# Patient Record
Sex: Female | Born: 1945
Health system: Southern US, Community
[De-identification: ages and names within clinical notes are randomized; demographics above are authoritative.]

## PROBLEM LIST (undated history)

## (undated) DIAGNOSIS — F329 Major depressive disorder, single episode, unspecified: Secondary | ICD-10-CM

## (undated) DIAGNOSIS — T4145XA Adverse effect of unspecified anesthetic, initial encounter: Secondary | ICD-10-CM

## (undated) DIAGNOSIS — R51 Headache: Secondary | ICD-10-CM

## (undated) DIAGNOSIS — C50919 Malignant neoplasm of unspecified site of unspecified female breast: Secondary | ICD-10-CM

## (undated) DIAGNOSIS — S022XXA Fracture of nasal bones, initial encounter for closed fracture: Secondary | ICD-10-CM

## (undated) DIAGNOSIS — W19XXXA Unspecified fall, initial encounter: Secondary | ICD-10-CM

## (undated) DIAGNOSIS — F32A Depression, unspecified: Secondary | ICD-10-CM

## (undated) DIAGNOSIS — T7840XA Allergy, unspecified, initial encounter: Secondary | ICD-10-CM

## (undated) DIAGNOSIS — A90 Dengue fever [classical dengue]: Secondary | ICD-10-CM

## (undated) DIAGNOSIS — R112 Nausea with vomiting, unspecified: Secondary | ICD-10-CM

## (undated) DIAGNOSIS — I10 Essential (primary) hypertension: Secondary | ICD-10-CM

## (undated) DIAGNOSIS — B54 Unspecified malaria: Secondary | ICD-10-CM

## (undated) DIAGNOSIS — F419 Anxiety disorder, unspecified: Secondary | ICD-10-CM

## (undated) DIAGNOSIS — K219 Gastro-esophageal reflux disease without esophagitis: Secondary | ICD-10-CM

## (undated) DIAGNOSIS — A0100 Typhoid fever, unspecified: Secondary | ICD-10-CM

## (undated) DIAGNOSIS — S069X9A Unspecified intracranial injury with loss of consciousness of unspecified duration, initial encounter: Secondary | ICD-10-CM

## (undated) DIAGNOSIS — R519 Headache, unspecified: Secondary | ICD-10-CM

## (undated) DIAGNOSIS — C50411 Malignant neoplasm of upper-outer quadrant of right female breast: Secondary | ICD-10-CM

## (undated) DIAGNOSIS — S060X9A Concussion with loss of consciousness of unspecified duration, initial encounter: Secondary | ICD-10-CM

## (undated) DIAGNOSIS — Z9889 Other specified postprocedural states: Secondary | ICD-10-CM

## (undated) DIAGNOSIS — S0990XA Unspecified injury of head, initial encounter: Secondary | ICD-10-CM

## (undated) DIAGNOSIS — T8859XA Other complications of anesthesia, initial encounter: Secondary | ICD-10-CM

## (undated) HISTORY — PX: NERVE SURGERY: SHX1016

## (undated) HISTORY — PX: TONSILLECTOMY: SUR1361

## (undated) HISTORY — DX: Anxiety disorder, unspecified: F41.9

## (undated) HISTORY — DX: Typhoid fever, unspecified: A01.00

## (undated) HISTORY — DX: Unspecified malaria: B54

## (undated) HISTORY — PX: OTHER SURGICAL HISTORY: SHX169

## (undated) HISTORY — DX: Major depressive disorder, single episode, unspecified: F32.9

## (undated) HISTORY — DX: Dengue fever (classical dengue): A90

## (undated) HISTORY — DX: Malignant neoplasm of upper-outer quadrant of right female breast: C50.411

## (undated) HISTORY — DX: Essential (primary) hypertension: I10

## (undated) HISTORY — PX: COLONOSCOPY W/ BIOPSIES AND POLYPECTOMY: SHX1376

## (undated) HISTORY — DX: Depression, unspecified: F32.A

---

## 1984-08-13 DIAGNOSIS — S060XAA Concussion with loss of consciousness status unknown, initial encounter: Secondary | ICD-10-CM

## 1984-08-13 DIAGNOSIS — S060X9A Concussion with loss of consciousness of unspecified duration, initial encounter: Secondary | ICD-10-CM

## 1984-08-13 HISTORY — DX: Concussion with loss of consciousness of unspecified duration, initial encounter: S06.0X9A

## 1984-08-13 HISTORY — DX: Concussion with loss of consciousness status unknown, initial encounter: S06.0XAA

## 1993-08-13 DIAGNOSIS — S0990XA Unspecified injury of head, initial encounter: Secondary | ICD-10-CM

## 1993-08-13 HISTORY — DX: Unspecified injury of head, initial encounter: S09.90XA

## 1998-08-13 DIAGNOSIS — S069X9A Unspecified intracranial injury with loss of consciousness of unspecified duration, initial encounter: Secondary | ICD-10-CM

## 1998-08-13 HISTORY — DX: Unspecified intracranial injury with loss of consciousness of unspecified duration, initial encounter: S06.9X9A

## 2000-10-29 ENCOUNTER — Encounter: Admission: RE | Admit: 2000-10-29 | Discharge: 2000-10-29 | Payer: Self-pay | Admitting: Family Medicine

## 2000-10-29 ENCOUNTER — Encounter: Payer: Self-pay | Admitting: Family Medicine

## 2004-07-07 ENCOUNTER — Emergency Department (HOSPITAL_COMMUNITY): Admission: EM | Admit: 2004-07-07 | Discharge: 2004-07-07 | Payer: Self-pay | Admitting: Family Medicine

## 2004-11-22 ENCOUNTER — Encounter: Admission: RE | Admit: 2004-11-22 | Discharge: 2004-11-22 | Payer: Self-pay | Admitting: Sports Medicine

## 2004-11-23 ENCOUNTER — Encounter: Admission: RE | Admit: 2004-11-23 | Discharge: 2004-11-23 | Payer: Self-pay | Admitting: Sports Medicine

## 2004-11-24 ENCOUNTER — Encounter: Admission: RE | Admit: 2004-11-24 | Discharge: 2004-11-24 | Payer: Self-pay | Admitting: Sports Medicine

## 2005-12-25 ENCOUNTER — Encounter: Admission: RE | Admit: 2005-12-25 | Discharge: 2005-12-25 | Payer: Self-pay | Admitting: Family Medicine

## 2012-11-27 ENCOUNTER — Ambulatory Visit (INDEPENDENT_AMBULATORY_CARE_PROVIDER_SITE_OTHER): Payer: Medicare Other | Admitting: Emergency Medicine

## 2012-11-27 VITALS — BP 144/72 | HR 82 | Temp 99.0°F | Resp 16 | Ht 65.5 in | Wt 169.0 lb

## 2012-11-27 DIAGNOSIS — J018 Other acute sinusitis: Secondary | ICD-10-CM

## 2012-11-27 DIAGNOSIS — J209 Acute bronchitis, unspecified: Secondary | ICD-10-CM

## 2012-11-27 MED ORDER — AMOXICILLIN-POT CLAVULANATE 875-125 MG PO TABS: 1.0000 | ORAL_TABLET | Freq: Two times a day (BID) | ORAL | Status: DC

## 2012-11-27 MED ORDER — PSEUDOEPHEDRINE-GUAIFENESIN ER 60-600 MG PO TB12: 1.0000 | ORAL_TABLET | Freq: Two times a day (BID) | ORAL | Status: DC

## 2012-11-27 NOTE — Progress Notes (Signed)
[facility] [address] [place] [address] [phone]  Date:  11/27/2012   Name:  Jessica Roberts   DOB:  1945/09/02   MRN:  [id number]  PCP:  No PCP Per Patient    Chief Complaint: URI   History of Present Illness:  Jessica Roberts is a 67 y.o. very pleasant female patient who presents with the following:  Ill for a week with purulent nasal drainage and nasal congestion.  No fever or chills.  Malaise and myalgia worse over past several days.  No wheezing or shortness of breath.  Has a nonproductive cough.  No nausea or vomiting. No stool change.  No improvement with over the counter medications or other home remedies. Denies other complaint or health concern today.   There is no problem list on file for this patient.   Past Medical History  Diagnosis Date  . Anxiety   . Hypertension     No past surgical history on file.  History  Substance Use Topics  . Smoking status: Not on file  . Smokeless tobacco: Not on file  . Alcohol Use: Not on file    Family History  Problem Relation Age of Onset  . Hypertension Mother   . Thyroid disease Mother   . Emphysema Father   . Diabetes Maternal Grandmother   . Heart disease Paternal Grandmother   . Alcohol abuse Paternal Grandfather     Allergies  Allergen Reactions  . Codeine Other (See Comments)    Nervous system    Medication list has been reviewed and updated.  No current outpatient prescriptions on file prior to visit.   No current facility-administered medications on file prior to visit.    Review of Systems:  As per HPI, otherwise negative.    Physical Examination: There were no vitals filed for this visit. There were no vitals filed for this visit. There is no height or weight on file to calculate BMI. Ideal Body Weight:    GEN: WDWN, NAD, Non-toxic, A & O x 3 HEENT: Atraumatic, Normocephalic. Neck supple. No masses, No LAD. Ears and Nose: No external deformity. CV:  RRR, No M/G/R. No JVD. No thrill. No extra heart sounds. PULM: CTA B, no wheezes, crackles, rhonchi. No retractions. No resp. distress. No accessory muscle use. ABD: S, NT, ND, +BS. No rebound. No HSM. EXTR: No c/c/e NEURO Normal gait.  PSYCH: Normally interactive. Conversant. Not depressed or anxious appearing.  Calm demeanor.    Assessment and Plan: Sinusitis Bronchitis augmentin mucinex d tussionex   Signed,  Phillips Odor, MD

## 2012-11-27 NOTE — Patient Instructions (Addendum)
aBronchitis Bronchitis is the body's way of reacting to injury and/or infection (inflammation) of the bronchi. Bronchi are the air tubes that extend from the windpipe into the lungs. If the inflammation becomes severe, it may cause shortness of breath. CAUSES  Inflammation may be caused by:  A virus.  Germs (bacteria).  Dust.  Allergens.  Pollutants and many other irritants. The cells lining the bronchial tree are covered with tiny hairs (cilia). These constantly beat upward, away from the lungs, toward the mouth. This keeps the lungs free of pollutants. When these cells become too irritated and are unable to do their job, mucus begins to develop. This causes the characteristic cough of bronchitis. The cough clears the lungs when the cilia are unable to do their job. Without either of these protective mechanisms, the mucus would settle in the lungs. Then you would develop pneumonia. Smoking is a common cause of bronchitis and can contribute to pneumonia. Stopping this habit is the single most important thing you can do to help yourself. TREATMENT   Your caregiver may prescribe an antibiotic if the cough is caused by bacteria. Also, medicines that open up your airways make it easier to breathe. Your caregiver may also recommend or prescribe an expectorant. It will loosen the mucus to be coughed up. Only take over-the-counter or prescription medicines for pain, discomfort, or fever as directed by your caregiver.  Removing whatever causes the problem (smoking, for example) is critical to preventing the problem from getting worse.  Cough suppressants may be prescribed for relief of cough symptoms.  Inhaled medicines may be prescribed to help with symptoms now and to help prevent problems from returning.  For those with recurrent (chronic) bronchitis, there may be a need for steroid medicines. SEEK IMMEDIATE MEDICAL CARE IF:   During treatment, you develop more pus-like mucus (purulent  sputum).  You have a fever.  Your baby is older than 3 months with a rectal temperature of 102 F (38.9 C) or higher.  Your baby is 54 months old or younger with a rectal temperature of 100.4 F (38 C) or higher.  You become progressively more ill.  You have increased difficulty breathing, wheezing, or shortness of breath. It is necessary to seek immediate medical care if you are elderly or sick from any other disease. MAKE SURE YOU:   Understand these instructions.  Will watch your condition.  Will get help right away if you are not doing well or get worse. Document Released: 07/30/2005 Document Revised: 10/22/2011 Document Reviewed: 06/08/2008 [facility] Patient Information 2013 [place], [place].

## 2013-06-26 ENCOUNTER — Emergency Department (HOSPITAL_COMMUNITY): Payer: Medicare Other

## 2013-06-26 ENCOUNTER — Emergency Department (HOSPITAL_COMMUNITY)
Admission: EM | Admit: 2013-06-26 | Discharge: 2013-06-26 | Disposition: A | Discharge status: Home / Self Care | Payer: Medicare Other | Attending: Emergency Medicine | Admitting: Emergency Medicine

## 2013-06-26 ENCOUNTER — Encounter (HOSPITAL_COMMUNITY): Payer: Self-pay | Admitting: Emergency Medicine

## 2013-06-26 DIAGNOSIS — I10 Essential (primary) hypertension: Secondary | ICD-10-CM | POA: Insufficient documentation

## 2013-06-26 DIAGNOSIS — F411 Generalized anxiety disorder: Secondary | ICD-10-CM | POA: Insufficient documentation

## 2013-06-26 DIAGNOSIS — IMO0002 Reserved for concepts with insufficient information to code with codable children: Secondary | ICD-10-CM | POA: Insufficient documentation

## 2013-06-26 DIAGNOSIS — S022XXA Fracture of nasal bones, initial encounter for closed fracture: Secondary | ICD-10-CM

## 2013-06-26 DIAGNOSIS — Y9289 Other specified places as the place of occurrence of the external cause: Secondary | ICD-10-CM | POA: Insufficient documentation

## 2013-06-26 DIAGNOSIS — R296 Repeated falls: Secondary | ICD-10-CM | POA: Insufficient documentation

## 2013-06-26 DIAGNOSIS — W19XXXA Unspecified fall, initial encounter: Secondary | ICD-10-CM

## 2013-06-26 DIAGNOSIS — S0081XA Abrasion of other part of head, initial encounter: Secondary | ICD-10-CM

## 2013-06-26 DIAGNOSIS — Z79899 Other long term (current) drug therapy: Secondary | ICD-10-CM | POA: Insufficient documentation

## 2013-06-26 DIAGNOSIS — S0990XA Unspecified injury of head, initial encounter: Secondary | ICD-10-CM | POA: Insufficient documentation

## 2013-06-26 DIAGNOSIS — S8990XA Unspecified injury of unspecified lower leg, initial encounter: Secondary | ICD-10-CM | POA: Insufficient documentation

## 2013-06-26 DIAGNOSIS — S99919A Unspecified injury of unspecified ankle, initial encounter: Secondary | ICD-10-CM | POA: Insufficient documentation

## 2013-06-26 DIAGNOSIS — Z23 Encounter for immunization: Secondary | ICD-10-CM | POA: Insufficient documentation

## 2013-06-26 DIAGNOSIS — Y9302 Activity, running: Secondary | ICD-10-CM | POA: Insufficient documentation

## 2013-06-26 MED ORDER — TETANUS-DIPHTH-ACELL PERTUSSIS 5-2.5-18.5 LF-MCG/0.5 IM SUSP
0.5000 mL | Freq: Once | INTRAMUSCULAR | Status: AC
  Administered 2013-06-26: 0.5 mL via INTRAMUSCULAR
  Filled 2013-06-26: qty 0.5

## 2013-06-26 MED ORDER — HYDROCODONE-ACETAMINOPHEN 5-325 MG PO TABS: 1.0000 | ORAL_TABLET | Freq: Four times a day (QID) | ORAL | Status: DC | PRN

## 2013-06-26 MED ORDER — HYDROCODONE-ACETAMINOPHEN 5-325 MG PO TABS
2.0000 | ORAL_TABLET | Freq: Once | ORAL | Status: AC
  Administered 2013-06-26: 1 via ORAL
  Filled 2013-06-26: qty 2

## 2013-06-26 NOTE — Progress Notes (Signed)
[facility] provided pt with a list of primary care resources and a [facility] Orange Card application.  °

## 2013-06-26 NOTE — ED Notes (Signed)
Pt transported to Xray. 

## 2013-06-26 NOTE — ED Notes (Signed)
Pt returned from X-ray.  

## 2013-06-26 NOTE — ED Provider Notes (Signed)
CSN: [id number]     Arrival date & time 06/26/13  1412 History   First MD Initiated Contact with Patient 06/26/13 1504     Chief Complaint  Patient presents with  . Fall  . Facial Injury   (Consider location/radiation/quality/duration/timing/severity/associated sxs/prior Treatment) Patient is a 67 y.o. female presenting with fall and facial injury. The history is provided by the patient.  Fall This is a new problem. The current episode started 1 to 2 hours ago. Episode frequency: once. The problem has not changed since onset.Associated symptoms include headaches. Pertinent negatives include no chest pain, no abdominal pain and no shortness of breath. Nothing aggravates the symptoms. Nothing relieves the symptoms. She has tried nothing for the symptoms.  Facial Injury Mechanism of injury:  Fall Location:  Nose and forehead Time since incident:  1 hour Pain details:    Quality:  Aching   Severity:  Moderate   Duration:  1 hour   Timing:  Constant   Progression:  Unchanged Chronicity:  New Foreign body present:  No foreign bodies Relieved by:  Nothing Associated symptoms: headaches   Associated symptoms: no altered mental status, no ear pain, no epistaxis, no neck pain and no vomiting     Past Medical History  Diagnosis Date  . Anxiety   . Hypertension    Past Surgical History  Procedure Laterality Date  . Surgical procedure for endometriosis     Family History  Problem Relation Age of Onset  . Hypertension Mother   . Thyroid disease Mother   . Emphysema Father   . Diabetes Maternal Grandmother   . Heart disease Paternal Grandmother   . Alcohol abuse Paternal Grandfather    History  Substance Use Topics  . Smoking status: Never Smoker   . Smokeless tobacco: Not on file  . Alcohol Use: 2.4 oz/week    4 Glasses of wine per week   OB History   Grav Para Term Preterm Abortions TAB SAB Ect Mult Living                 Review of Systems  Constitutional: Negative for  fever.  HENT: Negative for ear pain and nosebleeds.   Respiratory: Negative for cough and shortness of breath.   Cardiovascular: Negative for chest pain.  Gastrointestinal: Negative for vomiting and abdominal pain.  Musculoskeletal: Negative for neck pain.  Neurological: Positive for headaches.  All other systems reviewed and are negative.    Allergies  Codeine  Home Medications   Current Outpatient Rx  Name  Route  Sig  Dispense  Refill  . acetaminophen (TYLENOL) 500 MG tablet   Oral   Take 500 mg by mouth every 6 (six) hours as needed for moderate pain.          Marland Kitchen amLODipine (NORVASC) 2.5 MG tablet   Oral   Take 2.5 mg by mouth daily.         . diazepam (VALIUM) 5 MG tablet   Oral   Take 2.5 mg by mouth as needed for anxiety.         Marland Kitchen estradiol (ESTRACE) 0.5 MG tablet   Oral   Take 0.5 mg by mouth daily.         Marland Kitchen ibuprofen (ADVIL,MOTRIN) 200 MG tablet   Oral   Take 200 mg by mouth every 6 (six) hours as needed for moderate pain.          . medroxyPROGESTERone (PROVERA) 2.5 MG tablet   Oral  Take 5 mg by mouth daily.         . Multiple Vitamins-Minerals (AIRBORNE) CHEW   Oral   Chew 1 tablet by mouth daily as needed (for immune support).         . Multiple Vitamins-Minerals (ZINC PO)   Oral   Take 1 tablet by mouth daily as needed (for relaxation).         . naproxen sodium (ANAPROX) 220 MG tablet   Oral   Take 220 mg by mouth daily as needed (for pain).          Marland Kitchen sertraline (ZOLOFT) 100 MG tablet   Oral   Take 150 mg by mouth daily.         Marland Kitchen HYDROcodone-acetaminophen (NORCO/VICODIN) 5-325 MG per tablet   Oral   Take 1 tablet by mouth every 6 (six) hours as needed for moderate pain.   30 tablet   0    BP 133/67  Pulse 78  Temp(Src) 97.9 F (36.6 C) (Oral)  Resp 18  Ht 5' 6.5" (1.689 m)  Wt 169 lb (76.658 kg)  BMI 26.87 kg/m2  SpO2 96% Physical Exam  Nursing note and vitals reviewed. Constitutional: She is oriented  to person, place, and time. She appears well-developed and well-nourished. No distress.  HENT:  Head: Normocephalic.    Nose: Sinus tenderness present. No mucosal edema, rhinorrhea, nasal deformity, septal deviation or nasal septal hematoma. Right sinus exhibits no maxillary sinus tenderness and no frontal sinus tenderness. Left sinus exhibits maxillary sinus tenderness and frontal sinus tenderness.    Forehead abrasion Large nose abrasion  Eyes: EOM are normal. Pupils are equal, round, and reactive to light.  Neck: Normal range of motion. Neck supple.  Cardiovascular: Normal rate and regular rhythm.  Exam reveals no friction rub.   No murmur heard. Pulmonary/Chest: Effort normal and breath sounds normal. No respiratory distress. She has no wheezes. She has no rales.  Abdominal: Soft. She exhibits no distension. There is no tenderness. There is no rebound.  Musculoskeletal: Normal range of motion. She exhibits no edema.       Right knee: Tenderness (patella with mild abrasion) found.       Left knee: Tenderness (with mild patellar abrasion) found.       Cervical back: She exhibits no bony tenderness.       Thoracic back: She exhibits no bony tenderness.  Neurological: She is alert and oriented to person, place, and time.  Skin: She is not diaphoretic.    ED Course  Procedures (including critical care time) Labs Review Labs Reviewed - No data to display Imaging Review Ct Head Wo Contrast  06/26/2013   CLINICAL DATA:  Facial trauma status post fall.  EXAM: CT HEAD WITHOUT CONTRAST  CT MAXILLOFACIAL WITHOUT CONTRAST  TECHNIQUE: Multidetector CT imaging of the head and maxillofacial structures were performed using the standard protocol without intravenous contrast. Multiplanar CT image reconstructions of the maxillofacial structures were also generated.  COMPARISON:  None.  FINDINGS: CT HEAD FINDINGS  There is no evidence of mass effect, midline shift, or extra-axial fluid collections.  There is no evidence of a space-occupying lesion or intracranial hemorrhage. There is no evidence of a cortical-based area of acute infarction. There is generalized cerebral atrophy. There is periventricular white matter low attenuation likely secondary to microangiopathy.  The ventricles and sulci are appropriate for the patient's age. The basal cisterns are patent.  Visualized portions of the orbits are unremarkable. There is a mucous  retention cyst in the right maxillary sinus. Cerebrovascular atherosclerotic calcifications are noted.  The osseous structures are unremarkable.  CT MAXILLOFACIAL FINDINGS  The globes are intact. The orbital walls are intact. The orbital floors are intact. The maxilla is intact. The mandible is intact. The zygomatic arches are intact. The nasal septum is deviated towards the left. There are bilateral comminuted nasal bone fractures. The temporomandibular joints are normal.  There is a right maxillary sinus mucous retention cyst. The visualized portions of the mastoid sinuses are well aerated.  IMPRESSION: 1.  No acute intracranial pathology.  2.  Bilateral mildly comminuted nasal bone fractures.   Electronically Signed   By: Elige Ko   On: 06/26/2013 16:34   Dg Knee Complete 4 Views Left  06/26/2013   CLINICAL DATA:  Bilateral knee pain and abrasions  EXAM: LEFT KNEE - COMPLETE 4+ VIEW  COMPARISON:  None.  FINDINGS: There is no evidence of fracture, dislocation, or joint effusion. There is no evidence of arthropathy or other focal bone abnormality. Soft tissues are unremarkable.  IMPRESSION: No acute osseous injury of the left knee.   Electronically Signed   By: Elige Ko   On: 06/26/2013 16:23   Dg Knee Complete 4 Views Right  06/26/2013   CLINICAL DATA:  Bilateral knee pain and abrasions status post fall  EXAM: RIGHT KNEE - COMPLETE 4+ VIEW  COMPARISON:  None.  FINDINGS: There is no evidence of fracture, dislocation, or joint effusion. There is no evidence of  arthropathy or other focal bone abnormality. Soft tissues are unremarkable.  IMPRESSION: No acute osseous injury of the right knee.   Electronically Signed   By: Elige Ko   On: 06/26/2013 16:24   Ct Maxillofacial Wo Cm  06/26/2013   CLINICAL DATA:  Facial trauma status post fall.  EXAM: CT HEAD WITHOUT CONTRAST  CT MAXILLOFACIAL WITHOUT CONTRAST  TECHNIQUE: Multidetector CT imaging of the head and maxillofacial structures were performed using the standard protocol without intravenous contrast. Multiplanar CT image reconstructions of the maxillofacial structures were also generated.  COMPARISON:  None.  FINDINGS: CT HEAD FINDINGS  There is no evidence of mass effect, midline shift, or extra-axial fluid collections. There is no evidence of a space-occupying lesion or intracranial hemorrhage. There is no evidence of a cortical-based area of acute infarction. There is generalized cerebral atrophy. There is periventricular white matter low attenuation likely secondary to microangiopathy.  The ventricles and sulci are appropriate for the patient's age. The basal cisterns are patent.  Visualized portions of the orbits are unremarkable. There is a mucous retention cyst in the right maxillary sinus. Cerebrovascular atherosclerotic calcifications are noted.  The osseous structures are unremarkable.  CT MAXILLOFACIAL FINDINGS  The globes are intact. The orbital walls are intact. The orbital floors are intact. The maxilla is intact. The mandible is intact. The zygomatic arches are intact. The nasal septum is deviated towards the left. There are bilateral comminuted nasal bone fractures. The temporomandibular joints are normal.  There is a right maxillary sinus mucous retention cyst. The visualized portions of the mastoid sinuses are well aerated.  IMPRESSION: 1.  No acute intracranial pathology.  2.  Bilateral mildly comminuted nasal bone fractures.   Electronically Signed   By: Elige Ko   On: 06/26/2013 16:34     EKG Interpretation   None       MDM   1. Nasal bone fracture, closed, initial encounter   2. Fall, initial encounter   3. Facial  abrasion, initial encounter    4F s/p fall. Mechanical fall, slipped while running downhill. Landed on her face. No LOC, no N/V. Not on anticoagulants. Patient has large abrasion over anterior and L side of nose. Small divot at bridge of nose, no laceration. 3 cm x 1.5 cm abrasion in L center forehead. AFVSS here. Mild abrasions on anterior knees, otherwise no injuries. Lungs clear, no chest, abdomen, pelvis tenderness. Tetanus updated, pain meds given. Will CT Head, Face and xray bilateral knees. Scans show bilateral comminuted nasal fractures, otherwise normal.    Dagmar Hait, MD 06/26/13 1806

## 2013-06-26 NOTE — ED Notes (Addendum)
Pt c/o facial injury d/t a fall from running down a hill and landing on face. She has lacerations to forehead and nose. No LOC. No recent use of ASA or blood thinners.

## 2013-08-31 ENCOUNTER — Other Ambulatory Visit: Payer: Self-pay | Admitting: Family Medicine

## 2013-08-31 DIAGNOSIS — R413 Other amnesia: Secondary | ICD-10-CM

## 2013-09-04 ENCOUNTER — Ambulatory Visit
Admission: RE | Admit: 2013-09-04 | Discharge: 2013-09-04 | Disposition: A | Discharge status: Home / Self Care | Payer: Medicare Other | Source: Ambulatory Visit | Attending: Family Medicine | Admitting: Family Medicine

## 2013-09-04 DIAGNOSIS — R413 Other amnesia: Secondary | ICD-10-CM

## 2013-09-04 MED ORDER — GADOBENATE DIMEGLUMINE 529 MG/ML IV SOLN
15.0000 mL | Freq: Once | INTRAVENOUS | Status: AC | PRN
  Administered 2013-09-04: 15 mL via INTRAVENOUS

## 2013-09-16 ENCOUNTER — Telehealth: Payer: Self-pay

## 2013-09-16 ENCOUNTER — Encounter: Payer: Self-pay | Admitting: Diagnostic Neuroimaging

## 2013-09-16 ENCOUNTER — Ambulatory Visit (INDEPENDENT_AMBULATORY_CARE_PROVIDER_SITE_OTHER): Payer: Medicare Other | Admitting: Diagnostic Neuroimaging

## 2013-09-16 ENCOUNTER — Encounter (INDEPENDENT_AMBULATORY_CARE_PROVIDER_SITE_OTHER): Payer: Self-pay

## 2013-09-16 VITALS — BP 130/80 | HR 74 | Temp 97.9°F | Ht 66.0 in | Wt 182.0 lb

## 2013-09-16 DIAGNOSIS — R413 Other amnesia: Secondary | ICD-10-CM

## 2013-09-16 DIAGNOSIS — R9089 Other abnormal findings on diagnostic imaging of central nervous system: Secondary | ICD-10-CM

## 2013-09-16 DIAGNOSIS — R930 Abnormal findings on diagnostic imaging of skull and head, not elsewhere classified: Secondary | ICD-10-CM

## 2013-09-16 DIAGNOSIS — F09 Unspecified mental disorder due to known physiological condition: Secondary | ICD-10-CM

## 2013-09-16 DIAGNOSIS — R42 Dizziness and giddiness: Secondary | ICD-10-CM

## 2013-09-16 NOTE — Telephone Encounter (Signed)
Called patient twice to schedule f/u in 4 weeks. Vmail states not to leave a vmail but call back. Will try later.
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
## 2013-09-16 NOTE — Patient Instructions (Signed)
I will check additional testing. 

## 2013-09-17 LAB — PAN-ANCA
C-ANCA: <1:20 {titer}
Myeloperoxidase Ab: <9 U/mL (ref 0.0–9.0)

## 2013-09-17 LAB — VITAMIN B12: Vitamin B-12: 299 pg/mL (ref 211–946)

## 2013-09-17 LAB — ANGIOTENSIN CONVERTING ENZYME: Angio Convert Enzyme: 40 U/L (ref 14–82)

## 2013-09-21 NOTE — Telephone Encounter (Signed)
Spoke to patient. Sched appt per work/school schedule.

## 2013-09-22 ENCOUNTER — Other Ambulatory Visit: Payer: Medicare Other

## 2013-09-22 ENCOUNTER — Inpatient Hospital Stay: Admission: RE | Admit: 2013-09-22 | Payer: Medicare Other | Source: Ambulatory Visit

## 2013-09-25 ENCOUNTER — Ambulatory Visit (INDEPENDENT_AMBULATORY_CARE_PROVIDER_SITE_OTHER): Payer: Medicare Other

## 2013-09-25 DIAGNOSIS — R413 Other amnesia: Secondary | ICD-10-CM

## 2013-09-25 DIAGNOSIS — H5310 Unspecified subjective visual disturbances: Secondary | ICD-10-CM

## 2013-09-25 DIAGNOSIS — R9089 Other abnormal findings on diagnostic imaging of central nervous system: Secondary | ICD-10-CM

## 2013-09-25 DIAGNOSIS — F09 Unspecified mental disorder due to known physiological condition: Secondary | ICD-10-CM

## 2013-09-25 DIAGNOSIS — R42 Dizziness and giddiness: Secondary | ICD-10-CM

## 2013-09-28 ENCOUNTER — Inpatient Hospital Stay: Admission: RE | Admit: 2013-09-28 | Payer: Medicare Other | Source: Ambulatory Visit

## 2013-09-28 ENCOUNTER — Other Ambulatory Visit: Payer: Medicare Other

## 2013-09-29 NOTE — Procedures (Signed)
   [facility]  VEP (VISUAL EVOKED POTENTIAL) REPORT   STUDY DATE: 09/25/13 PATIENT NAME: Jessica Roberts DOB: Dec 22, 1945 MRN: [id number]  ORDERING CLINICIAN: Andrey Spearman, MD   TECHNOLOGIST: Laretta Alstrom TECHNIQUE: The visual evoked potential test was performed using 32 x 32 check sizes with full pattern reversal. CLINICAL INFORMATION: 68 year old female with abnormal MRI.  FINDINGS: The visual acuity was 20/30 OD and 20/30 OS.  There are well formed evoked potential wave forms bilaterally.   P100 latency with right eye stimulation: 102 ms.   P100 latency with left eye stimulation: 103 ms.  The amplitudes for the P100 waveforms were also within normal limits bilaterally.   IMPRESSION: This visual evoked potential study is normal. No definite evidence of conduction problems of the visual pathways at this time.   INTERPRETING PHYSICIAN:  Penni Bombard, MD Certified in Neurology, Neurophysiology and Neuroimaging  [facility] [address] [place], [place] [address] [phone]

## 2013-10-08 ENCOUNTER — Ambulatory Visit
Admission: RE | Admit: 2013-10-08 | Discharge: 2013-10-08 | Disposition: A | Discharge status: Home / Self Care | Payer: Medicare Other | Source: Ambulatory Visit | Attending: Diagnostic Neuroimaging | Admitting: Diagnostic Neuroimaging

## 2013-10-08 DIAGNOSIS — R42 Dizziness and giddiness: Secondary | ICD-10-CM

## 2013-10-08 DIAGNOSIS — R413 Other amnesia: Secondary | ICD-10-CM

## 2013-10-08 DIAGNOSIS — F09 Unspecified mental disorder due to known physiological condition: Secondary | ICD-10-CM

## 2013-10-08 DIAGNOSIS — R930 Abnormal findings on diagnostic imaging of skull and head, not elsewhere classified: Secondary | ICD-10-CM

## 2013-10-08 DIAGNOSIS — R9089 Other abnormal findings on diagnostic imaging of central nervous system: Secondary | ICD-10-CM

## 2013-10-08 MED ORDER — GADOBENATE DIMEGLUMINE 529 MG/ML IV SOLN
15.0000 mL | Freq: Once | INTRAVENOUS | Status: AC | PRN
  Administered 2013-10-08: 15 mL via INTRAVENOUS

## 2013-10-14 ENCOUNTER — Encounter: Payer: Self-pay | Admitting: Diagnostic Neuroimaging

## 2013-10-14 ENCOUNTER — Ambulatory Visit (INDEPENDENT_AMBULATORY_CARE_PROVIDER_SITE_OTHER): Payer: Medicare Other | Admitting: Diagnostic Neuroimaging

## 2013-10-14 VITALS — BP 121/74 | HR 92 | Ht 66.0 in

## 2013-10-14 DIAGNOSIS — R42 Dizziness and giddiness: Secondary | ICD-10-CM

## 2013-10-14 DIAGNOSIS — R930 Abnormal findings on diagnostic imaging of skull and head, not elsewhere classified: Secondary | ICD-10-CM

## 2013-10-14 DIAGNOSIS — R413 Other amnesia: Secondary | ICD-10-CM

## 2013-10-14 DIAGNOSIS — R9089 Other abnormal findings on diagnostic imaging of central nervous system: Secondary | ICD-10-CM
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
## 2013-10-14 NOTE — Patient Instructions (Signed)
I will setup lumbar puncture.

## 2013-10-20 ENCOUNTER — Ambulatory Visit
Admission: RE | Admit: 2013-10-20 | Discharge: 2013-10-20 | Disposition: A | Discharge status: Home / Self Care | Payer: Medicare Other | Source: Ambulatory Visit | Attending: Diagnostic Neuroimaging | Admitting: Diagnostic Neuroimaging

## 2013-10-20 VITALS — BP 141/68 | HR 69

## 2013-10-20 DIAGNOSIS — R42 Dizziness and giddiness: Secondary | ICD-10-CM

## 2013-10-20 DIAGNOSIS — R9089 Other abnormal findings on diagnostic imaging of central nervous system: Secondary | ICD-10-CM

## 2013-10-20 DIAGNOSIS — R413 Other amnesia: Secondary | ICD-10-CM

## 2013-10-20 DIAGNOSIS — R839 Unspecified abnormal finding in cerebrospinal fluid: Secondary | ICD-10-CM

## 2013-10-20 LAB — CSF CELL COUNT WITH DIFFERENTIAL
RBC COUNT CSF: 0 uL
Tube #: 1
WBC, CSF: 0 cu mm (ref 0–5)

## 2013-10-20 LAB — PROTEIN, CSF: Total Protein, CSF: 40 mg/dL (ref 15–45)

## 2013-10-20 LAB — GLUCOSE, CSF: Glucose, CSF: 57 mg/dL (ref 43–76)

## 2013-10-20 MED ORDER — IOHEXOL 180 MG/ML  SOLN: 1.0000 mL | Freq: Once | INTRAMUSCULAR | Status: AC | PRN

## 2013-10-20 MED ORDER — METHYLPREDNISOLONE ACETATE 40 MG/ML INJ SUSP (RADIOLOG: 120.0000 mg | Freq: Once | INTRAMUSCULAR | Status: DC

## 2013-10-20 NOTE — Discharge Instructions (Signed)

## 2013-10-20 NOTE — Progress Notes (Signed)
Blood drawn to go with spinal fluid. 2 vials obtained from right AC. Site is unremarkable and discharge instructions explained.

## 2013-10-21 LAB — GRAM STAIN
GRAM STAIN: NONE SEEN
Gram Stain: NONE SEEN
Gram Stain: NONE SEEN

## 2013-10-22 LAB — ANGIOTENSIN CONVERTING ENZYME, CSF: ACE, CSF: 5 U/L (ref <=15)

## 2013-10-23 LAB — CSF CULTURE: ORGANISM ID, BACTERIA: NO GROWTH

## 2013-10-23 LAB — CSF CULTURE W GRAM STAIN
Gram Stain: NONE SEEN
Gram Stain: NONE SEEN

## 2013-10-27 ENCOUNTER — Telehealth: Payer: Self-pay | Admitting: Diagnostic Neuroimaging

## 2013-10-27 LAB — HSV(HERPES SMPLX VRS)ABS-I+II(IGG)-CSF

## 2013-10-27 NOTE — Telephone Encounter (Signed)
Called pt to advise of a cancellation, scheduled pt for 10/29/13.

## 2013-10-27 NOTE — Telephone Encounter (Signed)
Pt is calling needing to get an apt soon to discuss her test results and her diagnosis from Lumbar test and other test performed on pt.  Please call pt concerning this matter.

## 2013-10-28 LAB — OLIGOCLONAL BANDS, CSF + SERM

## 2013-10-29 ENCOUNTER — Encounter (INDEPENDENT_AMBULATORY_CARE_PROVIDER_SITE_OTHER): Payer: Self-pay

## 2013-10-29 ENCOUNTER — Encounter: Payer: Self-pay | Admitting: Diagnostic Neuroimaging

## 2013-10-29 ENCOUNTER — Ambulatory Visit (INDEPENDENT_AMBULATORY_CARE_PROVIDER_SITE_OTHER): Payer: Medicare Other | Admitting: Diagnostic Neuroimaging

## 2013-10-29 VITALS — BP 122/74 | HR 75 | Temp 97.6°F | Ht 66.0 in | Wt 181.5 lb

## 2013-10-29 DIAGNOSIS — R413 Other amnesia: Secondary | ICD-10-CM

## 2013-10-29 DIAGNOSIS — R42 Dizziness and giddiness: Secondary | ICD-10-CM
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
## 2013-10-29 NOTE — Patient Instructions (Signed)
Keep up the good work. I think you are on the right track.

## 2014-06-08 ENCOUNTER — Other Ambulatory Visit: Payer: Self-pay | Admitting: Family Medicine

## 2014-06-08 DIAGNOSIS — Z1239 Encounter for other screening for malignant neoplasm of breast: Secondary | ICD-10-CM

## 2014-11-01 ENCOUNTER — Encounter: Payer: Self-pay | Admitting: Diagnostic Neuroimaging

## 2014-11-01 ENCOUNTER — Ambulatory Visit (INDEPENDENT_AMBULATORY_CARE_PROVIDER_SITE_OTHER): Payer: Medicare Other | Admitting: Diagnostic Neuroimaging

## 2014-11-01 VITALS — BP 120/73 | HR 82 | Temp 97.1°F | Ht 66.0 in | Wt 178.6 lb

## 2014-11-01 DIAGNOSIS — R42 Dizziness and giddiness: Secondary | ICD-10-CM
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
## 2014-11-01 NOTE — Patient Instructions (Signed)
Follow up as needed

## 2015-02-09 ENCOUNTER — Other Ambulatory Visit: Payer: Self-pay | Admitting: Family Medicine

## 2015-02-09 DIAGNOSIS — Z803 Family history of malignant neoplasm of breast: Secondary | ICD-10-CM

## 2015-02-09 DIAGNOSIS — Z1239 Encounter for other screening for malignant neoplasm of breast: Secondary | ICD-10-CM

## 2015-03-14 DIAGNOSIS — Z853 Personal history of malignant neoplasm of breast: Secondary | ICD-10-CM | POA: Insufficient documentation

## 2015-03-21 ENCOUNTER — Ambulatory Visit
Admission: RE | Admit: 2015-03-21 | Discharge: 2015-03-21 | Disposition: A | Discharge status: Home / Self Care | Payer: Medicare Other | Source: Ambulatory Visit | Attending: Family Medicine | Admitting: Family Medicine

## 2015-03-21 DIAGNOSIS — Z1239 Encounter for other screening for malignant neoplasm of breast: Secondary | ICD-10-CM

## 2015-03-21 DIAGNOSIS — Z803 Family history of malignant neoplasm of breast: Secondary | ICD-10-CM

## 2015-03-24 ENCOUNTER — Other Ambulatory Visit: Payer: Self-pay | Admitting: Family Medicine

## 2015-03-24 DIAGNOSIS — R928 Other abnormal and inconclusive findings on diagnostic imaging of breast: Secondary | ICD-10-CM

## 2015-03-25 ENCOUNTER — Other Ambulatory Visit (HOSPITAL_COMMUNITY)
Admission: RE | Admit: 2015-03-25 | Discharge: 2015-03-25 | Disposition: A | Discharge status: Home / Self Care | Payer: Medicare Other | Source: Ambulatory Visit | Attending: Family Medicine | Admitting: Family Medicine

## 2015-03-25 ENCOUNTER — Other Ambulatory Visit: Payer: Self-pay | Admitting: Family Medicine

## 2015-03-25 DIAGNOSIS — Z124 Encounter for screening for malignant neoplasm of cervix: Secondary | ICD-10-CM | POA: Insufficient documentation

## 2015-03-29 LAB — CYTOLOGY - PAP

## 2015-04-01 ENCOUNTER — Other Ambulatory Visit: Payer: Self-pay | Admitting: Family Medicine

## 2015-04-01 ENCOUNTER — Ambulatory Visit
Admission: RE | Admit: 2015-04-01 | Discharge: 2015-04-01 | Disposition: A | Discharge status: Home / Self Care | Payer: Medicare Other | Source: Ambulatory Visit | Attending: Family Medicine | Admitting: Family Medicine

## 2015-04-01 DIAGNOSIS — R928 Other abnormal and inconclusive findings on diagnostic imaging of breast: Secondary | ICD-10-CM

## 2015-04-01 DIAGNOSIS — N6310 Unspecified lump in the right breast, unspecified quadrant: Secondary | ICD-10-CM

## 2015-04-05 ENCOUNTER — Other Ambulatory Visit: Payer: Medicare Other

## 2015-04-07 ENCOUNTER — Other Ambulatory Visit: Payer: Self-pay | Admitting: Family Medicine

## 2015-04-07 ENCOUNTER — Ambulatory Visit
Admission: RE | Admit: 2015-04-07 | Discharge: 2015-04-07 | Disposition: A | Discharge status: Home / Self Care | Payer: Medicare Other | Source: Ambulatory Visit | Attending: Family Medicine | Admitting: Family Medicine

## 2015-04-07 DIAGNOSIS — N6310 Unspecified lump in the right breast, unspecified quadrant: Secondary | ICD-10-CM

## 2015-04-07 DIAGNOSIS — C50919 Malignant neoplasm of unspecified site of unspecified female breast: Secondary | ICD-10-CM

## 2015-04-07 HISTORY — DX: Malignant neoplasm of unspecified site of unspecified female breast: C50.919

## 2015-04-11 ENCOUNTER — Encounter: Payer: Self-pay | Admitting: *Deleted

## 2015-04-11 ENCOUNTER — Telehealth: Payer: Self-pay | Admitting: *Deleted

## 2015-04-11 DIAGNOSIS — C50411 Malignant neoplasm of upper-outer quadrant of right female breast: Secondary | ICD-10-CM

## 2015-04-11 HISTORY — DX: Malignant neoplasm of upper-outer quadrant of right female breast: C50.411

## 2015-04-11 NOTE — Telephone Encounter (Signed)
Confirmed [facility] for 04/13/15 at 0800.  Instructions and contact information given.

## 2015-04-12 NOTE — Progress Notes (Signed)
[facility]  [phone] Fax:([phone]) [place] Note   Patient Care Team: Cari Caraway, MD as PCP - General (Family Medicine) Fanny Skates, MD as Consulting Physician (General Surgery) Truitt Merle, MD as Consulting Physician (Hematology) Thea Silversmith, MD as Consulting Physician (Radiation Oncology) Mauro Kaufmann, RN as Registered Nurse Rockwell Germany, RN as Registered Nurse Sylvan Cheese, NP as Nurse Practitioner (Nurse Practitioner) 04/13/2015  CHIEF COMPLAINTS/PURPOSE OF CONSULTATION:  Newly diagnosed right breast stage I ductal carcinoma  Oncology History   Breast cancer of upper-outer quadrant of right female breast   Staging form: Breast, AJCC 7th Edition     Clinical: Stage IA (T1a, N0, M0) - Unsigned       Breast cancer of upper-outer quadrant of right female breast   04/01/2015 Mammogram Diagnostic mammogram and ultrasound showed a 0.5 x 0.4 x 0.4 cm mass in the right breast 11:00 position, 5 cm from the nipple.   04/07/2015 Initial Diagnosis Breast cancer of upper-outer quadrant of right female breast   04/07/2015 Initial Biopsy Right breast upper outer quadrant mass core needle biopsy showed invasive ductal carcinoma and DCIS, grade 1.     HISTORY OF PRESENTING ILLNESS:  Jessica Roberts 69 y.o. female is here because of newly diagnosed right breast cancer. She is accompanied by her friend/roommate to our multidisciplinary clinic today.   This was discovered by screening mammogram, she did not have palpable mass, or any other new symptoms. She has been doing mammogram every year. The abnormal screening mammogram on 03/21/15 prompted a diagnostic mammogram and ultrasound on 04/01/2015, which showed 94mm mass in the right breast 10:00, biopsy of the mass showed grade 1 invasive ductal carcinoma and DCIS. She also had a cystic lesion 5 mm at 11:00, and a biopsy showed fibrocystic change with focal ADH.  She developed  pain and bruise after breast biopsy. But otherwise she feels well overall.  She has arthritis, mainly in fingers. She also has GERD, which is controlled by Nexium. 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 She is single, has no children. She has been taking estrogen and progesterone for the past 10 years, which she stopped a few days ago.  MEDICAL HISTORY:  Past Medical History  Diagnosis Date  . Anxiety   . Hypertension   . Anxiety and depression   . Breast cancer of upper-outer quadrant of right female breast 04/11/2015  . Dengue fever   . Malaria   . Typhoid   . Depression     SURGICAL HISTORY: Past Surgical History  Procedure Laterality Date  . Surgical procedure for endometriosis    . Tonsillectomy     GYN HISTORY  Menarchal: 50 LMP: 3 Contraceptive:no  HRT: yes, has been on for about 10 years  G0P0:   SOCIAL HISTORY: Social History   Social History  . Marital Status: Single    Spouse Name: N/A  . Number of Children: 0  . Years of Education: BA   Occupational History  .      [employer]   Social History Main Topics  . Smoking status: Former Smoker -- 30 years    Types: Cigarettes    Quit date: 08/13/1993  . Smokeless tobacco: Never Used  . Alcohol Use: 2.4 oz/week    4 Glasses of wine per week     Comment: moderate   . Drug Use: No  . Sexual Activity: No   Other Topics Concern  .  Not on file   Social History Narrative   Patient lives at home with her friend.   Caffeine Use: 1/2 cup daily    FAMILY HISTORY: Family History  Problem Relation Age of Onset  . Hypertension Mother   . Thyroid disease Mother   . Breast cancer Mother   . Emphysema Father   . Diabetes Maternal Grandmother   . Heart disease Paternal Grandmother   . Alcohol abuse Paternal Grandfather     ALLERGIES:  is allergic to codeine.  MEDICATIONS:  Current Outpatient Prescriptions  Medication Sig Dispense Refill  . acetaminophen (TYLENOL) 500 MG  tablet Take 500 mg by mouth every 6 (six) hours as needed for moderate pain.     Marland Kitchen aspirin-acetaminophen-caffeine (EXCEDRIN MIGRAINE) 250-250-65 MG per tablet Take 1 tablet by mouth daily.    Marland Kitchen buPROPion (WELLBUTRIN XL) 300 MG 24 hr tablet Take 300 mg by mouth every morning.  1  . diazepam (VALIUM) 5 MG tablet Take 2.5 mg by mouth as needed for anxiety.    . Melatonin 1 MG CAPS Take 1 mg by mouth at bedtime.    . Multiple Vitamins-Minerals (ZINC PO) Take 1 tablet by mouth daily as needed (for relaxation).    . sertraline (ZOLOFT) 100 MG tablet Take 100 mg by mouth daily.     . Multiple Vitamins-Minerals (AIRBORNE) CHEW Chew 1 tablet by mouth daily as needed (for immune support).     No current facility-administered medications for this visit.    REVIEW OF SYSTEMS:   Constitutional: Denies fevers, chills or abnormal night sweats Eyes: Denies blurriness of vision, double vision or watery eyes Ears, nose, mouth, throat, and face: Denies mucositis or sore throat Respiratory: Denies cough, dyspnea or wheezes Cardiovascular: Denies palpitation, chest discomfort or lower extremity swelling Gastrointestinal:  Denies nausea, heartburn or change in bowel habits Skin: Denies abnormal skin rashes Lymphatics: Denies new lymphadenopathy or easy bruising Neurological:Denies numbness, tingling or new weaknesses Behavioral/Psych: Mood is stable, no new changes  All other systems were reviewed with the patient and are negative.  PHYSICAL EXAMINATION: ECOG PERFORMANCE STATUS: 1 - Symptomatic but completely ambulatory  Filed Vitals:   04/13/15 0904  BP: 139/75  Pulse: 70  Temp: 98.4 F (36.9 C)  Resp: 18   Filed Weights   04/13/15 0904  Weight: 178 lb 11.2 oz (81.058 kg)    GENERAL:alert, no distress and comfortable SKIN: skin color, texture, turgor are normal, no rashes or significant lesions EYES: normal, conjunctiva are pink and non-injected, sclera clear OROPHARYNX:no exudate, no erythema  and lips, buccal mucosa, and tongue normal  NECK: supple, thyroid normal size, non-tender, without nodularity LYMPH:  no palpable lymphadenopathy in the cervical, axillary or inguinal LUNGS: clear to auscultation and percussion with normal breathing effort HEART: regular rate & rhythm and no murmurs and no lower extremity edema ABDOMEN:abdomen soft, non-tender and normal bowel sounds Musculoskeletal:no cyanosis of digits and no clubbing  PSYCH: alert & oriented x 3 with fluent speech NEURO: no focal motor/sensory deficits Breasts: Breast inspection showed them to be symmetrical with no nipple discharge. (+) Large ecchymosis at the biopsy site of the right breast, and the palpable small lump underneath the biopsy site. It's tender. Palpation of the left breasts and axilla revealed no obvious mass that I could appreciate.   LABORATORY DATA:  I have reviewed the data as listed Lab Results  Component Value Date   WBC 9.8 04/13/2015   HGB 13.0 04/13/2015   HCT 38.7 04/13/2015  MCV 90.6 04/13/2015   PLT 351 04/13/2015    Recent Labs  04/13/15 0831  NA 141  K 4.3  CO2 24  GLUCOSE 94  BUN 18.9  CREATININE 0.8  CALCIUM 9.8  PROT 7.2  ALBUMIN 3.7  AST 15  ALT 19  ALKPHOS 77  BILITOT 0.27    PATHOLOGY REPORT  Diagnosis 04/07/2015 1. Breast, right, needle core biopsy, upper midline - FIBROCYTIC CHANGES WITH FOCAL ATYPICAL DUCTAL HYPERPLASIA. - FIBROADENOMA. 2. Breast, right, needle core biopsy, upper outer - INVASIVE DUCTAL CARCINOMA. - DUCTAL CARCINOMA IN SITU. Microscopic Comment 1. There is focal atypical ductal hyperplasia associated with fibrocystic changes and a fibroadenoma. 2. The findings are consistent with grade 1 invasive ductal carcinoma. Breast prognostic profile will be performed.  2. PROGNOSTIC INDICATORS Results: IMMUNOHISTOCHEMICAL AND MORPHOMETRIC ANALYSIS PERFORMED MANUALLY Estrogen Receptor: 100%, POSITIVE, STRONG STAINING INTENSITY Progesterone  Receptor: 80%, POSITIVE, STONG STAINING INTENSITY Proliferation Marker Ki67: 5% Results: HER2 - NEGATIVE RATIO OF HER2/CEP17 SIGNALS 1.25 AVERAGE HER2 COPY NUMBER PER CELL 2.00  RADIOGRAPHIC STUDIES: I have personally reviewed the radiological images as listed and agreed with the findings in the report.  MM diag and US Breast Ltd Uni Left Inc Axilla 04/01/2015  IMPRESSION: Irregular hypoechoic mass within the right breast at the 11 o'clock axis, 5 cm from the nipple, measuring 0.5 x 0.4 x 0.4 cm, corresponding to the mammographic finding. This is a suspicious finding for which ultrasound-guided biopsy is recommended.  RECOMMENDATION: Ultrasound-guided biopsy of the right breast mass located at the 11 o'clock axis, 5 cm from the nipple, measuring 0.5 x 0.4 x 0.4 cm.  Ultrasound-guided biopsy is scheduled for August 23rd at 3 p.m.  I have discussed the findings and recommendations with the patient. Results were also provided in writing at the conclusion of the visit. If applicable, a reminder letter will be sent to the patient regarding the next appointment.  BI-RADS CATEGORY  4: Suspicious.   Electronically Signed   By: Franki Cabot M.D.   On: 04/01/2015 16:31   Mm Screening Breast Tomo Bilateral  03/23/2015   CLINICAL DATA:  Screening.  EXAM: DIGITAL SCREENING BILATERAL MAMMOGRAM WITH 3D TOMO WITH CAD  COMPARISON:  Previous exam(s).  ACR Breast Density Category c: The breast tissue is heterogeneously dense, which may obscure small masses.  FINDINGS: In the right breast possible mass requires further evaluation.  In the left breast possible mass requires further evaluation.  Images were processed with CAD.  IMPRESSION: Further evaluation is suggested for possible mass in the right breast.  Further evaluation is suggested for possible mass in the left breast.  RECOMMENDATION: Diagnostic mammogram and possibly ultrasound of both breasts. (Code:[id number])  The patient will be contacted regarding the  findings, and additional imaging will be scheduled.  BI-RADS CATEGORY  0: Incomplete. Need additional imaging evaluation and/or prior mammograms for comparison.   Electronically Signed   By: Lovey Newcomer M.D.   On: 03/23/2015 11:56    ASSESSMENT & PLAN:  69 year old Caucasian female, postmenopausal, was found to have a right breast cancer by screening mammogram.  1. Right breast invasive ductal carcinoma, [id number], stage IA, grade 1, ER positive, PR positive, HER-2 negative, (+) DCIS and ADH -I reviewed her imaging findings and the biopsy results in great detail. -She has stage I breast cancer, likely will be cured with computed surgical resection. She was seen by surgeon Dr. Dalbert Batman today, and will likely have lumpectomy soon.  -We reviewed the risk of cancer recurrence after  surgery. Given the ER/PR positivity, HER-2 negative, low Ki-67, she is likely going to do well, and the risk of recurrence probably will be low. -We discussed the role of Oncotype DX test, to predict the risk of cancer recurrence and benefit of adjuvant chemotherapy. If her surgical path shows tumor more than 5 mm, and no negative, I'll consider Oncotype DX test. She agrees to proceed. Written material of the test was given to her for review today.  -Giving the strong ER/PR positivity, I recommend adjuvant endocrine therapy with aromatase inhibitor, to decrease the risk of cancer recurrence. -She will probably also benefit from adjuvant breast radiation after lumpectomy. She was seen by Dr. Pablo Ledger today  Plan -She is likely going to have a right breast lumpectomy soon -If her surgical path shows tumor more than 5 mm, node-negative, I'll send out Oncotype DX -I'll see her back 3 weeks after her surgery to review the Oncotype DX test results, sooner if her surgical path reveals more advanced disease.   All questions were answered. The patient knows to call the clinic with any problems, questions or concerns. I spent 55  minutes counseling the patient face to face. The total time spent in the appointment was 60 minutes and more than 50% was on counseling.     Truitt Merle, MD 04/13/2015 8:36 PM

## 2015-04-13 ENCOUNTER — Encounter: Payer: Self-pay | Admitting: Nurse Practitioner

## 2015-04-13 ENCOUNTER — Encounter: Payer: Self-pay | Admitting: Hematology

## 2015-04-13 ENCOUNTER — Ambulatory Visit (HOSPITAL_BASED_OUTPATIENT_CLINIC_OR_DEPARTMENT_OTHER): Payer: Medicare Other | Admitting: Hematology

## 2015-04-13 ENCOUNTER — Ambulatory Visit
Admission: RE | Admit: 2015-04-13 | Discharge: 2015-04-13 | Disposition: A | Discharge status: Home / Self Care | Payer: Medicare Other | Source: Ambulatory Visit | Attending: Radiation Oncology | Admitting: Radiation Oncology

## 2015-04-13 ENCOUNTER — Other Ambulatory Visit (HOSPITAL_BASED_OUTPATIENT_CLINIC_OR_DEPARTMENT_OTHER): Payer: Medicare Other

## 2015-04-13 ENCOUNTER — Encounter: Payer: Self-pay | Admitting: *Deleted

## 2015-04-13 ENCOUNTER — Other Ambulatory Visit: Payer: Self-pay | Admitting: General Surgery

## 2015-04-13 ENCOUNTER — Ambulatory Visit: Payer: Medicare Other

## 2015-04-13 ENCOUNTER — Encounter: Payer: Self-pay | Admitting: Skilled Nursing Facility1

## 2015-04-13 ENCOUNTER — Ambulatory Visit: Payer: Medicare Other | Attending: General Surgery | Admitting: Physical Therapy

## 2015-04-13 ENCOUNTER — Encounter: Payer: Self-pay | Admitting: Physical Therapy

## 2015-04-13 VITALS — BP 139/75 | HR 70 | Temp 98.4°F | Resp 18 | Ht 66.0 in | Wt 178.7 lb

## 2015-04-13 DIAGNOSIS — M542 Cervicalgia: Secondary | ICD-10-CM | POA: Insufficient documentation

## 2015-04-13 DIAGNOSIS — Z170 Estrogen receptor positive status [ER+]: Secondary | ICD-10-CM | POA: Diagnosis not present

## 2015-04-13 DIAGNOSIS — C50411 Malignant neoplasm of upper-outer quadrant of right female breast: Secondary | ICD-10-CM

## 2015-04-13 DIAGNOSIS — C50911 Malignant neoplasm of unspecified site of right female breast: Secondary | ICD-10-CM

## 2015-04-13 DIAGNOSIS — R293 Abnormal posture: Secondary | ICD-10-CM | POA: Diagnosis present

## 2015-04-13 LAB — CBC WITH DIFFERENTIAL/PLATELET
BASO%: 0.3 % (ref 0.0–2.0)
BASOS ABS: 0 10*3/uL (ref 0.0–0.1)
EOS%: 2.9 % (ref 0.0–7.0)
Eosinophils Absolute: 0.3 10*3/uL (ref 0.0–0.5)
HEMATOCRIT: 38.7 % (ref 34.8–46.6)
HGB: 13 g/dL (ref 11.6–15.9)
LYMPH#: 2.8 10*3/uL (ref 0.9–3.3)
LYMPH%: 29 % (ref 14.0–49.7)
MCH: 30.4 pg (ref 25.1–34.0)
MCHC: 33.5 g/dL (ref 31.5–36.0)
MCV: 90.6 fL (ref 79.5–101.0)
MONO#: 0.8 10*3/uL (ref 0.1–0.9)
MONO%: 8.3 % (ref 0.0–14.0)
NEUT#: 5.8 10*3/uL (ref 1.5–6.5)
NEUT%: 59.5 % (ref 38.4–76.8)
Platelets: 351 10*3/uL (ref 145–400)
RBC: 4.27 10*6/uL (ref 3.70–5.45)
RDW: 14.1 % (ref 11.2–14.5)
WBC: 9.8 10*3/uL (ref 3.9–10.3)

## 2015-04-13 LAB — COMPREHENSIVE METABOLIC PANEL (CC13)
ALT: 19 U/L (ref 0–55)
ANION GAP: 6 meq/L (ref 3–11)
AST: 15 U/L (ref 5–34)
Albumin: 3.7 g/dL (ref 3.5–5.0)
Alkaline Phosphatase: 77 U/L (ref 40–150)
BUN: 18.9 mg/dL (ref 7.0–26.0)
CALCIUM: 9.8 mg/dL (ref 8.4–10.4)
CO2: 24 mEq/L (ref 22–29)
Chloride: 110 mEq/L — ABNORMAL HIGH (ref 98–109)
Creatinine: 0.8 mg/dL (ref 0.6–1.1)
EGFR: 77 mL/min/{1.73_m2} — ABNORMAL LOW (ref >90)
Glucose: 94 mg/dl (ref 70–140)
POTASSIUM: 4.3 meq/L (ref 3.5–5.1)
Sodium: 141 mEq/L (ref 136–145)
Total Bilirubin: 0.27 mg/dL (ref 0.20–1.20)
Total Protein: 7.2 g/dL (ref 6.4–8.3)

## 2015-04-13 NOTE — Therapy (Signed)
[place], [place], [address] Phone: [phone]   Fax:  [phone]  Physical Therapy Evaluation  Patient Details  Name: Jessica Roberts MRN: [id number] Date of Birth: 12-Jul-1946 Referring Provider:  Cari Caraway, MD  Encounter Date: 04/13/2015      PT End of Session - 04/13/15 1058    Visit Number 1   Number of Visits 1   PT Start Time 1030   PT Stop Time 2707  Also saw pt from 1110-1125   PT Time Calculation (min) 15 min   Activity Tolerance Patient tolerated treatment well   Behavior During Therapy [facility] for tasks assessed/performed      Past Medical History  Diagnosis Date  . Anxiety   . Hypertension   . Anxiety and depression   . Breast cancer of upper-outer quadrant of right female breast 04/11/2015  . Dengue fever   . Malaria   . Typhoid   . Depression     Past Surgical History  Procedure Laterality Date  . Surgical procedure for endometriosis    . Tonsillectomy      There were no vitals filed for this visit.  Visit Diagnosis:  Carcinoma of upper-outer quadrant of right female breast - Plan: PT plan of care cert/re-cert  Abnormal posture - Plan: PT plan of care cert/re-cert  Neck pain on right side - Plan: PT plan of care cert/re-cert      Subjective Assessment - 04/13/15 1049    Subjective Patient was seen today for a baseline assessment of her newly diagnosed breast cancer.   Pertinent History Patient was diagnosed with right upper outer grade I invasive ductal carcinoma breast cancer measuring 5 mm.  It is ER/PR positive and HER2 negative.   Patient Stated Goals reduce lymphedema risk and learn post op shoulder ROM HEP   Currently in Pain? Yes   Pain Score 3    Pain Location --  right upper trap   Pain Orientation Right   Pain Descriptors / Indicators Aching;Tightness   Pain Radiating Towards right arm   Pain Onset 1 to 4 weeks ago   Pain Frequency Intermittent    Aggravating Factors  falling alseep in a chair   Pain Relieving Factors heat   Multiple Pain Sites --  Also has intermittent left knee pain which is sharp with standing and is 5/10            [facility] PT Assessment - 04/13/15 0001    Assessment   Medical Diagnosis Right breast cancer   Onset Date/Surgical Date 04/08/15   Hand Dominance Right   Prior Therapy none   Precautions   Precautions Other (comment)  Active breast cancer   Restrictions   Weight Bearing Restrictions No   Balance Screen   Has the patient fallen in the past 6 months No   Has the patient had a decrease in activity level because of a fear of falling?  No   Is the patient reluctant to leave their home because of a fear of falling?  No   Home Environment   Living Environment Private residence   Living Arrangements Non-relatives/Friends   Available Help at Discharge Friend(s)   Prior Function   Level of Independence Independent   Vocation Full time employment   Museum/gallery curator at [employer] She does not exercise   Cognition   Overall Cognitive Status Within Functional Limits for tasks assessed   Posture/Postural Control   Posture/Postural Control  Postural limitations   Postural Limitations Rounded Shoulders;Forward head   ROM / Strength   AROM / PROM / Strength AROM;Strength   AROM   AROM Assessment Site Shoulder   Right/Left Shoulder Left;Right   Right Shoulder Extension 47 Degrees   Right Shoulder Flexion 142 Degrees   Right Shoulder ABduction 152 Degrees   Right Shoulder Internal Rotation 83 Degrees   Right Shoulder External Rotation 80 Degrees   Left Shoulder Extension 52 Degrees   Left Shoulder Flexion 144 Degrees   Left Shoulder ABduction 150 Degrees   Left Shoulder Internal Rotation 79 Degrees   Left Shoulder External Rotation 82 Degrees   Strength   Overall Strength Within functional limits for tasks performed           LYMPHEDEMA/ONCOLOGY QUESTIONNAIRE - 04/13/15  1056    Type   Cancer Type Right breast cancer   Lymphedema Assessments   Lymphedema Assessments Upper extremities   Right Upper Extremity Lymphedema   10 cm Proximal to Olecranon Process 25.8 cm   Olecranon Process 23.5 cm   10 cm Proximal to Ulnar Styloid Process 19.5 cm   Just Proximal to Ulnar Styloid Process 14.7 cm   Across Hand at [employer] 17.9 cm   At [place] of 2nd Digit 5.8 cm   Left Upper Extremity Lymphedema   10 cm Proximal to Olecranon Process 26 cm   Olecranon Process 24 cm   10 cm Proximal to Ulnar Styloid Process 19.1 cm   Just Proximal to Ulnar Styloid Process 14.9 cm   Across Hand at [employer] 18.2 cm   At [place] of 2nd Digit 5.7 cm       Patient was instructed today in a home exercise program today for post op shoulder range of motion. These included active assist shoulder flexion in sitting, scapular retraction, wall walking with shoulder abduction, and hands behind head external rotation.  She was encouraged to do these twice a day, holding 3 seconds and repeating 5 times when permitted by her physician.        PT Education - 04/13/15 1057    Education provided Yes   Education Details Post op shoulder ROM HEP and lymphedema risk reduction   Person(s) Educated Patient;Other (comment)  friend   Methods Explanation;Demonstration;Handout   Comprehension Verbalized understanding;Returned demonstration              Breast Clinic Goals - 04/13/15 1106    Patient will be able to verbalize understanding of pertinent lymphedema risk reduction practices relevant to her diagnosis specifically related to skin care.   Time 1   Period Days   Status Achieved   Patient will be able to return demonstrate and/or verbalize understanding of the post-op home exercise program related to regaining shoulder range of motion.   Time 1   Period Days   Status Achieved   Patient will be able to verbalize understanding of the importance of attending the  postoperative After Breast Cancer Class for further lymphedema risk reduction education and therapeutic exercise.   Time 1   Period Days   Status Achieved              Plan - 04/13/15 1058    Clinical Impression Statement Patient was diagnosed with right upper outer grade I invasive ductal carcinoma breast cancer measuring 5 mm.  It is ER/PR positive and HER2 negative.  She is planning to have a right lumpectomy with a sentinel node biopsy and Oncotype testing followed  by radiation and anti-estrogen therapy.  She may benefit from post op PT due to already having right upper trap/neck pain and concerns about lymphedema.   Pt will benefit from skilled therapeutic intervention in order to improve on the following deficits Decreased range of motion;Increased fascial restricitons;Impaired UE functional use;Pain;Decreased knowledge of precautions;Decreased strength   Rehab Potential Excellent   Clinical Impairments Affecting Rehab Potential none   PT Frequency One time visit   PT Treatment/Interventions Patient/family education;Therapeutic exercise   Consulted and Agree with Plan of Care Patient;Other (Comment)  friend     Patient will follow up at outpatient cancer rehab if needed following surgery.  If the patient requires physical therapy at that time, a specific plan will be dictated and sent to the referring physician for approval. The patient was educated today on appropriate basic range of motion exercises to begin post operatively and the importance of attending the After Breast Cancer class following surgery.  Patient was educated today on lymphedema risk reduction practices as it pertains to recommendations that will benefit the patient immediately following surgery.  She verbalized good understanding.  No additional physical therapy is indicated at this time.         G-Codes - 2015/04/23 1107    Functional Assessment Tool Used Clinical Judgement   Functional Limitation Self care    Self Care Current Status [phone]) At least 1 percent but less than 20 percent impaired, limited or restricted   Self Care Goal Status (K2446) At least 1 percent but less than 20 percent impaired, limited or restricted   Self Care Discharge Status [phone]) At least 1 percent but less than 20 percent impaired, limited or restricted       Problem List Patient Active Problem List   Diagnosis Date Noted  . Breast cancer of upper-outer quadrant of right female breast 04/11/2015    Annia Friendly, PT 04/23/2015 11:33 AM   [place], [place], [address] Phone: [phone]   Fax:  [phone]

## 2015-04-13 NOTE — Progress Notes (Signed)
Subjective:     Patient ID: Jessica Roberts, female   DOB: [birth year], 69 y.o.   MRN: [id number]  HPI   Review of Systems     Objective:   Physical Exam For the patient to understand and be given the tools to implement a healthy plant based diet during their cancer diagnosis.     Assessment:     Patient was seen today and found to be in good spirits and accompanied by her seemingly supportive friend. Pt is currently taking a multivitamin and multi mineral supplement. Pt states she is not currently physically active but will try to make it a regular part of her routine. Pt states she will look up some recipes for tophu and other soy products. Pt was excited to speak with the dietitian.       Plan:     Dietitian educated the patient on implementing a plant based diet by incorporating more plant proteins, fruits, and vegetables. As a part of a healthy routine physical activity was discussed. A folder of evidence based information with a focus on a plant based diet and general nutrition during cancer was given to the patient.  The importance of legitimate, evidence based information was discussed and examples were given. As a part of the continuum of care the cancer dietitian's contact information was given to the patient in the event they would like to have a follow up appointment.

## 2015-04-13 NOTE — Progress Notes (Signed)
Jessica Roberts is a very pleasant 69 y.o. female from [place], [place] with newly diagnosed grade 1 invasive ductal carcinoma of the right breast.  Biopsy results revealed the tumor's prognostic profile is ER positive, PR positive, and HER2/neu negative. Ki67 is 5%.  She presents today with her friend, Jessica Roberts, to the [facility]) for treatment consideration and recommendations from the breast surgeon, radiation oncologist, and medical oncologist.     I briefly met with Jessica Roberts and her friend during her [facility] visit today. We discussed the purpose of the Survivorship Clinic, which will include monitoring for recurrence, coordinating completion of age and gender-appropriate cancer screenings, promotion of overall wellness, as well as managing potential late/long-term side effects of anti-cancer treatments.    The treatment plan for Jessica Roberts will likely include surgery, radiation therapy, and anti-estrogen therapy.  As of today, the intent of treatment for Jessica Roberts is cure, therefore she will be eligible for the Survivorship Clinic upon her completion of treatment.  Her survivorship care plan (SCP) document will be drafted and updated throughout the course of her treatment trajectory. She will receive the SCP in an office visit with myself in the Survivorship Clinic once she has completed treatment.   Jessica Roberts was encouraged to ask questions and all questions were answered to her satisfaction.  She was given my business card and encouraged to contact me with any concerns regarding survivorship.  I look forward to participating in her care.   Kenn File, [place] [phone]

## 2015-04-13 NOTE — Patient Instructions (Signed)

## 2015-04-13 NOTE — Progress Notes (Signed)
Checked in new patient for br ca. Pt was given breast packet. Pt has my card for any financial questions or concerns.

## 2015-04-13 NOTE — Progress Notes (Signed)
Radiation Oncology         [phone]) [phone] ________________________________  Initial Outpatient Consultation - Date: 04/13/2015   Name: Jessica Roberts MRN: [id number]   DOB: [birth year]  REFERRING PHYSICIAN: Fanny Skates, MD  DIAGNOSIS AND STAGE: Breast cancer of upper-outer quadrant of right female breast   Staging form: Breast, AJCC 7th Edition     Clinical: Stage IA (T1a, N0, M0) - Unsigned   HISTORY OF PRESENT ILLNESS:Jessica Roberts is a 69 y.o. female presenting to clinic in regards to her T1bN0 Stage I cancer of the right female breast. She was found to have a mass on a screening mammogram. Ultrasound showed two areas 6cm apart. up was negative. One measuring 4mm, which was biopsy and found to be a Grade I invasive ductal carcinoma. ER and PR are positive. This is HER2 negative. The second area as at midline and was biopsy and was positive for ADH. The area of cancer is in the upper-outer quadrant. Per conference discussion, she would be a candidate for dual lumpectomies and sentinel lymph node biopsy. She was accompanied by a good friend. She has significant pain and bruising after her biopsy. She is interested in breast conservation. She is ready to stop her hormone replacement therapy. Her mother is a patient here who was diagnosed with breast cancer at 68 years of age.  PREVIOUS RADIATION THERAPY: No  Past medical, social and family history were reviewed in the electronic chart. Review of symptoms was reviewed in the electronic chart. Medications were reviewed in the electronic chart.   PHYSICAL EXAM: Pleasant female who appears her stated age. Breast tenderness on the right breast prevents palpation in examination. Significant bruise over the outer upper and lower quadrants of the right breast. No palpable abnormalities of the left breast and no palpable axillary supraclavicular or cervical adenopathy.  Vitals 04/13/15    BP 139/75 mmHg    Pulse Rate 70    Resp 18    Temp  98.4 F (36.9 C)    Temp Source Oral    SpO2 100 %    Weight 178 lb 11.2 oz (81.058 kg)    Height $Remov'5\' 6"'WgPOxM$  (1.676 m)          IMPRESSION: Jessica Roberts is a 69 year old female presenting to clinic in regards to her T1bN0 invasive ductal carcinoma of the right breast with a separate area of ADH.  PLAN: We spoke to Mrs. Eble today in regards to her desire for breast reservations. She is proceeding with two lumpectomies. She understands that if her tumor size is over 75mm, she will require oncotype testing to determine her need for chemotherapy. Also discussed the possibility of MRI given that her invasive cancer was not detected on her initial screening mammogram. She will discuss this with Dr. Dalbert Batman. I will leave it to his discression. I spoke to the patient today regarding her diagnosis and options for treatment. We discussed the equivalence in terms of survival and local failure between mastectomy and breast conservation. We discussed the role of radiation in decreasing local failures in patients who undergo lumpectomy. We discussed the process of simulation and the placement tattoos. We discussed 4-6 weeks of treatment as an outpatient. We discussed the possibility of asymptomatic lung damage. We discussed the low likelihood of secondary malignancies. We discussed the possible side effects including but not limited to skin redness, fatigue, permanent skin darkening, and breast swelling. We discussed the process of simulation and the placement of tattoos. I will see  her back after her Oncotype score. I did clarify with her that if she needed chemotherapy this would be performed prior to radiation. She met with medical oncology as well as a member of our patient family support team and our physical therapist. I will plan on seeing her back after her surgery.  She understands that she needs to stop her hormone replacement therapy.   I spent 40 minutes face to face with the patient and more than 50%  of that time was spent in counseling and/or coordination of care.  This document serves as a record of services personally performed by Thea Silversmith , MD. It was created on her behalf by Lenn Cal, a trained medical scribe. The creation of this record is based on the scribe's personal observations and the provider's statements to them. This document has been checked and approved by the attending provider.  _________________________________  Thea Silversmith, MD

## 2015-04-13 NOTE — Progress Notes (Signed)
Clinical Social Work [place] Psychosocial Distress Screening [place]  Patient completed distress screening protocol and scored a 2 on the Psychosocial Distress Thermometer which indicates mild distress. Clinical Social Worker met with patient and patients friend in [facility] to assess for distress and other psychosocial needs. Patient stated she was doing "ok" and felt comfortable with her treatment plan and treatment team. CSW offered support and validated patients feelings. CSW and patient discussed common feeling and emotions when being diagnosed with cancer, and the importance of support during treatment. CSW informed patient of the support team and support services at [facility]. CSW provided contact information and encouraged patient to call with any questions or concerns.  ONCBCN DISTRESS SCREENING 04/13/2015  Screening Type Initial Screening  Distress experienced in past week (1-10) 2  Practical problem type Work/school  Family Problem type Other (comment)  Emotional problem type Depression;Nervousness/Anxiety  Information Concerns Type Lack of info about maintaining fitness  Physical Problem type Pain;Sleep/insomnia  Physician notified of physical symptoms Yes  Referral to clinical psychology No  Referral to clinical social work No  Referral to dietition No  Referral to financial advocate No  Referral to support programs No  Referral to palliative care No   Johnnye Lana, MSW, LCSW, OSW-C Clinical Social Worker [place] [phone]

## 2015-04-14 ENCOUNTER — Other Ambulatory Visit: Payer: Self-pay | Admitting: General Surgery

## 2015-04-14 DIAGNOSIS — C50911 Malignant neoplasm of unspecified site of right female breast: Secondary | ICD-10-CM

## 2015-04-20 ENCOUNTER — Telehealth: Payer: Self-pay | Admitting: *Deleted

## 2015-04-20 ENCOUNTER — Telehealth: Payer: Self-pay | Admitting: Hematology

## 2015-04-20 NOTE — Telephone Encounter (Signed)
s.w.l pt and advised on OCT appt....pt ok and aware

## 2015-04-20 NOTE — Telephone Encounter (Signed)
Attempted to call patient to follow up from [facility] 04/13/15.  Unable to leave voicemail.  Will try again.

## 2015-04-21 ENCOUNTER — Ambulatory Visit
Admission: RE | Admit: 2015-04-21 | Discharge: 2015-04-21 | Disposition: A | Discharge status: Home / Self Care | Payer: Medicare Other | Source: Ambulatory Visit | Attending: General Surgery | Admitting: General Surgery

## 2015-04-21 DIAGNOSIS — C50911 Malignant neoplasm of unspecified site of right female breast: Secondary | ICD-10-CM

## 2015-04-22 ENCOUNTER — Ambulatory Visit (HOSPITAL_COMMUNITY)
Admission: RE | Admit: 2015-04-22 | Discharge: 2015-04-22 | Disposition: A | Discharge status: Home / Self Care | Payer: Medicare Other | Source: Ambulatory Visit | Attending: Anesthesiology | Admitting: Anesthesiology

## 2015-04-22 ENCOUNTER — Encounter (HOSPITAL_COMMUNITY): Payer: Self-pay

## 2015-04-22 ENCOUNTER — Encounter (HOSPITAL_COMMUNITY)
Admission: RE | Admit: 2015-04-22 | Discharge: 2015-04-22 | Disposition: A | Discharge status: Home / Self Care | Payer: Medicare Other | Source: Ambulatory Visit | Attending: General Surgery | Admitting: General Surgery

## 2015-04-22 DIAGNOSIS — Z87891 Personal history of nicotine dependence: Secondary | ICD-10-CM | POA: Diagnosis not present

## 2015-04-22 DIAGNOSIS — Z01818 Encounter for other preprocedural examination: Secondary | ICD-10-CM | POA: Insufficient documentation

## 2015-04-22 DIAGNOSIS — Z79899 Other long term (current) drug therapy: Secondary | ICD-10-CM | POA: Insufficient documentation

## 2015-04-22 DIAGNOSIS — C50911 Malignant neoplasm of unspecified site of right female breast: Secondary | ICD-10-CM | POA: Diagnosis not present

## 2015-04-22 DIAGNOSIS — I10 Essential (primary) hypertension: Secondary | ICD-10-CM | POA: Insufficient documentation

## 2015-04-22 DIAGNOSIS — F419 Anxiety disorder, unspecified: Secondary | ICD-10-CM | POA: Insufficient documentation

## 2015-04-22 DIAGNOSIS — K449 Diaphragmatic hernia without obstruction or gangrene: Secondary | ICD-10-CM | POA: Diagnosis not present

## 2015-04-22 DIAGNOSIS — Z01812 Encounter for preprocedural laboratory examination: Secondary | ICD-10-CM | POA: Insufficient documentation

## 2015-04-22 DIAGNOSIS — F329 Major depressive disorder, single episode, unspecified: Secondary | ICD-10-CM | POA: Insufficient documentation

## 2015-04-22 DIAGNOSIS — Z01811 Encounter for preprocedural respiratory examination: Secondary | ICD-10-CM

## 2015-04-22 DIAGNOSIS — K219 Gastro-esophageal reflux disease without esophagitis: Secondary | ICD-10-CM | POA: Insufficient documentation

## 2015-04-22 HISTORY — DX: Gastro-esophageal reflux disease without esophagitis: K21.9

## 2015-04-22 HISTORY — DX: Other specified postprocedural states: Z98.890

## 2015-04-22 HISTORY — DX: Other complications of anesthesia, initial encounter: T88.59XA

## 2015-04-22 HISTORY — DX: Unspecified fall, initial encounter: W19.XXXA

## 2015-04-22 HISTORY — DX: Fracture of nasal bones, initial encounter for closed fracture: S02.2XXA

## 2015-04-22 HISTORY — DX: Headache, unspecified: R51.9

## 2015-04-22 HISTORY — DX: Nausea with vomiting, unspecified: R11.2

## 2015-04-22 HISTORY — DX: Headache: R51

## 2015-04-22 HISTORY — DX: Unspecified injury of head, initial encounter: S09.90XA

## 2015-04-22 HISTORY — DX: Unspecified intracranial injury with loss of consciousness of unspecified duration, initial encounter: S06.9X9A

## 2015-04-22 HISTORY — DX: Adverse effect of unspecified anesthetic, initial encounter: T41.45XA

## 2015-04-22 HISTORY — DX: Concussion with loss of consciousness of unspecified duration, initial encounter: S06.0X9A

## 2015-04-22 LAB — COMPREHENSIVE METABOLIC PANEL
ALK PHOS: 78 U/L (ref 38–126)
ALT: 19 U/L (ref 14–54)
AST: 20 U/L (ref 15–41)
Albumin: 3.9 g/dL (ref 3.5–5.0)
Anion gap: 9 (ref 5–15)
BUN: 16 mg/dL (ref 6–20)
CALCIUM: 9.1 mg/dL (ref 8.9–10.3)
CHLORIDE: 108 mmol/L (ref 101–111)
CO2: 23 mmol/L (ref 22–32)
CREATININE: 0.87 mg/dL (ref 0.44–1.00)
GFR calc Af Amer: >60 mL/min (ref >60)
GFR calc non Af Amer: >60 mL/min (ref >60)
Glucose, Bld: 106 mg/dL — ABNORMAL HIGH (ref 65–99)
Potassium: 3.9 mmol/L (ref 3.5–5.1)
SODIUM: 140 mmol/L (ref 135–145)
Total Bilirubin: 0.3 mg/dL (ref 0.3–1.2)
Total Protein: 7 g/dL (ref 6.5–8.1)

## 2015-04-22 LAB — CBC WITH DIFFERENTIAL/PLATELET
BASOS ABS: 0.1 10*3/uL (ref 0.0–0.1)
Basophils Relative: 1 % (ref 0–1)
EOS ABS: 0.2 10*3/uL (ref 0.0–0.7)
EOS PCT: 2 % (ref 0–5)
HCT: 39.5 % (ref 36.0–46.0)
HEMOGLOBIN: 12.8 g/dL (ref 12.0–15.0)
LYMPHS ABS: 3.1 10*3/uL (ref 0.7–4.0)
LYMPHS PCT: 30 % (ref 12–46)
MCH: 30.2 pg (ref 26.0–34.0)
MCHC: 32.4 g/dL (ref 30.0–36.0)
MCV: 93.2 fL (ref 78.0–100.0)
Monocytes Absolute: 0.7 10*3/uL (ref 0.1–1.0)
Monocytes Relative: 7 % (ref 3–12)
NEUTROS PCT: 60 % (ref 43–77)
Neutro Abs: 6.1 10*3/uL (ref 1.7–7.7)
PLATELETS: 363 10*3/uL (ref 150–400)
RBC: 4.24 MIL/uL (ref 3.87–5.11)
RDW: 13.9 % (ref 11.5–15.5)
WBC: 10.2 10*3/uL (ref 4.0–10.5)

## 2015-04-22 NOTE — Progress Notes (Signed)
Patient has had a stress test done in [place] in 2008.  She states the test was fine and she did not need to follow-up with a cardiologist.  No other cardiac testing has been done; however patient states she has chest pain when 'really really stressed out' but hasn't had this in over a year.  She does complain of shortness of breath with activity.  Will request last office visit note from PCP (Dr. Cari Caraway) and obtain EKG (hx of htn) and CXR (d/t SOB) today.  Levada Dy NP aware.

## 2015-04-22 NOTE — Pre-Procedure Instructions (Addendum)
    Jessica Roberts  04/22/2015      [id number] - [place], [place] - [address] [place] [address] Phone: [phone] Fax: [phone]    Your procedure is scheduled on Wednesday September 14th 2016 at 1200 pm.  Report to [place] at 1000 am.  Call this number if you have problems the morning of surgery:  [phone]   Call this number if you have problems in the days leading up to your surgery:  [phone]    Remember:  Do not eat food or drink liquids after midnight Tuesday Sept 13th 2016.  Take these medicines the morning of surgery with A SIP OF WATER: if needed: Sertraline (zoloft), Wellbutrin, Nexium   STOP: ALL Vitamins, Supplements, Effient and Herbal Medications, Fish Oils, Aspirins, NSAIDs (Nonsteroidal Anti-inflammatories such as Ibuprofen, Aleve, or Advil), and Goody's/BC Powders   7 days prior to surgery, until after surgery as directed by your physician. This includes your Vitamin B12 and Multivitamin, Excedrin, Aleve, Melatonin, andTart Cherry capsules.    Do not wear jewelry, make-up or nail polish.  Do not wear lotions, powders, or perfumes.  You may wear deodorant.  Do not shave 48 hours prior to surgery.    Do not bring valuables to the hospital.  [facility] is not responsible for any belongings or valuables.  Contacts, dentures or bridgework may not be worn into surgery.  Leave your suitcase in the car.  After surgery it may be brought to your room.  For patients admitted to the hospital, discharge time will be determined by your treatment team.  Patients discharged the day of surgery will not be allowed to drive home.   Special instructions:  Please follow these instructions carefully:  1. Shower with CHG Soap the night before surgery and the morning of Surgery. 2. If you choose to wash your hair, wash your hair first as usual with your normal shampoo. 3.  After you shampoo, rinse your hair and body thoroughly to remove the Shampoo. 4. Use CHG as you would any other liquid soap. You can apply chg directly to the skin and wash gently with scrungie or a clean washcloth. 5. Apply the CHG Soap to your body ONLY FROM THE NECK DOWN. Do not use on open wounds or open sores. Avoid contact with your eyes, ears, mouth and genitals (private parts). Wash genitals (private parts) with your normal soap. 6. Wash thoroughly, paying special attention to the area where your surgery will be performed. 7. Thoroughly rinse your body with warm water from the neck down. 8. DO NOT shower/wash with your normal soap after using and rinsing off the CHG Soap. 9. Pat yourself dry with a clean towel.  10. Wear clean pajamas.  11. Place clean sheets on your bed the night of your first shower and do not sleep with pets.  Day of Surgery  Do not apply any lotions/deodorants the morning of surgery. Please wear clean clothes to the hospital/surgery center.    Please read over the following fact sheets that you were given. Pain Booklet, Coughing and Deep Breathing and Surgical Site Infection Prevention

## 2015-04-25 NOTE — Progress Notes (Signed)
Anesthesia chart review: 69 year old female scheduled for right breast lumpectomy X 2, wire localization X 2, right axillary sentinel node biopsy on 04/27/15 by Dr. Dalbert Batman.  History includes remote former smoker, post-operative N/V, anxiety, depression, HTN, right breast cancer, GERD, multiple head trauma/concussions dating from ~ 1952-2014 (details unknown), Dengue fever, Malaria, Typoid fever. She reported she "had a very hard time waking up" after anesthesia. She reported a history of chest pain with a normal stress test in [place] in 2008. At PAT she denied any recurrent chest pain in > 1 year.  Was seen by neurologist Dr. Leta Baptist last year for progressive short-term memory comes, cognitive impairment, paraphasic errors, balance difficulty, vertigo since November 2014. There was improvement as of early 2016. Extensive testing for CNS autoimmune disease was unremarkable, including lumbar puncture, brain and spine imaging and VEP. He felt her symptoms likely represented a combination of peripheral vestibulopathy (BPV vs labrynthitis) and heightened anxiety state. PRN follow-up recommended.   HEM-ONC is Dr. Truitt Merle. RAD-ONC is Dr. Thea Silversmith. PCP is Dr. Theadore Nan, last office note pending.   Meds include Excedrin Migraine, Wellbutrin XL, Valium, Nexium, Zoloft.  04/22/15 EKG: NSR, minimal voltage criteria for LVH, may be normal variant.   04/22/15 CXR: IMPRESSION: There is no active cardiopulmonary disease. There is a small hiatal hernia.  Preoperative labs noted.   If no acute changes then I would anticipate that she could proceed as planned.  George Hugh [facility] Short Stay Center/Anesthesiology Phone [phone] 04/25/2015 12:39 PM

## 2015-04-26 NOTE — H&P (Signed)
Jessica Roberts  Location: [facility] Patient #: [id number] DOB: 1946/05/16 Undefined / Language: Cleophus Molt / Race: White Female       History of Present Illness  The patient is a 69 year old female who presents with breast cancer. This is a 69 year old white female, referred by Dr. Andres Shad at the breast center of [facility] for evaluation of 2 abnormal areas in the right breast, invasive cancer at the upper outer quadrant and atypical hyperplasia at the 12:00 position. She was evaluated in the [facility] recently  by Dr. Pablo Ledger, Dr. Burr Medico, and me. Her PCP is Dr. Theadore Nan.         She has no prior history of breast problems. She hasn't had a mammogram for 9 years. Recent mammograms show a 5 mm density in the right breast upper outer quadrant and a second small density at the 12:00 position. Image guided biopsy of the density at the 12:00 position shows atypical ductal hyperplasia and fibrocystic change. Image guided biopsy of the second area in the upper outer quadrant shows a low-grade invasive ductal carcinoma, hormone receptor positive, HER-2 negative. These 2 areas have both been marked and are 6.4 cm apart. We talked a long time about this. She is interested in breast conservation. I think both of these areas need to come out.        What we are going to plan to do is to do a double wire, double lumpectomy. Sentinel lymph node biopsy. On the day of surgery were going to ask the breast center of [place] to put 2 wires in the breast from a craniocaudal position and will need to make one or two  breast incisions and an axillary incision. I have discussed the wire placement technique with  Dr. Luberta Robertson preop.The patient understands the indications, details, techniques, and numerous risks of the surgery. She's aware of  the risk of bleeding, infection, arm swelling, arm numbness, cosmetic deformity. She's aware of the possibility of repeat surgery  for positive margins or positive nodes. She understands all of this. She is in full agreement with this plan.        She has been told to discontinue hormone replacement therapy. She knows that she will need radiation therapy. Dr. Burr Medico is considering Oncotype testing. There does not appear to be a classic indication for MRI.       Comorbidities include recent neurologic workup for transient balance and memory problems. MS appears to be ruled out. Depression on Zoloft. Hypertension. Laparoscopy for endometriosis. GERD. Occasional migraine headaches. Mother has breast cancer age 37, living, on antiestrogen therapy and followed by Dr. Lindi Adie. Patient lives at home with a friend. 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    Other Problems  Anxiety Disorder Back Pain Breast Cancer Depression Diverticulosis Gastroesophageal Reflux Disease General anesthesia - complications High blood pressure Lump In Breast Migraine Headache Pancreatitis  Past Surgical History Breast Biopsy Bilateral. Colon Polyp Removal - Colonoscopy Oral Surgery Tonsillectomy  Diagnostic Studies History  Colonoscopy within last year Mammogram within last year Pap Smear 1-5 years ago  Medication History  No Current Medications Medications Reconciled  Social History  Alcohol use Moderate alcohol use. Caffeine use Coffee, Tea. No drug use Tobacco use Former smoker.  Family History  Alcohol Abuse Family Members In General. Arthritis Mother. Breast Cancer Mother. Cerebrovascular Accident Family Members In General. Depression Father. Diabetes Mellitus Family Members In General. Heart Disease Family Members In General.  Hypertension Mother. Melanoma Mother. Migraine Headache Family Members In General. Prostate Cancer Family Members In General. Respiratory Condition Father. Thyroid problems Mother.  Pregnancy / Birth  History  Age at menarche 14 years. Contraceptive History Oral contraceptives. Gravida 0 Irregular periods  Review of Systems  General Present- Fatigue and Weight Gain. Not Present- Appetite Loss, Chills, Fever, Night Sweats and Weight Loss. Skin Not Present- Change in Wart/Mole, Dryness, Hives, Jaundice, New Lesions, Non-Healing Wounds, Rash and Ulcer. HEENT Present- Seasonal Allergies and Wears glasses/contact lenses. Not Present- Earache, Hearing Loss, Hoarseness, Nose Bleed, Oral Ulcers, Ringing in the Ears, Sinus Pain, Sore Throat, Visual Disturbances and Yellow Eyes. Respiratory Present- Snoring. Not Present- Bloody sputum, Chronic Cough, Difficulty Breathing and Wheezing. Breast Not Present- Breast Mass, Breast Pain, Nipple Discharge and Skin Changes. Cardiovascular Present- Shortness of Breath. Not Present- Chest Pain, Difficulty Breathing Lying Down, Leg Cramps, Palpitations, Rapid Heart Rate and Swelling of Extremities. Gastrointestinal Present- Bloating, Change in Bowel Habits, Excessive gas, Gets full quickly at meals and Indigestion. Not Present- Abdominal Pain, Bloody Stool, Chronic diarrhea, Constipation, Difficulty Swallowing, Hemorrhoids, Nausea, Rectal Pain and Vomiting. Female Genitourinary Not Present- Frequency, Nocturia, Painful Urination, Pelvic Pain and Urgency. Musculoskeletal Present- Joint Pain, Joint Stiffness and Muscle Pain. Not Present- Back Pain, Muscle Weakness and Swelling of Extremities. Neurological Not Present- Decreased Memory, Fainting, Headaches, Numbness, Seizures, Tingling, Tremor, Trouble walking and Weakness. Psychiatric Present- Anxiety, Depression and Fearful. Not Present- Bipolar, Change in Sleep Pattern and Frequent crying. Endocrine Not Present- Cold Intolerance, Excessive Hunger, Hair Changes, Heat Intolerance, Hot flashes and New Diabetes. Hematology Present- Easy Bruising. Not Present- Excessive bleeding, Gland problems, HIV and Persistent  Infections.   Physical Exam  General Mental Status-Alert. General Appearance-Consistent with stated age. Hydration-Well hydrated. Voice-Normal.  Head and Neck Head-normocephalic, atraumatic with no lesions or palpable masses. Trachea-midline. Thyroid Gland Characteristics - normal size and consistency.  Eye Eyeball - Bilateral-Extraocular movements intact. Sclera/Conjunctiva - Bilateral-No scleral icterus.  Chest and Lung Exam Chest and lung exam reveals -quiet, even and easy respiratory effort with no use of accessory muscles and on auscultation, normal breath sounds, no adventitious sounds and normal vocal resonance. Inspection Chest Wall - Normal. Back - normal.  Breast Note: Right breast is soft with a small bruise. Very tender but no hematoma. No palpable mass in either breast. No other skin change. No axillary adenopathy.   Cardiovascular Cardiovascular examination reveals -normal heart sounds, regular rate and rhythm with no murmurs and normal pedal pulses bilaterally.  Abdomen Inspection Inspection of the abdomen reveals - No Hernias. Skin - Scar - no surgical scars. Palpation/Percussion Palpation and Percussion of the abdomen reveal - Soft, Non Tender, No Rebound tenderness, No Rigidity (guarding) and No hepatosplenomegaly. Auscultation Auscultation of the abdomen reveals - Bowel sounds normal.  Neurologic Neurologic evaluation reveals -alert and oriented x 3 with no impairment of recent or remote memory. Mental Status-Normal.  Musculoskeletal Normal Exam - Left-Upper Extremity Strength Normal and Lower Extremity Strength Normal. Normal Exam - Right-Upper Extremity Strength Normal and Lower Extremity Strength Normal.  Lymphatic Head & Neck  General Head & Neck Lymphatics: Bilateral - Description - Normal. Axillary  General Axillary Region: Bilateral - Description - Normal. Tenderness - Non Tender. Femoral &  Inguinal  Generalized Femoral & Inguinal Lymphatics: Bilateral - Description - Normal. Tenderness - Non Tender.    Assessment & Plan  PRIMARY CANCER OF RIGHT FEMALE BREAST (174.9  C50.911) Impression: 10:00 position. ER and PR positive. HER-2 negative.  Current Plans  You are being scheduled for surgery - Our schedulers will call you. Your imaging studies and biopsies show a small invasive cancer in the right breast at the upper outer quadrant. There is a second area of atypical ductal hyperplasia at the 12:00 position. Both of these areas should be excised with a double lumpectomy, using 2 wires. You also will have a sentinel lymph node biopsy, as discussed. We have discussed the indications, techniques, and numerous risk of this surgery in detail Please read the written information that we gave you My office will call you tomorrow to begin the scheduling process.    You should hear from our office's scheduling department within 5 working days about the location, date, and time of surgery. We try to make accommodations for patient's preferences in scheduling surgery, but sometimes the OR schedule or the surgeon's schedule prevents [place] from making those accommodations.  If you have not heard from our office [phone] in 5 working days, call the office and ask for your surgeon's nurse.  If you have other questions about your diagnosis, plan, or surgery, call the office and ask for your surgeon's nurse.  Pt Education - [facility] Breast Cancer Information Given - [employer] "Breast Journey" Package Pt Education - Lumpectomy and Axillary Lymph Node Dissection: sentinel lymph node biopsy     ATYPICAL DUCTAL HYPERPLASIA OF RIGHT BREAST (610.8  N60.91) Impression: 12 oclock position  HYPERTENSION, BENIGN (401.1  I10)  CHRONIC GERD (530.81  K21.9)  FAMILY HISTORY OF BREAST CANCER (V16.3  Z80.3) Impression: Mother. Age 26. Living. Antiestrogen's only.  DEPRESSION, CONTROLLED (311   F32.9) Impression: zoloft prn.    Edsel Petrin. Dalbert Batman, M.D., [facility] General and Minimally invasive Surgery Breast and Colorectal Surgery Office:   [phone] Pager:   [phone]

## 2015-04-27 ENCOUNTER — Other Ambulatory Visit: Payer: Self-pay | Admitting: General Surgery

## 2015-04-27 ENCOUNTER — Ambulatory Visit
Admission: RE | Admit: 2015-04-27 | Discharge: 2015-04-27 | Disposition: A | Discharge status: Home / Self Care | Payer: Medicare Other | Source: Ambulatory Visit | Attending: General Surgery | Admitting: General Surgery

## 2015-04-27 ENCOUNTER — Ambulatory Visit (HOSPITAL_COMMUNITY): Payer: Medicare Other | Admitting: Certified Registered Nurse Anesthetist

## 2015-04-27 ENCOUNTER — Ambulatory Visit (HOSPITAL_COMMUNITY)
Admission: RE | Admit: 2015-04-27 | Discharge: 2015-04-27 | Disposition: A | Discharge status: Home / Self Care | Payer: Medicare Other | Source: Ambulatory Visit | Attending: General Surgery | Admitting: General Surgery

## 2015-04-27 ENCOUNTER — Encounter (HOSPITAL_COMMUNITY): Payer: Self-pay | Admitting: *Deleted

## 2015-04-27 ENCOUNTER — Encounter (HOSPITAL_COMMUNITY)
Admission: RE | Disposition: A | Discharge status: Home / Self Care | Payer: Self-pay | Source: Ambulatory Visit | Attending: General Surgery

## 2015-04-27 ENCOUNTER — Ambulatory Visit (HOSPITAL_COMMUNITY): Payer: Medicare Other | Admitting: Emergency Medicine

## 2015-04-27 DIAGNOSIS — C50911 Malignant neoplasm of unspecified site of right female breast: Secondary | ICD-10-CM

## 2015-04-27 DIAGNOSIS — Z87891 Personal history of nicotine dependence: Secondary | ICD-10-CM | POA: Insufficient documentation

## 2015-04-27 DIAGNOSIS — Z803 Family history of malignant neoplasm of breast: Secondary | ICD-10-CM | POA: Diagnosis not present

## 2015-04-27 DIAGNOSIS — Z170 Estrogen receptor positive status [ER+]: Secondary | ICD-10-CM | POA: Diagnosis not present

## 2015-04-27 DIAGNOSIS — C50411 Malignant neoplasm of upper-outer quadrant of right female breast: Secondary | ICD-10-CM | POA: Diagnosis not present

## 2015-04-27 DIAGNOSIS — I10 Essential (primary) hypertension: Secondary | ICD-10-CM | POA: Insufficient documentation

## 2015-04-27 DIAGNOSIS — K219 Gastro-esophageal reflux disease without esophagitis: Secondary | ICD-10-CM | POA: Diagnosis not present

## 2015-04-27 DIAGNOSIS — N6091 Unspecified benign mammary dysplasia of right breast: Secondary | ICD-10-CM | POA: Diagnosis not present

## 2015-04-27 DIAGNOSIS — F329 Major depressive disorder, single episode, unspecified: Secondary | ICD-10-CM | POA: Insufficient documentation

## 2015-04-27 HISTORY — PX: BREAST LUMPECTOMY WITH NEEDLE LOCALIZATION AND AXILLARY SENTINEL LYMPH NODE BX: SHX5760

## 2015-04-27 HISTORY — PX: BREAST LUMPECTOMY: SHX2

## 2015-04-27 SURGERY — BREAST LUMPECTOMY WITH NEEDLE LOCALIZATION AND AXILLARY SENTINEL LYMPH NODE BX
Anesthesia: Regional | Site: Breast | Laterality: Right

## 2015-04-27 MED ORDER — FENTANYL CITRATE (PF) 100 MCG/2ML IJ SOLN
100.0000 ug | Freq: Once | INTRAMUSCULAR | Status: AC
  Administered 2015-04-27: 100 ug via INTRAVENOUS

## 2015-04-27 MED ORDER — ACETAMINOPHEN 325 MG PO TABS
650.0000 mg | ORAL_TABLET | ORAL | Status: DC | PRN
  Filled 2015-04-27: qty 2

## 2015-04-27 MED ORDER — HYDROCODONE-ACETAMINOPHEN 5-325 MG PO TABS: 1.0000 | ORAL_TABLET | Freq: Four times a day (QID) | ORAL | Status: DC | PRN

## 2015-04-27 MED ORDER — DEXAMETHASONE SODIUM PHOSPHATE 4 MG/ML IJ SOLN
INTRAMUSCULAR | Status: DC | PRN
  Administered 2015-04-27: 4 mg via INTRAVENOUS

## 2015-04-27 MED ORDER — OXYCODONE HCL 5 MG PO TABS: 5.0000 mg | ORAL_TABLET | ORAL | Status: DC | PRN

## 2015-04-27 MED ORDER — KETOROLAC TROMETHAMINE 30 MG/ML IJ SOLN
INTRAMUSCULAR | Status: AC
  Filled 2015-04-27: qty 1

## 2015-04-27 MED ORDER — SODIUM CHLORIDE 0.9 % IV SOLN: INTRAVENOUS | Status: DC

## 2015-04-27 MED ORDER — BUPIVACAINE-EPINEPHRINE (PF) 0.5% -1:200000 IJ SOLN
INTRAMUSCULAR | Status: DC | PRN
  Administered 2015-04-27: 25 mL

## 2015-04-27 MED ORDER — FENTANYL CITRATE (PF) 250 MCG/5ML IJ SOLN
INTRAMUSCULAR | Status: AC
  Filled 2015-04-27: qty 5

## 2015-04-27 MED ORDER — KETOROLAC TROMETHAMINE 30 MG/ML IJ SOLN
30.0000 mg | Freq: Once | INTRAMUSCULAR | Status: AC
  Administered 2015-04-27: 30 mg via INTRAVENOUS

## 2015-04-27 MED ORDER — CHLORHEXIDINE GLUCONATE 4 % EX LIQD: 1.0000 "application " | Freq: Once | CUTANEOUS | Status: DC

## 2015-04-27 MED ORDER — SODIUM CHLORIDE 0.9 % IJ SOLN
INTRAMUSCULAR | Status: AC
  Filled 2015-04-27: qty 10

## 2015-04-27 MED ORDER — HYDROMORPHONE HCL 1 MG/ML IJ SOLN
0.2500 mg | INTRAMUSCULAR | Status: DC | PRN
  Administered 2015-04-27 (×2): 0.25 mg via INTRAVENOUS
  Administered 2015-04-27: 0.5 mg via INTRAVENOUS

## 2015-04-27 MED ORDER — MIDAZOLAM HCL 2 MG/2ML IJ SOLN
INTRAMUSCULAR | Status: AC
Start: 2015-04-27 — End: 2015-04-27
  Filled 2015-04-27: qty 2

## 2015-04-27 MED ORDER — METHYLENE BLUE 1 % INJ SOLN
INTRAMUSCULAR | Status: AC
  Filled 2015-04-27: qty 10

## 2015-04-27 MED ORDER — EPHEDRINE SULFATE 50 MG/ML IJ SOLN
INTRAMUSCULAR | Status: DC | PRN
  Administered 2015-04-27: 10 mg via INTRAVENOUS
  Administered 2015-04-27 (×3): 5 mg via INTRAVENOUS

## 2015-04-27 MED ORDER — BUPIVACAINE-EPINEPHRINE (PF) 0.5% -1:200000 IJ SOLN
INTRAMUSCULAR | Status: DC | PRN
  Administered 2015-04-27: 30 mL

## 2015-04-27 MED ORDER — PROMETHAZINE HCL 25 MG/ML IJ SOLN
6.2500 mg | INTRAMUSCULAR | Status: DC | PRN
Start: 2015-04-27 — End: 2015-04-27

## 2015-04-27 MED ORDER — 0.9 % SODIUM CHLORIDE (POUR BTL) OPTIME
TOPICAL | Status: DC | PRN
  Administered 2015-04-27: 1000 mL

## 2015-04-27 MED ORDER — FENTANYL CITRATE (PF) 100 MCG/2ML IJ SOLN
INTRAMUSCULAR | Status: DC | PRN
  Administered 2015-04-27: 25 ug via INTRAVENOUS
  Administered 2015-04-27 (×3): 50 ug via INTRAVENOUS
  Administered 2015-04-27: 25 ug via INTRAVENOUS
  Administered 2015-04-27: 50 ug via INTRAVENOUS

## 2015-04-27 MED ORDER — FENTANYL CITRATE (PF) 100 MCG/2ML IJ SOLN: 25.0000 ug | INTRAMUSCULAR | Status: DC | PRN

## 2015-04-27 MED ORDER — SODIUM CHLORIDE 0.9 % IJ SOLN: 3.0000 mL | Freq: Two times a day (BID) | INTRAMUSCULAR | Status: DC

## 2015-04-27 MED ORDER — SODIUM CHLORIDE 0.9 % IV SOLN: 250.0000 mL | INTRAVENOUS | Status: DC | PRN

## 2015-04-27 MED ORDER — MIDAZOLAM HCL 2 MG/2ML IJ SOLN
INTRAMUSCULAR | Status: AC
  Administered 2015-04-27: 2 mg
  Filled 2015-04-27: qty 2

## 2015-04-27 MED ORDER — CEFAZOLIN SODIUM-DEXTROSE 2-3 GM-% IV SOLR
2.0000 g | INTRAVENOUS | Status: AC
  Administered 2015-04-27: 2 g via INTRAVENOUS
  Filled 2015-04-27: qty 50

## 2015-04-27 MED ORDER — LACTATED RINGERS IV SOLN
INTRAVENOUS | Status: DC
  Administered 2015-04-27 (×2): via INTRAVENOUS

## 2015-04-27 MED ORDER — ACETAMINOPHEN 650 MG RE SUPP
650.0000 mg | RECTAL | Status: DC | PRN
  Filled 2015-04-27: qty 1

## 2015-04-27 MED ORDER — HYDROMORPHONE HCL 1 MG/ML IJ SOLN
INTRAMUSCULAR | Status: AC
  Administered 2015-04-27: 0.25 mg via INTRAVENOUS
  Filled 2015-04-27: qty 1

## 2015-04-27 MED ORDER — BUPIVACAINE-EPINEPHRINE (PF) 0.5% -1:200000 IJ SOLN
INTRAMUSCULAR | Status: AC
  Filled 2015-04-27: qty 30

## 2015-04-27 MED ORDER — PROPOFOL 10 MG/ML IV BOLUS
INTRAVENOUS | Status: AC
  Filled 2015-04-27: qty 20

## 2015-04-27 MED ORDER — ONDANSETRON HCL 4 MG/2ML IJ SOLN
INTRAMUSCULAR | Status: DC | PRN
  Administered 2015-04-27: 4 mg via INTRAVENOUS

## 2015-04-27 MED ORDER — TECHNETIUM TC 99M SULFUR COLLOID FILTERED
1.0000 | Freq: Once | INTRAVENOUS | Status: AC | PRN
  Administered 2015-04-27: 1 via INTRADERMAL

## 2015-04-27 MED ORDER — MIDAZOLAM HCL 5 MG/ML IJ SOLN: 2.0000 mg | Freq: Once | INTRAMUSCULAR | Status: DC

## 2015-04-27 MED ORDER — FENTANYL CITRATE (PF) 100 MCG/2ML IJ SOLN
INTRAMUSCULAR | Status: AC
  Filled 2015-04-27: qty 2

## 2015-04-27 MED ORDER — SODIUM CHLORIDE 0.9 % IJ SOLN
INTRAMUSCULAR | Status: DC | PRN
  Administered 2015-04-27: 14:00:00 via INTRAMUSCULAR

## 2015-04-27 MED ORDER — PROPOFOL 10 MG/ML IV BOLUS
INTRAVENOUS | Status: DC | PRN
  Administered 2015-04-27: 200 mg via INTRAVENOUS
  Administered 2015-04-27: 100 mg via INTRAVENOUS

## 2015-04-27 MED ORDER — SODIUM CHLORIDE 0.9 % IJ SOLN: 3.0000 mL | INTRAMUSCULAR | Status: DC | PRN

## 2015-04-27 SURGICAL SUPPLY — 49 items
ADH SKN CLS APL DERMABOND .7 (GAUZE/BANDAGES/DRESSINGS) ×1
APPLIER CLIP 9.375 MED OPEN (MISCELLANEOUS) ×2
APR CLP MED 9.3 20 MLT OPN (MISCELLANEOUS) ×1
BINDER BREAST LRG (GAUZE/BANDAGES/DRESSINGS) IMPLANT
BINDER BREAST XLRG (GAUZE/BANDAGES/DRESSINGS) ×1 IMPLANT
BLADE SURG ROTATE 9660 (MISCELLANEOUS) IMPLANT
CANISTER SUCTION 2500CC (MISCELLANEOUS) ×2 IMPLANT
CHLORAPREP W/TINT 26ML (MISCELLANEOUS) ×2 IMPLANT
CLIP APPLIE 9.375 MED OPEN (MISCELLANEOUS) ×1 IMPLANT
CONT SPEC 4OZ CLIKSEAL STRL BL (MISCELLANEOUS) ×3 IMPLANT
COVER PROBE W GEL 5X96 (DRAPES) ×2 IMPLANT
COVER SURGICAL LIGHT HANDLE (MISCELLANEOUS) ×2 IMPLANT
DERMABOND ADVANCED (GAUZE/BANDAGES/DRESSINGS) ×1
DERMABOND ADVANCED .7 DNX12 (GAUZE/BANDAGES/DRESSINGS) ×1 IMPLANT
DEVICE DUBIN SPECIMEN MAMMOGRA (MISCELLANEOUS) ×3 IMPLANT
DRAIN CHANNEL 19F RND (DRAIN) IMPLANT
DRAPE LAPAROSCOPIC ABDOMINAL (DRAPES) ×2 IMPLANT
DRAPE UTILITY XL STRL (DRAPES) ×4 IMPLANT
DRSG PAD ABDOMINAL 8X10 ST (GAUZE/BANDAGES/DRESSINGS) ×1 IMPLANT
ELECT CAUTERY BLADE 6.4 (BLADE) ×2 IMPLANT
ELECT REM PT RETURN 9FT ADLT (ELECTROSURGICAL) ×2
ELECTRODE REM PT RTRN 9FT ADLT (ELECTROSURGICAL) ×1 IMPLANT
EVACUATOR SILICONE 100CC (DRAIN) IMPLANT
GAUZE SPONGE 4X4 12PLY STRL (GAUZE/BANDAGES/DRESSINGS) IMPLANT
GLOVE BIO SURGEON STRL SZ 6.5 (GLOVE) ×3 IMPLANT
GLOVE EUDERMIC 7 POWDERFREE (GLOVE) ×2 IMPLANT
GOWN STRL REUS W/ TWL LRG LVL3 (GOWN DISPOSABLE) ×1 IMPLANT
GOWN STRL REUS W/ TWL XL LVL3 (GOWN DISPOSABLE) ×1 IMPLANT
GOWN STRL REUS W/TWL LRG LVL3 (GOWN DISPOSABLE) ×4
GOWN STRL REUS W/TWL XL LVL3 (GOWN DISPOSABLE) ×2
KIT BASIN OR (CUSTOM PROCEDURE TRAY) ×2 IMPLANT
KIT MARKER MARGIN INK (KITS) ×3 IMPLANT
KIT ROOM TURNOVER OR (KITS) ×2 IMPLANT
NDL 18GX1X1/2 (RX/OR ONLY) (NEEDLE) ×1 IMPLANT
NEEDLE 18GX1X1/2 (RX/OR ONLY) (NEEDLE) ×2 IMPLANT
NEEDLE HYPO 25GX1X1/2 BEV (NEEDLE) ×4 IMPLANT
NS IRRIG 1000ML POUR BTL (IV SOLUTION) ×2 IMPLANT
PACK GENERAL/GYN (CUSTOM PROCEDURE TRAY) ×2 IMPLANT
PAD ARMBOARD 7.5X6 YLW CONV (MISCELLANEOUS) ×2 IMPLANT
SPONGE GAUZE 4X4 12PLY STER LF (GAUZE/BANDAGES/DRESSINGS) ×2 IMPLANT
SPONGE LAP 4X18 X RAY DECT (DISPOSABLE) ×2 IMPLANT
SUT ETHILON 2 0 FS 18 (SUTURE) IMPLANT
SUT MNCRL AB 4-0 PS2 18 (SUTURE) ×2 IMPLANT
SUT SILK 2 0 SH (SUTURE) ×2 IMPLANT
SUT VIC AB 3-0 SH 18 (SUTURE) ×2 IMPLANT
SUT VICRYL AB 3 0 TIES (SUTURE) IMPLANT
SYR CONTROL 10ML LL (SYRINGE) ×4 IMPLANT
TOWEL OR 17X24 6PK STRL BLUE (TOWEL DISPOSABLE) ×2 IMPLANT
TOWEL OR 17X26 10 PK STRL BLUE (TOWEL DISPOSABLE) ×2 IMPLANT

## 2015-04-27 NOTE — Transfer of Care (Signed)
Immediate Anesthesia Transfer of Care Note  Patient: Jessica Roberts  Procedure(s) Performed: Procedure(s): RIGHT BREAST LUMPECTOMY WITH  TWO NEEDLE LOCALIZATION AND TWO RIGHT AXILLARY SENTINEL LYMPH NODE BX (Right)  Patient Location: PACU  Anesthesia Type:General  Level of Consciousness: awake, alert  and oriented  Airway & Oxygen Therapy: Patient Spontanous Breathing and Patient connected to nasal cannula oxygen  Post-op Assessment: Report given to RN and Post -op Vital signs reviewed and stable  Post vital signs: Reviewed and stable  Last Vitals:  Filed Vitals:   04/27/15 1400  BP:   Pulse:   Temp: 36.5 C  Resp:     Complications: No apparent anesthesia complications

## 2015-04-27 NOTE — Anesthesia Postprocedure Evaluation (Signed)
  Anesthesia Post-op Note  Patient: Jessica Roberts  Procedure(s) Performed: Procedure(s): RIGHT BREAST LUMPECTOMY WITH  TWO NEEDLE LOCALIZATION AND TWO RIGHT AXILLARY SENTINEL LYMPH NODE BX (Right)  Patient Location: PACU  Anesthesia Type: General, Regional   Level of Consciousness: awake, alert  and oriented  Airway and Oxygen Therapy: Patient Spontanous Breathing  Post-op Pain: mild  Post-op Assessment: Post-op Vital signs reviewed  Post-op Vital Signs: Reviewed  Last Vitals:  Filed Vitals:   04/27/15 1511  BP: 161/82  Pulse: 72  Temp:   Resp: 18    Complications: No apparent anesthesia complications

## 2015-04-27 NOTE — Anesthesia Procedure Notes (Addendum)
Anesthesia Regional Block:  Pectoralis block  Pre-Anesthetic Checklist: ,, timeout performed, Correct Patient, Correct Site, Correct Laterality, Correct Procedure, Correct Position, site marked, Risks and benefits discussed,  Surgical consent,  Pre-op evaluation,  At surgeon's request and post-op pain management  Laterality: Right  Prep: chloraprep       Needles:   Needle Type: Echogenic Needle     Needle Length: 9cm 9 cm Needle Gauge: 21 and 21 G    Additional Needles:  Procedures: ultrasound guided (picture in chart) Pectoralis block Narrative:  Start time: 04/27/2015 10:45 AM End time: 04/27/2015 10:55 AM Injection made incrementally with aspirations every 5 mL.  Performed by: Personally  Anesthesiologist: Suzette Battiest  Additional Notes: Risks and benefits discussed. Pt tolerated well with no immediate complications.   Procedure Name: LMA Insertion Date/Time: 04/27/2015 12:36 PM Performed by: Merdis Delay Pre-anesthesia Checklist: Patient identified, Timeout performed, Emergency Drugs available, Suction available and Patient being monitored Patient Re-evaluated:Patient Re-evaluated prior to inductionOxygen Delivery Method: Circle system utilized Preoxygenation: Pre-oxygenation with 100% oxygen Intubation Type: IV induction LMA: LMA flexible inserted LMA Size: 4.0 Number of attempts: 1 Placement Confirmation: positive ETCO2,  breath sounds checked- equal and bilateral and CO2 detector Tube secured with: Tape Dental Injury: Teeth and Oropharynx as per pre-operative assessment

## 2015-04-27 NOTE — Discharge Instructions (Signed)
[facility] °Office Phone Number [phone] ° °BREAST BIOPSY/ PARTIAL MASTECTOMY: POST OP INSTRUCTIONS ° °Always review your discharge instruction sheet given to you by the facility where your surgery was performed. ° °IF YOU HAVE DISABILITY OR FAMILY LEAVE FORMS, YOU MUST BRING THEM TO THE OFFICE FOR PROCESSING.  DO NOT GIVE THEM TO YOUR DOCTOR. ° °1. A prescription for pain medication may be given to you upon discharge.  Take your pain medication as prescribed, if needed.  If narcotic pain medicine is not needed, then you may take acetaminophen (Tylenol) or ibuprofen (Advil) as needed. °2. Take your usually prescribed medications unless otherwise directed °3. If you need a refill on your pain medication, please contact your pharmacy.  They will contact our office to request authorization.  Prescriptions will not be filled after 5pm or on week-ends. °4. You should eat very light the first 24 hours after surgery, such as soup, crackers, pudding, etc.  Resume your normal diet the day after surgery. °5. Most patients will experience some swelling and bruising in the breast.  Ice packs and a good support bra will help.  Swelling and bruising can take several days to resolve.  °6. It is common to experience some constipation if taking pain medication after surgery.  Increasing fluid intake and taking a stool softener will usually help or prevent this problem from occurring.  A mild laxative (Milk of Magnesia or Miralax) should be taken according to package directions if there are no bowel movements after 48 hours. °7. Unless discharge instructions indicate otherwise, you may remove your bandages 24-48 hours after surgery, and you may shower at that time.  You may have steri-strips (small skin tapes) in place directly over the incision.  These strips should be left on the skin for 7-10 days.  If your surgeon used skin glue on the incision, you may shower in 24 hours.  The glue will flake off over the  next 2-3 weeks.  Any sutures or staples will be removed at the office during your follow-up visit. °8. ACTIVITIES:  You may resume regular daily activities (gradually increasing) beginning the next day.  Wearing a good support bra or sports bra minimizes pain and swelling.  You may have sexual intercourse when it is comfortable. °a. You may drive when you no longer are taking prescription pain medication, you can comfortably wear a seatbelt, and you can safely maneuver your car and apply brakes. °b. RETURN TO WORK:  ______________________________________________________________________________________ °9. You should see your doctor in the office for a follow-up appointment approximately two weeks after your surgery.  Your doctor’s nurse will typically make your follow-up appointment when she calls you with your pathology report.  Expect your pathology report 2-3 business days after your surgery.  You may call to check if you do not hear from us after three days. °10. OTHER INSTRUCTIONS: _______________________________________________________________________________________________ _____________________________________________________________________________________________________________________________________ °_____________________________________________________________________________________________________________________________________ °_____________________________________________________________________________________________________________________________________ ° °WHEN TO CALL YOUR DOCTOR: °1. Fever over 101.0 °2. Nausea and/or vomiting. °3. Extreme swelling or bruising. °4. Continued bleeding from incision. °5. Increased pain, redness, or drainage from the incision. ° °The clinic staff is available to answer your questions during regular business hours.  Please don’t hesitate to call and ask to speak to one of the nurses for clinical concerns.  If you have a medical emergency, go to the nearest  emergency room or call 911.  A surgeon from [facility] is always on call at the hospital. ° °For further questions, please visit [email or web address]  °

## 2015-04-27 NOTE — Op Note (Signed)
Patient Name:           Jessica Roberts   Date of Surgery:        04/27/2015  Pre op Diagnosis:      Invasive cancer right breast, upper outer quadrant, posterior, hormone receptor positive, HER-2 negative, clinical stage TIb, N0                                       Atypical ductal hyperplasia right breast, 12:00 position  Post op Diagnosis:    Same  Procedure:                 Inject blue dye right breast                                      Right partial mastectomy with wire localization, upper outer quadrant(low grade invasive cancer)                                      Right axillary sentinel node biopsy                                       Right partial mastectomy with wire localization, 12:00 position (ADH)  Surgeon:                     Edsel Petrin. Dalbert Batman, M.D., FACS  Assistant:                      OR staff  Operative Indications:    This is a 69 year old white female, referred by Dr. Andres Shad at the breast center of [facility] for evaluation of 2 abnormal areas in the right breast, invasive cancer at the upper outer quadrant and atypical hyperplasia at the 12:00 position. She was evaluated in the [facility] recently by Dr. Pablo Ledger, Dr. Burr Medico, and me. Her PCP is Dr. Theadore Nan.   She has no prior history of breast problems. She hasn't had a mammogram for 9 years. Recent mammograms show a 5 mm density in the right breast upper outer quadrant and a second small density at the 12:00 position. Image guided biopsy of the density at the 12:00 position shows atypical ductal hyperplasia and fibrocystic change. Image guided biopsy of the second area in the upper outer quadrant shows a low-grade invasive ductal carcinoma, hormone receptor positive, HER-2 negative. These 2 areas have both been marked and are 6.4 cm apart. We talked a long time about this. She is interested in breast conservation. I think both of these areas need to come out.   What we are going to  plan to do is to do a double wire, double lumpectomy. Sentinel lymph node biopsy  The patient understands the indications, details, techniques, and numerous risks of the surgery. She's aware of the risk of bleeding, infection, arm swelling, arm numbness, cosmetic deformity. She's aware of the possibility of repeat surgery for positive margins or positive nodes. She understands all of this. She is in full agreement with this plan.    She has been told to discontinue hormone replacement therapy. She knows that she will need radiation therapy.  Dr. Burr Medico is considering Oncotype testing.   Comorbidities include recent neurologic workup for transient balance and memory problems. MS appears to be ruled out. Depression on Zoloft. Hypertension. Laparoscopy for endometriosis. GERD. Occasional migraine headaches. Mother has breast cancer age 23, living, on antiestrogen therapy and followed by Dr. Lindi Adie. Patient lives at home with a friend. 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   Operative Findings:       The small invasive cancer in the right breast was in the far posterior upper outer quadrant.  The posterior margin of the specimen was widely the pectoralis fascia.  The area of atypical ductal hyperplasia was at the 12:00 position.  Both specimen mammograms showed the appropriate marker clip and wire in the proper location.  I found 1 sentinel lymph node only.  Procedure in Detail:          Both localization wires were placed this morning by Dr. Owens Shark.  They were replaced from a craniocaudal location which helped me to  dissect down directly to the areas to be removed.  The patient underwent injection of radionuclide in the holding area.  The patient underwent a right pectoral block in the holding area.  The patient was taken to the operating room and underwent general anesthesia with LMA device.  Surgical timeout was performed.  Intravenous  antibiotics were given.  Following alcohol prep I injected 5 mL of blue dye into the right breast, subareolar area.  This is methylene blue mixed with saline.  The breast was massaged for a few minutes.      The right breast breast and axilla were then prepped and draped in a sterile fashion.  0.5% Marcaine with epinephrine was used as local infiltration anesthetic.  I first made a curvilinear incision in the high upper outer quadrant and performed the lumpectomy on the cancer.  Because the wire and the marker clip were deep I took the dissection all the way down to the pectoralis fascia and removed the pectoralis fascia with the posterior margin.  The specimen mammogram looked good with the wire marker clip in place.  The specimen was marked with silk sutures and a 6 color ink kit  and sent to the lab.  I then dissected up into the axilla and found a single sentinel lymph node.  After this was removed there was no radioactivity or blue dye.      I then made a separate curvilinear transverse incision at the 12:00 position through the second localizing wire to dissect out the area of ADH.  Dissection was carried down around the wire.  The specimen was marked with sutures and the 6 color ink kit.  The specimen mammogram looked good containing the marker clip and the wire.  The specimen was sent to the lab.  Both incisions were irrigated with saline.  Hemostasis was excellent and achieved electrocautery.  The breast tissues were reapproximated with numerous sutures of 3-0 Vicryl.  In the higher incision which was a lumpectomy cavity placed multiple medical clips to orient the radiation oncologist.  After closing all the brest tissue in both incisions both skin incisions were closed with a running 4-0 Monocryl subcuticular sutures and Dermabond.  Breast binder and ice pack were placed.  The patient.  She well was taken to PACU in stable condition.  EBL 40 mL.  Counts correct.  Complications none.         Edsel Petrin. Dalbert Batman, M.D., FACS  General and Minimally Invasive Surgery Breast and Colorectal Surgery  04/27/2015 1:52 PM

## 2015-04-27 NOTE — Anesthesia Preprocedure Evaluation (Addendum)
Anesthesia Evaluation  Patient identified by MRN, date of birth, ID band Patient awake    Reviewed: Allergy & Precautions, NPO status , Patient's Chart, lab work & pertinent test results  Airway Mallampati: II  TM Distance: >3 FB Neck ROM: Full    Dental  (+) Dental Advisory Given, Teeth Intact   Pulmonary former smoker,    breath sounds clear to auscultation       Cardiovascular hypertension, negative cardio ROS   Rhythm:Regular Rate:Normal     Neuro/Psych  Headaches, Anxiety Depression    GI/Hepatic Neg liver ROS, GERD  ,  Endo/Other  negative endocrine ROS  Renal/GU negative Renal ROS     Musculoskeletal   Abdominal   Peds  Hematology negative hematology ROS (+)   Anesthesia Other Findings   Reproductive/Obstetrics                           Anesthesia Physical Anesthesia Plan  ASA: II  Anesthesia Plan: General and Regional   Post-op Pain Management: GA combined w/ Regional for post-op pain   Induction: Intravenous  Airway Management Planned: LMA  Additional Equipment:   Intra-op Plan:   Post-operative Plan:   Informed Consent: I have reviewed the patients History and Physical, chart, labs and discussed the procedure including the risks, benefits and alternatives for the proposed anesthesia with the patient or authorized representative who has indicated his/her understanding and acceptance.   Dental advisory given  Plan Discussed with: CRNA  Anesthesia Plan Comments:         Anesthesia Quick Evaluation

## 2015-04-27 NOTE — Interval H&P Note (Signed)
History and Physical Interval Note:  04/27/2015 11:50 AM  Jessica Roberts  has presented today for surgery, with the diagnosis of Invasive cancer right breast, atypical ductal hyperplasia right breast  The various methods of treatment have been discussed with the patient and family. After consideration of risks, benefits and other options for treatment, the patient has consented to  right breast lumpectomy 2, wire localization 2, right axillary sentinel node biopsy as a surgical intervention .  The patient's history has been reviewed, patient examined, no change in status, stable for surgery.  I have reviewed the patient's chart and labs.  Questions were answered to the patient's satisfaction.     Adin Hector

## 2015-04-28 ENCOUNTER — Encounter (HOSPITAL_COMMUNITY): Payer: Self-pay | Admitting: General Surgery

## 2015-05-02 ENCOUNTER — Telehealth: Payer: Self-pay | Admitting: *Deleted

## 2015-05-02 NOTE — Telephone Encounter (Signed)
Received order for oncotype testing per Dr. Burr Medico. Requisition sent to pathology. Received by Alyse Low. PAC sent to [facility]

## 2015-05-02 NOTE — Progress Notes (Signed)
Quick Note:  Inform patient of Pathology report,. Tell her that the cancer was 9 mm in diameter and that the lymph nodes are negative. Tell her that the cancer was very close to the posterior margin, as expected, but the margin is negative and we were all the way to the muscle. I do not think any further surgery will be required. Tell her that the next step is for to see radiation oncology and medical oncology in the near future. Tell her I'll discuss this with her in detail at her next visit. Let me know that you have made contact with her. Thanks. ______

## 2015-05-20 ENCOUNTER — Ambulatory Visit (HOSPITAL_BASED_OUTPATIENT_CLINIC_OR_DEPARTMENT_OTHER): Payer: Medicare Other | Admitting: Hematology

## 2015-05-20 ENCOUNTER — Ambulatory Visit (HOSPITAL_BASED_OUTPATIENT_CLINIC_OR_DEPARTMENT_OTHER): Payer: Medicare Other

## 2015-05-20 ENCOUNTER — Encounter: Payer: Self-pay | Admitting: Hematology

## 2015-05-20 ENCOUNTER — Telehealth: Payer: Self-pay | Admitting: Hematology

## 2015-05-20 VITALS — BP 134/56 | HR 72 | Temp 98.0°F | Resp 18 | Ht 66.0 in | Wt 181.5 lb

## 2015-05-20 DIAGNOSIS — C50411 Malignant neoplasm of upper-outer quadrant of right female breast: Secondary | ICD-10-CM

## 2015-05-20 DIAGNOSIS — Z23 Encounter for immunization: Secondary | ICD-10-CM

## 2015-05-20 DIAGNOSIS — Z170 Estrogen receptor positive status [ER+]: Secondary | ICD-10-CM | POA: Diagnosis not present

## 2015-05-20 MED ORDER — INFLUENZA VAC SPLIT QUAD 0.5 ML IM SUSY
0.5000 mL | PREFILLED_SYRINGE | Freq: Once | INTRAMUSCULAR | Status: AC
  Administered 2015-05-20: 0.5 mL via INTRAMUSCULAR
  Filled 2015-05-20: qty 0.5

## 2015-05-20 NOTE — Telephone Encounter (Signed)
Patient to get flu shot today. No f/u at this time. Per YF she is waiting on additional testing. No other orders per 10/7 pof.

## 2015-05-20 NOTE — Progress Notes (Addendum)
[facility]  [phone] [phone]  Clinic Follow Up Note   Patient Care Team: Cari Caraway, MD as PCP - General (Family Medicine) Fanny Skates, MD as Consulting Physician (General Surgery) Truitt Merle, MD as Consulting Physician (Hematology) Thea Silversmith, MD as Consulting Physician (Radiation Oncology) Mauro Kaufmann, RN as Registered Nurse Rockwell Germany, RN as Registered Nurse Sylvan Cheese, NP as Nurse Practitioner (Nurse Practitioner) 05/20/2015  CHIEF COMPLAINTS/PURPOSE OF CONSULTATION:  Newly diagnosed right breast stage I ductal carcinoma  Oncology History   Breast cancer of upper-outer quadrant of right female breast [facility])   Staging form: Breast, AJCC 7th Edition     Clinical stage from 04/07/2015: Stage IA (T1a, N0, M0) - Signed by Truitt Merle, MD on 05/20/2015     Pathologic stage from 04/27/2015: Stage IA (T1b, N0, cM0) - Signed by Truitt Merle, MD on 05/20/2015       Breast cancer of upper-outer quadrant of right female breast ([place])   04/01/2015 Mammogram Diagnostic mammogram and US showed a 0.7x 0.4 x 0.4 cm mass in the right breast 11:00 position, and a second 54mm lesion at upper-midline, and a benign cluster of cysts measuring 1cm.    04/07/2015 Receptors her2 ER 100% positive, PR 80% positive, HER-2 negative, Ki-67 5%   04/07/2015 Initial Diagnosis Breast cancer of upper-outer quadrant of right female breast   04/07/2015 Initial Biopsy Right breast upper outer quadrant mass core needle biopsy showed invasive ductal carcinoma and DCIS, grade 1.   04/27/2015 Surgery Right breast double lumpectomy, and sentinel lymph node biopsy.   04/27/2015 Pathology Results Right breast lumpectomy showed invasive ductal carcinoma, 0.9 cm, DCIS, invasive carcinoma 0.1 cm from posterior margin, remaining margins greater than 0.5 cm, second lumpectomy showed fibrocystic change. One sentinel lymph node was negative     HISTORY OF PRESENTING ILLNESS:   Jessica Roberts 69 y.o. female is here because of newly diagnosed right breast cancer. She is accompanied by her friend/roommate to our multidisciplinary clinic today.   This was discovered by screening mammogram, she did not have palpable mass, or any other new symptoms. She has been doing mammogram every year. The abnormal screening mammogram on 03/21/15 prompted a diagnostic mammogram and ultrasound on 04/01/2015, which showed 77mm mass in the right breast 10:00, biopsy of the mass showed grade 1 invasive ductal carcinoma and DCIS. She also had a cystic lesion 5 mm at 11:00, and a biopsy showed fibrocystic change with focal ADH.  She developed pain and bruise after breast biopsy. But otherwise she feels well overall.  She has arthritis, mainly in fingers. She also has GERD, which is controlled by Nexium. 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 She is single, has no children. She has been taking estrogen and progesterone for the past 10 years, which she stopped a few days ago.  INTERIM HISTORY Jessica Roberts returns for follow-up. She underwent right breast double lumpectomy, and sentinel lymph node biopsy. She tolerated the surgery very well, and has recovered well. She has occasional shooting pain into the incision site, no right arm swelling or limited range of motion. She otherwise feels well, energy level and appetite has back to normal.  MEDICAL HISTORY:  Past Medical History  Diagnosis Date  . Anxiety   . Hypertension   . Anxiety and depression   . Breast cancer of upper-outer quadrant of right female breast ([place]) 04/11/2015  . Dengue fever   . Malaria   .  Typhoid   . Depression   . Complication of anesthesia     "had a very hard time waking up"  . PONV (postoperative nausea and vomiting)   . GERD (gastroesophageal reflux disease)   . Headache   . Head injury, closed, with concussion ([name]) 1986    in [place]  . Head injury 1995    concussion in  [place], med evac to [place], ear damage with vertigo  . Head injury, acute, with loss of consciousness ([place]) 2000    out of the country  . Fall     broken nose  . Broken nose     SURGICAL HISTORY: Past Surgical History  Procedure Laterality Date  . Surgical procedure for endometriosis    . Tonsillectomy    . Colonoscopy w/ biopsies and polypectomy    . Nerve surgery      '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' during surgery for endometriosis  . Breast lumpectomy with needle localization and axillary sentinel lymph node bx Right 04/27/2015    Procedure: RIGHT BREAST LUMPECTOMY WITH  TWO NEEDLE LOCALIZATION AND TWO RIGHT AXILLARY SENTINEL LYMPH NODE BX;  Surgeon: Fanny Skates, MD;  Location: [facility];  Service: General;  Laterality: Right;   GYN HISTORY  Menarchal: 17 LMP: 89 Contraceptive:no  HRT: yes, has been on for about 10 years  G0P0:   SOCIAL HISTORY: Social History   Social History  . Marital Status: Single    Spouse Name: N/A  . Number of Children: 0  . Years of Education: BA   Occupational History  .      [employer]   Social History Main Topics  . Smoking status: Former Smoker -- 30 years    Types: Cigarettes    Quit date: 08/13/1993  . Smokeless tobacco: Never Used  . Alcohol Use: 2.4 oz/week    4 Glasses of wine per week     Comment: moderate   . Drug Use: No  . Sexual Activity: No   Other Topics Concern  . Not on file   Social History Narrative   Patient lives at home with her friend.   Caffeine Use: 1/2 cup daily    FAMILY HISTORY: Family History  Problem Relation Age of Onset  . Hypertension Mother   . Thyroid disease Mother   . Breast cancer Mother   . Emphysema Father   . Diabetes Maternal Grandmother   . Heart disease Paternal Grandmother   . Alcohol abuse Paternal Grandfather     ALLERGIES:  is allergic to codeine; percocet; and percodan.  MEDICATIONS:  Current Outpatient Prescriptions  Medication Sig Dispense Refill  . acetaminophen  (TYLENOL) 500 MG tablet Take 500 mg by mouth 2 (two) times daily as needed for moderate pain (pain).     Marland Kitchen aspirin-acetaminophen-caffeine (EXCEDRIN MIGRAINE) 250-250-65 MG per tablet Take 1 tablet by mouth 2 (two) times daily as needed (pain).     Marland Kitchen buPROPion (WELLBUTRIN XL) 300 MG 24 hr tablet Take 300 mg by mouth daily.   1  . Cyanocobalamin (VITAMIN B-12 SL) Place 1 tablet under the tongue daily.    . diazepam (VALIUM) 5 MG tablet Take 2.5 mg by mouth daily as needed for anxiety.     . Esomeprazole Magnesium (NEXIUM PO) Take 22.3 mg by mouth every other day.    Marland Kitchen HYDROcodone-acetaminophen (NORCO) 5-325 MG per tablet Take 1-2 tablets by mouth every 6 (six) hours as needed for moderate pain or severe pain. 30 tablet 0  . Misc  Natural Products (TART CHERRY ADVANCED) CAPS Take 3 capsules by mouth daily.    . Multiple Vitamin (MULTIVITAMIN WITH MINERALS) TABS tablet Take 1 tablet by mouth at bedtime.    . naproxen sodium (ALEVE) 220 MG tablet Take 220 mg by mouth 2 (two) times daily as needed (pain).    Marland Kitchen sertraline (ZOLOFT) 100 MG tablet Take 100 mg by mouth daily.      No current facility-administered medications for this visit.    REVIEW OF SYSTEMS:   Constitutional: Denies fevers, chills or abnormal night sweats Eyes: Denies blurriness of vision, double vision or watery eyes Ears, nose, mouth, throat, and face: Denies mucositis or sore throat Respiratory: Denies cough, dyspnea or wheezes Cardiovascular: Denies palpitation, chest discomfort or lower extremity swelling Gastrointestinal:  Denies nausea, heartburn or change in bowel habits Skin: Denies abnormal skin rashes Lymphatics: Denies new lymphadenopathy or easy bruising Neurological:Denies numbness, tingling or new weaknesses Behavioral/Psych: Mood is stable, no new changes  All other systems were reviewed with the patient and are negative.  PHYSICAL EXAMINATION: ECOG PERFORMANCE STATUS: 1 - Symptomatic but completely  ambulatory  Filed Vitals:   05/20/15 1324  BP: 134/56  Pulse: 72  Temp: 98 F (36.7 C)  Resp: 18   Filed Weights   05/20/15 1324  Weight: 181 lb 8 oz (82.328 kg)    GENERAL:alert, no distress and comfortable SKIN: skin color, texture, turgor are normal, no rashes or significant lesions EYES: normal, conjunctiva are pink and non-injected, sclera clear OROPHARYNX:no exudate, no erythema and lips, buccal mucosa, and tongue normal  NECK: supple, thyroid normal size, non-tender, without nodularity LYMPH:  no palpable lymphadenopathy in the cervical, axillary or inguinal LUNGS: clear to auscultation and percussion with normal breathing effort HEART: regular rate & rhythm and no murmurs and no lower extremity edema ABDOMEN:abdomen soft, non-tender and normal bowel sounds Musculoskeletal:no cyanosis of digits and no clubbing  PSYCH: alert & oriented x 3 with fluent speech NEURO: no focal motor/sensory deficits Breasts: Breast inspection showed them to be symmetrical with no nipple discharge. (+) Right breast incision site has healed well.  Palpation of the left breasts and axilla revealed no obvious mass that I could appreciate.   LABORATORY DATA:  I have reviewed the data as listed Lab Results  Component Value Date   WBC 10.2 04/22/2015   HGB 12.8 04/22/2015   HCT 39.5 04/22/2015   MCV 93.2 04/22/2015   PLT 363 04/22/2015    Recent Labs  04/13/15 0831 04/22/15 1530  NA 141 140  K 4.3 3.9  CL  --  108  CO2 24 23  GLUCOSE 94 106*  BUN 18.9 16  CREATININE 0.8 0.87  CALCIUM 9.8 9.1  GFRNONAA  --  >60  GFRAA  --  >60  PROT 7.2 7.0  ALBUMIN 3.7 3.9  AST 15 20  ALT 19 19  ALKPHOS 77 78  BILITOT 0.27 0.3    PATHOLOGY REPORT  Diagnosis 04/07/2015 1. Breast, right, needle core biopsy, upper midline - FIBROCYTIC CHANGES WITH FOCAL ATYPICAL DUCTAL HYPERPLASIA. - FIBROADENOMA. 2. Breast, right, needle core biopsy, upper outer - INVASIVE DUCTAL CARCINOMA. - DUCTAL  CARCINOMA IN SITU. Microscopic Comment 1. There is focal atypical ductal hyperplasia associated with fibrocystic changes and a fibroadenoma. 2. The findings are consistent with grade 1 invasive ductal carcinoma. Breast prognostic profile will be performed.  2. PROGNOSTIC INDICATORS Results: IMMUNOHISTOCHEMICAL AND MORPHOMETRIC ANALYSIS PERFORMED MANUALLY Estrogen Receptor: 100%, POSITIVE, STRONG STAINING INTENSITY Progesterone Receptor: 80%, POSITIVE, STONG  STAINING INTENSITY Proliferation Marker Ki67: 5% Results: HER2 - NEGATIVE RATIO OF HER2/CEP17 SIGNALS 1.25 AVERAGE HER2 COPY NUMBER PER CELL 2.00  Diagnosis 04/27/2015 1. Breast, lumpectomy, right upper outer quadrant - INVASIVE DUCTAL CARCINOMA, 0.9 CM. - DUCTAL CARCINOMA IN SITU. - INVASIVE CARCINOMA, 0.1 CM FROM POSTERIOR MARGIN. - REMAINING MARGINS GREATER THAN 0.5 CM. - FIBROCYSTIC CHANGES WITH CALCIFICATIONS. - BIOPSY SITE REACTION. 2. Breast, lumpectomy, right - FIBROCYSTIC CHANGES WITH FLORID USUAL DUCTAL HYPERPLASIA. - NO RESIDUAL ATYPICAL DUCTAL HYPERPLASIA. - BIOPSY SITE REACTION. 3. Lymph node, sentinel, biopsy, right axillary #1 - ONE BENIGN LYMPH NODE (0/1). 4. Fatty tissue, right additional axillary - BENIGN ADIPOSE TISSUE AND SKELETAL MUSCLE. - NO EVIDENCE OF MALIGNANCY. Microscopic Comment 1. BREAST, INVASIVE TUMOR, WITH LYMPH NODES PRESENT Specimen, including laterality and lymph node sampling (sentinel, non-sentinel): Right breast and one sentinel lymph node. Procedure: Localized lumpectomy and sentinel lymph node biopsy. Histologic type: Ductal. Grade: 1 Tubule formation: 1 Nuclear pleomorphism: 1 Mitotic: 1 Tumor size (gross measurement): 0.9 cm Margins: Invasive, distance to closest margin: 0.1 cm from posterior margin. In-situ, distance to closest margin: 0.5 cm from posterior margin. If margin positive, focally or broadly: N/A Lymphovascular invasion: No Ductal carcinoma in situ:  Present Grade: Low grade. Extensive intraductal component: No Lobular neoplasia: No Tumor focality: Unifocal. Treatment effect: No If present, treatment effect in breast tissue, lymph nodes or both: N/A Extent of tumor: Skin: N/A Nipple: N/A Skeletal muscle: Free of tumor. Lymph nodes: Examined: 1 Sentinel 0 Non-sentinel 1 Total Lymph nodes with metastasis: 0 Isolated tumor cells (< 0.2 mm): 0 Micrometastasis: (> 0.2 mm and < 2.0 mm): 0 Macrometastasis: (> 2.0 mm): 0 Extracapsular extension: N/A Breast prognostic profile: Case # [id number] Estrogen receptor: 100%, strong staining. Progesterone receptor: 80%, strong staining. Her 2 neu: Negative. Ratio 1.25. Will be repeated on current specimen. Ki-67: 5% Non-neoplastic breast: Fibrocystic changes. TNM: pT1b, pN0, pMX Results: HER2 - NEGATIVE RATIO OF HER2/CEP17 SIGNALS 1.33 AVERAGE HER2 COPY NUMBER PER CELL 2.00   RADIOGRAPHIC STUDIES: I have personally reviewed the radiological images as listed and agreed with the findings in the report.  MM diag and US Breast Ltd Uni Left Inc Axilla 04/01/2015  IMPRESSION: Irregular hypoechoic mass within the right breast at the 11 o'clock axis, 5 cm from the nipple, measuring 0.5 x 0.4 x 0.4 cm, corresponding to the mammographic finding. This is a suspicious finding for which ultrasound-guided biopsy is recommended.  RECOMMENDATION: Ultrasound-guided biopsy of the right breast mass located at the 11 o'clock axis, 5 cm from the nipple, measuring 0.5 x 0.4 x 0.4 cm.  Ultrasound-guided biopsy is scheduled for August 23rd at 3 p.m.  I have discussed the findings and recommendations with the patient. Results were also provided in writing at the conclusion of the visit. If applicable, a reminder letter will be sent to the patient regarding the next appointment.  BI-RADS CATEGORY  4: Suspicious.   Electronically Signed   By: Franki Cabot M.D.   On: 04/01/2015 16:31   Mm Screening Breast Tomo  Bilateral  03/23/2015   CLINICAL DATA:  Screening.  EXAM: DIGITAL SCREENING BILATERAL MAMMOGRAM WITH 3D TOMO WITH CAD  COMPARISON:  Previous exam(s).  ACR Breast Density Category c: The breast tissue is heterogeneously dense, which may obscure small masses.  FINDINGS: In the right breast possible mass requires further evaluation.  In the left breast possible mass requires further evaluation.  Images were processed with CAD.  IMPRESSION: Further evaluation is suggested for possible  mass in the right breast.  Further evaluation is suggested for possible mass in the left breast.  RECOMMENDATION: Diagnostic mammogram and possibly ultrasound of both breasts. (Code:FI-B-46M)  The patient will be contacted regarding the findings, and additional imaging will be scheduled.  BI-RADS CATEGORY  0: Incomplete. Need additional imaging evaluation and/or prior mammograms for comparison.   Electronically Signed   By: Lovey Newcomer M.D.   On: 03/23/2015 11:56    ASSESSMENT & PLAN:  69 year old Caucasian female, postmenopausal, was found to have a right breast cancer by screening mammogram.  1. Right breast invasive ductal carcinoma, pT1bN0M0, stage IA, grade 1, ER positive, PR positive, HER-2 negative, (+) DCIS and ADH -I reviewed her surgical path findings with her in great details. -She has stage I breast cancer, likely has been cured with complete surgical resection.  -We reviewed the risk of cancer recurrence after surgery. Given the ER/PR positivity, HER-2 negative, low Ki-67, she is likely going to do well, and the risk of recurrence probably will be low. -We have ordered Oncotype DX test on her surgical sample, unfortunately the results still pending. Adjuvant chemotherapy will be decided based on the Oncotype DX test result. I'll call her next week when I have received the results. -given the strong ER/PR positivity, I recommend adjuvant endocrine therapy with aromatase inhibitor, to decrease the risk of cancer  recurrence. This will start after she completes breast radiation. -She will have adjuvant breast radiation, she will follow-up with Dr. Pablo Ledger.   Plan -I'll call her next week about her Oncotype DX test result. If the recurrence score is high risk, I'll meet her again to go over the details about chemotherapy -If no adjuvant chemotherapy, I'll send her to radiation oncologist Dr. Pablo Ledger for radiation -I'll see her back when she completes breast radiation, to start her on aromatase inhibitor  All questions were answered. The patient knows to call the clinic with any problems, questions or concerns. I spent 25 minutes counseling the patient face to face. The total time spent in the appointment was 30 minutes and more than 50% was on counseling.     Truitt Merle, MD 05/20/2015 10:03 PM   Addendum  Her Oncotype DX RS returns as 15, low risk, which predicts 10-year distant recurrence risk of 10% with tamoxifen alone. I called her and explained the result to her, and do not recommend chemotherapy. She was quite pleased with the results and appreciated the call.  Truitt Merle  05/26/2015

## 2015-05-24 ENCOUNTER — Encounter: Payer: Self-pay | Admitting: Hematology

## 2015-05-24 NOTE — Progress Notes (Signed)
Received a voicemail from Mylo from the [place] testing facility stating that they are showing pt insurance termed on 05/13/15. I returned call today and verified that was the insurance information we had on file and that it verified electronically.I spoke with Renee and she stated they will reach out to the patient to see if there were any changes in insurance.

## 2015-05-27 ENCOUNTER — Encounter (HOSPITAL_COMMUNITY): Payer: Self-pay

## 2015-05-31 ENCOUNTER — Telehealth: Payer: Self-pay | Admitting: *Deleted

## 2015-05-31 NOTE — Telephone Encounter (Signed)
Christian with Oncotype DX Testing [phone]) would like a return call.  "Have you received economic hardship form faxed that needs provider signature."

## 2015-06-01 ENCOUNTER — Encounter: Payer: Self-pay | Admitting: Radiation Oncology

## 2015-06-01 NOTE — Progress Notes (Signed)
Location of Breast Cancer: Right Breast Upper-outer Quadrant    Receptor Status: ER(+), PR (+), Her2-neu (neg)  Did patient present with symptoms (if so, please note symptoms) or was this found on screening mammography?: routine screening  Past/Anticipated interventions by surgeon, if RXV:[id number] of Breast Cancer: Right Breast Upper-outer Quadrant  Histology per Pathology Report:Diagnosis 04/07/15:Initial DX: 1. Breast, right, needle core biopsy, upper midline - FIBROCYTIC CHANGES WITH FOCAL ATYPICAL DUCTAL HYPERPLASIA.- FIBROADENOMA. 2. Breast, right, needle core biopsy, upper outer- INVASIVE DUCTAL CARCINOMA.- DUCTAL CARCINOMA IN SITU.  Surgical interventions:  Diagnosis 04/27/15: Dr. Fanny Skates, MD 1. Breast, lumpectomy, right upper outer quadrant - INVASIVE DUCTAL CARCINOMA, 0.9 CM. - DUCTAL CARCINOMA IN SITU. - INVASIVE CARCINOMA, 0.1 CM FROM POSTERIOR MARGIN. - REMAINING MARGINS GREATER THAN 0.5 CM. - FIBROCYSTIC CHANGES WITH CALCIFICATIONS. - BIOPSY SITE REACTION. 2. Breast, lumpectomy, right - FIBROCYSTIC CHANGES WITH FLORID USUAL DUCTAL HYPERPLASIA. - NO RESIDUAL ATYPICAL DUCTAL HYPERPLASIA. - BIOPSY SITE REACTION. 1 of 4 FINAL for ALARIA, OCONNOR ([id number]) Diagnosis(continued) 3. Lymph node, sentinel, biopsy, right axillary #1 - ONE BENIGN LYMPH NODE (0/1). 4. Fatty tissue, right additional axillary - BENIGN ADIPOSE TISSUE AND SKELETAL MUSCLE.- NO EVIDENCE OF MALIGNANCY. F/U appt  05/13/15: f/u 6 weeks or as needed, swekking and bruising,  Past/Anticipated interventions by medical oncology, if any: Chemotherapy : Dr. Burr Medico, Oncotype DX=15, no chemotherapy   Lymphedema issues, if any: NO   Pain issues, if any: She has been experiences neck pain which she is seeing a chiropractor for. She also complains of a pain as if somebody is "jabbing" her in her right breast. She has spoken to her surgeon, and he believes this may be a nerve pain. She also has a pain on the  underneath side of her right arm, which she states feels like someone is rubbing her at time and her surgeon is aware of this also.   SAFETY ISSUES:  Prior radiation? NO  Pacemaker/ICD? NO  Possible current pregnancy?NO  Is the patient on methotrexate? NO  Current Complaints / other details:Seen in Breast Clinic 04/13/15, Single,G0P0, Menarche age 69,LMP age 24, HRT 10 years quit recently, former smoker cigarettes 30 years, , 4 glasses wine weekly, no drug use, Mother breast cancer,,thyroid, Father emphysema, Allergies: Codeine,percocet and percodan     The patient spoke with Dr. Ernestina Penna office on Tuesday and they informed her no chemotherapy is planned at this time. She has two incisions on her right breast with dermabond still present. They are healing well, and she denies any issues with these incisions.   BP 122/83 mmHg  Pulse 80  Temp(Src) 97.9 F (36.6 C)  Ht $R'5\' 6"'kP$  (1.676 m)  Wt 181 lb 9.6 oz (82.373 kg)  BMI 29.32 kg/m2 Jessica Eaton, RN 06/01/2015,1:21 PM

## 2015-06-02 ENCOUNTER — Ambulatory Visit
Admission: RE | Admit: 2015-06-02 | Discharge: 2015-06-02 | Disposition: A | Discharge status: Home / Self Care | Payer: Medicare Other | Source: Ambulatory Visit | Attending: Radiation Oncology | Admitting: Radiation Oncology

## 2015-06-02 ENCOUNTER — Encounter: Payer: Self-pay | Admitting: Radiation Oncology

## 2015-06-02 VITALS — BP 122/83 | HR 80 | Temp 97.9°F | Ht 66.0 in | Wt 181.6 lb

## 2015-06-02 DIAGNOSIS — C50411 Malignant neoplasm of upper-outer quadrant of right female breast: Secondary | ICD-10-CM | POA: Insufficient documentation

## 2015-06-02 DIAGNOSIS — Z170 Estrogen receptor positive status [ER+]: Secondary | ICD-10-CM | POA: Diagnosis not present

## 2015-06-02 DIAGNOSIS — Z510 Encounter for antineoplastic radiation therapy: Secondary | ICD-10-CM | POA: Insufficient documentation

## 2015-06-02 HISTORY — DX: Malignant neoplasm of unspecified site of unspecified female breast: C50.919

## 2015-06-02 HISTORY — DX: Allergy, unspecified, initial encounter: T78.40XA

## 2015-06-02 NOTE — Progress Notes (Signed)
Department of Radiation Oncology  Phone:  [phone] Fax:        [phone]   Name: Jessica Roberts MRN: [id number]  DOB: 1946-02-21  Date: 06/02/2015  Follow Up Visit Note  Diagnosis: Breast cancer of upper-outer quadrant of right female breast [facility])   Staging form: Breast, AJCC 7th Edition     Clinical stage from 04/07/2015: Stage IA (T1a, N0, M0) - Signed by Truitt Merle, MD on 05/20/2015     Pathologic stage from 04/27/2015: Stage IA (T1b, N0, cM0) - Signed by Truitt Merle, MD on 05/20/2015   Summary and Interval since last radiation: No previous  Interval History: Jessica Roberts presents today for routine followup in regards to her T1bN0 Invasive ductal carcinoma of the right breast. Lumpectomy on 04/27/2015. She had fibrocystic change at one site and the other site showed a 75mm area of DCIS. One sentinel lymph nodes with benign. She is doing well after her surgery. Her oncotype score was low, so chemotherapy is not recommended. She is therefore ready to begin radiation. The patient projected a healthy mental status and was accompanied by her sister in law for today's radiation oncology appointment. 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 This concern was addressed.   Physical Exam:  Filed Vitals:   06/02/15 1015  BP: 122/83  Pulse: 80  Temp: 97.9 F (36.6 C)  Height: 5\' 6"  (1.676 m)  Weight: 181 lb 9.6 oz (82.373 kg)  The patient is alert and oriented. There is no significant changes to the status of overall health to be noted at this time. Breast: Two incisions in the right breast, healing well. No evidence of infection. She can move her arm without strain.  IMPRESSION: Jessica Roberts is a 69 y.o. female in regards to her breast cancer of upper-outer quadrant of right female breast ([place]). She is recovering well from radiation treatments and  understands the importance of completing annual mammograms. She is managing reported symptoms appropriately. The patient understands healthy methods of managing in regard to traditional symptoms to expect during treatment. She was very pleased that she does not require chemotherapy and plans on continuing to work throughout her future radiation therapy treatments. The patient understands that she can access her appointments and medical records via [place]. She is requested a prescription for a bra.  PLAN: I spoke to the patient today regarding her diagnosis and options for treatment. We discussed the equivalence in terms of survival and local failure between mastectomy and breast conservation. We discussed the role of radiation in decreasing local failures in patients who undergo lumpectomy. We discussed the process of simulation and the placement tattoos. We discussed 4-6 weeks of treatment as an outpatient. We discussed the possibility of asymptomatic lung damage. We discussed the low likelihood of secondary malignancies. We discussed the possible side effects including but not limited to skin redness, fatigue, permanent skin darkening, and breast swelling. Healthy methods of management in regards to reported symptoms were reviewed in detail. Her CT simulation planning session is to be scheduled to take place next week, with radiation therapy treatments to proceed starting the following week. Social work will follow-up with the patient as she lost her insurance in September per the chart.. All vocalized questions and concerns have been addressed. If the patient develops any further questions or concerns in regards to her treatment and recovery, she has been  encouraged to contact Dr. Pablo Ledger, MD. She understands that she will require weekly follow-up appointments once treatments start. Consent documentation as reviewed and signed. A prescription for a bra will be provided.  This document serves as a record of  services personally performed by Thea Silversmith , MD. It was created on her behalf by Lenn Cal, a trained medical scribe. The creation of this record is based on the scribe's personal observations and the provider's statements to them. This document has been checked and approved by the attending provider.   ------------------------------------------------  Thea Silversmith, MD

## 2015-06-07 ENCOUNTER — Ambulatory Visit
Admission: RE | Admit: 2015-06-07 | Discharge: 2015-06-07 | Disposition: A | Discharge status: Home / Self Care | Payer: Medicare Other | Source: Ambulatory Visit | Attending: Radiation Oncology | Admitting: Radiation Oncology

## 2015-06-07 DIAGNOSIS — C50411 Malignant neoplasm of upper-outer quadrant of right female breast: Secondary | ICD-10-CM

## 2015-06-07 DIAGNOSIS — Z510 Encounter for antineoplastic radiation therapy: Secondary | ICD-10-CM | POA: Diagnosis not present

## 2015-06-07 NOTE — Progress Notes (Signed)
Name: Javen Ridings   MRN: [id number]  Date:  06/07/2015  DOB: 1945/08/27  Status:outpatient    DIAGNOSIS: Breast cancer.  CONSENT VERIFIED: yes   SET UP: Patient is setup supine   IMMOBILIZATION:  The following immobilization was used:Custom Moldable Pillow, breast board.   NARRATIVE: Ms. Scarboro was brought to the [facility].  Identity was confirmed.  All relevant records and images related to the planned course of therapy were reviewed.  Then, the patient was positioned in a stable reproducible clinical set-up for radiation therapy.  Wires were placed to delineate the clinical extent of breast tissue. A wire was placed on the scar as well.  CT images were obtained.  An isocenter was placed. Skin markings were placed.  The CT images were loaded into the planning software where the target and avoidance structures were contoured.  The radiation prescription was entered and confirmed. The patient was discharged in stable condition and tolerated simulation well.    TREATMENT PLANNING NOTE:  Treatment planning then occurred. I have requested : MLC's, isodose plan, basic dose calculation  I personally designed and supervised the construction of 3 medically necessary complex treatment devices for the protection of critical normal structures including the lungs and contralateral breast as well as the immobilization device which is necessary for set up certainty.   3D simulation occurred. I requested and analyzed a dose volume histogram of the heart, lungs and lumpectomy cavity.

## 2015-06-07 NOTE — Progress Notes (Signed)
Radiation Oncology         [phone] ________________________________  Name: Jessica Roberts      MRN: [id number]          Date: 06/07/2015              DOB: April 20, 1946  Optical Surface Tracking Plan:  Since intensity modulated radiotherapy (IMRT) and 3D conformal radiation treatment methods are predicated on accurate and precise positioning for treatment, intrafraction motion monitoring is medically necessary to ensure accurate and safe treatment delivery.  The ability to quantify intrafraction motion without excessive ionizing radiation dose can only be performed with optical surface tracking. Accordingly, surface imaging offers the opportunity to obtain 3D measurements of patient position throughout IMRT and 3D treatments without excessive radiation exposure.  I am ordering optical surface tracking for this patient's upcoming course of radiotherapy. ________________________________ Signature   Reference:   Ursula Alert, J, et al. Surface imaging-based analysis of intrafraction motion for breast radiotherapy patients.Journal of [place], n. 6, nov. 2014. ISSN [id number].   Available at: <http://www.jacmp.org/index.php/jacmp/article/view/4957>.

## 2015-06-10 ENCOUNTER — Encounter: Payer: Self-pay | Admitting: *Deleted

## 2015-06-10 NOTE — Progress Notes (Signed)
[place] Psychosocial Distress Screening Clinical Social Work  Clinical Social Work was referred by distress screening protocol.  The patient scored a 7 on the Psychosocial Distress Thermometer which indicates severe distress. Clinical Social Worker phoned pt to assess for distress and other psychosocial needs. CSW phoned pt to check in. 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 She reports she is doing "ok", but her mother has Stage IV cancer and "is on the way out". Pt requested CSW to call back next week and CSW will return call as requested.   ONCBCN DISTRESS SCREENING 06/02/2015  Screening Type Initial Screening  Distress experienced in past week (1-10) 7  Practical problem type Work/school  Family Problem type Other (comment)  Emotional problem type Depression;Nervousness/Anxiety  Spiritual/Religous concerns type Loss of sense of purpose  Information Concerns Type   Physical Problem type Pain;Sleep/insomnia  Physician notified of physical symptoms Yes  Referral to clinical psychology No  Referral to clinical social work No  Referral to dietition No  Referral to financial advocate No  Referral to support programs No  Referral to palliative care No  Other (No Data)    Clinical Social Worker follow up needed: Yes.    If yes, follow up plan: See above Loren Racer, [place] Worker [place]  [facility] Phone: [phone] Fax: [phone]

## 2015-06-10 NOTE — Addendum Note (Signed)
Encounter addended by: Jacqulyn Liner, RN on: 06/10/2015 10:46 AM<BR>     Documentation filed: Charges VN

## 2015-06-14 ENCOUNTER — Ambulatory Visit: Payer: Medicare Other | Admitting: Radiation Oncology

## 2015-06-15 ENCOUNTER — Ambulatory Visit: Payer: Medicare Other

## 2015-06-16 ENCOUNTER — Ambulatory Visit: Payer: Medicare Other

## 2015-06-16 DIAGNOSIS — Z510 Encounter for antineoplastic radiation therapy: Secondary | ICD-10-CM | POA: Diagnosis not present

## 2015-06-17 ENCOUNTER — Ambulatory Visit: Payer: Medicare Other | Admitting: Radiation Oncology

## 2015-06-17 ENCOUNTER — Ambulatory Visit: Payer: Medicare Other

## 2015-06-20 ENCOUNTER — Ambulatory Visit: Payer: Medicare Other

## 2015-06-20 ENCOUNTER — Ambulatory Visit
Admission: RE | Admit: 2015-06-20 | Discharge: 2015-06-20 | Disposition: A | Discharge status: Home / Self Care | Payer: Medicare Other | Source: Ambulatory Visit | Attending: Radiation Oncology | Admitting: Radiation Oncology

## 2015-06-20 DIAGNOSIS — Z510 Encounter for antineoplastic radiation therapy: Secondary | ICD-10-CM | POA: Diagnosis not present

## 2015-06-21 ENCOUNTER — Ambulatory Visit
Admission: RE | Admit: 2015-06-21 | Discharge: 2015-06-21 | Disposition: A | Discharge status: Home / Self Care | Payer: Medicare Other | Source: Ambulatory Visit | Attending: Radiation Oncology | Admitting: Radiation Oncology

## 2015-06-21 ENCOUNTER — Ambulatory Visit: Payer: Medicare Other

## 2015-06-21 ENCOUNTER — Encounter: Payer: Self-pay | Admitting: Radiation Oncology

## 2015-06-21 VITALS — BP 117/65 | HR 74 | Temp 97.6°F | Ht 66.0 in | Wt 185.1 lb

## 2015-06-21 DIAGNOSIS — C50411 Malignant neoplasm of upper-outer quadrant of right female breast: Secondary | ICD-10-CM

## 2015-06-21 DIAGNOSIS — Z510 Encounter for antineoplastic radiation therapy: Secondary | ICD-10-CM | POA: Diagnosis not present

## 2015-06-21 NOTE — Progress Notes (Signed)
1st treatment today.

## 2015-06-21 NOTE — Progress Notes (Signed)
Weekly Management Note Current Dose: 2.67  Gy  Projected Dose: 42.72 Gy   Narrative:  The patient presents for routine under treatment assessment.  CBCT/MVCT images/Port film x-rays were reviewed.  The chart was checked.  Physical Findings: Weight: 185 lb 1.6 oz (83.961 kg). No skin changes  Impression:  The patient is tolerating radiation.  Plan:  Continue treatment as planned. Start radiaplex.

## 2015-06-22 ENCOUNTER — Ambulatory Visit: Payer: Medicare Other

## 2015-06-22 ENCOUNTER — Ambulatory Visit
Admission: RE | Admit: 2015-06-22 | Discharge: 2015-06-22 | Disposition: A | Discharge status: Home / Self Care | Payer: Medicare Other | Source: Ambulatory Visit | Attending: Radiation Oncology | Admitting: Radiation Oncology

## 2015-06-22 DIAGNOSIS — Z510 Encounter for antineoplastic radiation therapy: Secondary | ICD-10-CM | POA: Diagnosis not present

## 2015-06-23 ENCOUNTER — Ambulatory Visit: Payer: Medicare Other

## 2015-06-23 ENCOUNTER — Ambulatory Visit
Admission: RE | Admit: 2015-06-23 | Discharge: 2015-06-23 | Disposition: A | Discharge status: Home / Self Care | Payer: Medicare Other | Source: Ambulatory Visit | Attending: Radiation Oncology | Admitting: Radiation Oncology

## 2015-06-23 DIAGNOSIS — Z510 Encounter for antineoplastic radiation therapy: Secondary | ICD-10-CM | POA: Diagnosis not present

## 2015-06-24 ENCOUNTER — Ambulatory Visit: Payer: Medicare Other

## 2015-06-24 ENCOUNTER — Ambulatory Visit
Admission: RE | Admit: 2015-06-24 | Discharge: 2015-06-24 | Disposition: A | Discharge status: Home / Self Care | Payer: Medicare Other | Source: Ambulatory Visit | Attending: Radiation Oncology | Admitting: Radiation Oncology

## 2015-06-24 DIAGNOSIS — Z510 Encounter for antineoplastic radiation therapy: Secondary | ICD-10-CM | POA: Diagnosis not present

## 2015-06-27 ENCOUNTER — Ambulatory Visit
Admission: RE | Admit: 2015-06-27 | Discharge: 2015-06-27 | Disposition: A | Discharge status: Home / Self Care | Payer: Medicare Other | Source: Ambulatory Visit | Attending: Radiation Oncology | Admitting: Radiation Oncology

## 2015-06-27 ENCOUNTER — Ambulatory Visit: Payer: Medicare Other

## 2015-06-27 DIAGNOSIS — Z510 Encounter for antineoplastic radiation therapy: Secondary | ICD-10-CM | POA: Diagnosis not present

## 2015-06-28 ENCOUNTER — Ambulatory Visit
Admission: RE | Admit: 2015-06-28 | Discharge: 2015-06-28 | Disposition: A | Discharge status: Home / Self Care | Payer: Medicare Other | Source: Ambulatory Visit | Attending: Radiation Oncology | Admitting: Radiation Oncology

## 2015-06-28 ENCOUNTER — Ambulatory Visit: Payer: Medicare Other

## 2015-06-28 ENCOUNTER — Telehealth: Payer: Self-pay | Admitting: *Deleted

## 2015-06-28 ENCOUNTER — Encounter: Payer: Self-pay | Admitting: Radiation Oncology

## 2015-06-28 VITALS — BP 121/93 | HR 86 | Temp 98.4°F | Resp 16 | Wt 184.0 lb

## 2015-06-28 DIAGNOSIS — Z510 Encounter for antineoplastic radiation therapy: Secondary | ICD-10-CM | POA: Diagnosis not present

## 2015-06-28 DIAGNOSIS — C50411 Malignant neoplasm of upper-outer quadrant of right female breast: Secondary | ICD-10-CM

## 2015-06-28 NOTE — Telephone Encounter (Signed)
Called pt to assess needs during xrt. Pt relate she is having some new breast pain. She will discuss this with Dr. Pablo Ledger today during her weekly review. Pt relate she is having some family stressors with her mother and best friend passing in the last week. Encourage pt call needs or questions. Received verbal understanding.

## 2015-06-28 NOTE — Progress Notes (Signed)
Weekly Management Note Current Dose: 16.02  Gy  Projected Dose: 42.72 Gy   Narrative:  The patient presents for routine under treatment assessment.  CBCT/MVCT images/Port film x-rays were reviewed.  The chart was checked. Right breast is tender and swollen. Feeling more comfortable with treatment now.   Physical Findings: Weight: 184 lb (83.462 kg). Skin around nipple is pink. Breast is heavy. Pt is afebrile.   Impression:  The patient is tolerating radiation.  Plan:  Continue treatment as planned. Monitor skin. Likely normal reaction to RT. If pink increases, consider antibiotics.

## 2015-06-28 NOTE — Progress Notes (Signed)
Weight and vitals are stable. Reports constant heaviness felt in right breast worst when she bends over. Reports the heaviness is always felt in addition to intermittent brief sharp shooting pain which the patient understands are related to the healing process. Warmth felt of right breast. Right nipple has become progressively more inverted. Hyperpigmentation most around nipple visualized. Uncertain if hyperpigmentation is progressive but, believes it may be. Reports constant headaches which she relates to "stiff neck." Afebrile.   BP 121/93 mmHg  Pulse 86  Temp(Src) 98.4 F (36.9 C) (Oral)  Resp 16  Wt 184 lb (83.462 kg)  SpO2 100% Wt Readings from Last 3 Encounters:  06/28/15 184 lb (83.462 kg)  06/21/15 185 lb 1.6 oz (83.961 kg)  06/02/15 181 lb 9.6 oz (82.373 kg)

## 2015-06-29 ENCOUNTER — Ambulatory Visit: Payer: Medicare Other

## 2015-06-29 ENCOUNTER — Ambulatory Visit
Admission: RE | Admit: 2015-06-29 | Discharge: 2015-06-29 | Disposition: A | Discharge status: Home / Self Care | Payer: Medicare Other | Source: Ambulatory Visit | Attending: Radiation Oncology | Admitting: Radiation Oncology

## 2015-06-29 DIAGNOSIS — Z510 Encounter for antineoplastic radiation therapy: Secondary | ICD-10-CM | POA: Diagnosis not present

## 2015-06-30 ENCOUNTER — Ambulatory Visit
Admission: RE | Admit: 2015-06-30 | Discharge: 2015-06-30 | Disposition: A | Discharge status: Home / Self Care | Payer: Medicare Other | Source: Ambulatory Visit | Attending: Radiation Oncology | Admitting: Radiation Oncology

## 2015-06-30 ENCOUNTER — Ambulatory Visit: Payer: Medicare Other

## 2015-06-30 DIAGNOSIS — Z510 Encounter for antineoplastic radiation therapy: Secondary | ICD-10-CM | POA: Diagnosis not present

## 2015-07-01 ENCOUNTER — Ambulatory Visit
Admission: RE | Admit: 2015-07-01 | Discharge: 2015-07-01 | Disposition: A | Discharge status: Home / Self Care | Payer: Medicare Other | Source: Ambulatory Visit | Attending: Radiation Oncology | Admitting: Radiation Oncology

## 2015-07-01 ENCOUNTER — Ambulatory Visit: Payer: Medicare Other

## 2015-07-01 DIAGNOSIS — Z510 Encounter for antineoplastic radiation therapy: Secondary | ICD-10-CM | POA: Diagnosis not present

## 2015-07-03 ENCOUNTER — Ambulatory Visit
Admission: RE | Admit: 2015-07-03 | Discharge: 2015-07-03 | Disposition: A | Discharge status: Home / Self Care | Payer: Medicare Other | Source: Ambulatory Visit | Attending: Radiation Oncology | Admitting: Radiation Oncology

## 2015-07-03 DIAGNOSIS — Z510 Encounter for antineoplastic radiation therapy: Secondary | ICD-10-CM | POA: Diagnosis not present

## 2015-07-04 ENCOUNTER — Ambulatory Visit: Payer: Medicare Other

## 2015-07-04 ENCOUNTER — Ambulatory Visit
Admission: RE | Admit: 2015-07-04 | Discharge: 2015-07-04 | Disposition: A | Discharge status: Home / Self Care | Payer: Medicare Other | Source: Ambulatory Visit | Attending: Radiation Oncology | Admitting: Radiation Oncology

## 2015-07-04 DIAGNOSIS — Z510 Encounter for antineoplastic radiation therapy: Secondary | ICD-10-CM | POA: Diagnosis not present

## 2015-07-05 ENCOUNTER — Ambulatory Visit
Admission: RE | Admit: 2015-07-05 | Discharge: 2015-07-05 | Disposition: A | Discharge status: Home / Self Care | Payer: Medicare Other | Source: Ambulatory Visit | Attending: Radiation Oncology | Admitting: Radiation Oncology

## 2015-07-05 ENCOUNTER — Ambulatory Visit: Payer: Medicare Other

## 2015-07-05 DIAGNOSIS — C50411 Malignant neoplasm of upper-outer quadrant of right female breast: Secondary | ICD-10-CM

## 2015-07-05 DIAGNOSIS — Z510 Encounter for antineoplastic radiation therapy: Secondary | ICD-10-CM | POA: Diagnosis not present

## 2015-07-05 NOTE — Progress Notes (Signed)
Weekly Management Note Current Dose: 32.04  Gy  Projected Dose: 42.72 Gy   Narrative:  The patient presents for routine under treatment assessment.  CBCT/MVCT images/Port film x-rays were reviewed.  The chart was checked. Right breast is less tender. Now itching medially.   Physical Findings: Weight:  . Skin around nipple is pink. Breast is pink. Dermatitis in upper inner quadrant.   Impression:  The patient is tolerating radiation.  Plan:  Continue treatment as planned. Hydrocortisone to medial breast.

## 2015-07-06 ENCOUNTER — Ambulatory Visit: Payer: Medicare Other

## 2015-07-06 ENCOUNTER — Ambulatory Visit
Admission: RE | Admit: 2015-07-06 | Discharge: 2015-07-06 | Disposition: A | Discharge status: Home / Self Care | Payer: Medicare Other | Source: Ambulatory Visit | Attending: Radiation Oncology | Admitting: Radiation Oncology

## 2015-07-06 DIAGNOSIS — Z510 Encounter for antineoplastic radiation therapy: Secondary | ICD-10-CM | POA: Diagnosis not present

## 2015-07-09 ENCOUNTER — Ambulatory Visit: Payer: Medicare Other

## 2015-07-11 ENCOUNTER — Ambulatory Visit
Admission: RE | Admit: 2015-07-11 | Discharge: 2015-07-11 | Disposition: A | Discharge status: Home / Self Care | Payer: Medicare Other | Source: Ambulatory Visit | Attending: Radiation Oncology | Admitting: Radiation Oncology

## 2015-07-11 DIAGNOSIS — Z510 Encounter for antineoplastic radiation therapy: Secondary | ICD-10-CM | POA: Diagnosis not present

## 2015-07-12 ENCOUNTER — Ambulatory Visit
Admission: RE | Admit: 2015-07-12 | Discharge: 2015-07-12 | Disposition: A | Discharge status: Home / Self Care | Payer: Medicare Other | Source: Ambulatory Visit | Attending: Radiation Oncology | Admitting: Radiation Oncology

## 2015-07-12 ENCOUNTER — Encounter: Payer: Self-pay | Admitting: Radiation Oncology

## 2015-07-12 VITALS — BP 114/47 | HR 80 | Temp 98.3°F | Ht 66.0 in | Wt 182.2 lb

## 2015-07-12 DIAGNOSIS — Z510 Encounter for antineoplastic radiation therapy: Secondary | ICD-10-CM | POA: Diagnosis not present

## 2015-07-12 DIAGNOSIS — C50411 Malignant neoplasm of upper-outer quadrant of right female breast: Secondary | ICD-10-CM

## 2015-07-12 NOTE — Addendum Note (Signed)
Encounter addended by: Malena Edman, RN on: 07/12/2015  5:44 PM<BR>     Documentation filed: Inpatient Patient Education

## 2015-07-12 NOTE — Progress Notes (Signed)
Jessica Roberts has completed 25 fractions.  States she is not having any pain today, has discomfort if she over extends her right arm.   Feels that the rash to breast and chest has improved this week  continous to use the Radiaplex geland Cortisone cream to her breast and chest.   Discussed survivorship program that is a support group after the radiation has been completed.  Mentioned the purchase of a vitamin E cream to use after the Radiplex has been used up completely.  Will have a F/U visit with Dr. Burr Medico her office will notify you about yhe appointment.  An appointment will be made for one month F/U with Dr. Pablo Ledger today.  BP 114/47 mmHg  Pulse 80  Temp(Src) 98.3 F (36.8 C) (Oral)  Ht 5\' 6"  (1.676 m)  Wt 182 lb 3.2 oz (82.645 kg)  BMI 29.42 kg/m2  SpO2 99%  Wt Readings from Last 3 Encounters:  07/12/15 182 lb 3.2 oz (82.645 kg)  06/28/15 184 lb (83.462 kg)  06/21/15 185 lb 1.6 oz (83.961 kg)

## 2015-07-12 NOTE — Progress Notes (Signed)
Weekly Management Note Current Dose: 40.05 Gy  Projected Dose: 42.72 Gy   Narrative:  The patient presents for routine under treatment assessment.  CBCT/MVCT images/Port film x-rays were reviewed.  The chart was checked. Anxious and more sad than she thought now that she is finishing tx. Has information re: FYNN. Grateful for care.   Physical Findings: Weight: 182 lb 3.2 oz (82.645 kg). Skin around nipple is pink. Breast is pink. Dermatitis in upper inner quadrant.   Impression:  The patient is tolerating radiation.  Plan:  Continue treatment as planned. Hydrocortisone to medial breast. Discussed post RT skin care. Discussed survivorship.

## 2015-07-13 ENCOUNTER — Ambulatory Visit: Payer: Medicare Other

## 2015-07-13 ENCOUNTER — Ambulatory Visit
Admission: RE | Admit: 2015-07-13 | Discharge: 2015-07-13 | Disposition: A | Discharge status: Home / Self Care | Payer: Medicare Other | Source: Ambulatory Visit | Attending: Radiation Oncology | Admitting: Radiation Oncology

## 2015-07-13 ENCOUNTER — Encounter: Payer: Self-pay | Admitting: Radiation Oncology

## 2015-07-13 DIAGNOSIS — Z510 Encounter for antineoplastic radiation therapy: Secondary | ICD-10-CM | POA: Diagnosis not present

## 2015-07-14 ENCOUNTER — Ambulatory Visit: Payer: Medicare Other

## 2015-07-15 ENCOUNTER — Other Ambulatory Visit: Payer: Self-pay | Admitting: Adult Health

## 2015-07-15 ENCOUNTER — Telehealth: Payer: Self-pay | Admitting: Hematology

## 2015-07-15 DIAGNOSIS — C50411 Malignant neoplasm of upper-outer quadrant of right female breast: Secondary | ICD-10-CM

## 2015-07-15 NOTE — Telephone Encounter (Signed)
Called patient with appointment and her phone has no voicemail,calendar and letter made

## 2015-07-18 ENCOUNTER — Telehealth: Payer: Self-pay | Admitting: *Deleted

## 2015-07-18 NOTE — Telephone Encounter (Signed)
Attempted to call patient twice.  Her voicemail states it has not been set up and I'm not able to leave a message.  Just wanted to call and congratulate her on finishing XRT.

## 2015-07-18 NOTE — Telephone Encounter (Signed)
Received call back from patient.  She states she is so glad to be done with XRT.  She is doing well.  Encouraged her to call with any needs or concerns.

## 2015-07-20 ENCOUNTER — Ambulatory Visit: Payer: Medicare Other

## 2015-07-27 NOTE — Progress Notes (Signed)
  Radiation Oncology         [phone] ________________________________  Name: Jessica Roberts MRN: [id number]  Date: 07/13/2015  DOB: October 06, 1945  End of Treatment Note  Diagnosis:  Breast cancer of upper-outer quadrant of right female breast [facility])   Staging form: Breast, AJCC 7th Edition     Clinical stage from 04/07/2015: Stage IA (T1a, N0, M0) - Signed by Truitt Merle, MD on 05/20/2015     Pathologic stage from 04/27/2015: Stage IA (T1b, N0, cM0) - Signed by Truitt Merle, MD on 05/20/2015    Indication for treatment:  Curative    Radiation treatment dates:   06/21/15-07/13/15  Site/dose:   Right breast/ 42.72 Gy at 2.67 Gy per fraction x 21 fractions.   Beams/energy:  Opposed tangents with reduced fields / 6 and 10 MV photons  Narrative: The patient tolerated radiation treatment relatively well.   She had some anxiety related to treatment. She had dermatitis medially which was treated with hydrocortisone.   Plan: The patient has completed radiation treatment. The patient will return to radiation oncology clinic for routine followup in one month. I advised them to call or return sooner if they have any questions or concerns related to their recovery or treatment.  ------------------------------------------------  Thea Silversmith, MD

## 2015-08-18 ENCOUNTER — Ambulatory Visit
Admission: RE | Admit: 2015-08-18 | Discharge: 2015-08-18 | Disposition: A | Discharge status: Home / Self Care | Payer: Medicare Other | Source: Ambulatory Visit | Attending: Radiation Oncology | Admitting: Radiation Oncology

## 2015-08-18 ENCOUNTER — Encounter: Payer: Self-pay | Admitting: Radiation Oncology

## 2015-08-18 VITALS — BP 107/71 | HR 91 | Temp 98.3°F | Resp 12 | Wt 182.9 lb

## 2015-08-18 DIAGNOSIS — C50411 Malignant neoplasm of upper-outer quadrant of right female breast: Secondary | ICD-10-CM

## 2015-08-18 NOTE — Progress Notes (Addendum)
She is currently in no pain. Occasionally "twingy" pain to right breast incision. Pt complains of fatigue .  Pt right breast- warm dry and intact.  Pt denies edema. Pt continues to apply Radiaplex as needed.  BP 107/71 mmHg  Pulse 91  Temp(Src) 98.3 F (36.8 C) (Oral)  Resp 12  Wt 182 lb 14.4 oz (82.963 kg)  SpO2 100% Wt Readings from Last 3 Encounters:  08/18/15 182 lb 14.4 oz (82.963 kg)  07/12/15 182 lb 3.2 oz (82.645 kg)  06/28/15 184 lb (83.462 kg)   Pt reports: Yes No Comments  Tamoxifen []  [x]    Arimidex []  [x]    Mammogram [x]   Date:last 04/07/15 []     Waiting call back from Med Onc Dr Burr Medico to schedule a follow up appointment Suvivorship: 09/29/15 [facility] [employer] and survivorship pamphlets given.

## 2015-08-18 NOTE — Progress Notes (Signed)
   Department of Radiation Oncology  Phone:  [phone] Fax:        [phone]   Name: Jessica Roberts MRN: [id number]  DOB: Oct 31, 1945  Date: 08/18/2015  Follow Up Visit Note  Diagnosis: Breast cancer of upper-outer quadrant of right female breast [facility])   Staging form: Breast, AJCC 7th Edition     Clinical stage from 04/07/2015: Stage IA (T1a, N0, M0) - Signed by Truitt Merle, MD on 05/20/2015     Pathologic stage from 04/27/2015: Stage IA (T1b, N0, cM0) - Signed by Truitt Merle, MD on 05/20/2015   Summary and Interval since last radiation: 06/21/15-07/13/15  Site/dose: Right breast/ 42.72 Gy at 2.67 Gy per fraction x 21 fractions.   Interval History: Jessica Roberts presents today for routine followup. She is currently in no pain, but has occasional "twingy" pain to the right breast incision. She complains of fatigue. She denies edema. She continues to apply Radiaplex as needed.  She is waiting for a call from Med Onc Dr Burr Medico to schedule a follow up appointment. Therefore she has not started the aromatase inhibitor. She has a survivorship appointment on 09/29/15. She is in good spirits.   Physical Exam:  Filed Vitals:   08/18/15 1505  BP: 107/71  Pulse: 91  Temp: 98.3 F (36.8 C)  TempSrc: Oral  Resp: 12  Weight: 182 lb 14.4 oz (82.963 kg)  SpO2: 100%  Alert and oriented x3. In no acute distress. Her right breast is well healed and pink. No hyperpigmentation.  IMPRESSION: Jessica Roberts is a 70 y.o. female with resolving acute effects of radiation treatment. She is doing well.  PLAN: She is doing well. We discussed the need for follow up every 4-6 months which she has scheduled.  We discussed the need for yearly mammograms which she can schedule with her OBGYN or with medical oncology. We discussed the need for sun protection in the treated area.  She can always call me with questions.  I will follow up with her on an as needed basis. I will contact the navigators who will then contact her with  a follow up visit with Dr. Burr Medico soon. I also encouraged her to attend our [facility] class.  Thea Silversmith, MD  This document serves as a record of services personally performed by Thea Silversmith, MD. It was created on her behalf by Darcus Austin, a trained medical scribe. The creation of this record is based on the scribe's personal observations and the provider's statements to them. This document has been checked and approved by the attending provider.

## 2015-09-01 ENCOUNTER — Ambulatory Visit: Payer: Medicare Other | Attending: Family Medicine | Admitting: Physical Therapy

## 2015-09-01 DIAGNOSIS — M62838 Other muscle spasm: Secondary | ICD-10-CM

## 2015-09-01 DIAGNOSIS — M436 Torticollis: Secondary | ICD-10-CM | POA: Insufficient documentation

## 2015-09-01 DIAGNOSIS — M6248 Contracture of muscle, other site: Secondary | ICD-10-CM | POA: Insufficient documentation

## 2015-09-01 DIAGNOSIS — R29898 Other symptoms and signs involving the musculoskeletal system: Secondary | ICD-10-CM | POA: Diagnosis present

## 2015-09-01 DIAGNOSIS — M25611 Stiffness of right shoulder, not elsewhere classified: Secondary | ICD-10-CM

## 2015-09-01 NOTE — Therapy (Signed)
[facility] [address] [place], [place], [address] Phone: [phone]   Fax:  [phone]  Physical Therapy Evaluation  Patient Details  Name: Jessica Roberts MRN: [id number] Date of Birth: 02-Dec-1945 Referring Provider: Dr. Addison Lank   Encounter Date: 09/01/2015      PT End of Session - 09/01/15 1535    Visit Number 1   Number of Visits 10   Date for PT Re-Evaluation 10/27/15   Authorization Type BCBS Medicare G codes; KX at visit 15   PT Start Time 1100   PT Stop Time 1145   PT Time Calculation (min) 45 min   Activity Tolerance Patient tolerated treatment well      Past Medical History  Diagnosis Date  . Anxiety   . Hypertension   . Anxiety and depression   . Breast cancer of upper-outer quadrant of right female breast ([place]) 04/11/2015  . Dengue fever   . Malaria   . Typhoid   . Depression   . Complication of anesthesia     "had a very hard time waking up"  . PONV (postoperative nausea and vomiting)   . GERD (gastroesophageal reflux disease)   . Headache   . Head injury, closed, with concussion ([place]) 1986    in [place]  . Head injury 1995    concussion in [place], med evac to [place], ear damage with vertigo  . Head injury, acute, with loss of consciousness ([place]) 2000    [place]  . Fall     broken nose  . Broken nose   . Breast cancer ([place]) 04/07/15    right breast  . Allergy     Past Surgical History  Procedure Laterality Date  . Surgical procedure for endometriosis    . Tonsillectomy    . Colonoscopy w/ biopsies and polypectomy    . Nerve surgery      '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' during surgery for endometriosis  . Breast lumpectomy with needle localization and axillary sentinel lymph node bx Right 04/27/2015    Procedure: RIGHT BREAST LUMPECTOMY WITH  TWO NEEDLE LOCALIZATION AND TWO RIGHT AXILLARY SENTINEL LYMPH NODE BX;  Surgeon: Fanny Skates, MD;  Location: [facility];   Service: General;  Laterality: Right;    There were no vitals filed for this visit.  Visit Diagnosis:  Neck muscle spasm - Plan: PT plan of care cert/re-cert  Neck stiffness - Plan: PT plan of care cert/re-cert  Shoulder stiffness, right - Plan: PT plan of care cert/re-cert  Weakness of right upper extremity - Plan: PT plan of care cert/re-cert      Subjective Assessment - 09/01/15 1103    Subjective Right neck pain in May or June. Right posterior or frontal headaches.   No apparent reason, woke up with it.   Went to [employer] 2x slightly better.   Tried 3x acupuncture with min change.  Over time same, maybe slightly worse.  Good days and bad days.  2016 lot of stress with deaths in family/friends.  Breast CA in Aug with surgery (lumpectomy) deep 2 incisions in September.   Finished radiation, last session in early December.  Patient states "it's gone."  Also with left elbow pain, doctor told her to get tennis elbow brace    Pertinent History Several close head injuries with last 2014.  Breast CA 2016;  hx of vertigo (had some in December)    Limitations Lifting  driving turning head to back up;sleeping  How long can you sit comfortably? doesn't increase pain   How long can you walk comfortably? not aggravated by walking   Diagnostic tests none   Patient Stated Goals cure me and make me whole   Currently in Pain? Yes   Pain Score 8    Pain Location Neck   Pain Orientation Right   Pain Descriptors / Indicators Constant;Tender   Pain Type Chronic pain   Pain Onset More than a month ago   Pain Frequency Constant   Aggravating Factors  lifted something while turning;  night/sleeping   Pain Relieving Factors being sedentary; heat; hot shower            [facility] PT Assessment - 09/01/15 1117    Assessment   Medical Diagnosis neck muscle spasm   Referring Provider Dr. Addison Lank    Onset Date/Surgical Date --  May/June   Hand Dominance Right   Next MD Visit none scheduled   Prior  Therapy acupunture, chiro, PT long time ago for neck    Precautions   Precautions None   Precaution Comments I can do anything within comfort   Restrictions   Weight Bearing Restrictions No   Balance Screen   Has the patient fallen in the past 6 months No   Has the patient had a decrease in activity level because of a fear of falling?  No   Is the patient reluctant to leave their home because of a fear of falling?  No   Home Ec[place] residence   Prior Function   Level of Independence Independent   Vocation Part time employment   Barrister's clerk at [employer]   Observation/Other Assessments   Focus on Therapeutic Outcomes (FOTO)  55% limitation   ROM / Strength   AROM / PROM / Strength AROM;Strength   AROM   AROM Assessment Site Shoulder;Cervical   Right/Left Shoulder Right;Left   Right Shoulder Flexion 140 Degrees   Right Shoulder ABduction 110 Degrees   Right Shoulder Internal Rotation --  T 10   Right Shoulder External Rotation 90 Degrees   Left Shoulder Flexion 165 Degrees   Left Shoulder ABduction 170 Degrees   Left Shoulder External Rotation 90 Degrees   Left Shoulder Horizontal ABduction --  T6   Cervical Flexion 60   Cervical Extension 43   Cervical - Right Side Bend 23   Cervical - Left Side Bend 18   Cervical - Right Rotation 60   Cervical - Left Rotation 63   Strength   Strength Assessment Site Shoulder;Cervical   Right/Left Shoulder Right;Left   Right Shoulder Flexion 4-/5   Right Shoulder Extension 4-/5   Right Shoulder ABduction 4-/5   Right Shoulder Internal Rotation 4-/5   Right Shoulder External Rotation 4-/5   Right Shoulder Horizontal ABduction 4-/5   Left Shoulder Flexion 4+/5   Left Shoulder Extension 4+/5   Left Shoulder ABduction 4+/5   Cervical Flexion 4-/5   Cervical Extension 4-/5   Palpation   Palpation comment tender right upper trap, suboccipitals   Special Tests    Cervical Tests Dictraction   Distraction Test   Findngs Positive                           PT Education - 09/01/15 1534    Education provided Yes   Education Details dry needling info   Person(s) Educated Patient   Methods Explanation;Handout  Comprehension Verbalized understanding          PT Short Term Goals - 09/01/15 1555    PT SHORT TERM GOAL #1   Title The patient will be able to participate in an initial HEP for ROM and early strengthening  (09/29/15)   Time 4   Period Weeks   Status New   PT SHORT TERM GOAL #2   Title The patient will have improved shoulder flexion/elevation to 150 degrees 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[employer])   Time 4   Period Weeks   Status New   PT [place] #3   Title Cervical sidebending improved to 30 degrees needed or dressing   Time 4   Period Weeks   Status New   PT SHORT TERM GOAL #4   Title Pain improved by 25% with home and work activities   Time 4   Period Weeks   Status New           PT Long Term Goals - 09/01/15 1712    PT LONG TERM GOAL #1   Title The patient will be independent in safe, self progression of HEP for further improvements in ROM, strength and pain reduction  10/27/15   Time 8   Period Weeks   Status New   PT LONG TERM GOAL #2   Title The patient will have improved right shoulder flexion/elevation to 160 degrees needed for overhead reaching into cabinets and the white board at work   Time 8   Period Weeks   Status New   PT [place] #3   Title Patient will have improved right glenohumeral, scapular and cervical strength to grossly 4/5 needed for lifting medium weight objects   Time 8   Period Weeks   Status New   PT LONG TERM GOAL #4   Title Patient will report a > 50% improvement in sleep   Time 8   Period Weeks   Status New   PT LONG TERM GOAL #5   Title Patient will report average pain with home and work ADLS [id number]    Time 8   Period  Weeks   Additional Long Term Goals   Additional Long Term Goals Yes   PT LONG TERM GOAL #6   Title FOTO functional outcome score improved from 55% limitation to 44% indicating improved function with less pain   Time 8   Period Weeks   Status New               Plan - 09/01/15 1536    Clinical Impression Statement The patient presents for a moderate complexity evaluation for right neck/upper trap region pain.  She awoke with pain in May/June with difficulty turning her head and difficulty sleeping.  She was diagnosed with breast cancer in August  and underwent surgery in September (lumpectomy).  She completed radiation in early December.  She reports a worsening of pain during those months.  Pain is constant in right neck with intermittent headaches in suboccipital region as well as frontal region.  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  Her cervical AROM is limited as well in extension and R/L sidebending.  Forward head posture.    Right shoulder strength as well as deep cervical flexors and extensors grossly 4-/5.  She would benefit from PT to address these defictis.    Pt will benefit  from skilled therapeutic intervention in order to improve on the following deficits Decreased endurance;Decreased range of motion;Decreased strength;Impaired UE functional use;Pain;Increased muscle spasms   Rehab Potential Good   Clinical Impairments Affecting Rehab Potential Breast CA with surgery (patient reports short term memory deficits as a result)   hx of 2 head injuries, hx of vertigo   PT Frequency 2x / week   PT Duration 8 weeks   PT Treatment/Interventions ADLs/Self Care Home Management;Cryotherapy;Electrical Stimulation;Moist Heat;Ultrasound;Therapeutic exercise;Patient/family education;Taping;Dry needling;Manual techniques;Traction   PT Next Visit Plan upper trap stretching, cervical retractions for postural correction; manual  techniques for soft tissue upper traps and suboccipitals, manual cervical distraction;  dry needling          G-Codes - Sep 10, 2015 1554    Functional Assessment Tool Used FOTO; clinical judgement   Functional Limitation Carrying, moving and handling objects   Carrying, Moving and Handling Objects Current Status [id number]) At least 40 percent but less than 60 percent impaired, limited or restricted   Carrying, Moving and Handling Objects Goal Status [id number]) At least 20 percent but less than 40 percent impaired, limited or restricted       Problem List Patient Active Problem List   Diagnosis Date Noted  . Breast cancer of upper-outer quadrant of right female breast ([place]) 04/11/2015    Jessica Roberts 09-10-2015, 5:18 PM  [facility] [address] [place], [place], [address] Phone: [phone]   Fax:  [phone]  Name: Jessica Roberts MRN: [id number] Date of Birth: April 28, 1946   Ruben Im, PT Sep 10, 2015 5:18 PM Phone: [phone] Fax: [phone]

## 2015-09-01 NOTE — Patient Instructions (Signed)
Dry needling info

## 2015-09-06 ENCOUNTER — Ambulatory Visit: Payer: Medicare Other | Admitting: Physical Therapy

## 2015-09-06 DIAGNOSIS — R29898 Other symptoms and signs involving the musculoskeletal system: Secondary | ICD-10-CM

## 2015-09-06 DIAGNOSIS — M6248 Contracture of muscle, other site: Secondary | ICD-10-CM | POA: Diagnosis not present

## 2015-09-06 DIAGNOSIS — M25611 Stiffness of right shoulder, not elsewhere classified: Secondary | ICD-10-CM

## 2015-09-06 DIAGNOSIS — M62838 Other muscle spasm: Secondary | ICD-10-CM

## 2015-09-06 DIAGNOSIS — M436 Torticollis: Secondary | ICD-10-CM

## 2015-09-06 NOTE — Therapy (Signed)
[facility] [address] [place], [place], [address] Phone: [phone]   Fax:  [phone]  Physical Therapy Treatment  Patient Details  Name: Jessica Roberts MRN: [id number] Date of Birth: Dec 19, 1945 Referring Provider: Dr. Addison Lank   Encounter Date: 09/06/2015      PT End of Session - 09/06/15 1318    Visit Number 2   Number of Visits 10   Date for PT Re-Evaluation 10/27/15   Authorization Type BCBS Medicare G codes; KX at visit 15   PT Start Time 1230   PT Stop Time 1325   PT Time Calculation (min) 55 min   Activity Tolerance Patient tolerated treatment well      Past Medical History  Diagnosis Date  . Anxiety   . Hypertension   . Anxiety and depression   . Breast cancer of upper-outer quadrant of right female breast ([place]) 04/11/2015  . Dengue fever   . Malaria   . Typhoid   . Depression   . Complication of anesthesia     "had a very hard time waking up"  . PONV (postoperative nausea and vomiting)   . GERD (gastroesophageal reflux disease)   . Headache   . Head injury, closed, with concussion ([place]) 1986    in [place]  . Head injury 1995    concussion in [place], med evac to [place], ear damage with vertigo  . Head injury, acute, with loss of consciousness ([place]) 2000    [place]  . Fall     broken nose  . Broken nose   . Breast cancer ([place]) 04/07/15    right breast  . Allergy     Past Surgical History  Procedure Laterality Date  . Surgical procedure for endometriosis    . Tonsillectomy    . Colonoscopy w/ biopsies and polypectomy    . Nerve surgery      '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' during surgery for endometriosis  . Breast lumpectomy with needle localization and axillary sentinel lymph node bx Right 04/27/2015    Procedure: RIGHT BREAST LUMPECTOMY WITH  TWO NEEDLE LOCALIZATION AND TWO RIGHT AXILLARY SENTINEL LYMPH NODE BX;  Surgeon: Fanny Skates, MD;  Location: [facility];   Service: General;  Laterality: Right;    There were no vitals filed for this visit.  Visit Diagnosis:  Neck muscle spasm  Neck stiffness  Shoulder stiffness, right  Weakness of right upper extremity      Subjective Assessment - 09/06/15 1236    Subjective Went to yoga last night and did OK.     Pertinent History Several close head injuries with last 2014.  Breast CA 2016;  hx of vertigo (had some in December)    Currently in Pain? Yes   Pain Score 6    Pain Orientation Right   Pain Type Chronic pain   Pain Frequency Constant                         [facility] Adult PT Treatment/Exercise - 09/06/15 1243    Exercises   Exercises Neck   Neck Exercises: Seated   Neck Retraction 5 reps   Other Seated Exercise right upper trap stretch 3x 20 sec   Neck Exercises: Supine   Neck Retraction 10 reps   Modalities   Modalities Electrical Stimulation;Moist Heat   Manual Therapy   Manual Therapy Soft tissue mobilization;Manual Traction;Muscle Energy Technique   Soft tissue mobilization right upper  trap, levator scap, cerv multifidi, suboccipitals   Manual Traction 3x 25 sec   Muscle Energy Technique right upper trap 6x 5 sec holds          Trigger Point Dry Needling - 09/06/15 1317    Consent Given? Yes   Education Handout Provided Yes   Muscles Treated Upper Body Upper trapezius;Levator scapulae  right cervical multifidi   Upper Trapezius Response Twitch reponse elicited;Palpable increased muscle length   Levator Scapulae Response Twitch response elicited;Palpable increased muscle length       right side only       PT Education - 09/06/15 1315    Education provided Yes   Education Details upper trap stretch, supine retractions, dry needling aftercare   Person(s) Educated Patient   Methods Explanation;Demonstration;Handout   Comprehension Verbalized understanding;Returned demonstration          PT Short Term Goals - 09/06/15 1358    PT SHORT TERM  GOAL #1   Title The patient will be able to participate in an initial HEP for ROM and early strengthening  (09/29/15)   Time 4   Period Weeks   Status On-going   PT SHORT TERM GOAL #2   Title The patient will have improved shoulder flexion/elevation to 150 degrees 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[employer])   Time 4   Period Weeks   Status On-going   PT [place] #3   Title Cervical sidebending improved to 30 degrees needed or dressing   Time 4   Period Weeks   Status On-going   PT SHORT TERM GOAL #4   Title Pain improved by 25% with home and work activities   Time 4   Period Weeks   Status On-going           PT Long Term Goals - 09/06/15 1359    PT LONG TERM GOAL #1   Title The patient will be independent in safe, self progression of HEP for further improvements in ROM, strength and pain reduction  10/27/15   Time 8   Period Weeks   Status On-going   PT LONG TERM GOAL #2   Title The patient will have improved right shoulder flexion/elevation to 160 degrees needed for overhead reaching into cabinets and the white board at work   Time 8   Period Weeks   Status On-going   PT [place] #3   Title Patient will have improved right glenohumeral, scapular and cervical strength to grossly 4/5 needed for lifting medium weight objects   Time 8   Period Weeks   Status On-going   PT LONG TERM GOAL #4   Title Patient will report a > 50% improvement in sleep   Time 8   Period Weeks   Status On-going   PT LONG TERM GOAL #5   Title Patient will report average pain with home and work ADLS [id number]    Time 8   Period Weeks   Status On-going   PT LONG TERM GOAL #6   Title FOTO functional outcome score improved from 55% limitation to 44% indicating improved function with less pain   Time 8   Period Weeks   Status On-going               Plan - 09/06/15 1318    Clinical Impression Statement The patient continues to have right upper trap region  pain.  She is receptive to dry needling and manual therapy.  Twitch responses produced and improved soft tissue length following dry needling.  Good pain relief with heat/estim.  Therapist closely monitoring response throughout treatment session.     PT Next Visit Plan assess response to 1st dry needling;  continue manual therapy; postural strengthening, heat/stim as needed.  kinesiotaping?        Problem List Patient Active Problem List   Diagnosis Date Noted  . Breast cancer of upper-outer quadrant of right female breast ([place]) 04/11/2015    Alvera Singh 09/06/2015, 2:02 PM  [facility] [address] [place], [place], [address] Phone: [phone]   Fax:  [phone]  Name: Jessica Roberts MRN: [id number] Date of Birth: 1946/03/04   Ruben Im, PT 09/06/2015 2:03 PM Phone: [phone] Fax: [phone]

## 2015-09-06 NOTE — Patient Instructions (Signed)

## 2015-09-08 ENCOUNTER — Ambulatory Visit: Payer: Medicare Other

## 2015-09-08 DIAGNOSIS — M6248 Contracture of muscle, other site: Secondary | ICD-10-CM | POA: Diagnosis not present

## 2015-09-08 DIAGNOSIS — M62838 Other muscle spasm: Secondary | ICD-10-CM

## 2015-09-08 DIAGNOSIS — M25611 Stiffness of right shoulder, not elsewhere classified: Secondary | ICD-10-CM

## 2015-09-08 DIAGNOSIS — M436 Torticollis: Secondary | ICD-10-CM

## 2015-09-08 DIAGNOSIS — R29898 Other symptoms and signs involving the musculoskeletal system: Secondary | ICD-10-CM

## 2015-09-08 NOTE — Therapy (Signed)
[facility] [address], [place], [place], [address] Phone: [phone]   Fax:  [phone]  Physical Therapy Treatment  Patient Details  Name: Jessica Roberts MRN: [id number] Date of Birth: 01-20-1946 Referring Provider: Dr. Addison Lank   Encounter Date: 09/08/2015      PT End of Session - 09/08/15 1308    Visit Number 3   Number of Visits 10   Date for PT Re-Evaluation 10/27/15   Authorization Type BCBS Medicare G codes; KX at visit 15   PT Start Time 1230   PT Stop Time 1304   PT Time Calculation (min) 34 min   Activity Tolerance Patient tolerated treatment well   Behavior During Therapy [facility] for tasks assessed/performed      Past Medical History  Diagnosis Date  . Anxiety   . Hypertension   . Anxiety and depression   . Breast cancer of upper-outer quadrant of right female breast ([place]) 04/11/2015  . Dengue fever   . Malaria   . Typhoid   . Depression   . Complication of anesthesia     "had a very hard time waking up"  . PONV (postoperative nausea and vomiting)   . GERD (gastroesophageal reflux disease)   . Headache   . Head injury, closed, with concussion ([place]) 1986    in [place]  . Head injury 1995    concussion in [place], med evac to [place], ear damage with vertigo  . Head injury, acute, with loss of consciousness ([place]) 2000    [place]  . Fall     broken nose  . Broken nose   . Breast cancer ([place]) 04/07/15    right breast  . Allergy     Past Surgical History  Procedure Laterality Date  . Surgical procedure for endometriosis    . Tonsillectomy    . Colonoscopy w/ biopsies and polypectomy    . Nerve surgery      '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' during surgery for endometriosis  . Breast lumpectomy with needle localization and axillary sentinel lymph node bx Right 04/27/2015    Procedure: RIGHT BREAST LUMPECTOMY WITH  TWO NEEDLE LOCALIZATION AND TWO RIGHT AXILLARY SENTINEL LYMPH  NODE BX;  Surgeon: Fanny Skates, MD;  Location: [place];  Service: General;  Laterality: Right;    There were no vitals filed for this visit.  Visit Diagnosis:  Neck muscle spasm  Neck stiffness  Shoulder stiffness, right  Weakness of right upper extremity      Subjective Assessment - 09/08/15 1232    Subjective Pt reports that needling was "good and bad".  Some soreness and it feels better now.     Currently in Pain? Yes   Pain Score 4    Pain Location Neck   Pain Orientation Right   Pain Descriptors / Indicators Tender;Constant   Pain Type Chronic pain   Pain Onset More than a month ago   Pain Frequency Constant   Aggravating Factors  moving head   Pain Relieving Factors ibuprofen            [facility] PT Assessment - 09/08/15 0001    Precautions   Precautions Other (comment)   Precaution Comments breast cancer history: no arm bike or [place]                     [facility] Adult PT Treatment/Exercise - 09/08/15 0001    Neck Exercises: Seated   Other Seated Exercise  right upper trap stretch 3x 20 sec   Other Seated Exercise cervical rotation to the Lt 3x20 seconds   Neck Exercises: Supine   Neck Retraction 10 reps   Manual Therapy   Manual Therapy Soft tissue mobilization;Manual Traction   Soft tissue mobilization right upper trap, levator scap, suboccipitals: elongation and trigger point release   Manual Traction 3x 25 sec                PT Education - 09/08/15 1307    Education provided Yes   Education Details posture   Person(s) Educated Patient   Methods Explanation   Comprehension Verbalized understanding          PT Short Term Goals - 09/06/15 1358    PT SHORT TERM GOAL #1   Title The patient will be able to participate in an initial HEP for ROM and early strengthening  (09/29/15)   Time 4   Period Weeks   Status On-going   PT SHORT TERM GOAL #2   Title The patient will have improved shoulder flexion/elevation to 150 degrees needed for  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[employer])   Time 4   Period Weeks   Status On-going   PT [place] #3   Title Cervical sidebending improved to 30 degrees needed or dressing   Time 4   Period Weeks   Status On-going   PT SHORT TERM GOAL #4   Title Pain improved by 25% with home and work activities   Time 4   Period Weeks   Status On-going           PT Long Term Goals - 09/06/15 1359    PT LONG TERM GOAL #1   Title The patient will be independent in safe, self progression of HEP for further improvements in ROM, strength and pain reduction  10/27/15   Time 8   Period Weeks   Status On-going   PT LONG TERM GOAL #2   Title The patient will have improved right shoulder flexion/elevation to 160 degrees needed for overhead reaching into cabinets and the white board at work   Time 8   Period Weeks   Status On-going   PT [place] #3   Title Patient will have improved right glenohumeral, scapular and cervical strength to grossly 4/5 needed for lifting medium weight objects   Time 8   Period Weeks   Status On-going   PT LONG TERM GOAL #4   Title Patient will report a > 50% improvement in sleep   Time 8   Period Weeks   Status On-going   PT LONG TERM GOAL #5   Title Patient will report average pain with home and work ADLS [id number]    Time 8   Period Weeks   Status On-going   PT LONG TERM GOAL #6   Title FOTO functional outcome score improved from 55% limitation to 44% indicating improved function with less pain   Time 8   Period Weeks   Status On-going               Plan - 09/08/15 1235    Clinical Impression Statement Pt with continued Rt UT tension/trigger point and pain.  Pt reports that she was sore after needling but it feels better now.  Pt has been performing HEP for postural strength and flexibility.  Pt will continue to benefit from skilled PT for flexibility and strength progression and manual, dry needling  and modalities PRN.   Pt will  benefit from skilled therapeutic intervention in order to improve on the following deficits Decreased endurance;Decreased range of motion;Decreased strength;Impaired UE functional use;Pain;Increased muscle spasms   Clinical Impairments Affecting Rehab Potential Breast CA with surgery (patient reports short term memory deficits as a result)   hx of 2 head injuries, hx of vertigo   PT Frequency 2x / week   PT Duration 8 weeks   PT Treatment/Interventions ADLs/Self Care Home Management;Cryotherapy;Electrical Stimulation;Moist Heat;Ultrasound;Therapeutic exercise;Patient/family education;Taping;Dry needling;Manual techniques;Traction   PT Next Visit Plan  continue manual therapy; postural strengthening, heat/stim as needed.          Problem List Patient Active Problem List   Diagnosis Date Noted  . Breast cancer of upper-outer quadrant of right female breast ([place]) 04/11/2015    TAKACS,KELLY, PT 09/08/2015, 1:09 PM  [facility] [address], [place], [place], [address] Phone: [phone]   Fax:  [phone]  Name: Dajha Gelardi MRN: [id number] Date of Birth: 21-Oct-1945

## 2015-09-13 ENCOUNTER — Ambulatory Visit: Payer: Medicare Other | Admitting: Physical Therapy

## 2015-09-13 DIAGNOSIS — M436 Torticollis: Secondary | ICD-10-CM

## 2015-09-13 DIAGNOSIS — M62838 Other muscle spasm: Secondary | ICD-10-CM

## 2015-09-13 DIAGNOSIS — R29898 Other symptoms and signs involving the musculoskeletal system: Secondary | ICD-10-CM

## 2015-09-13 DIAGNOSIS — M25611 Stiffness of right shoulder, not elsewhere classified: Secondary | ICD-10-CM

## 2015-09-13 DIAGNOSIS — M6248 Contracture of muscle, other site: Secondary | ICD-10-CM | POA: Diagnosis not present

## 2015-09-13 NOTE — Therapy (Signed)
[facility] [address] [place], [place], [address] Phone: [phone]   Fax:  [phone]  Physical Therapy Treatment  Patient Details  Name: Jessica Roberts MRN: [id number] Date of Birth: 20-Nov-1945 Referring Provider: Dr. Addison Lank   Encounter Date: 09/13/2015      PT End of Session - 09/13/15 1315    Visit Number 4   Number of Visits 10   Date for PT Re-Evaluation 10/27/15   Authorization Type BCBS Medicare G codes; KX at visit 15   PT Start Time 1232   PT Stop Time 1320   PT Time Calculation (min) 48 min   Activity Tolerance Patient tolerated treatment well      Past Medical History  Diagnosis Date  . Anxiety   . Hypertension   . Anxiety and depression   . Breast cancer of upper-outer quadrant of right female breast ([place]) 04/11/2015  . Dengue fever   . Malaria   . Typhoid   . Depression   . Complication of anesthesia     "had a very hard time waking up"  . PONV (postoperative nausea and vomiting)   . GERD (gastroesophageal reflux disease)   . Headache   . Head injury, closed, with concussion ([place]) 1986    in [place]  . Head injury 1995    concussion in [place], med evac to [place], ear damage with vertigo  . Head injury, acute, with loss of consciousness ([place]) 2000    [place] the country  . Fall     broken nose  . Broken nose   . Breast cancer ([place]) 04/07/15    right breast  . Allergy     Past Surgical History  Procedure Laterality Date  . Surgical procedure for endometriosis    . Tonsillectomy    . Colonoscopy w/ biopsies and polypectomy    . Nerve surgery      '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' during surgery for endometriosis  . Breast lumpectomy with needle localization and axillary sentinel lymph node bx Right 04/27/2015    Procedure: RIGHT BREAST LUMPECTOMY WITH  TWO NEEDLE LOCALIZATION AND TWO RIGHT AXILLARY SENTINEL LYMPH NODE BX;  Surgeon: Fanny Skates, MD;  Location: [place];   Service: General;  Laterality: Right;    There were no vitals filed for this visit.  Visit Diagnosis:  Neck muscle spasm  Neck stiffness  Shoulder stiffness, right  Weakness of right upper extremity      Subjective Assessment - 09/13/15 1234    Subjective Thought stretches last visit were helpful as well as deep muscle massage.  I felt good for the next 24 hours.  I want to try to needling again though.  Now needs ibuprofen less.     Currently in Pain? Yes   Pain Score 2    Pain Location Neck   Pain Orientation Right   Pain Type Chronic pain   Pain Frequency Constant                         [facility] Adult PT Treatment/Exercise - 09/13/15 0001    Neck Exercises: Seated   Other Seated Exercise right upper trap stretch 3x 20 sec   Other Seated Exercise cervical rotation to the Lt 3x20 seconds   Neck Exercises: Supine   Neck Retraction 10 reps   Capital Flexion 10 reps   Moist Heat Therapy   Number Minutes Moist Heat 15 Minutes   Moist Heat  Location Cervical   Electrical Stimulation   Electrical Stimulation Location cervical right shoulder   Electrical Stimulation Action IFC   Electrical Stimulation Parameters to tolerance   Electrical Stimulation Goals Pain   Manual Therapy   Soft tissue mobilization right upper trap, levator scap, suboccipitals: elongation and trigger point release   Manual Traction 3x 25 sec   Muscle Energy Technique right upper trap 6x 5 sec holds          Trigger Point Dry Needling - 09/13/15 1314    Consent Given? Yes   Muscles Treated Upper Body Upper trapezius;Suboccipitals muscle group  right cervical multifidi   Upper Trapezius Response Twitch reponse elicited;Palpable increased muscle length   SubOccipitals Response Palpable increased muscle length      Right only;  Extra small gauge used.          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[employer])   Time 4   Period Weeks   Status On-going   PT [place] #3   Title Cervical sidebending improved to 30 degrees needed or dressing   Time 4   Period Weeks   Status On-going   PT SHORT TERM GOAL #4   Title Pain improved by 25% with home and work activities   Time 4   Period Weeks   Status On-going           PT Long Term Goals - 09/13/15 1320    PT LONG TERM GOAL #1   Title The patient will be independent in safe, self progression of HEP for further improvements in ROM, strength and pain reduction  10/27/15   Time 8   Period Weeks   Status On-going   PT LONG TERM GOAL #2   Title The patient will have improved right shoulder flexion/elevation to 160 degrees needed for overhead reaching into cabinets and the white board at work   Time 8   Period Weeks   Status On-going   PT [place] #3   Title Patient will have improved right glenohumeral, scapular and cervical strength to grossly 4/5 needed for lifting medium weight objects   Time 8   Period Weeks   Status On-going   PT LONG TERM GOAL #4   Title Patient will report a > 50% improvement in sleep   Time 8   Period Weeks   Status On-going   PT LONG TERM GOAL #5   Title Patient will report average pain with home and work ADLS [id number]    Time 8   Period Weeks   Status On-going   PT LONG TERM GOAL #6   Title FOTO functional outcome score improved from 55% limitation to 44% indicating improved function with less pain   Time 8   Period Weeks   Status On-going               Plan - 09/13/15 1315    Clinical Impression Statement The patient reports good relief from manual therapy but requests another trial of dry needling in hopes for further relief.  She reports no recent headaches and decreased medicine usage.  Much improved soft tissue lengths  noted in upper traps and suboccipitals.  Decreased trigger point size and number.  Good pain relief with modalities.  Therapist closely monitoring response throughout treatment session.     PT Next Visit Plan  assess response to 2nd dry needling ;continue manual therapy; postural strengthening, heat/stim as needed; recheck cervical and shoulder AROM; cane ex's;  possibly add yellow band scapular supine ex        Problem List Patient Active Problem List   Diagnosis Date Noted  . Breast cancer of upper-outer quadrant of right female breast ([employer]) 04/11/2015    Alvera Singh 09/13/2015, 1:21 PM  [facility] [address] [place], [place], [address] Phone: [phone]   Fax:  [phone]  Name: Breona Kolb MRN: [id number] Date of Birth: 1946/01/11   Ruben Im, PT 09/13/2015 1:22 PM Phone: [phone] Fax: [phone]

## 2015-09-15 ENCOUNTER — Ambulatory Visit: Payer: Medicare Other | Attending: Family Medicine | Admitting: Physical Therapy

## 2015-09-15 DIAGNOSIS — M542 Cervicalgia: Secondary | ICD-10-CM | POA: Insufficient documentation

## 2015-09-15 DIAGNOSIS — M25611 Stiffness of right shoulder, not elsewhere classified: Secondary | ICD-10-CM | POA: Diagnosis not present

## 2015-09-15 DIAGNOSIS — M6248 Contracture of muscle, other site: Secondary | ICD-10-CM | POA: Insufficient documentation

## 2015-09-15 DIAGNOSIS — R29898 Other symptoms and signs involving the musculoskeletal system: Secondary | ICD-10-CM | POA: Diagnosis not present

## 2015-09-15 DIAGNOSIS — M436 Torticollis: Secondary | ICD-10-CM | POA: Insufficient documentation

## 2015-09-15 DIAGNOSIS — M62838 Other muscle spasm: Secondary | ICD-10-CM

## 2015-09-15 NOTE — Patient Instructions (Signed)
Over Head Pull: Narrow Grip       On back, knees bent, feet flat, band across thighs, elbows straight but relaxed. Pull hands apart (start). Keeping elbows straight, bring arms up and over head, hands toward floor. Keep pull steady on band. Hold momentarily. Return slowly, keeping pull steady, back to start. Repeat __10_ times. Band color ___yellow___   Side Pull: Double Arm   On back, knees bent, feet flat. Arms perpendicular to body, shoulder level, elbows straight but relaxed. Pull arms out to sides, elbows straight. Resistance band comes across collarbones, hands toward floor. Hold momentarily. Slowly return to starting position. Repeat _10__ times. Band color ___yellow__   Sash   On back, knees bent, feet flat, left hand on left hip, right hand above left. Pull right arm DIAGONALLY (hip to shoulder) across chest. Bring right arm along head toward floor. Hold momentarily. Slowly return to starting position. Repeat _10__ times. Do with left arm. Band color __yellow____   Shoulder Rotation: Double Arm   On back, knees bent, feet flat, elbows tucked at sides, bent 90, hands palms up. Pull hands apart and down toward floor, keeping elbows near sides. Hold momentarily. Slowly return to starting position. Repeat __10_ times. Band color __yellow____

## 2015-09-15 NOTE — Therapy (Signed)
[facility] [address] [place], [place], [address] Phone: [phone]   Fax:  [phone]  Physical Therapy Treatment  Patient Details  Name: Jessica Roberts MRN: [id number] Date of Birth: 10-12-1945 Referring Provider: Dr. Addison Lank   Encounter Date: 09/15/2015      PT End of Session - 09/15/15 1322    Visit Number 5   Number of Visits 10   Date for PT Re-Evaluation 10/27/15   Authorization Type BCBS Medicare G codes; KX at visit 15   PT Start Time 1230   PT Stop Time 1325   PT Time Calculation (min) 55 min   Activity Tolerance Patient tolerated treatment well      Past Medical History  Diagnosis Date  . Anxiety   . Hypertension   . Anxiety and depression   . Breast cancer of upper-outer quadrant of right female breast ([place]) 04/11/2015  . Dengue fever   . Malaria   . Typhoid   . Depression   . Complication of anesthesia     "had a very hard time waking up"  . PONV (postoperative nausea and vomiting)   . GERD (gastroesophageal reflux disease)   . Headache   . Head injury, closed, with concussion ([place]) 1986    in [place]  . Head injury 1995    concussion in [place], med evac to [place], ear damage with vertigo  . Head injury, acute, with loss of consciousness ([place]) 2000    [place] the [place]  . Fall     broken nose  . Broken nose   . Breast cancer ([place]) 04/07/15    right breast  . Allergy     Past Surgical History  Procedure Laterality Date  . Surgical procedure for endometriosis    . Tonsillectomy    . Colonoscopy w/ biopsies and polypectomy    . Nerve surgery      '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' during surgery for endometriosis  . Breast lumpectomy with needle localization and axillary sentinel lymph node bx Right 04/27/2015    Procedure: RIGHT BREAST LUMPECTOMY WITH  TWO NEEDLE LOCALIZATION AND TWO RIGHT AXILLARY SENTINEL LYMPH NODE BX;  Surgeon: Fanny Skates, MD;  Location: [place];   Service: General;  Laterality: Right;    There were no vitals filed for this visit.  Visit Diagnosis:  Neck muscle spasm  Neck stiffness  Shoulder stiffness, right  Weakness of right upper extremity      Subjective Assessment - 09/15/15 1237    Subjective Patient called the day prior to PT to say she is much better.  Still some pain "but less fearful of pain."  Better ROM with turning my head.  I can reach for things on a shelf and get them down.   States she has tennis elbow on left.  No vertigo since first visit.     Currently in Pain? Yes   Pain Score 3    Pain Location Neck   Pain Orientation Right   Pain Onset More than a month ago   Pain Frequency Constant            [facility] PT Assessment - 09/15/15 0001    AROM   Right Shoulder Flexion 150 Degrees   Right Shoulder ABduction 146 Degrees   Cervical Flexion 70   Cervical Extension 55   Cervical - Right Side Bend 45   Cervical - Left Side Bend 43   Cervical - Right Rotation 60  Cervical - Left Rotation 55                     [facility] Adult PT Treatment/Exercise - 09/15/15 0001    Neck Exercises: Supine   Neck Retraction 10 reps   Capital Flexion 10 reps   Other Supine Exercise yellow band scapular series 5 reps each:  narrow grip overhead, wide grip overhead, HABD, ER, sash   Moist Heat Therapy   Number Minutes Moist Heat 15 Minutes   Moist Heat Location Cervical   Electrical Stimulation   Electrical Stimulation Location cervical   Electrical Stimulation Action IFC   Electrical Stimulation Parameters to tolerance    Electrical Stimulation Goals Pain   Manual Therapy   Soft tissue mobilization right upper trap, levator scap, suboccipitals: elongation and trigger point release   Manual Traction 3x 25 sec   Muscle Energy Technique right upper trap 6x 5 sec holds          Trigger Point Dry Needling - 09/15/15 1317    Consent Given? 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[employer])   [place] #3   Title Cervical sidebending improved to 30 degrees needed or dressing   Status Achieved   PT SHORT TERM GOAL #4   Title Pain improved by 25% with home and work activities   Time 4   Period Weeks   Status On-going           PT Long Term Goals - 09/15/15 1410    PT LONG TERM GOAL #1   Title The patient will be independent in safe, self progression of HEP for further improvements in ROM, strength and pain reduction  10/27/15   Time 8   Period Weeks   Status On-going   PT LONG TERM GOAL #2   Title The patient will have improved right shoulder flexion/elevation to 160 degrees needed for overhead reaching into cabinets and the white board at work   Time 8   Period Weeks   Status On-going   PT [place] #3   Title Patient will have improved right glenohumeral, scapular and cervical strength to grossly 4/5 needed for lifting medium weight objects   Time 8   Period Weeks   Status On-going   PT LONG TERM GOAL #4   Title Patient will report a > 50% improvement in sleep   Time 8   Period Weeks   Status On-going   PT LONG TERM GOAL #5   Title Patient will report average pain with home and work ADLS [id number]    Time 8   Period Weeks   Status On-going   PT LONG TERM GOAL #6   Title FOTO functional outcome score improved from 55% limitation to 44% indicating improved  function with less pain  Time 8   Period Weeks   Status On-going               Plan - 09/15/15 1322    Clinical Impression Statement Excellent improvement in cervical and shoulder AROM.  Decreased overall pain and trigger point size and number.  Patient is pleased with overall progress as well.  Recommend continued PT to address remaining myofascial concern, continued ROM and progression into low level strengthening.  Progressing with STGs.  Therapist closely monitoring response throughout session.    PT Next Visit Plan  assess response to 3rd dry needling ;continue manual therapy; postural strengthening, heat/stim as needed;  cane ex's;  add yellow band scapular supine ex to HEP        Problem List Patient Active Problem List   Diagnosis Date Noted  . Breast cancer of upper-outer quadrant of right female breast ([place]) 04/11/2015    Alvera Singh 09/15/2015, 2:12 PM  [facility] [address] [place], [place], [address] Phone: [phone]   Fax:  [phone]  Name: Jessica Roberts MRN: [id number] Date of Birth: [birth year]   Ruben Im, PT 09/15/2015 2:12 PM Phone: [phone] Fax: [phone]

## 2015-09-20 ENCOUNTER — Ambulatory Visit: Payer: Medicare Other | Admitting: Physical Therapy

## 2015-09-20 DIAGNOSIS — M62838 Other muscle spasm: Secondary | ICD-10-CM

## 2015-09-20 DIAGNOSIS — M436 Torticollis: Secondary | ICD-10-CM

## 2015-09-20 DIAGNOSIS — M25611 Stiffness of right shoulder, not elsewhere classified: Secondary | ICD-10-CM

## 2015-09-20 DIAGNOSIS — R29898 Other symptoms and signs involving the musculoskeletal system: Secondary | ICD-10-CM

## 2015-09-20 DIAGNOSIS — M6248 Contracture of muscle, other site: Secondary | ICD-10-CM | POA: Diagnosis not present

## 2015-09-20 NOTE — Patient Instructions (Signed)
Over Head Pull: Narrow Grip       On back, knees bent, feet flat, band across thighs, elbows straight but relaxed. Pull hands apart (start). Keeping elbows straight, bring arms up and over head, hands toward floor. Keep pull steady on band. Hold momentarily. Return slowly, keeping pull steady, back to start. Repeat _8__ times. Band color __yellow____   Side Pull: Double Arm   On back, knees bent, feet flat. Arms perpendicular to body, shoulder level, elbows straight but relaxed. Pull arms out to sides, elbows straight. Resistance band comes across collarbones, hands toward floor. Hold momentarily. Slowly return to starting position. Repeat _8__ times. Band color __yellow___   Sash   On back, knees bent, feet flat, left hand on left hip, right hand above left. Pull right arm DIAGONALLY (hip to shoulder) across chest. Bring right arm along head toward floor. Hold momentarily. Slowly return to starting position. Repeat _8__ times. Do with left arm. Band color __yellow____   Shoulder Rotation: Double Arm   On back, knees bent, feet flat, elbows tucked at sides, bent 90, hands palms up. Pull hands apart and down toward floor, keeping elbows near sides. Hold momentarily. Slowly return to starting position. Repeat __8_ times. Band color __yellow  [facility] [address] [place], [place] [address] Phone # [phone] Fax [phone]____

## 2015-09-20 NOTE — Therapy (Signed)
[facility] [address], [place], [place], [address] Phone: [phone]   Fax:  [phone]  Physical Therapy Treatment  Patient Details  Name: Jessica Roberts MRN: [id number] Date of Birth: 1946/01/14 Referring Provider: Dr. Addison Lank   Encounter Date: 09/20/2015      PT End of Session - 09/20/15 1310    Visit Number 6   Number of Visits 10   Date for PT Re-Evaluation 10/27/15   Authorization Type BCBS Medicare G codes; KX at visit 15   PT Start Time 1230   PT Stop Time 1323   PT Time Calculation (min) 53 min   Activity Tolerance Patient tolerated treatment well      Past Medical History  Diagnosis Date  . Anxiety   . Hypertension   . Anxiety and depression   . Breast cancer of upper-outer quadrant of right female breast ([place]) 04/11/2015  . Dengue fever   . Malaria   . Typhoid   . Depression   . Complication of anesthesia     "had a very hard time waking up"  . PONV (postoperative nausea and vomiting)   . GERD (gastroesophageal reflux disease)   . Headache   . Head injury, closed, with concussion ([place]) 1986    in [place]  . Head injury 1995    concussion in [place], med evac to [place], ear damage with vertigo  . Head injury, acute, with loss of consciousness ([place]) 2000    [place]  . Fall     broken nose  . Broken nose   . Breast cancer ([place]) 04/07/15    right breast  . Allergy     Past Surgical History  Procedure Laterality Date  . Surgical procedure for endometriosis    . Tonsillectomy    . Colonoscopy w/ biopsies and polypectomy    . Nerve surgery      '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' during surgery for endometriosis  . Breast lumpectomy with needle localization and axillary sentinel lymph node bx Right 04/27/2015    Procedure: RIGHT BREAST LUMPECTOMY WITH  TWO NEEDLE LOCALIZATION AND TWO RIGHT AXILLARY SENTINEL LYMPH NODE BX;  Surgeon: Fanny Skates, MD;  Location: [place];   Service: General;  Laterality: Right;    There were no vitals filed for this visit.  Visit Diagnosis:  Neck muscle spasm  Neck stiffness  Shoulder stiffness, right  Weakness of right upper extremity      Subjective Assessment - 09/20/15 1232    Subjective Less ibuprofen use.  Mostly C7 region pain.     Currently in Pain? Yes   Pain Score 2    Pain Location Neck   Pain Orientation Mid   Pain Frequency Constant                         [facility] Adult PT Treatment/Exercise - 09/20/15 0001    Neck Exercises: Supine   Neck Retraction 10 reps   Capital Flexion 10 reps   Other Supine Exercise yellow band scapular series 8 reps each:  narrow grip overhead, wide grip overhead, HABD, ER, sash   Moist Heat Therapy   Number Minutes Moist Heat 15 Minutes   Moist Heat Location Cervical   Electrical Stimulation   Electrical Stimulation Location cervical   Electrical Stimulation Action IFC   Electrical Stimulation Parameters 6 ma   Electrical Stimulation Goals Pain   Manual Therapy   Soft tissue mobilization  right upper trap, levator scap, suboccipitals: elongation and trigger point release   Manual Traction 3x 25 sec   Muscle Energy Technique right upper trap 6x 5 sec holds          Trigger Point Dry Needling - 09/20/15 1244    Consent Given? Yes   Muscles Treated Upper Body Upper trapezius;Levator scapulae   Upper Trapezius Response Twitch reponse elicited;Palpable increased muscle length   Levator Scapulae Response Twitch response elicited;Palpable increased muscle length              PT Education - 09/20/15 1244    Education provided (p) Yes   Education Details (p) yellow band exercises supine scapular   Person(s) Educated (p) Patient   Methods (p) Explanation;Demonstration;Handout   Comprehension (p) Verbalized understanding;Returned demonstration          PT Short Term Goals - 09/20/15 1315    PT SHORT TERM GOAL #1   Title The patient will  be able to participate in an initial HEP for ROM and early strengthening  (09/29/15)   Status Achieved   PT SHORT TERM GOAL #2   Title The patient will have improved shoulder flexion/elevation to 150 degrees 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[employer])   [place] #3   Title Cervical sidebending improved to 30 degrees needed or dressing   Status Achieved   PT SHORT TERM GOAL #4   Title Pain improved by 25% with home and work activities   Status Achieved           PT Long Term Goals - 09/20/15 1316    PT LONG TERM GOAL #1   Title The patient will be independent in safe, self progression of HEP for further improvements in ROM, strength and pain reduction  10/27/15   Time 8   Period Weeks   Status On-going   PT LONG TERM GOAL #2   Title The patient will have improved right shoulder flexion/elevation to 160 degrees needed for overhead reaching into cabinets and the white board at work   Time 8   Period Weeks   Status On-going   PT [place] #3   Title Patient will have improved right glenohumeral, scapular and cervical strength to grossly 4/5 needed for lifting medium weight objects   Time 8   Period Weeks   Status On-going   PT LONG TERM GOAL #4   Title Patient will report a > 50% improvement in sleep   Time 8   Period Weeks   Status On-going   PT LONG TERM GOAL #5   Title Patient will report average pain with home and work ADLS [id number]    Time 8   Period Weeks   Status On-going   PT LONG TERM GOAL #6   Title FOTO functional outcome score improved from 55% limitation to 44% indicating improved function with less pain   Time 8   Period Weeks   Status On-going               Plan - 09/20/15 1311    Clinical Impression Statement The patient is progressing well with cervical and shoulder AROM.  Decreased trigger point size and number but still present in bilateral upper traps.  Improved soft tissue length noted  especially in subocciptals.  Patient notes decreased headache.  Recommend continued treatment with focus on postural strengthening and independence with HEP.  May need dry needling and  manual therapy 2 more visits.     PT Next Visit Plan assess response to 4th DN;  continue manual therapy;  add seated thoracic extension, standing yellow band rows and extensions;  shoulder pulleys; IFC/heat for pain control        Problem List Patient Active Problem List   Diagnosis Date Noted  . Breast cancer of upper-outer quadrant of right female breast ([place]) 04/11/2015    Alvera Singh 09/20/2015, 1:17 PM  [facility] [address] [place], [place], [address] Phone: [phone]   Fax:  [phone]  Name: Jessica Roberts MRN: [id number] Date of Birth: [birth year]  Ruben Im, PT 09/20/2015 1:17 PM Phone: [phone] Fax: [phone]

## 2015-09-22 ENCOUNTER — Ambulatory Visit: Payer: Medicare Other | Admitting: Physical Therapy

## 2015-09-22 DIAGNOSIS — R29898 Other symptoms and signs involving the musculoskeletal system: Secondary | ICD-10-CM

## 2015-09-22 DIAGNOSIS — M25611 Stiffness of right shoulder, not elsewhere classified: Secondary | ICD-10-CM

## 2015-09-22 DIAGNOSIS — M436 Torticollis: Secondary | ICD-10-CM

## 2015-09-22 DIAGNOSIS — M62838 Other muscle spasm: Secondary | ICD-10-CM

## 2015-09-22 DIAGNOSIS — M6248 Contracture of muscle, other site: Secondary | ICD-10-CM | POA: Diagnosis not present

## 2015-09-22 NOTE — Patient Instructions (Signed)
   For sleeping:  1-2 pillows with a small rolled towel behind the neck  [facility] [address], [place], [place] [address] Phone # [phone] Fax [phone]

## 2015-09-22 NOTE — Therapy (Signed)
[facility] [address] [place], [place], [address] Phone: [phone]   Fax:  [phone]  Physical Therapy Treatment  Patient Details  Name: Jessica Roberts MRN: [id number] Date of Birth: Apr 07, 1946 Referring Provider: Dr. Addison Lank   Encounter Date: 09/22/2015      PT End of Session - 09/22/15 1324    Visit Number 7   Number of Visits 10   Date for PT Re-Evaluation 10/27/15   Authorization Type BCBS Medicare G codes; KX at visit 15   PT Start Time 1235   PT Stop Time 1335   PT Time Calculation (min) 60 min   Activity Tolerance Patient tolerated treatment well      Past Medical History  Diagnosis Date  . Anxiety   . Hypertension   . Anxiety and depression   . Breast cancer of upper-outer quadrant of right female breast ([place]) 04/11/2015  . Dengue fever   . Malaria   . Typhoid   . Depression   . Complication of anesthesia     "had a very hard time waking up"  . PONV (postoperative nausea and vomiting)   . GERD (gastroesophageal reflux disease)   . Headache   . Head injury, closed, with concussion ([place]) 1986    in [place]  . Head injury 1995    concussion in [place], med evac to [place], ear damage with vertigo  . Head injury, acute, with loss of consciousness ([place]) 2000    [place] of the country  . Fall     broken nose  . Broken nose   . Breast cancer ([place]) 04/07/15    right breast  . Allergy     Past Surgical History  Procedure Laterality Date  . Surgical procedure for endometriosis    . Tonsillectomy    . Colonoscopy w/ biopsies and polypectomy    . Nerve surgery      '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' during surgery for endometriosis  . Breast lumpectomy with needle localization and axillary sentinel lymph node bx Right 04/27/2015    Procedure: RIGHT BREAST LUMPECTOMY WITH  TWO NEEDLE LOCALIZATION AND TWO RIGHT AXILLARY SENTINEL LYMPH NODE BX;  Surgeon: Fanny Skates, MD;  Location: [facility];   Service: General;  Laterality: Right;    There were no vitals filed for this visit.  Visit Diagnosis:  Neck muscle spasm  Neck stiffness  Shoulder stiffness, right  Weakness of right upper extremity      Subjective Assessment - 09/22/15 1235    Subjective I want you to show me about the best pillow/sleeping position.  Waking up stiff 4/10 initially, now "1.5".     Currently in Pain? Yes   Pain Score 2    Pain Location Neck   Pain Type Chronic pain   Pain Onset More than a month ago   Pain Frequency Constant   Aggravating Factors  AM                          [facility] Adult PT Treatment/Exercise - 09/22/15 0001    Neck Exercises: Seated   Other Seated Exercise thoracic extension over small ball 10x   Shoulder Exercises: Standing   Extension Strengthening;Both;12 reps;Theraband   Extension Weight (lbs) yellow band   Row Strengthening;Both;15 reps;Theraband   Row Weight (lbs) yellow band   Shoulder Exercises: Pulleys   Flexion 3 minutes   Shoulder Exercises: Therapy Ball   Flexion 10 reps  Flexion Limitations red ball   Moist Heat Therapy   Number Minutes Moist Heat 15 Minutes   Moist Heat Location Cervical   Electrical Stimulation   Electrical Stimulation Location cervical   Electrical Stimulation Action IFC   Electrical Stimulation Parameters 7 ma   Electrical Stimulation Goals Pain   Manual Therapy   Manual Therapy Joint mobilization   Joint Mobilization C4-C7 PA grade 2 mobs 10x each level   Soft tissue mobilization right upper trap, levator scap, suboccipitals: elongation and trigger point release   Manual Traction 3x 25 sec   Muscle Energy Technique right upper trap 6x 5 sec holds                PT Education - 09/22/15 1324    Education provided Yes   Education Details standing yellow band rows, extensions, sleeping position, seated thoracic ext   Person(s) Educated Patient   Methods Explanation;Demonstration;Handout    Comprehension Verbalized understanding;Returned demonstration          PT Short Term Goals - 09/22/15 1327    PT SHORT TERM GOAL #1   Title The patient will be able to participate in an initial HEP for ROM and early strengthening  (09/29/15)   Status Achieved   PT SHORT TERM GOAL #2   Title The patient will have improved shoulder flexion/elevation to 150 degrees 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[employer])   Status Achieved   PT [place] #3   Title Cervical sidebending improved to 30 degrees needed or dressing   Status Achieved   PT SHORT TERM GOAL #4   Title Pain improved by 25% with home and work activities   Status Achieved           PT Long Term Goals - 09/22/15 1327    PT LONG TERM GOAL #1   Title The patient will be independent in safe, self progression of HEP for further improvements in ROM, strength and pain reduction  10/27/15   Time 8   Period Weeks   Status On-going   PT LONG TERM GOAL #2   Title The patient will have improved right shoulder flexion/elevation to 160 degrees needed for overhead reaching into cabinets and the white board at work   Time 8   Period Weeks   Status On-going   PT LONG TERM GOAL #3   Title Patient will have improved right glenohumeral, scapular and cervical strength to grossly 4/5 needed for lifting medium weight objects   Time 8   Period Weeks   Status On-going   PT LONG TERM GOAL #4   Title Patient will report a > 50% improvement in sleep   Time 8   Period Weeks   Status On-going   PT LONG TERM GOAL #5   Title Patient will report average pain with home and work ADLS [id number]    Time 8   Period Weeks   Status On-going   PT LONG TERM GOAL #6   Title FOTO functional outcome score improved from 55% limitation to 44% indicating improved function with less pain   Time 8   Period Weeks   Status On-going               Plan - 09/22/15 1325    Clinical Impression Statement The patient is progressing  with pain intensity as well as AROM.  Improving soft tissue length but with continued tender points in upper traps.  No exacerbation of pain  with postural strengthening.  Verbal and tactile cues to decrease compensatory shoulder hike.     PT Next Visit Plan see how towel roll did for sleeping;  continue shoulder pulleys and ball on wall;  continue postural strengthening possibly add prone;  IFC/heat as needed;  DN as needed        Problem List Patient Active Problem List   Diagnosis Date Noted  . Breast cancer of upper-outer quadrant of right female breast ([place]) 04/11/2015    Jessica Roberts 09/22/2015, 1:29 PM  [facility] [address], [place], [place], [address] Phone: [phone]   Fax:  [phone]  Name: Jessica Roberts MRN: [id number] Date of Birth: [birth year]    Ruben Im, PT 09/22/2015 1:29 PM Phone: [phone] Fax: [phone]

## 2015-09-27 ENCOUNTER — Ambulatory Visit: Payer: Medicare Other | Admitting: Physical Therapy

## 2015-09-27 DIAGNOSIS — R29898 Other symptoms and signs involving the musculoskeletal system: Secondary | ICD-10-CM

## 2015-09-27 DIAGNOSIS — M25611 Stiffness of right shoulder, not elsewhere classified: Secondary | ICD-10-CM

## 2015-09-27 DIAGNOSIS — M62838 Other muscle spasm: Secondary | ICD-10-CM

## 2015-09-27 DIAGNOSIS — M436 Torticollis: Secondary | ICD-10-CM

## 2015-09-27 DIAGNOSIS — M6248 Contracture of muscle, other site: Secondary | ICD-10-CM | POA: Diagnosis not present

## 2015-09-27 NOTE — Therapy (Signed)
[facility] [address] [place], [place], [address] Phone: [phone]   Fax:  [phone]  Physical Therapy Treatment  Patient Details  Name: Jessica Roberts MRN: [id number] Date of Birth: October 15, 1945 Referring Provider: Dr. Addison Lank   Encounter Date: 09/27/2015      PT End of Session - 09/27/15 1318    Visit Number 8   Number of Visits 10   Date for PT Re-Evaluation 10/27/15   Authorization Type BCBS Medicare G codes; KX at visit 15   PT Start Time 1230   PT Stop Time 1325   PT Time Calculation (min) 55 min   Activity Tolerance Patient tolerated treatment well      Past Medical History  Diagnosis Date  . Anxiety   . Hypertension   . Anxiety and depression   . Breast cancer of upper-outer quadrant of right female breast ([place]) 04/11/2015  . Dengue fever   . Malaria   . Typhoid   . Depression   . Complication of anesthesia     "had a very hard time waking up"  . PONV (postoperative nausea and vomiting)   . GERD (gastroesophageal reflux disease)   . Headache   . Head injury, closed, with concussion ([place]) 1986    in [place]  . Head injury 1995    concussion in [place], med evac to [place], ear damage with vertigo  . Head injury, acute, with loss of consciousness ([place]) 2000    [place] the country  . Fall     broken nose  . Broken nose   . Breast cancer ([place]) 04/07/15    right breast  . Allergy     Past Surgical History  Procedure Laterality Date  . Surgical procedure for endometriosis    . Tonsillectomy    . Colonoscopy w/ biopsies and polypectomy    . Nerve surgery      '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' during surgery for endometriosis  . Breast lumpectomy with needle localization and axillary sentinel lymph node bx Right 04/27/2015    Procedure: RIGHT BREAST LUMPECTOMY WITH  TWO NEEDLE LOCALIZATION AND TWO RIGHT AXILLARY SENTINEL LYMPH NODE BX;  Surgeon: Fanny Skates, MD;  Location: [facility];   Service: General;  Laterality: Right;    There were no vitals filed for this visit.  Visit Diagnosis:  Neck muscle spasm  Neck stiffness  Shoulder stiffness, right  Weakness of right upper extremity      Subjective Assessment - 09/27/15 1234    Subjective Woke up Sunday feeling bad.  Did go to [facility] on Saturday for a March.   I don't feel well today.  Currently 5/10.  Right upper trap.  I didn't sleep well one night.  I didn't do my exercises because I felt bad.  No excessive soreness or pain after last visit.     Currently in Pain? Yes   Pain Score 5    Pain Location Neck   Pain Type Chronic pain                         [facility] Adult PT Treatment/Exercise - 09/27/15 0001    Neck Exercises: Seated   Lateral Flexion Right;5 reps   Other Seated Exercise thoracic extension over small ball 10x   Shoulder Exercises: Pulleys   Flexion 3 minutes   Moist Heat Therapy   Number Minutes Moist Heat 15 Minutes   Moist Heat Location Cervical  Land IFC   Electrical Stimulation Parameters 7 ma   Electrical Stimulation Goals Pain   Manual Therapy   Soft tissue mobilization right upper trap, levator scap, suboccipitals: elongation and trigger point release   Manual Traction 3x 25 sec   Muscle Energy Technique right upper trap 6x 5 sec holds          Trigger Point Dry Needling - 09/27/15 1317    Consent Given? 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[employer])   [place] #3   Title Cervical sidebending improved to 30 degrees needed or dressing   Status Achieved   PT SHORT TERM GOAL #4   Title Pain improved by 25% with home and work activities   Status Achieved           PT Long Term Goals - 09/27/15 1321    PT LONG TERM GOAL #1   Title The patient will be independent in safe, self progression of HEP for further improvements in ROM, strength and pain reduction  10/27/15   Time 8   Period Weeks   Status On-going   PT LONG TERM GOAL #2   Title The patient will have improved right shoulder flexion/elevation to 160 degrees needed for overhead reaching into cabinets and the white board at work   Time 8   Period Weeks   Status On-going   PT [place] #3   Title Patient will have improved right glenohumeral, scapular and cervical strength to grossly 4/5 needed for lifting medium weight objects   Time 8   Period Weeks   Status On-going   PT LONG TERM GOAL #4   Title Patient will report a > 50% improvement in sleep   Time 8   Period Weeks   Status On-going   PT LONG TERM GOAL #5   Title Patient will report average pain with home and work ADLS [id number]    Time 8   Period Weeks   Status On-going   PT LONG TERM GOAL #6   Title FOTO functional outcome score improved from 55% limitation to 44% indicating improved function with less pain   Time 8   Period Weeks   Status On-going               Plan - 09/27/15 1319    Clinical Impression Statement The patient reports an exacerbation of right upper trap region pain since Sunday for no apparent reason.  Increased pain intensity, muscle spasm and decreased ROM noted.  Treatment focus on pain reduction and muscule relaxation, low level ther ex.  Therapist closely monitoring pain and modifying  accordingly.     PT Next Visit Plan shoulder pulleys and ball on wall;  continue postural  strengthening possibly add prone;  IFC/heat as needed;  DN as needed        Problem List Patient Active Problem List   Diagnosis Date Noted  . Breast cancer of upper-outer quadrant of right female breast ([place]) 04/11/2015    Alvera Singh 09/27/2015, 1:23 PM  [facility] [address] [place], [place], [address] Phone: [phone]   Fax:  [phone]  Name: Jessica Roberts MRN: [id number] Date of Birth: 04/20/1946   Ruben Im, PT 09/27/2015 1:24 PM Phone: [phone] Fax: [phone]

## 2015-09-29 ENCOUNTER — Ambulatory Visit: Payer: Medicare Other

## 2015-09-29 ENCOUNTER — Ambulatory Visit (HOSPITAL_BASED_OUTPATIENT_CLINIC_OR_DEPARTMENT_OTHER): Payer: Medicare Other | Admitting: Nurse Practitioner

## 2015-09-29 ENCOUNTER — Encounter: Payer: Self-pay | Admitting: Nurse Practitioner

## 2015-09-29 VITALS — BP 137/55 | HR 86 | Temp 98.4°F | Resp 18 | Ht 66.0 in | Wt 181.0 lb

## 2015-09-29 DIAGNOSIS — Z170 Estrogen receptor positive status [ER+]: Secondary | ICD-10-CM | POA: Diagnosis not present

## 2015-09-29 DIAGNOSIS — C50411 Malignant neoplasm of upper-outer quadrant of right female breast: Secondary | ICD-10-CM

## 2015-09-29 NOTE — Progress Notes (Signed)
CLINIC:  Cancer Survivorship   REASON FOR VISIT:  Routine follow-up post-treatment for a recent history of breast cancer.  BRIEF ONCOLOGIC HISTORY:  Oncology History   Breast cancer of upper-outer quadrant of right female breast [facility])   Staging form: Breast, AJCC 7th Edition     Clinical stage from 04/07/2015: Stage IA (T1a, N0, M0) - Signed by Truitt Merle, MD on 05/20/2015     Pathologic stage from 04/27/2015: Stage IA (T1b, N0, cM0) - Signed by Truitt Merle, MD on 05/20/2015       Breast cancer of upper-outer quadrant of right female breast ([place])   04/01/2015 Mammogram Diagnostic mammogram and US showed a 0.7x 0.4 x 0.4 cm mass in the right breast 11:00 position, and a second 38m lesion at upper-midline, and a benign cluster of cysts measuring 1cm.    04/07/2015 Initial Biopsy Right breast UOQ mass core needle biopsy showed invasive ductal carcinoma and DCIS, grade 1. ER+ (100%), PR+ (80%), HER2/neu negative (ratio 1.25), Ki67 5%.   04/07/2015 Clinical Stage Stage IA: T1a N0   04/27/2015 Definitive Surgery Right breast double lumpectomy/SLNB: IDC, 0.9 cm, DCIS, invasive dz 0.1 cm from posterior margin, rem. margins >0.5 cm; second lump: fibrocystic change. 1 LN negative for malignancy   04/27/2015 Pathologic Stage Stage IA: T1b N0   06/21/2015 - 07/13/2015 Radiation Therapy Adjuvant RT: Right breast 42.72 Gy over 21 fractions.     Anti-estrogen oral therapy To discuss with Dr. FBurr Medicoat upcoming appointment (TBD)    INTERVAL HISTORY:  [facility]. [facility] initial meeting to review her survivorship care plan detailing her treatment course for breast cancer, as well as monitoring long-term side effects of that treatment, education regarding health maintenance, screening, and overall wellness and health promotion.     Overall, Ms. BPrudenreports doing pretty well since completing her radiation therapy approximately two and a half months ago.  She remains  fatigued and has had some episodes of sadness and teariness, but denies frank depression.  She has experienced a great deal of loss over the last two years with the deaths of her mother and close friends coupled with her own cancer diagnosis, and is ready to "get things back under control."  She has not yet begun her anti-estrogen therapy and is uncertain whether she wants to at this time due to potential side effects. [place]  She has not yet seen Dr. FBurr Medicoto discuss therapy but plans to do so soon.  She denies headache, cough, shortness of breath or bone pain.  She has a good appetite and denies any weight loss.    REVIEW OF SYSTEMS:  General: Fatigue as above. Denies fever, chills, unintentional weight loss, or night sweats. HEENT: Denies visual changes, hearing loss, mouth sores or difficulty swallowing. Cardiac: Denies palpitations, chest pain, and lower extremity edema.  Respiratory: Denies wheeze or dyspnea on exertion.  Breast: Denies any new nodularity, masses, tenderness, nipple changes, or nipple discharge.  GI: Denies abdominal pain, constipation, diarrhea, nausea, or vomiting.  GU: Denies dysuria, hematuria, vaginal bleeding, vaginal discharge, or vaginal dryness.  Musculoskeletal: Denies joint or bone pain.  Neuro: Denies recent fall or numbness / tingling in her extremities. Skin: Denies rash, pruritis, or open wounds.  Psych: Denies depression, anxiety, insomnia, or memory loss.   A 14-point review of systems was completed and was negative,  except as noted above.   ONCOLOGY TREATMENT TEAM:  1. Surgeon:  Dr. Dalbert Batman at [facility]  2. Medical Oncologist: Dr. Burr Medico 3. Radiation Oncologist: Dr. Pablo Ledger    PAST MEDICAL/SURGICAL HISTORY:  Past Medical History  Diagnosis Date  . Anxiety   . Hypertension   . Anxiety and depression   . Breast cancer of  upper-outer quadrant of right female breast ([place]) 04/11/2015  . Dengue fever   . Malaria   . Typhoid   . Depression   . Complication of anesthesia     "had a very hard time waking up"  . PONV (postoperative nausea and vomiting)   . GERD (gastroesophageal reflux disease)   . Headache   . Head injury, closed, with concussion ([place]) 1986    in [place]  . Head injury 1995    concussion in [place], med evac to [place], ear damage with vertigo  . Head injury, acute, with loss of consciousness ([place]) 2000    out of the country  . Fall     broken nose  . Broken nose   . Breast cancer ([place]) 04/07/15    right breast  . Allergy    Past Surgical History  Procedure Laterality Date  . Surgical procedure for endometriosis    . Tonsillectomy    . Colonoscopy w/ biopsies and polypectomy    . Nerve surgery      '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' during surgery for endometriosis  . Breast lumpectomy with needle localization and axillary sentinel lymph node bx Right 04/27/2015    Procedure: RIGHT BREAST LUMPECTOMY WITH  TWO NEEDLE LOCALIZATION AND TWO RIGHT AXILLARY SENTINEL LYMPH NODE BX;  Surgeon: Fanny Skates, MD;  Location: [place];  Service: General;  Laterality: Right;     ALLERGIES:  Allergies  Allergen Reactions  . Codeine Other (See Comments)    Nervous system dysfunction, cannot control or use limbs  . Percocet [Oxycodone-Acetaminophen] Nausea And Vomiting  . Percodan [Oxycodone-Aspirin] Nausea And Vomiting     CURRENT MEDICATIONS:  Current Outpatient Prescriptions on File Prior to Visit  Medication Sig Dispense Refill  . buPROPion (WELLBUTRIN XL) 300 MG 24 hr tablet Take 300 mg by mouth daily.   1  . Cyanocobalamin (VITAMIN B-12 SL) Place 1 tablet under the tongue daily.    . diazepam (VALIUM) 5 MG tablet Take 2.5 mg by mouth daily as needed for anxiety.     . Esomeprazole Magnesium (NEXIUM PO) Take 22.3 mg by mouth every other day.    . Misc Natural Products (TART CHERRY  ADVANCED) CAPS Take 3 capsules by mouth daily. Reported on 09/01/2015    . Multiple Vitamin (MULTIVITAMIN WITH MINERALS) TABS tablet Take 1 tablet by mouth at bedtime.    . sertraline (ZOLOFT) 100 MG tablet Take 100 mg by mouth daily.     . traZODone (DESYREL) 50 MG tablet TAKE 1 TO 1/2 TABLET BY MOUTH AT BEDTIME  1  . acetaminophen (TYLENOL) 500 MG tablet Take 500 mg by mouth 2 (two) times daily as needed for moderate pain (pain). Reported on 09/29/2015    . Vitamin D, Ergocalciferol, (DRISDOL) 50000 UNITS CAPS capsule Take 50,000 Units by mouth once a week. Reported on 09/29/2015  0   No current facility-administered medications on file prior to visit.     ONCOLOGIC FAMILY HISTORY:  Family History  Problem Relation Age of Onset  . Hypertension Mother   . Thyroid disease Mother   . Breast cancer Mother   .  Emphysema Father   . Diabetes Maternal Grandmother   . Heart disease Paternal Grandmother   . Alcohol abuse Paternal Grandfather      GENETIC COUNSELING/TESTING: No    SOCIAL HISTORY:  Jessica Roberts is single and lives with a friend in [place], [place].  She has no children. 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  She denies any current or history of tobacco or illicit drug use, and uses alcohol approximately 3 glasses wine / week.   PHYSICAL EXAMINATION:  Vital Signs: Filed Vitals:   09/29/15 1551  BP: 137/55  Pulse: 86  Temp: 98.4 F (36.9 C)  Resp: 18   ECOG Performance Status: 0  General: Well-nourished, well-appearing female in no acute distress.  She is unaccompanied in clinic today.   HEENT: Head is atraumatic and normocephalic.  Pupils equal and reactive to light and accomodation. Conjunctivae clear without exudate.  Sclerae anicteric. Oral mucosa is pink, moist, and intact without lesions.  Oropharynx is pink without lesions or erythema.  Lymph: No cervical, supraclavicular, infraclavicular, or axillary lymphadenopathy noted  on palpation.  Cardiovascular: Regular rate and rhythm without murmurs, rubs, or gallops. Respiratory: Clear to auscultation bilaterally. Chest expansion symmetric without accessory muscle use on inspiration or expiration.  GI: Abdomen soft and round. No tenderness to palpation. Bowel sounds normoactive in 4 quadrants.    Neuro: No focal deficits. Steady gait.  Psych: Mood and affect normal and appropriate for situation.  Extremities: No edema, cyanosis, or clubbing.  Skin: Warm and dry. No open lesions noted.   LABORATORY DATA:  None for this visit.  DIAGNOSTIC IMAGING:  None for this visit.     ASSESSMENT AND PLAN:   1. History of breast cancer: Stage IA invasive ductal carcinoma with ductal carcinoma in situ of the right breast (03/2015), grade 1, ER positive, PR positive, HER2/neu negative, S/P double lumpectomy (03/2015) with fibrocystic changes on the left breast, followed by adjuvant radiation therapy completed 06/2015, with plans to discuss adjuvant endocrine therapy with Dr. Burr Medico next week.  Ms. Marxen is doing well without clinical symptoms worrisome for disease recurrence. As above, she is not certain that she wants to proceed with endocrine therapy at this time.  I have reviewed the purpose and plan for endocrine therapy with Ms. Pascale and she has a good understanding.  She knows that there is no certainty when it comes to potential side effects and that if she begins therapy and is unable to tolerate it, that it can be interrupted or stopped.  She will continue to consider her options and I have made her an appointment to return to see Dr. Burr Medico in one week's time to discuss further.  A comprehensive survivorship care plan and treatment summary was reviewed with the patient today detailing her breast cancer diagnosis, treatment course, potential late/long-term effects of treatment, appropriate follow-up care with recommendations for the future, and patient education resources.  A copy  of this summary, along with a letter will be sent to the patient's primary care provider via in basket message after today's visit.  Ms. Noffsinger is welcome to return to the Survivorship Clinic in the future, as needed; no follow-up will be scheduled at this time.    2. Bone health:  Given Ms. Chicoine's age/history of breast cancer and the potential of her initiating treatment with an aromatase inhibitor, she is at risk for bone demineralization.  I have advised her that if she does initiate AI therapy that  we will need to check a baseline DEXA scan and monitor her for bone demineralization moving forward.  In the meantime, she was encouraged to increase her consumption of foods rich in calcium and vitamin D as well as to increase her weight-bearing activities.  She was given education on specific activities to promote bone health.  3. Cancer screening:  Due to Ms. Hockey's history and her age, she should receive screening for skin cancers, colon cancer, and gynecologic cancers.  The information and recommendations are listed on the patient's comprehensive care plan/treatment summary and were reviewed in detail with the patient.    4. Health maintenance and wellness promotion: We discussed recommendations to maximize nutrition and minimize recurrence, such as increased intake of fruits, vegetables, lean proteins, and minimizing the intake of red meats and processed foods.  She was also encouraged to engage in moderate to vigorous exercise for 30 minutes per day most days of the week. I have provided her with information regarding the [employer] fitness program, which is designed for cancer survivors to help them become more physically fit after cancer treatments.  She was instructed to limit her alcohol consumption and continue to abstain from tobacco use.  A copy of the "Take Control of Your Health" brochure was given to her reinforcing these recommendations.   5. Support services/counseling: It is not  uncommon for this period of the patient's cancer care trajectory to be one of many emotions and stressors.  We discussed an opportunity for her to participate in the next session of [facility] ("Finding Your New Normal") support group series designed for patients after they have completed treatment.  Ms. Slape was encouraged to take advantage of our many other support services programs, support groups, and/or counseling in coping with her new life as a cancer survivor after completing anti-cancer treatment.  She was offered support today through active listening and expressive supportive counseling.  She was given information regarding our available services and encouraged to contact me with any questions or for help enrolling in any of our support group/programs.    A total of 50 minutes of face-to-face time was spent with this patient with greater than 50% of that time in counseling and care-coordination.   Sylvan Cheese, NP  Survivorship Program [facility] [phone]   Note: PRIMARY CARE PROVIDER [facility], [place] [phone]

## 2015-10-04 ENCOUNTER — Ambulatory Visit: Payer: Medicare Other | Admitting: Physical Therapy

## 2015-10-04 DIAGNOSIS — M62838 Other muscle spasm: Secondary | ICD-10-CM

## 2015-10-04 DIAGNOSIS — M6248 Contracture of muscle, other site: Secondary | ICD-10-CM | POA: Diagnosis not present

## 2015-10-04 DIAGNOSIS — M542 Cervicalgia: Secondary | ICD-10-CM

## 2015-10-04 DIAGNOSIS — M25611 Stiffness of right shoulder, not elsewhere classified: Secondary | ICD-10-CM

## 2015-10-04 DIAGNOSIS — M436 Torticollis: Secondary | ICD-10-CM

## 2015-10-04 NOTE — Therapy (Signed)
[facility] [address], [place], [place], [address] Phone: [phone]   Fax:  [phone]  Physical Therapy Treatment  Patient Details  Name: Jessica Roberts MRN: [id number] Date of Birth: 1946/05/21 Referring Provider: Dr. Addison Lank   Encounter Date: 10/04/2015      PT End of Session - 10/04/15 1323    Visit Number 9   Number of Visits 10   Date for PT Re-Evaluation 10/27/15   Authorization Type BCBS Medicare G codes; KX at visit 15   PT Start Time 1225   PT Stop Time 1327   PT Time Calculation (min) 62 min   Activity Tolerance Patient tolerated treatment well      Past Medical History  Diagnosis Date  . Anxiety   . Hypertension   . Anxiety and depression   . Breast cancer of upper-outer quadrant of right female breast ([place]) 04/11/2015  . Dengue fever   . Malaria   . Typhoid   . Depression   . Complication of anesthesia     "had a very hard time waking up"  . PONV (postoperative nausea and vomiting)   . GERD (gastroesophageal reflux disease)   . Headache   . Head injury, closed, with concussion ([place]) 1986    in [place]  . Head injury 1995    concussion in [place], med evac to [place], ear damage with vertigo  . Head injury, acute, with loss of consciousness ([place]) 2000    [place] of the country  . Fall     broken nose  . Broken nose   . Breast cancer ([place]) 04/07/15    right breast  . Allergy     Past Surgical History  Procedure Laterality Date  . Surgical procedure for endometriosis    . Tonsillectomy    . Colonoscopy w/ biopsies and polypectomy    . Nerve surgery      '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' during surgery for endometriosis  . Breast lumpectomy with needle localization and axillary sentinel lymph node bx Right 04/27/2015    Procedure: RIGHT BREAST LUMPECTOMY WITH  TWO NEEDLE LOCALIZATION AND TWO RIGHT AXILLARY SENTINEL LYMPH NODE BX;  Surgeon: Fanny Skates, MD;  Location: [place];   Service: General;  Laterality: Right;    There were no vitals filed for this visit.  Visit Diagnosis:  Neck muscle spasm  Neck stiffness  Shoulder stiffness, right  Neck pain on right side      Subjective Assessment - 10/04/15 1233    Subjective I walk around with my shoulders shrugged up.  2015/2016 was a stressful year.  I feel better for 48 hours after treatment last Tuesday but then I woke up Sunday with right side of neck all flared up.  Took ibuprofen but didn't sleep at all last night.  I've got a towel roll I use with my "in-flight pillow" for sleeping.  I think I flared up b/c I had a made a major life decision.    No more vertigo.  Hasn't done ex's in the last few days.     Pertinent History Several close head injuries with last 2014.  Breast CA 2016;  hx of vertigo (had some in December)    Currently in Pain? Yes   Pain Score 1   took ibuprofen   Pain Location Neck   Pain Orientation Right   Aggravating Factors  turning to the right 2/10 pain   Pain Relieving Factors ibuprofen; dry needling;  lying  with correct pillow support                         [facility] Adult PT Treatment/Exercise - 10/04/15 0001    Moist Heat Therapy   Number Minutes Moist Heat 15 Minutes   Moist Heat Location Cervical   Electrical Stimulation   Electrical Stimulation Location cervical   Electrical Stimulation Action IFC   Electrical Stimulation Parameters 7 ma   Electrical Stimulation Goals Pain   Manual Therapy   Manual Therapy Taping   Joint Mobilization C4-C7 PA grade 2 mobs 10x each level   Soft tissue mobilization right upper trap, levator scap, suboccipitals: elongation and trigger point release   Manual Traction 3x 25 sec   Muscle Energy Technique right upper trap 6x 5 sec holds   McConnell right upper trap inhibition taping           Trigger Point Dry Needling - 10/04/15 1314    Consent Given? Yes   Muscles Treated Upper Body Upper trapezius;Levator scapulae   right cervical multifidi   Upper Trapezius Response Twitch reponse elicited;Palpable increased muscle length   Levator Scapulae Response Twitch response elicited;Palpable increased muscle length     Right side only.           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[employer])   [place] #3   Title Cervical sidebending improved to 30 degrees needed or dressing   Status Achieved   PT SHORT TERM GOAL #4   Title Pain improved by 25% with home and work activities   Status Achieved           PT Long Term Goals - 10/04/15 1320    PT LONG TERM GOAL #1   Title The patient will be independent in safe, self progression of HEP for further improvements in ROM, strength and pain reduction  10/27/15   Time 8   Period Weeks   Status On-going   PT LONG TERM GOAL #2   Title The patient will have improved right shoulder flexion/elevation to 160 degrees needed for overhead reaching into cabinets and the white board at work   Time 8   Period Weeks   Status On-going   PT [place] #3   Title Patient will have improved right glenohumeral, scapular and cervical strength to grossly 4/5 needed for lifting medium weight objects   Time 8   Period Weeks   Status On-going   PT LONG TERM GOAL #4   Title Patient will report a > 50% improvement in sleep   Time 8   Period Weeks   Status On-going   PT LONG TERM GOAL #5   Title Patient will report average pain with home and work ADLS [id number]    Time 8   Period Weeks   Status On-going   PT LONG TERM GOAL #6   Title FOTO functional outcome score improved from 55% limitation to 44% indicating improved function  with less pain   Time 8   Period Weeks   Status On-going  Plan - 10/04/15 1244    Clinical Impression Statement The patient reports continued right upper trap region pain which patient acknowledges may be the result of life stress.  She does states the  vertigo she has had since November  is gone as well no more headaches.   Recommend continued PT for postural strengthening (may be a slower progression secondary to recent breast CA surgery) and decreased dry needling.  Will try inhibition taping today of upper trap.  Therapist closely monitoring response and modifying as needed.     PT Next Visit Plan assess response to dry needling and taping;   recheck shoulder and neck ROM and strength;  G CODE DUE;   shoulder pulleys and ball on wall;  continue postural strengthening possibly add prone;  IFC/heat as needed;  soft tissue mobilization        Problem List Patient Active Problem List   Diagnosis Date Noted  . Breast cancer of upper-outer quadrant of right female breast ([place]) 04/11/2015    Alvera Singh 10/04/2015, 1:23 PM  [facility] [address] [place], [place], [address] Phone: [phone]   Fax:  [phone]  Name: Jessica Roberts MRN: [id number] Date of Birth: 07/10/1946   Ruben Im, PT 10/04/2015 1:24 PM Phone: [phone] Fax: [phone]

## 2015-10-05 NOTE — Therapy (Signed)
[facility] [address] [place], [place], [address] Phone: [phone]   Fax:  [phone]  Physical Therapy Treatment  Patient Details  Name: Jessica Roberts MRN: [id number] Date of Birth: 05/25/1946 Referring Provider: Dr. Addison Lank   Encounter Date: 10/04/2015      PT End of Session - 10/04/15 1323    Visit Number 9   Number of Visits 10   Date for PT Re-Evaluation 10/27/15   Authorization Type BCBS Medicare G codes; KX at visit 15   PT Start Time 1225   PT Stop Time 1327   PT Time Calculation (min) 62 min   Activity Tolerance Patient tolerated treatment well      Past Medical History  Diagnosis Date  . Anxiety   . Hypertension   . Anxiety and depression   . Breast cancer of upper-outer quadrant of right female breast ([place]) 04/11/2015  . Dengue fever   . Malaria   . Typhoid   . Depression   . Complication of anesthesia     "had a very hard time waking up"  . PONV (postoperative nausea and vomiting)   . GERD (gastroesophageal reflux disease)   . Headache   . Head injury, closed, with concussion ([place]) 1986    in [place]  . Head injury 1995    concussion in [place], med evac to [place], ear damage with vertigo  . Head injury, acute, with loss of consciousness ([place]) 2000    [place]  . Fall     broken nose  . Broken nose   . Breast cancer ([place]) 04/07/15    right breast  . Allergy     Past Surgical History  Procedure Laterality Date  . Surgical procedure for endometriosis    . Tonsillectomy    . Colonoscopy w/ biopsies and polypectomy    . Nerve surgery      '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' during surgery for endometriosis  . Breast lumpectomy with needle localization and axillary sentinel lymph node bx Right 04/27/2015    Procedure: RIGHT BREAST LUMPECTOMY WITH  TWO NEEDLE LOCALIZATION AND TWO RIGHT AXILLARY SENTINEL LYMPH NODE BX;  Surgeon: Fanny Skates, MD;  Location: [facility];   Service: General;  Laterality: Right;    There were no vitals filed for this visit.  Visit Diagnosis:  Neck muscle spasm  Neck stiffness  Shoulder stiffness, right  Neck pain on right side      Subjective Assessment - 10/04/15 1233    Subjective I walk around with my shoulders shrugged up.  2015/2016 was a stressful year.  I feel better for 48 hours after treatment last Tuesday but then I woke up Sunday with right side of neck all flared up.  Took ibuprofen but didn't sleep at all last night.  I've got a towel roll I use with my "in-flight pillow" for sleeping.  I think I flared up b/c I had a made a major life decision.    No more vertigo.  Hasn't done ex's in the last few days.     Pertinent History Several close head injuries with last 2014.  Breast CA 2016;  hx of vertigo (had some in December)    Currently in Pain? Yes   Pain Score 1   took ibuprofen   Pain Location Neck   Pain Orientation Right   Aggravating Factors  turning to the right 2/10 pain   Pain Relieving Factors ibuprofen; dry needling;  lying  with correct pillow support                           Trigger Point Dry Needling - 10/04/15 1314    Consent Given? 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[employer])   [place] #3   Title Cervical sidebending improved to 30 degrees needed  or dressing   Status Achieved   PT SHORT TERM GOAL #4   Title Pain improved by 25% with home and work activities   Status Achieved           PT Long Term Goals - 10/04/15 1320    PT LONG TERM GOAL #1   Title The patient will be independent in safe, self progression of HEP for further improvements in ROM, strength and pain reduction  10/27/15   Time 8   Period Weeks   Status On-going   PT LONG TERM GOAL #2   Title The patient will have improved right shoulder flexion/elevation to 160 degrees needed for overhead reaching into cabinets and the white board at work   Time 8   Period Weeks   Status On-going   PT [place] #3   Title Patient will have improved right glenohumeral, scapular and cervical strength to grossly 4/5 needed for lifting medium weight objects   Time 8   Period Weeks   Status On-going   PT LONG TERM GOAL #4   Title Patient will report a > 50% improvement in sleep   Time 8   Period Weeks   Status On-going   PT LONG TERM GOAL #5   Title Patient will report average pain with home and work ADLS [id number]    Time 8   Period Weeks   Status On-going   PT LONG TERM GOAL #6   Title FOTO functional outcome score improved from 55% limitation to 44% indicating improved function with less pain   Time 8   Period Weeks   Status On-going               Plan - 10/04/15 1244    Clinical Impression Statement The patient reports continued right upper trap region pain which patient acknowledges may be the result of life stress.  She does states the  vertigo she has had since November  is gone as well no more headaches.   Recommend continued PT for postural strengthening (may be a slower progression secondary to recent breast CA surgery) and decreased dry needling.  Will try inhibition taping today of upper trap.  Therapist closely monitoring response and modifying as needed.     PT Next Visit Plan assess response to dry needling and taping;   recheck shoulder and neck  ROM and strength;  G CODE DUE;  need more appt, may decrease frequency to 1x/week for 3 weeks;    shoulder pulleys and ball on wall;  continue postural strengthening possibly add prone;  IFC/heat as needed;  soft tissue mobilization        Problem List Patient Active Problem List   Diagnosis Date Noted  . Breast cancer of upper-outer quadrant of right female breast ([place]) 04/11/2015    Alvera Singh 10/05/2015, 9:06 AM  [facility] [address], [place], [place], [address] Phone: [phone]   Fax:  [phone]  Name: Jessica Roberts MRN: [id number] Date of Birth: 1945/08/17    Ruben Im, PT 10/05/2015 9:06 AM Phone: [phone] Fax: [phone]

## 2015-10-06 ENCOUNTER — Ambulatory Visit: Payer: Medicare Other

## 2015-10-06 ENCOUNTER — Telehealth: Payer: Self-pay | Admitting: Hematology

## 2015-10-06 ENCOUNTER — Encounter: Payer: Self-pay | Admitting: Hematology

## 2015-10-06 ENCOUNTER — Ambulatory Visit (HOSPITAL_BASED_OUTPATIENT_CLINIC_OR_DEPARTMENT_OTHER): Payer: Medicare Other | Admitting: Hematology

## 2015-10-06 VITALS — BP 138/94 | HR 81 | Temp 98.4°F | Resp 18 | Ht 66.0 in | Wt 179.1 lb

## 2015-10-06 DIAGNOSIS — Z170 Estrogen receptor positive status [ER+]: Secondary | ICD-10-CM | POA: Diagnosis not present

## 2015-10-06 DIAGNOSIS — C50411 Malignant neoplasm of upper-outer quadrant of right female breast: Secondary | ICD-10-CM

## 2015-10-06 DIAGNOSIS — M62838 Other muscle spasm: Secondary | ICD-10-CM

## 2015-10-06 DIAGNOSIS — M6248 Contracture of muscle, other site: Secondary | ICD-10-CM | POA: Diagnosis not present

## 2015-10-06 DIAGNOSIS — F329 Major depressive disorder, single episode, unspecified: Secondary | ICD-10-CM | POA: Diagnosis not present

## 2015-10-06 DIAGNOSIS — R29898 Other symptoms and signs involving the musculoskeletal system: Secondary | ICD-10-CM

## 2015-10-06 DIAGNOSIS — M542 Cervicalgia: Secondary | ICD-10-CM

## 2015-10-06 DIAGNOSIS — M436 Torticollis: Secondary | ICD-10-CM

## 2015-10-06 DIAGNOSIS — M25611 Stiffness of right shoulder, not elsewhere classified: Secondary | ICD-10-CM

## 2015-10-06 NOTE — Progress Notes (Signed)
[place]  [phone] [phone]  Clinic Follow Up Note   Patient Care Team: Cari Caraway, MD as PCP - General (Family Medicine) Fanny Skates, MD as Consulting Physician (General Surgery) Truitt Merle, MD as Consulting Physician (Hematology) Thea Silversmith, MD as Consulting Physician (Radiation Oncology) Mauro Kaufmann, RN as Registered Nurse Rockwell Germany, RN as Registered Nurse Sylvan Cheese, NP as Nurse Practitioner (Nurse Practitioner) 10/06/2015  CHIEF COMPLAINTS:  Follow up right breast stage I ductal carcinoma  Oncology History   Breast cancer of upper-outer quadrant of right female breast [facility])   Staging form: Breast, AJCC 7th Edition     Clinical stage from 04/07/2015: Stage IA (T1a, N0, M0) - Signed by Truitt Merle, MD on 05/20/2015     Pathologic stage from 04/27/2015: Stage IA (T1b, N0, cM0) - Signed by Truitt Merle, MD on 05/20/2015       Breast cancer of upper-outer quadrant of right female breast ([place])   04/01/2015 Mammogram Diagnostic mammogram and US showed a 0.7x 0.4 x 0.4 cm mass in the right breast 11:00 position, and a second 72m lesion at upper-midline, and a benign cluster of cysts measuring 1cm.    04/07/2015 Initial Biopsy Right breast UOQ mass core needle biopsy showed invasive ductal carcinoma and DCIS, grade 1. ER+ (100%), PR+ (80%), HER2/neu negative (ratio 1.25), Ki67 5%.   04/07/2015 Clinical Stage Stage IA: T1a N0   04/27/2015 Definitive Surgery Right breast double lumpectomy/SLNB: IDC, 0.9 cm, DCIS, invasive dz 0.1 cm from posterior margin, rem. margins >0.5 cm; second lump: fibrocystic change. 1 LN negative for malignancy   04/27/2015 Pathologic Stage Stage IA: T1b N0   06/21/2015 - 07/13/2015 Radiation Therapy Adjuvant RT: Right breast 42.72 Gy over 21 fractions.     Anti-estrogen oral therapy To discuss with Dr. FBurr Medicoat upcoming appointment (TBD)   09/29/2015 Survivorship Survivorship visit completed and copy of  care plan given to patient.     HISTORY OF PRESENTING ILLNESS:  [name]70y.o. female is here because of newly diagnosed right breast cancer. She is accompanied by her friend/roommate to our multidisciplinary clinic today.   This was discovered by screening mammogram, she did not have palpable mass, or any other new symptoms. She has been doing mammogram every year. The abnormal screening mammogram on 03/21/15 prompted a diagnostic mammogram and ultrasound on 04/01/2015, which showed 785mmass in the right breast 10:00, biopsy of the mass showed grade 1 invasive ductal carcinoma and DCIS. She also had a cystic lesion 5 mm at 11:00, and a biopsy showed fibrocystic change with focal ADH.  She developed pain and bruise after breast biopsy. But otherwise she feels well overall.  She has arthritis, mainly in fingers. She also has GERD, which is controlled by Nexium. 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 She has been taking estrogen and progesterone for the past 10 years, which she stopped a few days ago.  CURRENT THERAPY: Surveillance  INTERIM HISTORY [name] for follow-up. She completed breast irradiation on 07/13/2015. She tolerated very well overall. Her only complaint is she still has mild tenderness in the right nipple area, no right arm swollen or issues with the shoulder morbidity. She has a good appetite and eating well, remains to be physically active. She has had multiple family member and friends' death in the past few years before her own breast cancer diagnosis, with very depressed,  she is seen a psychiatrist and on medication. She feels her normal life just came back, and decided not to take any antiestrogen therapy, due to her concern of potential side effects.  MEDICAL HISTORY:  Past Medical History  Diagnosis Date  . Anxiety   . Hypertension   . Anxiety and depression   . Breast cancer of  upper-outer quadrant of right female breast ([place]) 04/11/2015  . Dengue fever   . Malaria   . Typhoid   . Depression   . Complication of anesthesia     "had a very hard time waking up"  . PONV (postoperative nausea and vomiting)   . GERD (gastroesophageal reflux disease)   . Headache   . Head injury, closed, with concussion ([place]) 1986    in [place]  . Head injury 1995    concussion in [place], med evac to [place], ear damage with vertigo  . Head injury, acute, with loss of consciousness ([place]) 2000    [place] the country  . Fall     broken nose  . Broken nose   . Breast cancer ([place]) 04/07/15    right breast  . Allergy     SURGICAL HISTORY: Past Surgical History  Procedure Laterality Date  . Surgical procedure for endometriosis    . Tonsillectomy    . Colonoscopy w/ biopsies and polypectomy    . Nerve surgery      '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' during surgery for endometriosis  . Breast lumpectomy with needle localization and axillary sentinel lymph node bx Right 04/27/2015    Procedure: RIGHT BREAST LUMPECTOMY WITH  TWO NEEDLE LOCALIZATION AND TWO RIGHT AXILLARY SENTINEL LYMPH NODE BX;  Surgeon: Fanny Skates, MD;  Location: [facility];  Service: General;  Laterality: Right;   GYN HISTORY  Menarchal: 33 LMP: 84 Contraceptive:no  HRT: yes, has been on for about 10 years  G0P0:   SOCIAL HISTORY: Social History   Social History  . Marital Status: Single    Spouse Name: N/A  . Number of Children: 0  . Years of Education: BA   Occupational History  .      [employer]   Social History Main Topics  . Smoking status: Former Smoker -- 30 years    Types: Cigarettes    Quit date: 01/11/1994  . Smokeless tobacco: Never Used  . Alcohol Use: 2.4 oz/week    4 Glasses of wine per week     Comment: moderate   . Drug Use: No  . Sexual Activity: No   Other Topics Concern  . Not on file   Social History Narrative   Patient lives at home with her friend.   Caffeine Use: 1/2 cup  daily    FAMILY HISTORY: Family History  Problem Relation Age of Onset  . Hypertension Mother   . Thyroid disease Mother   . Breast cancer Mother   . Emphysema Father   . Diabetes Maternal Grandmother   . Heart disease Paternal Grandmother   . Alcohol abuse Paternal Grandfather     ALLERGIES:  is allergic to codeine; percocet; and percodan.  MEDICATIONS:  Current Outpatient Prescriptions  Medication Sig Dispense Refill  . acetaminophen (TYLENOL) 500 MG tablet Take 500 mg by mouth 2 (two) times daily as needed for moderate pain (pain). Reported on 09/29/2015    . buPROPion (WELLBUTRIN XL) 300 MG 24 hr tablet Take 300 mg by mouth daily.   1  . Cyanocobalamin (VITAMIN B-12 SL) Place 1 tablet  under the tongue daily.    . diazepam (VALIUM) 5 MG tablet Take 2.5 mg by mouth daily as needed for anxiety.     . Esomeprazole Magnesium (NEXIUM PO) Take 22.3 mg by mouth every other day.    . ibuprofen (ADVIL,MOTRIN) 600 MG tablet Take 600 mg by mouth every 6 (six) hours as needed.    . Misc Natural Products (TART CHERRY ADVANCED) CAPS Take 3 capsules by mouth daily. Reported on 09/01/2015    . Multiple Vitamin (MULTIVITAMIN WITH MINERALS) TABS tablet Take 1 tablet by mouth at bedtime.    . sertraline (ZOLOFT) 100 MG tablet Take 100 mg by mouth daily.     . traZODone (DESYREL) 50 MG tablet TAKE 1 TO 1/2 TABLET BY MOUTH AT BEDTIME  1  . Vitamin D, Ergocalciferol, (DRISDOL) 50000 UNITS CAPS capsule Take 50,000 Units by mouth once a week. Reported on 09/29/2015  0   No current facility-administered medications for this visit.    REVIEW OF SYSTEMS:   Constitutional: Denies fevers, chills or abnormal night sweats Eyes: Denies blurriness of vision, double vision or watery eyes Ears, nose, mouth, throat, and face: Denies mucositis or sore throat Respiratory: Denies cough, dyspnea or wheezes Cardiovascular: Denies palpitation, chest discomfort or lower extremity swelling Gastrointestinal:  Denies  nausea, heartburn or change in bowel habits Skin: Denies abnormal skin rashes Lymphatics: Denies new lymphadenopathy or easy bruising Neurological:Denies numbness, tingling or new weaknesses Behavioral/Psych: Mood is stable, no new changes  All other systems were reviewed with the patient and are negative.  PHYSICAL EXAMINATION: ECOG PERFORMANCE STATUS: 1 - Symptomatic but completely ambulatory  Filed Vitals:   10/06/15 1531  BP: 138/94  Pulse: 81  Temp: 98.4 F (36.9 C)  Resp: 18   Filed Weights   10/06/15 1531  Weight: 179 lb 1.6 oz (81.239 kg)    GENERAL:alert, no distress and comfortable SKIN: skin color, texture, turgor are normal, no rashes or significant lesions EYES: normal, conjunctiva are pink and non-injected, sclera clear OROPHARYNX:no exudate, no erythema and lips, buccal mucosa, and tongue normal  NECK: supple, thyroid normal size, non-tender, without nodularity LYMPH:  no palpable lymphadenopathy in the cervical, axillary or inguinal LUNGS: clear to auscultation and percussion with normal breathing effort HEART: regular rate & rhythm and no murmurs and no lower extremity edema ABDOMEN:abdomen soft, non-tender and normal bowel sounds Musculoskeletal:no cyanosis of digits and no clubbing  PSYCH: alert & oriented x 3 with fluent speech NEURO: no focal motor/sensory deficits Breasts: Breast inspection showed them to be symmetrical with no nipple discharge. (+) Right breast incision site has healed well. (+) Mild skin edema and pigmentation of the right breast, especially around the nipple area, mild tenderness.  Palpation of the left breasts and axilla revealed no obvious mass that I could appreciate.   LABORATORY DATA:  I have reviewed the data as listed Lab Results  Component Value Date   WBC 10.2 04/22/2015   HGB 12.8 04/22/2015   HCT 39.5 04/22/2015   MCV 93.2 04/22/2015   PLT 363 04/22/2015    Recent Labs  04/13/15 0831 04/22/15 1530  NA 141 140    K 4.3 3.9  CL  --  108  CO2 24 23  GLUCOSE 94 106*  BUN 18.9 16  CREATININE 0.8 0.87  CALCIUM 9.8 9.1  GFRNONAA  --  >60  GFRAA  --  >60  PROT 7.2 7.0  ALBUMIN 3.7 3.9  AST 15 20  ALT 19 19  ALKPHOS  77 78  BILITOT 0.27 0.3    PATHOLOGY REPORT  Diagnosis 04/07/2015 1. Breast, right, needle core biopsy, upper midline - FIBROCYTIC CHANGES WITH FOCAL ATYPICAL DUCTAL HYPERPLASIA. - FIBROADENOMA. 2. Breast, right, needle core biopsy, upper outer - INVASIVE DUCTAL CARCINOMA. - DUCTAL CARCINOMA IN SITU. Microscopic Comment 1. There is focal atypical ductal hyperplasia associated with fibrocystic changes and a fibroadenoma. 2. The findings are consistent with grade 1 invasive ductal carcinoma. Breast prognostic profile will be performed.  2. PROGNOSTIC INDICATORS Results: IMMUNOHISTOCHEMICAL AND MORPHOMETRIC ANALYSIS PERFORMED MANUALLY Estrogen Receptor: 100%, POSITIVE, STRONG STAINING INTENSITY Progesterone Receptor: 80%, POSITIVE, STONG STAINING INTENSITY Proliferation Marker Ki67: 5% Results: HER2 - NEGATIVE RATIO OF HER2/CEP17 SIGNALS 1.25 AVERAGE HER2 COPY NUMBER PER CELL 2.00  Diagnosis 04/27/2015 1. Breast, lumpectomy, right upper outer quadrant - INVASIVE DUCTAL CARCINOMA, 0.9 CM. - DUCTAL CARCINOMA IN SITU. - INVASIVE CARCINOMA, 0.1 CM FROM POSTERIOR MARGIN. - REMAINING MARGINS GREATER THAN 0.5 CM. - FIBROCYSTIC CHANGES WITH CALCIFICATIONS. - BIOPSY SITE REACTION. 2. Breast, lumpectomy, right - FIBROCYSTIC CHANGES WITH FLORID USUAL DUCTAL HYPERPLASIA. - NO RESIDUAL ATYPICAL DUCTAL HYPERPLASIA. - BIOPSY SITE REACTION. 3. Lymph node, sentinel, biopsy, right axillary #1 - ONE BENIGN LYMPH NODE (0/1). 4. Fatty tissue, right additional axillary - BENIGN ADIPOSE TISSUE AND SKELETAL MUSCLE. - NO EVIDENCE OF MALIGNANCY. Microscopic Comment 1. BREAST, INVASIVE TUMOR, WITH LYMPH NODES PRESENT Specimen, including laterality and lymph node sampling (sentinel,  non-sentinel): Right breast and one sentinel lymph node. Procedure: Localized lumpectomy and sentinel lymph node biopsy. Histologic type: Ductal. Grade: 1 Tubule formation: 1 Nuclear pleomorphism: 1 Mitotic: 1 Tumor size (gross measurement): 0.9 cm Margins: Invasive, distance to closest margin: 0.1 cm from posterior margin. In-situ, distance to closest margin: 0.5 cm from posterior margin. If margin positive, focally or broadly: N/A Lymphovascular invasion: No Ductal carcinoma in situ: Present Grade: Low grade. Extensive intraductal component: No Lobular neoplasia: No Tumor focality: Unifocal. Treatment effect: No If present, treatment effect in breast tissue, lymph nodes or both: N/A Extent of tumor: Skin: N/A Nipple: N/A Skeletal muscle: Free of tumor. Lymph nodes: Examined: 1 Sentinel 0 Non-sentinel 1 Total Lymph nodes with metastasis: 0 Isolated tumor cells (< 0.2 mm): 0 Micrometastasis: (> 0.2 mm and < 2.0 mm): 0 Macrometastasis: (> 2.0 mm): 0 Extracapsular extension: N/A Breast prognostic profile: Case # [phone] Estrogen receptor: 100%, strong staining. Progesterone receptor: 80%, strong staining. Her 2 neu: Negative. Ratio 1.25. Will be repeated on current specimen. Ki-67: 5% Non-neoplastic breast: Fibrocystic changes. TNM: pT1b, pN0, pMX Results: HER2 - NEGATIVE RATIO OF HER2/CEP17 SIGNALS 1.33 AVERAGE HER2 COPY NUMBER PER CELL 2.00  Oncotype Dx, recurrence score 15, which predicts 10 year distant recurrence risk of 10% with tamoxifen.  RADIOGRAPHIC STUDIES: I have personally reviewed the radiological images as listed and agreed with the findings in the report.  MM diag and US Breast Ltd Uni Left Inc Axilla 04/01/2015  IMPRESSION: Irregular hypoechoic mass within the right breast at the 11 o'clock axis, 5 cm from the nipple, measuring 0.5 x 0.4 x 0.4 cm, corresponding to the mammographic finding. This is a suspicious finding for which ultrasound-guided  biopsy is recommended.  RECOMMENDATION: Ultrasound-guided biopsy of the right breast mass located at the 11 o'clock axis, 5 cm from the nipple, measuring 0.5 x 0.4 x 0.4 cm.  Ultrasound-guided biopsy is scheduled for August 23rd at 3 p.m.  I have discussed the findings and recommendations with the patient. Results were also provided in writing at the conclusion of the  visit. If applicable, a reminder letter will be sent to the patient regarding the next appointment.  BI-RADS CATEGORY  4: Suspicious.   Electronically Signed   By: Franki Cabot M.D.   On: 04/01/2015 16:31   Mm Screening Breast Tomo Bilateral  03/23/2015   CLINICAL DATA:  Screening.  EXAM: DIGITAL SCREENING BILATERAL MAMMOGRAM WITH 3D TOMO WITH CAD  COMPARISON:  Previous exam(s).  ACR Breast Density Category c: The breast tissue is heterogeneously dense, which may obscure small masses.  FINDINGS: In the right breast possible mass requires further evaluation.  In the left breast possible mass requires further evaluation.  Images were processed with CAD.  IMPRESSION: Further evaluation is suggested for possible mass in the right breast.  Further evaluation is suggested for possible mass in the left breast.  RECOMMENDATION: Diagnostic mammogram and possibly ultrasound of both breasts. (Code:FI-B-22M)  The patient will be contacted regarding the findings, and additional imaging will be scheduled.  BI-RADS CATEGORY  0: Incomplete. Need additional imaging evaluation and/or prior mammograms for comparison.   Electronically Signed   By: Lovey Newcomer M.D.   On: 03/23/2015 11:56    ASSESSMENT & PLAN:  70 year old Caucasian female, postmenopausal, was found to have a right breast cancer by screening mammogram.  1. Right breast invasive ductal carcinoma, pT1bN0M0, stage IA, grade 1, ER positive, PR positive, HER-2 negative, (+) DCIS and ADH -I previously reviewed her surgical path findings with her in great details. -She has stage I breast cancer, likely  has been cured with complete surgical resection.  -I reviewed her Oncotype DX test result with her in details. She has low risk of score (15), which predicts 10 year distant risk of recurrence about 10% with tamoxifen. I did not recommend adjuvant chemotherapy -given the strong ER/PR positivity, I recommend adjuvant endocrine therapy with aromatase inhibitor, to decrease the risk of cancer recurrence. The potential benefits (decrease risk of recurrence by about 40%) and side effects, we'll discussed with her in great details. After lengthy discussion, she declined antiestrogen therapy, due to the concern of side effects and her recently history of depression.  -She will continue breast cancer surveillance, with annual screening mammogram, self-exam and follow-up with [place] every 6-12 months -She is due for mammogram in Sep  -I encouraged her to eat healthy and exercise regularly.  -I encouraged her to take calcium and vitamin D for bone health  2. Depression -she will continue medication and follow up with her psychiatrist  Plan -she declined adjuvant antiestrogen therapy, agreed with surveillance -I'll see her back in 6 months with lab.  All questions were answered. The patient knows to call the clinic with any problems, questions or concerns. I spent 25 minutes counseling the patient face to face. The total time spent in the appointment was 30 minutes and more than 50% was on counseling.     Truitt Merle, MD 10/06/2015 5:42 PM

## 2015-10-06 NOTE — Therapy (Signed)
[facility] [address], [place], [place], [address] Phone: [phone]   Fax:  [phone]  Physical Therapy Treatment  Patient Details  Name: Jessica Roberts MRN: [id number] Date of Birth: [birth year] Referring Provider: Dr. Addison Lank   Encounter Date: 10/06/2015      PT End of Session - 10/06/15 1307    Visit Number 10   Number of Visits 20   Date for PT Re-Evaluation 10/27/15   Authorization Type BCBS Medicare G codes; KX at visit 15   PT Start Time 1229   PT Stop Time 1318   PT Time Calculation (min) 49 min   Activity Tolerance Patient tolerated treatment well   Behavior During Therapy [facility] for tasks assessed/performed      Past Medical History  Diagnosis Date  . Anxiety   . Hypertension   . Anxiety and depression   . Breast cancer of upper-outer quadrant of right female breast ([place]) 04/11/2015  . Dengue fever   . Malaria   . Typhoid   . Depression   . Complication of anesthesia     "had a very hard time waking up"  . PONV (postoperative nausea and vomiting)   . GERD (gastroesophageal reflux disease)   . Headache   . Head injury, closed, with concussion ([place]) 1986    in [place]  . Head injury 1995    concussion in [place], med evac to [place], ear damage with vertigo  . Head injury, acute, with loss of consciousness ([place]) 2000    [place]  . Fall     broken nose  . Broken nose   . Breast cancer ([place]) 04/07/15    right breast  . Allergy     Past Surgical History  Procedure Laterality Date  . Surgical procedure for endometriosis    . Tonsillectomy    . Colonoscopy w/ biopsies and polypectomy    . Nerve surgery      '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' during surgery for endometriosis  . Breast lumpectomy with needle localization and axillary sentinel lymph node bx Right 04/27/2015    Procedure: RIGHT BREAST LUMPECTOMY WITH  TWO NEEDLE LOCALIZATION AND TWO RIGHT AXILLARY SENTINEL LYMPH  NODE BX;  Surgeon: Fanny Skates, MD;  Location: [facility];  Service: General;  Laterality: Right;    There were no vitals filed for this visit.  Visit Diagnosis:  Neck muscle spasm  Neck stiffness  Shoulder stiffness, right  Neck pain on right side  Weakness of right upper extremity      Subjective Assessment - 10/06/15 1233    Subjective Pt would like to take next week off PT to see how her symptoms respond to assess ability to be on her own.              [facility] PT Assessment - 10/06/15 0001    Observation/Other Assessments   Focus on Therapeutic Outcomes (FOTO)  35% limitation                     [facility] Adult PT Treatment/Exercise - 10/06/15 0001    Neck Exercises: Theraband   Horizontal ADduction 20 reps;Other (comment)  yellow seated 2x10   Other Theraband Exercises D2 seated 2x10 with yellow band   Shoulder Exercises: Pulleys   Flexion 3 minutes   Moist Heat Therapy   Number Minutes Moist Heat 15 Minutes   Moist Heat Location Cervical   Electrical Stimulation   Electrical Stimulation Location  cervical   Electrical Stimulation Action IFC   Electrical Stimulation Parameters 15 minutes   Electrical Stimulation Goals Pain   Manual Therapy   Manual Therapy Soft tissue mobilization;Myofascial release   Myofascial Release soft tissue elongation and suboccipital release, bil cervical paraspinals and UT                  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[employer])   [place] #3   Title Cervical sidebending improved to 30 degrees needed or dressing   Status Achieved   PT SHORT TERM GOAL #4   Title Pain  improved by 25% with home and work activities   Status Achieved           PT Long Term Goals - 10/04/15 1320    PT LONG TERM GOAL #1   Title The patient will be independent in safe, self progression of HEP for further improvements in ROM, strength and pain reduction  10/27/15   Time 8   Period Weeks   Status On-going   PT LONG TERM GOAL #2   Title The patient will have improved right shoulder flexion/elevation to 160 degrees needed for overhead reaching into cabinets and the white board at work   Time 8   Period Weeks   Status On-going   PT [place] #3   Title Patient will have improved right glenohumeral, scapular and cervical strength to grossly 4/5 needed for lifting medium weight objects   Time 8   Period Weeks   Status On-going   PT LONG TERM GOAL #4   Title Patient will report a > 50% improvement in sleep   Time 8   Period Weeks   Status On-going   PT LONG TERM GOAL #5   Title Patient will report average pain with home and work ADLS [id number]    Time 8   Period Weeks   Status On-going   PT LONG TERM GOAL #6   Title FOTO functional outcome score improved from 55% limitation to 44% indicating improved function with less pain   Time 8   Period Weeks   Status On-going               Plan - 10/06/15 1240    Clinical Impression Statement Pt reports 60% overall improvement in symptoms since the start of care.  FOTO score is 35% limitation improved from 55% limitation at evaluation.  Pt is independent in postural exercises for strength progression.  Pt will take one week off from PT to see how she does with exercises and re-check with PT after a week.  Pt with continued neck and UT tension (Rt>Lt) and will benefit from skilled PT to relax muscles and strength postural muscles.     Pt will benefit from skilled therapeutic intervention in order to improve on the following deficits Decreased endurance;Decreased range of motion;Decreased strength;Impaired UE functional  use;Pain;Increased muscle spasms   Rehab Potential Good   PT Frequency 2x / week   PT Duration 8 weeks   PT Treatment/Interventions ADLs/Self Care Home Management;Cryotherapy;Electrical Stimulation;Moist Heat;Ultrasound;Therapeutic exercise;Patient/family  education;Taping;Dry needling;Manual techniques;Traction   PT Next Visit Plan Pt will do exercises at home for 1 week and resume PT after for strength and manual.     Consulted and Agree with Plan of Care Patient          G-Codes - Nov 01, 2015 1239    Functional Assessment Tool Used FOTO: 35% limitation   Functional Limitation Carrying, moving and handling objects   Carrying, Moving and Handling Objects Current Status [id number]) At least 20 percent but less than 40 percent impaired, limited or restricted   Carrying, Moving and Handling Objects Goal Status [id number]) At least 20 percent but less than 40 percent impaired, limited or restricted      Problem List Patient Active Problem List   Diagnosis Date Noted  . Breast cancer of upper-outer quadrant of right female breast ([place]) 04/11/2015    TAKACS,KELLY, PT 2015/11/01, 1:10 PM  [facility] [address], [place], [place], [address] Phone: [phone]   Fax:  [phone]  Name: Jessica Roberts MRN: [id number] Date of Birth: January 24, 1946

## 2015-10-06 NOTE — Telephone Encounter (Signed)
per pof to sch pt appt-gave pt copy of avs-adv Peter all to sch appt

## 2015-10-12 ENCOUNTER — Ambulatory Visit: Payer: Medicare Other | Attending: Hematology | Admitting: Physical Therapy

## 2015-10-12 DIAGNOSIS — M629 Disorder of muscle, unspecified: Secondary | ICD-10-CM | POA: Diagnosis not present

## 2015-10-12 DIAGNOSIS — M25511 Pain in right shoulder: Secondary | ICD-10-CM | POA: Diagnosis not present

## 2015-10-12 DIAGNOSIS — M25611 Stiffness of right shoulder, not elsewhere classified: Secondary | ICD-10-CM | POA: Diagnosis not present

## 2015-10-12 DIAGNOSIS — I890 Lymphedema, not elsewhere classified: Secondary | ICD-10-CM

## 2015-10-12 NOTE — Therapy (Signed)
[place], [place], [address] Phone: [phone]   Fax:  [phone]  Physical Therapy Evaluation  Patient Details  Name: Jessica Roberts MRN: [id number] Date of Birth: 1946-07-13 Referring Provider: Dr. Truitt Merle  Encounter Date: 10/12/2015      PT End of Session - 10/12/15 1705    Visit Number 1  for cancer rehab   Number of Visits --  9 for cancer rehab   Date for PT Re-Evaluation 11/12/15  for cancer rehab   PT Start Time 1615   PT Stop Time 1710   PT Time Calculation (min) 55 min   Activity Tolerance Patient tolerated treatment well   Behavior During Therapy --  brief tearfulness      Past Medical History  Diagnosis Date  . Anxiety   . Hypertension   . Anxiety and depression   . Breast cancer of upper-outer quadrant of right female breast ([place]) 04/11/2015  . Dengue fever   . Malaria   . Typhoid   . Depression   . Complication of anesthesia     "had a very hard time waking up"  . PONV (postoperative nausea and vomiting)   . GERD (gastroesophageal reflux disease)   . Headache   . Head injury, closed, with concussion ([place]) 1986    in [place]  . Head injury 1995    concussion in [place], med evac to [place], ear damage with vertigo  . Head injury, acute, with loss of consciousness ([place]) 2000    [place] of the country  . Fall     broken nose  . Broken nose   . Breast cancer ([place]) 04/07/15    right breast  . Allergy     Past Surgical History  Procedure Laterality Date  . Surgical procedure for endometriosis    . Tonsillectomy    . Colonoscopy w/ biopsies and polypectomy    . Nerve surgery      '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' during surgery for endometriosis  . Breast lumpectomy with needle localization and axillary sentinel lymph node bx Right 04/27/2015    Procedure: RIGHT BREAST LUMPECTOMY WITH  TWO NEEDLE LOCALIZATION AND TWO RIGHT AXILLARY SENTINEL LYMPH NODE BX;  Surgeon:  Fanny Skates, MD;  Location: [facility];  Service: General;  Laterality: Right;    There were no vitals filed for this visit.  Visit Diagnosis:  Pain in joint, shoulder region, right - Plan: PT plan of care cert/re-cert  Disorder of fascia - Plan: PT plan of care cert/re-cert  Acquired lymphedema - Plan: PT plan of care cert/re-cert      Subjective Assessment - 10/12/15 1613    Subjective Dr. Burr Medico sent her for swelling and some pain in breast. Pt. feels she is overweight.     Pertinent History Right breast invasive ductal carcinoma, [id number], stage IA, grade 1, ER positive, PR positive, HER-2 negative, (+) DCIS and ADH.  Lumpectomy in September with two separate incisions.  Surgeon released her and was to have given her a referral for PT, but she didn't get it.  Has had neck and shoulder problems since May 2016 and has had chiropractic and acupuncture, but then was diagnosed with cancer.  Started PT for that a month ago.  Saw Dr. Burr Medico , who found swelling at right axilla and has a little bit of pain from time to time at right inferior axilla at end of incision; leaning forward or mashing the right breast causes pain.  Depression under control with meds.   Patient Stated Goals see about swelling and breast pain   Currently in Pain? Yes   Pain Score 3    Pain Location Neck   Pain Orientation Right;Posterior   Pain Descriptors / Indicators Other (Comment)  "just a tad" but 1000x better than before starting PT   Pain Frequency Constant   Aggravating Factors  constant   Pain Relieving Factors ibuprofen   Multiple Pain Sites Yes   Pain Score 0  to 2 if bad   Pain Location Axilla   Pain Orientation Right   Pain Descriptors / Indicators Other (Comment)  just a pain; will jab intermittently when sitting quietly   Aggravating Factors  stretching arm up   Pain Relieving Factors staying still            [facility] PT Assessment - 10/12/15 0001    Assessment   Medical Diagnosis right breast  cancer with swelling   Referring Provider Dr. Truitt Merle   Precautions   Precautions Other (comment)   Precaution Comments cancer precautions   Restrictions   Weight Bearing Restrictions No   Balance Screen   Has the patient fallen in the past 6 months No   Has the patient had a decrease in activity level because of a fear of falling?  No   Is the patient reluctant to leave their home because of a fear of falling?  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  Lumpectomy scar at right upper outer breast is healing very well.  Pt. describes a knot behind that spot that can be tender when "mashed on."  Appears to have a pocket of swelling in area of right axilla just superior to incision there.   Quick DASH  45.45   AROM   Right Shoulder Flexion 126 Degrees  pain medial to axillary incision so stopped motion there   Right Shoulder ABduction 110 Degrees  stopped when had pain at just medial to end of axillary scar   Right Shoulder External Rotation --  [facility] and doesn't hurt           LYMPHEDEMA/ONCOLOGY QUESTIONNAIRE - 10/12/15 1632    Type   Cancer Type     Right Upper Extremity Lymphedema   Other circumference measurement around chest at about an inch inferior to axilla, arms down, on full exhale:  98.8 cm.           Katina Dung - 10/12/15 0001    Open a tight or new jar Severe difficulty   Do heavy household chores (wash walls, wash floors) Severe difficulty   Carry a shopping bag or briefcase  Moderate difficulty   Wash your back Moderate difficulty   Use a knife to cut food Mild difficulty   Recreational activities in which you take some force or impact through your arm, shoulder, or hand (golf, hammering, tennis) Mild difficulty   During the past week, to what extent has your arm, shoulder or hand problem interfered with  your normal social activities with family, friends, neighbors, or groups? Modererately   During the past week, to what extent has your arm, shoulder or hand problem limited your work or other regular daily activities Modererately   Arm, shoulder, or hand pain. 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[employer])   [place] #3   Title Cervical sidebending improved to 30 degrees needed or dressing   Status Achieved   PT SHORT TERM GOAL #4   Title Pain improved by 25% with home and work activities   Status Achieved           PT Long Term Goals - 10/04/15 1320    PT LONG TERM GOAL #1   Title The patient will be independent in safe, self progression of HEP for further improvements in ROM, strength and pain reduction  10/27/15   Time 8   Period Weeks   Status On-going   PT LONG TERM GOAL #2   Title The patient will have improved right shoulder flexion/elevation to 160 degrees needed for overhead reaching into cabinets and the white board at work   Time 8   Period Weeks   Status On-going   PT [place] #3   Title Patient will have improved right  glenohumeral, scapular and cervical strength to grossly 4/5 needed for lifting medium weight objects   Time 8   Period Weeks   Status On-going   PT LONG TERM GOAL #4   Title Patient will report a > 50% improvement in sleep   Time 8   Period Weeks   Status On-going   PT LONG TERM GOAL #5   Title Patient will report average pain with home and work ADLS 2/42    Time 8   Period Weeks   Status On-going   PT LONG TERM GOAL #6   Title FOTO functional outcome score improved from 55% limitation to 44% indicating improved function with less pain   Time 8   Period Weeks   Status On-going           Long Term Clinic Goals - 10/12/15 2022    [facility] Long Term Goal  #1   Title Patient will report at least 60% decrease in discomfort at right chest area with moving the right arm.   Time 4   Period Weeks   Status New   CC Long Term Goal  #2   Title Patient will report at least 60% perceived improvement in swelling at right upper flank.   Time 4   Period Weeks   Status New   CC Long Term Goal  #3   Title Patient will be independent in home exercise program and self-care for edema, including manual lymph drainage.   Time 4   Period Weeks   Status New  Plan - 11/01/2015 11/08/2007    Clinical Impression Statement Patient is highly motivated to get better.  She is s/p right lumpectomy for breast cancer, upper breast, with second incision for lymph node excision, 4 nodes removed.  She has also had radiation.  Current complaint for coming to this therapy clinic is pain with moving right arm, and some swelling at upper right flank.  She does have some tightness around the axillary scar in particular. amd the appearance of pulling there when she raises the right arm.  The swelling is just superior to this incision, for the most part.  Both should benefit from soft tissue mobilization and myofascial release; swelling should also improve with manual lymph drainage and compression.   Pt will  benefit from skilled therapeutic intervention in order to improve on the following deficits Pain;Decreased range of motion;Decreased scar mobility;Increased edema  for CA rehab   Rehab Potential Good   PT Frequency 2x / week   PT Duration 4 weeks  for CA rehab   PT Treatment/Interventions ADLs/Self Care Home Management;Electrical Stimulation;DME Instruction;Therapeutic exercise;Patient/family education;Manual techniques;Manual lymph drainage;Scar mobilization;Passive range of motion;Taping   PT Next Visit Plan For cancer rehab, focus on axillary scar mobilization and myofascial release in that area; do manual lymph drainage for swelling at upper right flank; HEP instruction for same as time allows.   Consulted and Agree with Plan of Care Patient          G-Codes - November 01, 2015 11-08-2023    Functional Assessment Tool Used clinical judgement for self care/cancer rehab therapy   Functional Limitation Self care   Self Care Current Status [phone]) At least 40 percent but less than 60 percent impaired, limited or restricted   Self Care Goal Status ([id number]) At least 40 percent but less than 60 percent impaired, limited or restricted   Self Care Discharge Status [phone]) At least 40 percent but less than 60 percent impaired, limited or restricted       Problem List Patient Active Problem List   Diagnosis Date Noted  . Breast cancer of upper-outer quadrant of right female breast ([place]) 04/11/2015    SALISBURY,DONNA 2015/11/01, 8:31 PM  [place], [place], [address] Phone: [phone]   Fax:  [phone]  Name: Jessica Roberts MRN: [id number] Date of Birth: 05/14/1946   Serafina Royals, PT 11-01-2015 8:31 PM

## 2015-10-13 ENCOUNTER — Ambulatory Visit: Payer: Medicare Other

## 2015-10-13 DIAGNOSIS — M25511 Pain in right shoulder: Secondary | ICD-10-CM

## 2015-10-13 DIAGNOSIS — I890 Lymphedema, not elsewhere classified: Secondary | ICD-10-CM

## 2015-10-13 DIAGNOSIS — M629 Disorder of muscle, unspecified: Secondary | ICD-10-CM

## 2015-10-13 NOTE — Therapy (Signed)
[facility] [address] [place], [place], [address] Phone: [phone]   Fax:  [phone]  Physical Therapy Treatment  Patient Details  Name: Jessica Roberts MRN: [id number] Date of Birth: 08/22/1945 Referring Provider: Dr. Yan Feng  Encounter Date: 10/13/2015      PT End of Session - 10/13/15 1027    Visit Number 2  Cancer Rehab   Number of Visits --  9 for cancer rehab   Date for PT Re-Evaluation 11/12/15  cancer rehab   PT Start Time 0934   PT Stop Time 1023   PT Time Calculation (min) 49 min   Activity Tolerance Patient tolerated treatment well   Behavior During Therapy WFL for tasks assessed/performed      Past Medical History  Diagnosis Date  . Anxiety   . Hypertension   . Anxiety and depression   . Breast cancer of upper-outer quadrant of right female breast (HCC) 04/11/2015  . Dengue fever   . Malaria   . Typhoid   . Depression   . Complication of anesthesia     "had a very hard time waking up"  . PONV (postoperative nausea and vomiting)   . GERD (gastroesophageal reflux disease)   . Headache   . Head injury, closed, with concussion (HCC) 1986    [place]  . Head injury 1995    concussion in [place], [place] evac to [place], ear damage with vertigo  . Head injury, acute, with loss of consciousness (HCC) 2000    [place]  . Fall     broken nose  . Broken nose   . Breast cancer (HCC) 04/07/15    right breast  . Allergy     Past Surgical History  Procedure Laterality Date  . Surgical procedure for endometriosis    . Tonsillectomy    . Colonoscopy w/ biopsies and polypectomy    . Nerve surgery      '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' during surgery for endometriosis  . Breast lumpectomy with needle localization and axillary sentinel lymph node bx Right 04/27/2015    Procedure: RIGHT BREAST LUMPECTOMY WITH  TWO NEEDLE LOCALIZATION AND TWO RIGHT AXILLARY SENTINEL LYMPH NODE BX;  Surgeon:  Haywood Ingram, MD;  Location: [facility];  Service: General;  Laterality: Right;    There were no vitals filed for this visit.  Visit Diagnosis:  Pain in joint, shoulder region, right  Disorder of fascia  Acquired lymphedema      Subjective Assessment - 10/12/15 1613    Subjective Dr. Feng sent her for swelling and some pain in breast. Pt. feels she is overweight.     Pertinent History Right breast invasive ductal carcinoma, [id number], stage IA, grade 1, ER positive, PR positive, HER-2 negative, (+) DCIS and ADH.  Lumpectomy in September with two separate incisions.  Surgeon released her and was to have given her a referral for PT, but she didn't get it.  Has had neck and shoulder problems since May 2016 and has had chiropractic and acupuncture, but then was diagnosed with cancer.  Started PT for that a month ago.  Saw Dr. Feng , who found swelling at right axilla and has a little bit of pain from time to time at right inferior axilla at end of incision; leaning forward or mashing the right breast causes pain.  Depression under control with meds.   Patient Stated Goals see about swelling and breast pain   Currently in Pain? Yes     Pain Score 3    Pain Location Neck   Pain Orientation Right;Posterior   Pain Descriptors / Indicators Other (Comment)  "just a tad" but 1000x better than before starting PT   Pain Frequency Constant   Aggravating Factors  constant   Pain Relieving Factors ibuprofen   Multiple Pain Sites Yes   Pain Score 0  to 2 if bad   Pain Location Axilla   Pain Orientation Right   Pain Descriptors / Indicators Other (Comment)  just a pain; will jab intermittently when sitting quietly   Aggravating Factors  stretching arm up   Pain Relieving Factors staying still               LYMPHEDEMA/ONCOLOGY QUESTIONNAIRE - 10/12/15 1632    Type   Cancer Type     Right Upper Extremity Lymphedema   Other circumference measurement around chest at about an inch inferior to  axilla, arms down, on full exhale:  98.8 cm.                  [facility] Adult PT Treatment/Exercise - 10/13/15 0001    Manual Therapy   Myofascial Release To Rt axilla and Rt UE pulling with diagonal stretching in opposite direction and over larger incision at Rt chest.   Manual Lymphatic Drainage (MLD) In Supine: Short neck, superficial and deep abdominals, Rt inguinal nodes and Rt axillo-inguinal anastomosis, Lt axilla nodes and anterior inter-axillary anastomosis and focused on lateral and inferior Rt breast redirecting along pathways, mostly lateral trunk.   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[employer])   Status Achieved   PT SHORT TERM GOAL #3   Title Cervical sidebending improved to 30 degrees needed or dressing   Status Achieved   PT SHORT TERM GOAL #4   Title Pain improved by 25% with home and work activities   Status Achieved           PT Long Term Goals - 10/04/15 1320    PT LONG TERM GOAL #1   Title The patient will be independent in safe, self progression of HEP for further improvements in ROM, strength and pain reduction  10/27/15   Time 8   Period Weeks   Status On-going   PT LONG TERM GOAL #2   Title The patient will have improved right shoulder flexion/elevation to 160 degrees needed for overhead reaching into cabinets and the white board at work   Time 8   Period Weeks   Status On-going   PT LONG TERM GOAL #3   Title Patient will have improved right glenohumeral, scapular and cervical strength to grossly 4/5 needed for lifting medium weight objects    Time 8   Period Weeks   Status On-going   PT LONG TERM GOAL #4   Title Patient will report a > 50% improvement in sleep   Time 8   Period Weeks   Status On-going   PT LONG TERM GOAL #  5   Title Patient will report average pain with home and work ADLS 0/09    Time 8   Period Weeks   Status On-going   PT LONG TERM GOAL #6   Title FOTO functional outcome score improved from 55% limitation to 44% indicating improved function with less pain   Time 8   Period Weeks   Status On-going           Long Term Clinic Goals - 11/05/15 09-30-20    CC Long Term Goal  #1   Title Patient will report at least 60% decrease in discomfort at right chest area with moving the right arm.   Time 4   Period Weeks   Status New   CC Long Term Goal  #2   Title Patient will report at least 60% perceived improvement in swelling at right upper flank.   Time 4   Period Weeks   Status New   CC Long Term Goal  #3   Title Patient will be independent in home exercise program and self-care for edema, including manual lymph drainage.   Time 4   Period Weeks   Status New            Plan - 10/13/15 1028    Clinical Impression Statement Pt did well with initial treatment of self manual lymph drainage and myofascial release. Did have some c/o tenderness that increased at lateral breast and intermittent sharp pain from Rt axilla to nipple but these were inconsistent and did not limit stretching.    Pt will benefit from skilled therapeutic intervention in order to improve on the following deficits Pain;Decreased range of motion;Decreased scar mobility;Increased edema  CA rehab   Rehab Potential Good   Clinical Impairments Affecting Rehab Potential Breast CA with surgery (patient reports short term memory deficits as a result)   hx of 2 head injuries, hx of vertigo   PT Frequency 2x / week   PT Duration 4 weeks  CA rehab   PT Treatment/Interventions ADLs/Self Care Home Management;Electrical Stimulation;DME  Instruction;Therapeutic exercise;Patient/family education;Manual techniques;Manual lymph drainage;Scar mobilization;Passive range of motion;Taping   PT Next Visit Plan For cancer rehab, focus on axillary scar mobilization and myofascial release in that area; do manual lymph drainage for swelling at upper right flank; begin HEP instruction for self manual lymph drainage.   Consulted and Agree with Plan of Care Patient          G-Codes - 11/05/2015 Oct 01, 2023    Functional Assessment Tool Used clinical judgement for self care/cancer rehab therapy   Functional Limitation Self care   Self Care Current Status [phone]) At least 40 percent but less than 60 percent impaired, limited or restricted   Self Care Goal Status (T6226) At least 40 percent but less than 60 percent impaired, limited or restricted   Self Care Discharge Status [phone]) At least 40 percent but less than 60 percent impaired, limited or restricted      Problem List Patient Active Problem List   Diagnosis Date Noted  . Breast cancer of upper-outer quadrant of right female breast ([place]) 04/11/2015    Otelia Limes, PTA 10/13/2015, 10:39 AM  [place], [place], [address] Phone: [phone]   Fax:  [phone]  Name: Jessica Roberts MRN: [id number] Date of Birth: 1945/12/27

## 2015-10-18 ENCOUNTER — Ambulatory Visit: Payer: Medicare Other | Admitting: Physical Therapy

## 2015-10-18 ENCOUNTER — Ambulatory Visit: Payer: Medicare Other | Attending: Family Medicine | Admitting: Physical Therapy

## 2015-10-18 DIAGNOSIS — M25511 Pain in right shoulder: Secondary | ICD-10-CM | POA: Diagnosis not present

## 2015-10-18 DIAGNOSIS — M6248 Contracture of muscle, other site: Secondary | ICD-10-CM | POA: Diagnosis not present

## 2015-10-18 DIAGNOSIS — M629 Disorder of muscle, unspecified: Secondary | ICD-10-CM | POA: Insufficient documentation

## 2015-10-18 DIAGNOSIS — M25611 Stiffness of right shoulder, not elsewhere classified: Secondary | ICD-10-CM

## 2015-10-18 DIAGNOSIS — M62838 Other muscle spasm: Secondary | ICD-10-CM

## 2015-10-18 DIAGNOSIS — M542 Cervicalgia: Secondary | ICD-10-CM | POA: Insufficient documentation

## 2015-10-18 DIAGNOSIS — I890 Lymphedema, not elsewhere classified: Secondary | ICD-10-CM

## 2015-10-18 DIAGNOSIS — M436 Torticollis: Secondary | ICD-10-CM | POA: Diagnosis not present

## 2015-10-18 NOTE — Therapy (Signed)
[place], [place], [address] Phone: [phone]   Fax:  [phone]  Physical Therapy Treatment  Patient Details  Name: Jessica Roberts MRN: [id number] Date of Birth: [birth year] Referring Provider: Dr. Truitt Merle  Encounter Date: 10/18/2015      PT End of Session - 10/18/15 1533    Visit Number 3  cancer rehab   Number of Visits --  9 for cancer rehab   Date for PT Re-Evaluation 11/12/15  cancer rehab   Authorization Type G-code visit 14 on 10/18/15   PT Start Time 1443   PT Stop Time 1525   PT Time Calculation (min) 42 min   Activity Tolerance Patient tolerated treatment well   Behavior During Therapy [facility] for tasks assessed/performed      Past Medical History  Diagnosis Date  . Anxiety   . Hypertension   . Anxiety and depression   . Breast cancer of upper-outer quadrant of right female breast ([place]) 04/11/2015  . Dengue fever   . Malaria   . Typhoid   . Depression   . Complication of anesthesia     "had a very hard time waking up"  . PONV (postoperative nausea and vomiting)   . GERD (gastroesophageal reflux disease)   . Headache   . Head injury, closed, with concussion ([place]) 1986    in [place]  . Head injury 1995    concussion in [place], med evac to [place], ear damage with vertigo  . Head injury, acute, with loss of consciousness ([place]) 2000    [place]  . Fall     broken nose  . Broken nose   . Breast cancer ([place]) 04/07/15    right breast  . Allergy     Past Surgical History  Procedure Laterality Date  . Surgical procedure for endometriosis    . Tonsillectomy    . Colonoscopy w/ biopsies and polypectomy    . Nerve surgery      '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' during surgery for endometriosis  . Breast lumpectomy with needle localization and axillary sentinel lymph node bx Right 04/27/2015    Procedure: RIGHT BREAST LUMPECTOMY WITH  TWO NEEDLE LOCALIZATION AND TWO  RIGHT AXILLARY SENTINEL LYMPH NODE BX;  Surgeon: Fanny Skates, MD;  Location: [facility];  Service: General;  Laterality: Right;    There were no vitals filed for this visit.  Visit Diagnosis:  Pain in joint, shoulder region, right  Disorder of fascia  Acquired lymphedema  Shoulder stiffness, right      Subjective Assessment - 10/18/15 1443    Subjective Was in a really grumpy mood because I don't have control of my life yet.  Had other PT treatment today and that helped.  Did okay after last session; didn't have any pain.   Currently in Pain? Yes   Pain Score 1    Pain Location Axilla   Pain Orientation Right   Pain Descriptors / Indicators Other (Comment)  twinge   Pain Relieving Factors being still            [facility] PT Assessment - 10/18/15 0001    AROM   Cervical Flexion 75   Cervical Extension 55   Cervical - Right Side Bend 54   Cervical - Left Side Bend 45   Cervical - Right Rotation 40   Cervical - Left Rotation 50  [place] Adult PT Treatment/Exercise - 10/18/15 1500    Shoulder Exercises: Seated   Other Seated Exercises active shoulder rolls backward, shoulder shrugs and relaxation   Shoulder Exercises: Sidelying   Other Sidelying Exercises D2 for right arm in left sidelying x 5, active-assist, then actively   Manual Therapy   Manual Therapy Passive ROM;Scapular mobilization   Soft tissue mobilization soft tissue mobilization at both breast and axillary incisions in supine; in left sidelying, to right scar area   Myofascial Release right UE myofascial pulling with partial range abduction (to tolerance, limited); crosshands technique with distraction at right arm and concurrent stretch horizontally at chest; crosshands technique at chest in horizontal, vertical, and diagonal directions (especially right side);   Scapular Mobilization to right side in left sidelying, for protraction and depression   Passive ROM right shoulder into ER,  abduction and flexion to tolerance; pect minor stretches; in left sidelying, right shoulder abduction to tolerance   [place]                    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[employer])   [place] #3   Title Cervical sidebending improved to 30 degrees needed or dressing   Status Achieved   PT SHORT TERM GOAL #4   Title Pain improved by 25% with home and work activities   Status Achieved           PT Long Term Goals - 10/18/15 1242    PT LONG TERM GOAL #1   Title The patient will be independent in safe, self progression of HEP for further improvements in ROM, strength and pain reduction  10/27/15   Time 8   Period Weeks   Status On-going   PT LONG TERM GOAL #2   Title The patient will have improved right shoulder flexion/elevation to 160 degrees needed for overhead reaching into cabinets and the white board at work   Time 8   Period Weeks   Status On-going   PT [place] #3   Title Patient will have improved right glenohumeral, scapular and cervical strength to grossly 4/5 needed for lifting medium weight objects   Time 8   Period Weeks   Status On-going   PT LONG TERM GOAL #4   Title Patient will report a > 50% improvement in sleep   Time 8   Period Weeks   Status On-going   PT LONG TERM GOAL #5   Title Patient will report average pain with home and work ADLS [id number]    Time 8   Period Weeks   Status On-going   PT LONG TERM GOAL #6   Title FOTO functional outcome score improved from 55% limitation to 44% indicating improved function with less pain   Time 8   Period Weeks   Status On-going           Long  Term Clinic Goals - 10/12/15 2022    CC Long Term Goal  #1   Title Patient will report at least 60% decrease in discomfort at right chest area with  moving the right arm.   Time 4   Period Weeks   Status New   CC Long Term Goal  #2   Title Patient will report at least 60% perceived improvement in swelling at right upper flank.   Time 4   Period Weeks   Status New   CC Long Term Goal  #3   Title Patient will be independent in home exercise program and self-care for edema, including manual lymph drainage.   Time 4   Period Weeks   Status New            Plan - 10/18/15 1535    Clinical Impression Statement Patient with limited tolerance for right shoulder passive abduction; also with difficulty relaxing.  She did do well overall with therapy session today for right chest/shoulder area tightness related to breast cancer treatment.   Pt will benefit from skilled therapeutic intervention in order to improve on the following deficits Pain;Decreased range of motion;Decreased scar mobility;Increased edema   Rehab Potential Good   Clinical Impairments Affecting Rehab Potential Breast CA with surgery (patient reports short term memory deficits as a result)   hx of 2 head injuries, hx of vertigo   PT Frequency 2x / week   PT Duration 4 weeks   PT Treatment/Interventions Manual techniques;Passive range of motion;Therapeutic exercise   PT Next Visit Plan Reasses. For cancer rehab, continue myofascial work at right shoulder/chest area   [place] and Agree with Plan of Care Patient        Problem List Patient Active Problem List   Diagnosis Date Noted  . Breast cancer of upper-outer quadrant of right female breast ([place]) 04/11/2015    SALISBURY,DONNA 10/18/2015, 3:48 PM  [place], [place], [address] Phone: [phone]   Fax:  [phone]  Name: Jessica Roberts MRN: [id number] Date of Birth:  1946-06-18    Serafina Royals, PT 10/18/2015 3:48 PM

## 2015-10-18 NOTE — Therapy (Signed)
[facility] [address], [place], [place], [address] Phone: [phone]   Fax:  [phone]  Physical Therapy Treatment  Patient Details  Name: Jessica Roberts MRN: [id number] Date of Birth: 1946-06-12 Referring Provider: Dr. Truitt Merle  Encounter Date: 10/18/2015      PT End of Session - 10/18/15 1424    Visit Number 11   Date for PT Re-Evaluation 10/27/15   Authorization Type [employer] Medicare G codes; KX at visit 15   PT Start Time 1230   PT Stop Time 1325   PT Time Calculation (min) 55 min   Activity Tolerance Patient tolerated treatment well      Past Medical History  Diagnosis Date  . Anxiety   . Hypertension   . Anxiety and depression   . Breast cancer of upper-outer quadrant of right female breast ([place]) 04/11/2015  . Dengue fever   . Malaria   . Typhoid   . Depression   . Complication of anesthesia     "had a very hard time waking up"  . PONV (postoperative nausea and vomiting)   . GERD (gastroesophageal reflux disease)   . Headache   . Head injury, closed, with concussion ([place]) 1986    in [place]  . Head injury 1995    concussion in [place], med evac to [place], ear damage with vertigo  . Head injury, acute, with loss of consciousness ([place]) 2000    [place] country  . Fall     broken nose  . Broken nose   . Breast cancer ([place]) 04/07/15    right breast  . Allergy     Past Surgical History  Procedure Laterality Date  . Surgical procedure for endometriosis    . Tonsillectomy    . Colonoscopy w/ biopsies and polypectomy    . Nerve surgery      '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' during surgery for endometriosis  . Breast lumpectomy with needle localization and axillary sentinel lymph node bx Right 04/27/2015    Procedure: RIGHT BREAST LUMPECTOMY WITH  TWO NEEDLE LOCALIZATION AND TWO RIGHT AXILLARY SENTINEL LYMPH NODE BX;  Surgeon: Fanny Skates, MD;  Location: [facility];  Service: General;   Laterality: Right;    There were no vitals filed for this visit.  Visit Diagnosis:  Neck muscle spasm  Neck stiffness      Subjective Assessment - 10/18/15 1238    Subjective Patient has started Cancer rehab to help with swelling.  I'm grumpy today b/c I feel my life is out of control.  I wanted to take a Valium this morning but I ran out.  I didn't do my ex's b/c I was out of town, it was too much stress and it made me grumpy.     Currently in Pain? Yes   Pain Score 3    Pain Location Neck   Pain Orientation Right   Pain Type Chronic pain   Pain Onset More than a month ago   Pain Frequency Constant            [facility] PT Assessment - 10/18/15 0001    AROM   Cervical Flexion 75   Cervical Extension 55   Cervical - Right Side Bend 54   Cervical - Left Side Bend 45   Cervical - Right Rotation 40   Cervical - Left Rotation 50  [place] Adult PT Treatment/Exercise - 10/18/15 0001    Moist Heat Therapy   Number Minutes Moist Heat 15 Minutes   Moist Heat Location Cervical   Electrical Stimulation   Electrical Stimulation Location cervical   Electrical Stimulation Action IFC   Electrical Stimulation Parameters 7 ma 15 min   Electrical Stimulation Goals Pain   Manual Therapy   Joint Mobilization C4-C7 PA grade 2 mobs 10x each level   Soft tissue mobilization right upper trap, levator scap, suboccipitals: elongation and trigger point release   Manual Traction 3x 25 sec   Muscle Energy Technique right upper trap 6x 5 sec holds                  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[employer])   Status Achieved   PT [place] #3   Title Cervical sidebending improved to 30 degrees needed or dressing   Status Achieved   PT SHORT TERM GOAL #4   Title Pain improved by 25% with home and work activities   Status Achieved           PT Long Term Goals - 10/18/15 1242    PT LONG TERM GOAL #1   Title The patient will be independent in safe, self progression of HEP for further improvements in ROM, strength and pain reduction  10/27/15   Time 8   Period Weeks   Status On-going   PT LONG TERM GOAL #2   Title The patient will have improved right shoulder flexion/elevation to 160 degrees needed for overhead reaching into cabinets and the white board at work   Time 8   Period Weeks   Status On-going   PT LONG TERM GOAL #3   Title Patient will have improved right glenohumeral, scapular and cervical strength to grossly 4/5 needed for lifting medium weight objects   Time 8   Period Weeks   Status On-going   PT LONG TERM GOAL #4   Title Patient will report a > 50% improvement in sleep   Time 8   Period Weeks   Status On-going   PT LONG TERM GOAL #5   Title Patient will report average pain with home and work ADLS [id number]    Time 8   Period Weeks   Status On-going   PT LONG TERM GOAL #6   Title FOTO functional outcome score improved from 55% limitation to 44% indicating improved function with less pain   Time 8   Period Weeks   Status On-going           Long Term Clinic Goals - 10/12/15 2022    CC Long Term Goal  #1   Title Patient will report at least 60% decrease in discomfort at right chest area with moving the right arm.   Time 4   Period Weeks   Status New   CC Long Term Goal  #2   Title Patient will report at least 60% perceived improvement in swelling at right upper flank.   Time 4   Period Weeks   Status New   CC Long Term  Goal  #3   Title Patient will be independent in home exercise program and self-care for edema, including manual lymph drainage.   Time 4   Period Weeks   Status New             Plan - 10/18/15 1425    Clinical Impression Statement The patient presents reporting she is agitated and very "grumpy" today secondary personal issues.  She has been to 2 appts for cancer rehab to focus on her swelling of UE following breast CA  but feels this neck issue is a different issue.  She would like to continue PT for neck pain at a decreased frequency of 1x/week.  Cervical AROM is limited today secondary to spams and pain.  Improved muscle length following treatment session.   Therapist closely monitoring response throughout session.     PT Next Visit Plan Recert next visit;  Check progress toward goals;  dry needling and manual as needed for neck pain        Problem List Patient Active Problem List   Diagnosis Date Noted  . Breast cancer of upper-outer quadrant of right female breast ([place]) 04/11/2015    Alvera Singh 10/18/2015, 2:30 PM  [facility] [address], [place], [place], [address] Phone: [phone]   Fax:  [phone]  Name: Jessica Roberts MRN: [id number] Date of Birth: [birth year]    Ruben Im, PT 10/18/2015 2:31 PM Phone: [phone] Fax: [phone]

## 2015-10-20 ENCOUNTER — Ambulatory Visit: Payer: Medicare Other

## 2015-10-20 DIAGNOSIS — M629 Disorder of muscle, unspecified: Secondary | ICD-10-CM

## 2015-10-20 DIAGNOSIS — M25511 Pain in right shoulder: Secondary | ICD-10-CM | POA: Diagnosis not present

## 2015-10-20 DIAGNOSIS — I890 Lymphedema, not elsewhere classified: Secondary | ICD-10-CM

## 2015-10-20 DIAGNOSIS — M25611 Stiffness of right shoulder, not elsewhere classified: Secondary | ICD-10-CM

## 2015-10-20 NOTE — Therapy (Signed)
[place], [place], [address] Phone: [phone]   Fax:  [phone]  Physical Therapy Treatment  Patient Details  Name: Jessica Roberts MRN: [id number] Date of Birth: [birth year] Referring Provider: Dr. Truitt Merle  Encounter Date: 10/20/2015      PT End of Session - 10/20/15 1109    Visit Number 4  Cancer Rehab   Number of Visits --  9 for cancer rehab   Date for PT Re-Evaluation 11/12/15  Cancer Rehab   PT Start Time 1024   PT Stop Time 1105   PT Time Calculation (min) 41 min   Activity Tolerance Patient tolerated treatment well   Behavior During Therapy [facility] for tasks assessed/performed      Past Medical History  Diagnosis Date  . Anxiety   . Hypertension   . Anxiety and depression   . Breast cancer of upper-outer quadrant of right female breast ([place]) 04/11/2015  . Dengue fever   . Malaria   . Typhoid   . Depression   . Complication of anesthesia     "had a very hard time waking up"  . PONV (postoperative nausea and vomiting)   . GERD (gastroesophageal reflux disease)   . Headache   . Head injury, closed, with concussion ([place]) 1986    in [place]  . Head injury 1995    concussion in [place], med evac to [place], ear damage with vertigo  . Head injury, acute, with loss of consciousness ([place]) 2000    [place]  . Fall     broken nose  . Broken nose   . Breast cancer ([place]) 04/07/15    right breast  . Allergy     Past Surgical History  Procedure Laterality Date  . Surgical procedure for endometriosis    . Tonsillectomy    . Colonoscopy w/ biopsies and polypectomy    . Nerve surgery      '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' during surgery for endometriosis  . Breast lumpectomy with needle localization and axillary sentinel lymph node bx Right 04/27/2015    Procedure: RIGHT BREAST LUMPECTOMY WITH  TWO NEEDLE LOCALIZATION AND TWO RIGHT AXILLARY SENTINEL LYMPH NODE BX;  Surgeon:  Fanny Skates, MD;  Location: [place];  Service: General;  Laterality: Right;    There were no vitals filed for this visit.  Visit Diagnosis:  Pain in joint, shoulder region, right  Disorder of fascia  Acquired lymphedema  Shoulder stiffness, right      Subjective Assessment - 10/20/15 1028    Subjective Starting to notice marginal improvements in my Rt shoulder ROM, chest tightness and even my neck feels better today bc I've been more aware lately of when I hike my shoulders up. Keeping them down has made a difference too.    Pertinent History Right breast invasive ductal carcinoma, [id number], stage IA, grade 1, ER positive, PR positive, HER-2 negative, (+) DCIS and ADH.  Lumpectomy in September with two separate incisions.  Surgeon released her and was to have given her a referral for PT, but she didn't get it.  Has had neck and shoulder problems since May 2016 and has had chiropractic and acupuncture, but then was diagnosed with cancer.  Started PT for that a month ago.  Saw Dr. Burr Medico , who found swelling at right axilla and has a little bit of pain from time to time at right inferior axilla at end of incision; leaning forward or mashing the right  breast causes pain.  Depression under control with meds.   Patient Stated Goals see about swelling and breast pain   Currently in Pain? Yes   Pain Score 2    Pain Location Neck   Pain Orientation Right   Pain Descriptors / Indicators Cramping   Pain Type Chronic pain   Pain Onset More than a month ago   Pain Frequency Intermittent   Aggravating Factors  It's started getting better a little!  When my sholders creep up it gets worse   Pain Relieving Factors keeping my sholders down            [facility] PT Assessment - 10/20/15 0001    AROM   Right Shoulder Flexion 146 Degrees   Right Shoulder ABduction 168 Degrees                     [facility] Adult PT Treatment/Exercise - 10/20/15 0001    Manual Therapy   Soft tissue  mobilization soft tissue mobilization at both breast and axillary incisions in supine; in left sidelying, to right scar area   Myofascial Release right UE myofascial pulling with partial range abduction (to tolerance); crosshands technique with distraction at right arm and concurrent stretch horizontally at chest; crosshands technique at chest in horizontal, vertical, and diagonal directions (especially right side);   Scapular Mobilization to right side in left sidelying, for protraction and depression   Passive ROM right shoulder into D2, abduction and flexion to tolerance; in left sidelying, right shoulder flexion to tolerance providing joint distraction intermittently throughout in both positions as pt reports this gives best stretch in shoulder and at incision.                  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[employer])   [place] #3   Title Cervical sidebending improved to 30 degrees needed or dressing   Status Achieved   PT SHORT TERM GOAL #4   Title Pain improved by 25% with home and work activities   Status Achieved           PT Long Term Goals - 10/18/15 1242    PT LONG TERM GOAL #1   Title The patient will be independent in safe, self progression of HEP for further improvements in ROM, strength and pain reduction  10/27/15   Time 8   Period Weeks   Status On-going   PT LONG TERM GOAL #2   Title The patient will have improved right shoulder flexion/elevation to 160 degrees needed for overhead reaching into cabinets and the white board at work   Time 8   Period Weeks   Status On-going   PT [place] #3   Title Patient  will have improved right glenohumeral, scapular and cervical strength to grossly 4/5 needed for lifting medium weight objects   Time 8   Period Weeks   Status On-going   PT LONG TERM GOAL #4   Title Patient will report a > 50% improvement in sleep   Time 8   Period Weeks  Status On-going   PT LONG TERM GOAL #5   Title Patient will report average pain with home and work ADLS 9/17    Time 8   Period Weeks   Status On-going   PT LONG TERM GOAL #6   Title FOTO functional outcome score improved from 55% limitation to 44% indicating improved function with less pain   Time 8   Period Weeks   Status On-going           Long Term Clinic Goals - 10/20/15 1118    CC Long Term Goal  #1   Title Patient will report at least 60% decrease in discomfort at right chest area with moving the right arm.  Pt reports noting marginal improvement in past few days with discomfort at chest and upper trap.   Status On-going   CC Long Term Goal  #2   Title Patient will report at least 60% perceived improvement in swelling at right upper flank.   Status On-going   CC Long Term Goal  #3   Title Patient will be independent in home exercise program and self-care for edema, including manual lymph drainage.   Status On-going            Plan - 10/20/15 1112    Clinical Impression Statement Pt still struggles with completely relaxing with PROM but with consistent verbal cuing did do better with this today than when this therapist saw her last week. HEr AROM measurements are much imporved today from her initial evaluation with Cancer Rehab and pt reports beginning to notice being more relaxed in her upper traps the past few days and didn;t feel the tightness today that is normally there.    Pt will benefit from skilled therapeutic intervention in order to improve on the following deficits Pain;Decreased range of motion;Decreased scar mobility;Increased edema   Rehab Potential Good   Clinical Impairments  Affecting Rehab Potential Breast CA with surgery (patient reports short term memory deficits as a result)   hx of 2 head injuries, hx of vertigo   PT Frequency 2x / week   PT Duration 4 weeks   PT Treatment/Interventions Manual techniques;Passive range of motion;Therapeutic exercise   PT Next Visit Plan For cancer rehab, continue myofascial work at right shoulder/chest area in supine and Lt S/L.    Consulted and Agree with Plan of Care Patient        Problem List Patient Active Problem List   Diagnosis Date Noted  . Breast cancer of upper-outer quadrant of right female breast ([place]) 04/11/2015    Otelia Limes, PTA 10/20/2015, 11:23 AM  [place], [place], [address] Phone: [phone]   Fax:  [phone]  Name: Jessica Roberts MRN: [id number] Date of Birth: 1946/03/25

## 2015-10-25 ENCOUNTER — Ambulatory Visit: Payer: Medicare Other

## 2015-10-25 ENCOUNTER — Ambulatory Visit: Payer: Medicare Other | Admitting: Physical Therapy

## 2015-10-25 DIAGNOSIS — M629 Disorder of muscle, unspecified: Secondary | ICD-10-CM

## 2015-10-25 DIAGNOSIS — M25511 Pain in right shoulder: Secondary | ICD-10-CM

## 2015-10-25 DIAGNOSIS — M62838 Other muscle spasm: Secondary | ICD-10-CM

## 2015-10-25 DIAGNOSIS — M436 Torticollis: Secondary | ICD-10-CM

## 2015-10-25 DIAGNOSIS — I890 Lymphedema, not elsewhere classified: Secondary | ICD-10-CM

## 2015-10-25 DIAGNOSIS — M6248 Contracture of muscle, other site: Secondary | ICD-10-CM | POA: Diagnosis not present

## 2015-10-25 DIAGNOSIS — M542 Cervicalgia: Secondary | ICD-10-CM

## 2015-10-25 NOTE — Therapy (Addendum)
[facility] [address], [place], [place], [address] Phone: [phone]   Fax:  [phone]  Physical Therapy Treatment/Recertification  Patient Details  Name: Jessica Roberts MRN: [id number] Date of Birth: 19-Nov-1945 Referring Provider: Dr. Truitt Merle  Encounter Date: 10/25/2015      PT End of Session - 10/25/15 1423    Visit Number 14   Number of Visits 20   Date for PT Re-Evaluation 12/06/15   Authorization Type --   PT Start Time 1237   PT Stop Time 1332   PT Time Calculation (min) 55 min   Activity Tolerance Patient tolerated treatment well      Past Medical History  Diagnosis Date  . Anxiety   . Hypertension   . Anxiety and depression   . Breast cancer of upper-outer quadrant of right female breast ([place]) 04/11/2015  . Dengue fever   . Malaria   . Typhoid   . Depression   . Complication of anesthesia     "had a very hard time waking up"  . PONV (postoperative nausea and vomiting)   . GERD (gastroesophageal reflux disease)   . Headache   . Head injury, closed, with concussion ([place]) 1986    in [place]  . Head injury 1995    concussion in [place], med evac to [place], ear damage with vertigo  . Head injury, acute, with loss of consciousness ([place]) 2000    [place] the country  . Fall     broken nose  . Broken nose   . Breast cancer ([place]) 04/07/15    right breast  . Allergy     Past Surgical History  Procedure Laterality Date  . Surgical procedure for endometriosis    . Tonsillectomy    . Colonoscopy w/ biopsies and polypectomy    . Nerve surgery      '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' during surgery for endometriosis  . Breast lumpectomy with needle localization and axillary sentinel lymph node bx Right 04/27/2015    Procedure: RIGHT BREAST LUMPECTOMY WITH  TWO NEEDLE LOCALIZATION AND TWO RIGHT AXILLARY SENTINEL LYMPH NODE BX;  Surgeon: Fanny Skates, MD;  Location: [place];  Service: General;   Laterality: Right;    There were no vitals filed for this visit.  Visit Diagnosis:  Pain in joint, shoulder region, right - Plan: PT plan of care cert/re-cert  Neck muscle spasm - Plan: PT plan of care cert/re-cert  Neck stiffness - Plan: PT plan of care cert/re-cert  Neck pain on right side - Plan: PT plan of care cert/re-cert      Subjective Assessment - 10/25/15 1239    Subjective Everything is greatly improved but had a crick in my neck Sunday but better today,  a slight hiccup. Mild discomfort only right now.  I don't ready for discharge yet.     Currently in Pain? Yes   Pain Score 2    Pain Orientation Right            [facility] PT Assessment - 10/25/15 0001    Observation/Other Assessments   Focus on Therapeutic Outcomes (FOTO)  35% limitation   AROM   Right Shoulder Flexion 153 Degrees   Right Shoulder ABduction 154 Degrees   Cervical Flexion 70   Cervical Extension 65   Cervical - Right Side Bend 45   Cervical - Left Side Bend 43   Cervical - Right Rotation 45   Cervical - Left Rotation 45  Strength   Right Shoulder Flexion 4+/5   Right Shoulder Extension 4+/5   Right Shoulder ABduction 4+/5   Right Shoulder Internal Rotation 4+/5   Right Shoulder External Rotation 4+/5   Right Shoulder Horizontal ABduction 4+/5       PT Treatment for neck: Manual therapy soft tissue mobilization to right upper trapezius, levator scap.  Manual cervical distraction 3x 45 sec; suboccipital release 3 min;  Contract relax right upper trap 3x 5 sec;  Contract/relax R/L cervical rotation 3x 5 sec;  Dry needling right cervical multifidi, upper trap, levator with improved muscle length noted.  IFC e-stim 8 ma 15 min to cervical region to decrease pain.  Hold moist heat secondary to undergoing MLD.            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[employer])   [place] #3   Title Cervical sidebending improved to 30 degrees needed or dressing   Status Achieved   PT SHORT TERM GOAL #4   Title Pain improved by 25% with home and work activities   Status Achieved           PT Long Term Goals - 10/25/15 [place] #1   Title The patient will be independent in safe, self progression of HEP for further improvements in ROM, strength and pain reduction  12/06/15   Time 8   Period Weeks   Status On-going   PT LONG TERM GOAL #2   Title The patient will have improved right shoulder flexion/elevation to 160 degrees needed for overhead reaching into cabinets and the white board at work   Time 8   Period Weeks   Status On-going   PT [place] #3   Title Patient will have improved right glenohumeral, scapular and cervical strength to grossly 4/5 needed for lifting medium weight objects   Time 8   Period Weeks   Status Achieved   PT LONG TERM GOAL #4   Title Patient will report a > 50% improvement in sleep   Time 8   Period Weeks   Status On-going   PT LONG TERM GOAL #5   Title Patient will report average pain with home and work ADLS [id number]    Time 8   Period Weeks   Status On-going   Additional Long Term Goals   Additional Long Term Goals Yes   PT LONG TERM GOAL #6   Title FOTO functional outcome score improved from 55% limitation to 44% indicating improved function with less pain   Time 8   Period Weeks   Status On-going   PT LONG TERM GOAL #7   Title Be able to go throughout a weekend or 2-3 days without medication use for pain.   Time 6   Period Weeks   Status New   PT LONG TERM GOAL #8   Title Cervical rotation to 50 degrees bilaterally for driving    Time 6   Period Weeks   Status New  [facility] Goals - 10/20/15 1118    CC Long Term Goal  #1   Title Patient will report at least 60% decrease in discomfort at right chest area with moving the right arm.  Pt reports noting marginal improvement in past few days with discomfort at chest and upper trap.   Status On-going   CC Long Term Goal  #2   Title Patient will report at least 60% perceived improvement in swelling at right upper flank.   Status On-going   CC Long Term Goal  #3   Title Patient will be independent in home exercise program and self-care for edema, including manual lymph drainage.   Status On-going            Plan - 10/25/15 1427    Clinical Impression Statement The patient reports she had a crick in her neck on Sunday that was very painful.  Tender points in right upper trap, cervical paraspinals on right.   Her cervical extension and flexion have significantly improved although sidebending and rotation are still limited secondary to decreased muscle lengths.  Her shoulder AROM has greatly improved as well.  She is undergoing PT for manual lymph drainage as well.  The patient would like to continue with her treatment her to address continued muscular pain in the cervical region as well as to improve her rotation ROM.  She feels dry needling, manual therapy, exercise and modalties have helped.    New goals added.     Pt will benefit from skilled therapeutic intervention in order to improve on the following deficits Pain;Decreased range of motion;Decreased scar mobility;Increased edema   Clinical Impairments Affecting Rehab Potential Breast CA with surgery (patient reports short term memory deficits as a result)   hx of 2 head injuries, hx of vertigo   PT Frequency 2x / week   PT Duration 8 weeks   PT Treatment/Interventions Manual techniques;Passive range of motion;Therapeutic exercise;Dry needling;Electrical Stimulation;Ultrasound;Patient/family education   PT Next Visit Plan dry  needling, manual therapy, ther ex to improve neck rotation and sidebending ROM; e-stim (will hold heat secondary to swelling)        Problem List Patient Active Problem List   Diagnosis Date Noted  . Breast cancer of upper-outer quadrant of right female breast ([place]) 04/11/2015   Ruben Im, PT 10/25/2015 3:47 PM Phone: [phone] Fax: [phone] Alvera Singh 10/25/2015, 3:43 PM  [facility] [address] [place], [place], [address] Phone: [phone]   Fax:  [phone]  Name: Jessica Roberts MRN: [id number] Date of Birth: [birth year]

## 2015-10-25 NOTE — Patient Instructions (Signed)
Cancer Rehab [phone] Self manual lymph drainage: Perform this sequence once a day.  Only give enough pressure no your skin to make the skin move. Circles above collar bones 5 times. Diaphragmatic - Supine   Inhale through nose making navel move out toward hands. Exhale through puckered lips, hands follow navel in. Repeat _5__ times. Rest _10__ seconds between repeats.   Copyright  VHI. All rights reserved.  Hug yourself.  Do circles at your neck just above your collarbones.  Repeat this 10 times.  Axilla - One at a Time   Using full weight of flat hand and fingers at center of uninvolved armpit, make _10__ in-place circles.  Then make "highway" to Lt armpit by stretching skin across chest.  Copyright  VHI. All rights reserved.  LEG: Inguinal Nodes Stimulation   With small finger side of hand against hip crease on involved side, gently perform circles at the crease. Repeat __10_ times.   Copyright  VHI. All rights reserved.  1) Axilla to Inguinal Nodes - Sweep   On involved side, sweep _4__ times from armpit along side of trunk to hip crease.  Now gently stretch skin from the involved side to the uninvolved side across the chest at the shoulder line.  Repeat that 4 times.  Draw an imaginary diagonal line from upper outer breast through the nipple area toward lower inner breast.  Direct fluid upward and inward from this line toward the pathway across your upper chest .  Do this in three rows to treat all of the upper inner breast tissue, and do each row 3-4x.      Direct fluid to treat all of lower outer breast tissue downward and outward toward      pathway that is aimed at the left groin.  Finish by doing the pathways as described above going from your involved armpit to the same side groin and going across your upper chest from the involved shoulder to the uninvolved shoulder.  Repeat the steps above where you do circles in your right groin and left  armpit. Copyright  VHI. All rights reserved.

## 2015-10-25 NOTE — Therapy (Signed)
[place], [place], [address] Phone: [phone]   Fax:  [phone]  Physical Therapy Treatment  Patient Details  Name: Jessica Roberts MRN: [id number] Date of Birth: [birth year] Referring Provider: Dr. Truitt Merle  Encounter Date: 10/25/2015      PT End of Session - 10/25/15 1105    Visit Number 5  Cancer Rehab   Number of Visits --  9 cancer rehab   Date for PT Re-Evaluation 11/12/15  Cancer Rehab   PT Start Time 1024  Pt arrived late   PT Stop Time 1104   PT Time Calculation (min) 40 min   Activity Tolerance Patient tolerated treatment well   Behavior During Therapy [facility] for tasks assessed/performed      Past Medical History  Diagnosis Date  . Anxiety   . Hypertension   . Anxiety and depression   . Breast cancer of upper-outer quadrant of right female breast ([place]) 04/11/2015  . Dengue fever   . Malaria   . Typhoid   . Depression   . Complication of anesthesia     "had a very hard time waking up"  . PONV (postoperative nausea and vomiting)   . GERD (gastroesophageal reflux disease)   . Headache   . Head injury, closed, with concussion ([place]) 1986    in [place]  . Head injury 1995    concussion in [place], med evac to [place], ear damage with vertigo  . Head injury, acute, with loss of consciousness ([place]) 2000    [place]  . Fall     broken nose  . Broken nose   . Breast cancer ([place]) 04/07/15    right breast  . Allergy     Past Surgical History  Procedure Laterality Date  . Surgical procedure for endometriosis    . Tonsillectomy    . Colonoscopy w/ biopsies and polypectomy    . Nerve surgery      '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' during surgery for endometriosis  . Breast lumpectomy with needle localization and axillary sentinel lymph node bx Right 04/27/2015    Procedure: RIGHT BREAST LUMPECTOMY WITH  TWO NEEDLE LOCALIZATION AND TWO RIGHT AXILLARY SENTINEL LYMPH NODE  BX;  Surgeon: Fanny Skates, MD;  Location: [place];  Service: General;  Laterality: Right;    There were no vitals filed for this visit.  Visit Diagnosis:  Acquired lymphedema  Disorder of fascia      Subjective Assessment - 10/25/15 1026    Subjective Overall been doing better, noticed a lump of fluid at my upper Rt breast Sunday and massaged it some and it went away!    Pertinent History Right breast invasive ductal carcinoma, pT1bN0M0, stage IA, grade 1, ER positive, PR positive, HER-2 negative, (+) DCIS and ADH.  Lumpectomy in September with two separate incisions.  Surgeon released her and was to have given her a referral for PT, but she didn't get it.  Has had neck and shoulder problems since May 2016 and has had chiropractic and acupuncture, but then was diagnosed with cancer.  Started PT for that a month ago.  Saw Dr. Burr Medico , who found swelling at right axilla and has a little bit of pain from time to time at right inferior axilla at end of incision; leaning forward or mashing the right breast causes pain.  Depression under control with meds.   Patient Stated Goals see about swelling and breast pain   Currently in Pain?  Yes   Pain Score 1   1.5   Pain Location Neck   Pain Orientation Right   Pain Descriptors / Indicators Other (Comment)  [place]   Pain Type Chronic pain   Pain Onset More than a month ago   Pain Frequency Intermittent   Aggravating Factors  just bothered me some yesterday   Pain Relieving Factors keeping my shoulders down                         [facility] Adult PT Treatment/Exercise - 10/25/15 0001    Manual Therapy   Manual Lymphatic Drainage (MLD) In Supine: Short neck, superficial and deep abdominals, Rt inguinal nodes and Rt axillo-inguinal anastomosis, Lt axilla nodes and anterior inter-axillary anastomosis and focused on lateral and inferior Rt breast redirecting along pathways, mostly lateral trunk.                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[employer])   [place] #3   Title Cervical sidebending improved to 30 degrees needed or dressing   Status Achieved   PT SHORT TERM GOAL #4   Title Pain improved by 25% with home and work activities   Status Achieved           PT Long Term Goals - 10/18/15 1242    PT LONG TERM GOAL #1   Title The patient will be independent in safe, self progression of HEP for further improvements in ROM, strength and pain reduction  10/27/15   Time 8   Period Weeks   Status On-going   PT LONG TERM GOAL #2   Title The patient will have improved right shoulder flexion/elevation to 160 degrees needed for overhead reaching into cabinets and the white board at work   Time 8   Period Weeks   Status On-going   PT [place] #3   Title Patient will have improved right glenohumeral, scapular and cervical strength to grossly 4/5 needed for lifting medium weight objects   Time 8   Period Weeks   Status On-going   PT LONG TERM GOAL #4   Title Patient will report a > 50% improvement in sleep   Time 8   Period Weeks   Status On-going   PT LONG TERM GOAL #5   Title Patient will report average pain with home and work ADLS 4/19    Time 8   Period Weeks   Status On-going   PT LONG TERM GOAL #6   Title FOTO functional outcome score improved from 55% limitation to 44%  indicating improved function with less pain   Time 8   Period Weeks   Status On-going           Long Term Clinic Goals - 10/20/15 1118  CC Long Term Goal  #1   Title Patient will report at least 60% decrease in discomfort at right chest area with moving the right arm.  Pt reports noting marginal improvement in past few days with discomfort at chest and upper trap.   Status On-going   CC Long Term Goal  #2   Title Patient will report at least 60% perceived improvement in swelling at right upper flank.   Status On-going   CC Long Term Goal  #3   Title Patient will be independent in home exercise program and self-care for edema, including manual lymph drainage.   Status On-going            Plan - 10/25/15 1111    Clinical Impression Statement Pt did well with flare up of fluid at superior breast over weekend but reports does want to feel more comfortable with performing manual lymph drainage so reviewed this further today and issued handout. Overall, her Rt breast near incision is palpably softer and pt reports tenderness is improving some.Pt did ask if she could switch all her care here just so she doesn't have to keep going to 2 different locations and therpaist assured her we couild treat her shoulder/upper trap here, we just don't do dry needling (though she could go to orhto) and our treatment of each would be shorter since it would be at one time and she decided to keep things as they are for now.    Pt will benefit from skilled therapeutic intervention in order to improve on the following deficits Pain;Decreased range of motion;Decreased scar mobility;Increased edema   Rehab Potential Good   Clinical Impairments Affecting Rehab Potential Breast CA with surgery (patient reports short term memory deficits as a result)   hx of 2 head injuries, hx of vertigo   PT Frequency 2x / week   PT Duration 4 weeks   PT Treatment/Interventions Manual techniques;Passive range of  motion;Therapeutic exercise   PT Next Visit Plan For cancer rehab, continue myofascial work at right shoulder/chest area in supine and Lt S/L. Review self manual lymph drainage with pt.    PT Home Exercise Plan See education section   Consulted and Agree with Plan of Care Patient        Problem List Patient Active Problem List   Diagnosis Date Noted  . Breast cancer of upper-outer quadrant of right female breast ([place]) 04/11/2015    Otelia Limes, PTA 10/25/2015, 12:15 PM  [place], [place], [address] Phone: [phone]   Fax:  [phone]  Name: Jessica Roberts MRN: [id number] Date of Birth: 01/08/1946

## 2015-10-27 ENCOUNTER — Ambulatory Visit: Payer: Medicare Other

## 2015-10-27 DIAGNOSIS — I890 Lymphedema, not elsewhere classified: Secondary | ICD-10-CM

## 2015-10-27 DIAGNOSIS — M629 Disorder of muscle, unspecified: Secondary | ICD-10-CM

## 2015-10-27 DIAGNOSIS — M25511 Pain in right shoulder: Secondary | ICD-10-CM | POA: Diagnosis not present

## 2015-10-27 NOTE — Therapy (Signed)
[place], [place], [address] Phone: [phone]   Fax:  [phone]  Physical Therapy Treatment  Patient Details  Name: Jessica Roberts MRN: [id number] Date of Birth: November 20, 1945 Referring Provider: Dr. Truitt Merle  Encounter Date: 10/27/2015      PT End of Session - 10/27/15 1209    Visit Number 5  [facility] rehab   Number of Visits 9  [facility] rehab   Date for PT Re-Evaluation 11/12/15   PT Start Time 1108   PT Stop Time 1201   PT Time Calculation (min) 53 min   Activity Tolerance Patient tolerated treatment well   Behavior During Therapy [facility] for tasks assessed/performed      Past Medical History  Diagnosis Date  . Anxiety   . Hypertension   . Anxiety and depression   . Breast cancer of upper-outer quadrant of right female breast ([place]) 04/11/2015  . Dengue fever   . Malaria   . Typhoid   . Depression   . Complication of anesthesia     "had a very hard time waking up"  . PONV (postoperative nausea and vomiting)   . GERD (gastroesophageal reflux disease)   . Headache   . Head injury, closed, with concussion ([place]) 1986    in [place]  . Head injury 1995    concussion in [place], med evac to [place], ear damage with vertigo  . Head injury, acute, with loss of consciousness ([place]) 2000    [place] of the country  . Fall     broken nose  . Broken nose   . Breast cancer ([place]) 04/07/15    right breast  . Allergy     Past Surgical History  Procedure Laterality Date  . Surgical procedure for endometriosis    . Tonsillectomy    . Colonoscopy w/ biopsies and polypectomy    . Nerve surgery      '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' during surgery for endometriosis  . Breast lumpectomy with needle localization and axillary sentinel lymph node bx Right 04/27/2015    Procedure: RIGHT BREAST LUMPECTOMY WITH  TWO NEEDLE LOCALIZATION AND TWO RIGHT AXILLARY SENTINEL LYMPH NODE BX;  Surgeon: Fanny Skates, MD;   Location: [place];  Service: General;  Laterality: Right;    There were no vitals filed for this visit.  Visit Diagnosis:  Acquired lymphedema  Disorder of fascia      Subjective Assessment - 10/27/15 1113    Subjective Feeling stressed out right now just because I overbooked myself with trying to do do too many things. I need to start saying no! My neck is feeling much better after my neck PT after I saw you. I'm trying to get more comfortable with doing my self manual lymph drainage.   Pertinent History Right breast invasive ductal carcinoma, pT1bN0M0, stage IA, grade 1, ER positive, PR positive, HER-2 negative, (+) DCIS and ADH.  Lumpectomy in September with two separate incisions.  Surgeon released her and was to have given her a referral for PT, but she didn't get it.  Has had neck and shoulder problems since May 2016 and has had chiropractic and acupuncture, but then was diagnosed with cancer.  Started PT for that a month ago.  Saw Dr. Burr Medico , who found swelling at right axilla and has a little bit of pain from time to time at right inferior axilla at end of incision; leaning forward or mashing the right breast causes pain.  Depression under  control with meds.   Patient Stated Goals see about swelling and breast pain   Currently in Pain? No/denies                         [facility] Adult PT Treatment/Exercise - 10/27/15 0001    Manual Therapy   Manual Lymphatic Drainage (MLD) In Supine: Short neck, superficial and deep abdominals, Rt inguinal nodes and Rt axillo-inguinal anastomosis, Lt axilla nodes and anterior inter-axillary anastomosis and focused on lateral and inferior Rt breast redirecting along pathways, mostly lateral trunk, then into Lt S/L for posterior inter-axillary and continued focus on Rt axillo-inguinal anastomosis.                  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[employer])   [place] #3   Title Cervical sidebending improved to 30 degrees needed or dressing   Status Achieved   PT SHORT TERM GOAL #4   Title Pain improved by 25% with home and work activities   Status Achieved           PT Long Term Goals - 10/25/15 [place] #1   Title The patient will be independent in safe, self progression of HEP for further improvements in ROM, strength and pain reduction  12/06/15   Time 8   Period Weeks   Status On-going   PT LONG TERM GOAL #2   Title The patient will have improved right shoulder flexion/elevation to 160 degrees needed for overhead reaching into cabinets and the white board at work   Time 8   Period Weeks   Status On-going   PT [place] #3   Title Patient will have improved right glenohumeral, scapular and cervical strength to grossly 4/5 needed for lifting medium weight objects   Time 8   Period Weeks   Status Achieved   PT LONG TERM GOAL #4   Title Patient will report a > 50% improvement in sleep   Time 8   Period Weeks   Status On-going   PT LONG TERM GOAL #5   Title Patient will report average pain with home and work ADLS 6/96    Time 8   Period Weeks   Status On-going   Additional Long Term Goals   Additional Long Term Goals Yes   PT LONG TERM GOAL #6   Title FOTO functional outcome score improved from 55% limitation to 44% indicating improved function with less pain   Time 8   Period Weeks   Status On-going   PT LONG TERM GOAL #7   Title Be able to go throughout a weekend or 2-3 days without medication use for pain.   Time 6   Period Weeks   Status New   PT LONG TERM GOAL #8   Title Cervical rotation to 50 degrees bilaterally for  driving    Time 6   Period Weeks   Status New           Long Term Clinic Goals - 10/20/15  [place] Term Goal  #1   Title Patient will report at least 60% decrease in discomfort at right chest area with moving the right arm.  Pt reports noting marginal improvement in past few days with discomfort at chest and upper trap.   Status On-going   CC Long Term Goal  #2   Title Patient will report at least 60% perceived improvement in swelling at right upper flank.   Status On-going   CC Long Term Goal  #3   Title Patient will be independent in home exercise program and self-care for edema, including manual lymph drainage.   Status On-going            Plan - 10/27/15 1210    Clinical Impression Statement Pt did well with review of self manual lymph drainage this morning and reports feeling more comfortable having heard me explain the sequencing a few times now. She also is interested getting a compression bra so sent a script to Dr. Burr Medico today.    Pt will benefit from skilled therapeutic intervention in order to improve on the following deficits Pain;Decreased range of motion;Decreased scar mobility;Increased edema   Rehab Potential Good   Clinical Impairments Affecting Rehab Potential Breast CA with surgery (patient reports short term memory deficits as a result)   hx of 2 head injuries, hx of vertigo   PT Frequency 2x / week   PT Duration 8 weeks   PT Treatment/Interventions Patient/family education;Manual lymph drainage;Passive range of motion   PT Next Visit Plan Cont to review with pt prn and cont self manual lymph drainage. Issue script for compression bra if returned signed.    Consulted and Agree with Plan of Care Patient        Problem List Patient Active Problem List   Diagnosis Date Noted  . Breast cancer of upper-outer quadrant of right female breast ([place]) 04/11/2015    Otelia Limes, PTA 10/27/2015, 12:22 PM  [place], [place], [address] Phone: [phone]   Fax:  [phone]  Name: Xinyi Batton MRN: [id number] Date of Birth: 03/01/1946

## 2015-10-31 ENCOUNTER — Ambulatory Visit: Payer: Medicare Other | Admitting: Physical Therapy

## 2015-10-31 DIAGNOSIS — C50911 Malignant neoplasm of unspecified site of right female breast: Secondary | ICD-10-CM | POA: Diagnosis not present

## 2015-10-31 DIAGNOSIS — I890 Lymphedema, not elsewhere classified: Secondary | ICD-10-CM

## 2015-10-31 DIAGNOSIS — M25511 Pain in right shoulder: Secondary | ICD-10-CM

## 2015-10-31 DIAGNOSIS — M629 Disorder of muscle, unspecified: Secondary | ICD-10-CM

## 2015-10-31 DIAGNOSIS — M25611 Stiffness of right shoulder, not elsewhere classified: Secondary | ICD-10-CM

## 2015-10-31 NOTE — Therapy (Signed)
[place], [place], [address] Phone: [phone]   Fax:  [phone]  Physical Therapy Treatment  Patient Details  Name: Jessica Roberts MRN: [id number] Date of Birth: Mar 07, 1946 Referring Provider: Dr. Truitt Merle  Encounter Date: 10/31/2015      PT End of Session - 10/31/15 1530    Visit Number 6  [facility] rehab   Number of Visits 9  [facility] rehab   Date for PT Re-Evaluation 11/12/15   Authorization Type G-code visit 19 on 10/31/15   PT Start Time 1438   PT Stop Time 1520   PT Time Calculation (min) 42 min   Activity Tolerance Patient tolerated treatment well   Behavior During Therapy [facility] for tasks assessed/performed      Past Medical History  Diagnosis Date  . Anxiety   . Hypertension   . Anxiety and depression   . Breast cancer of upper-outer quadrant of right female breast ([place]) 04/11/2015  . Dengue fever   . Malaria   . Typhoid   . Depression   . Complication of anesthesia     "had a very hard time waking up"  . PONV (postoperative nausea and vomiting)   . GERD (gastroesophageal reflux disease)   . Headache   . Head injury, closed, with concussion ([place]) 1986    in [place]  . Head injury 1995    concussion in [place], med evac to [place], ear damage with vertigo  . Head injury, acute, with loss of consciousness ([place]) 2000    [place] the country  . Fall     broken nose  . Broken nose   . Breast cancer ([place]) 04/07/15    right breast  . Allergy     Past Surgical History  Procedure Laterality Date  . Surgical procedure for endometriosis    . Tonsillectomy    . Colonoscopy w/ biopsies and polypectomy    . Nerve surgery      '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' during surgery for endometriosis  . Breast lumpectomy with needle localization and axillary sentinel lymph node bx Right 04/27/2015    Procedure: RIGHT BREAST LUMPECTOMY WITH  TWO NEEDLE LOCALIZATION AND TWO RIGHT AXILLARY SENTINEL LYMPH  NODE BX;  Surgeon: Fanny Skates, MD;  Location: [place];  Service: General;  Laterality: Right;    There were no vitals filed for this visit.  Visit Diagnosis:  Acquired lymphedema  Disorder of fascia  Pain in joint, shoulder region, right  Shoulder stiffness, right      Subjective Assessment - 10/31/15 1441    Subjective Except for my neck, things are getting better and better.   Patient is accompained by: Family member   Currently in Pain? Yes   Pain Score 5    Pain Location Neck   Pain Descriptors / Indicators Other (Comment)  [place]   Aggravating Factors  moving, not taking ibuprofen   Pain Relieving Factors ibuprofen                         [facility] Adult PT Treatment/Exercise - 10/31/15 0001    Self-Care   Self-Care Other Self-Care Comments   Other Self-Care Comments  Reviewed self-manual lymph drainage for right breast.   Manual Therapy   Myofascial Release right UE myofascial pulling   Manual Lymphatic Drainage (MLD) In supine, reviewed the following with patient, with pt. performing it herself but with verbal and tactile cues:  deep breathing, short  neck, left axilla and anterior interaxillary anastomosis, right groin and axillo-inguinal anastomosis, and right breast, directing towards pathways.  Pt. needed cueing for most aspects, including pace, hand placement, and sometimes directions.   Passive ROM to right shoulder with stretch to tolerance for ER, abduction, and flexion                PT Education - 10/31/15 1530    Education provided Yes   Education Details self manual lymph drainage   Person(s) Educated Patient   Methods Explanation;Demonstration;Tactile cues;Verbal cues   Comprehension Verbalized understanding;Returned demonstration          PT Short Term Goals - 10/25/15 1240    PT SHORT TERM GOAL #1   Title The patient will be able to participate in an initial HEP for ROM and early strengthening  (09/29/15)   Status Achieved    PT SHORT TERM GOAL #2   Title The patient will have improved shoulder flexion/elevation to 150 degrees 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[employer])   [place] #3   Title Cervical sidebending improved to 30 degrees needed or dressing   Status Achieved   PT SHORT TERM GOAL #4   Title Pain improved by 25% with home and work activities   Status Achieved           PT Long Term Goals - 10/25/15 [place] #1   Title The patient will be independent in safe, self progression of HEP for further improvements in ROM, strength and pain reduction  12/06/15   Time 8   Period Weeks   Status On-going   PT LONG TERM GOAL #2   Title The patient will have improved right shoulder flexion/elevation to 160 degrees needed for overhead reaching into cabinets and the white board at work   Time 8   Period Weeks   Status On-going   PT [place] #3   Title Patient will have improved right glenohumeral, scapular and cervical strength to grossly 4/5 needed for lifting medium weight objects   Time 8   Period Weeks   Status Achieved   PT LONG TERM GOAL #4   Title Patient will report a > 50% improvement in sleep   Time 8   Period Weeks   Status On-going   PT LONG TERM GOAL #5   Title Patient will report average pain with home and work ADLS [id number]    Time 8   Period Weeks   Status On-going   Additional Long Term Goals   Additional Long Term Goals Yes   PT LONG TERM GOAL #6   Title FOTO functional outcome score improved from 55% limitation to 44% indicating improved function with less pain   Time 8   Period Weeks   Status On-going   PT LONG TERM GOAL #7   Title Be able to go throughout a weekend or 2-3 days without medication use for pain.   Time 6   Period Weeks   Status New   PT LONG TERM GOAL #8   Title Cervical rotation to 50 degrees bilaterally for driving    Time 6   Period Weeks   Status New           Long Term  Clinic Goals - 10/20/15 1118    CC Long Term Goal  #1   Title Patient will report at least 60% decrease in  discomfort at right chest area with moving the right arm.  Pt reports noting marginal improvement in past few days with discomfort at chest and upper trap.   Status On-going   CC Long Term Goal  #2   Title Patient will report at least 60% perceived improvement in swelling at right upper flank.   Status On-going   CC Long Term Goal  #3   Title Patient will be independent in home exercise program and self-care for edema, including manual lymph drainage.   Status On-going            Plan - 10/31/15 1532    Clinical Impression Statement Patient needed review of self manual lylmph drainage to correct technique and improve confidence.  Still with tightness at right shoulder, but reports significant improvement overall, including with cording.   Pt will benefit from skilled therapeutic intervention in order to improve on the following deficits Pain;Decreased range of motion;Decreased scar mobility;Increased edema   Rehab Potential Good   PT Frequency 2x / week   PT Duration 8 weeks   PT Treatment/Interventions ADLs/Self Care Home Management;Patient/family education;Manual lymph drainage;Manual techniques;Passive range of motion   PT Next Visit Plan WILL NEED G-CODE NEXT VISIT.  Assess goals.  Review self-manual lymph drainage.  Issue script for compression bra if retruned signed.   PT Home Exercise Plan self-manual lymph drainage   Consulted and Agree with Plan of Care Patient        Problem List Patient Active Problem List   Diagnosis Date Noted  . Breast cancer of upper-outer quadrant of right female breast ([place]) 04/11/2015    SALISBURY,DONNA 10/31/2015, 3:37 PM  [place], [place], [address] Phone: [phone]   Fax:  [phone]  Name: Jessica Roberts MRN: [id number] Date of Birth:  05/22/1946    Serafina Royals, PT 10/31/2015 3:37 PM

## 2015-11-01 ENCOUNTER — Ambulatory Visit: Payer: Medicare Other | Admitting: Physical Therapy

## 2015-11-01 DIAGNOSIS — M436 Torticollis: Secondary | ICD-10-CM

## 2015-11-01 DIAGNOSIS — M62838 Other muscle spasm: Secondary | ICD-10-CM

## 2015-11-01 DIAGNOSIS — M542 Cervicalgia: Secondary | ICD-10-CM

## 2015-11-01 DIAGNOSIS — M6248 Contracture of muscle, other site: Secondary | ICD-10-CM | POA: Diagnosis not present

## 2015-11-01 NOTE — Therapy (Signed)
[facility] [address], [place], [place], [address] Phone: [phone]   Fax:  [phone]  Physical Therapy Treatment  Patient Details  Name: Jessica Roberts MRN: [id number] Date of Birth: 19-Sep-1945 Referring Provider: Dr. Truitt Merle  Encounter Date: 11/01/2015      PT End of Session - 11/01/15 1407    Visit Number 15   Number of Visits 20   Date for PT Re-Evaluation 12/06/15   Authorization Type Kx modifiers   PT Start Time 1248   PT Stop Time 1330   PT Time Calculation (min) 42 min   Activity Tolerance Patient tolerated treatment well      Past Medical History  Diagnosis Date  . Anxiety   . Hypertension   . Anxiety and depression   . Breast cancer of upper-outer quadrant of right female breast ([place]) 04/11/2015  . Dengue fever   . Malaria   . Typhoid   . Depression   . Complication of anesthesia     "had a very hard time waking up"  . PONV (postoperative nausea and vomiting)   . GERD (gastroesophageal reflux disease)   . Headache   . Head injury, closed, with concussion ([place]) 1986    in [place]  . Head injury 1995    concussion in [place], med evac to [place], ear damage with vertigo  . Head injury, acute, with loss of consciousness ([place]) 2000    [place] of the country  . Fall     broken nose  . Broken nose   . Breast cancer ([place]) 04/07/15    right breast  . Allergy     Past Surgical History  Procedure Laterality Date  . Surgical procedure for endometriosis    . Tonsillectomy    . Colonoscopy w/ biopsies and polypectomy    . Nerve surgery      '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' during surgery for endometriosis  . Breast lumpectomy with needle localization and axillary sentinel lymph node bx Right 04/27/2015    Procedure: RIGHT BREAST LUMPECTOMY WITH  TWO NEEDLE LOCALIZATION AND TWO RIGHT AXILLARY SENTINEL LYMPH NODE BX;  Surgeon: Fanny Skates, MD;  Location: [place];  Service: General;   Laterality: Right;    There were no vitals filed for this visit.  Visit Diagnosis:  Neck muscle spasm  Neck stiffness  Neck pain on right side      Subjective Assessment - 11/01/15 1246    Subjective Patient arrives 18 min late.  I'm a lot better than I was yesterday.  I think I spend too much lying around on Sunday and that is why I hurt so much on Mondays.  Hurts to turn to the right.     Currently in Pain? Yes   Pain Location Neck   Pain Orientation Right                         [facility] Adult PT Treatment/Exercise - 11/01/15 0001    Electrical Stimulation   Electrical Stimulation Location cervical    Electrical Stimulation Action pre-mod with dry needling    Electrical Stimulation Parameters level 1 10 min   Electrical Stimulation Goals Pain   Manual Therapy   Joint Mobilization C4-C7 PA grade 2 mobs 10x each level; rotation mobs C2-3 right and left grade 2   Myofascial Release right upper trap trap, cervical    Manual Traction 3x 25 sec   Muscle Energy Technique right  upper trap 6x 5 sec holds   McConnell upper trap inhibition white tape only          Trigger Point Dry Needling - 11/01/15 1406    Consent Given? Yes   Muscles Treated Upper Body --  right cervical multifidi   Upper Trapezius Response Twitch reponse elicited;Palpable increased muscle length   Levator Scapulae Response Twitch response elicited;Palpable increased muscle length              PT Education - 10/31/15 1530    Education provided Yes   Education Details self manual lymph drainage   Person(s) Educated Patient   Methods Explanation;Demonstration;Tactile cues;Verbal cues   Comprehension Verbalized understanding;Returned demonstration          PT Short Term Goals - 11/01/15 1556    PT SHORT TERM GOAL #1   Title The patient will be able to participate in an initial HEP for ROM and early strengthening  (09/29/15)   Status Achieved   PT SHORT TERM GOAL #2   Title The  patient will have improved shoulder flexion/elevation to 150 degrees 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[employer])   [place] #3   Title Cervical sidebending improved to 30 degrees needed or dressing   Status Achieved   PT SHORT TERM GOAL #4   Title Pain improved by 25% with home and work activities   Status Achieved           PT Long Term Goals - 11/01/15 1556    PT LONG TERM GOAL #1   Title The patient will be independent in safe, self progression of HEP for further improvements in ROM, strength and pain reduction  12/06/15   Time 8   Period Weeks   Status On-going   PT LONG TERM GOAL #2   Title The patient will have improved right shoulder flexion/elevation to 160 degrees needed for overhead reaching into cabinets and the white board at work   Time 8   Period Weeks   Status On-going   PT [place] #3   Title Patient will have improved right glenohumeral, scapular and cervical strength to grossly 4/5 needed for lifting medium weight objects   Status Achieved   PT LONG TERM GOAL #4   Title Patient will report a > 50% improvement in sleep   Time 8   Period Weeks   Status On-going   PT LONG TERM GOAL #5   Title Patient will report average pain with home and work ADLS [id number]    Time 8   Period Weeks   Status On-going   PT LONG TERM GOAL #6   Title FOTO functional outcome score improved from 55% limitation to 44% indicating improved function with less pain   Time 8   Period Weeks   Status On-going   PT LONG TERM GOAL #7   Title Be able to go throughout a weekend or 2-3 days without medication use for pain.   Time 6   Period Weeks   Status On-going   PT LONG TERM GOAL #8   Title Cervical rotation to 50 degrees bilaterally for driving    Time 6   Period Weeks   Status On-going           Long Term Clinic Goals - 10/20/15 1118    CC Long Term Goal  #1   Title Patient will report at least 60% decrease in discomfort  at right chest area with moving the right arm.  Pt reports noting marginal improvement in past few days with discomfort at chest and upper trap.   Status On-going   CC Long Term Goal  #2   Title Patient will report at least 60% perceived improvement in swelling at right upper flank.   Status On-going   CC Long Term Goal  #3   Title Patient will be independent in home exercise program and self-care for edema, including manual lymph drainage.   Status On-going            Plan - 11/01/15 1408    Clinical Impression Statement The patient reports continued pain in right neck limiting rotation and right upper trap region pain.  Reports she is always better after treatment but the pain generally comes back on Mondays (she wonders if it is b/c she is inactive on Sundays.)  Tender points throughout the length of upper trapezius muscle.  Initiated e-stim with dry needling.  Improved muscle length following treatment session.  Therapist closely monitoring response throughout alll interventions.     PT Next Visit Plan assess response to dry needling with e-stim;  continue with 1x/week treatment for neck pain;  KX;  recheck cervical rotation ROM (patient also receiving services at another location for manual lymph drainage0        Problem List Patient Active Problem List   Diagnosis Date Noted  . Breast cancer of upper-outer quadrant of right female breast ([place]) 04/11/2015    Alvera Singh 11/01/2015, 4:03 PM  [facility] [address], [place], [place], [address] Phone: [phone]   Fax:  [phone]  Name: Lynnelle Zesati MRN: [id number] Date of Birth: 1946/01/12    Ruben Im, PT 11/01/2015 4:03 PM Phone: [phone] Fax: [phone]

## 2015-11-02 ENCOUNTER — Ambulatory Visit: Payer: Medicare Other | Admitting: Physical Therapy

## 2015-11-02 DIAGNOSIS — M25511 Pain in right shoulder: Secondary | ICD-10-CM | POA: Diagnosis not present

## 2015-11-02 DIAGNOSIS — I890 Lymphedema, not elsewhere classified: Secondary | ICD-10-CM

## 2015-11-02 DIAGNOSIS — M629 Disorder of muscle, unspecified: Secondary | ICD-10-CM

## 2015-11-02 DIAGNOSIS — C50911 Malignant neoplasm of unspecified site of right female breast: Secondary | ICD-10-CM | POA: Diagnosis not present

## 2015-11-02 DIAGNOSIS — M25611 Stiffness of right shoulder, not elsewhere classified: Secondary | ICD-10-CM

## 2015-11-02 NOTE — Therapy (Signed)
[place], [place], [address] Phone: [phone]   Fax:  [phone]  Physical Therapy Treatment  Patient Details  Name: Jessica Roberts MRN: [id number] Date of Birth: 1946-05-30 Referring Provider: Dr. Truitt Merle  Encounter Date: 11/02/2015      PT End of Session - 11/02/15 1228    Visit Number 7  [facility]   Number of Visits 9  [facility]   Date for PT Re-Evaluation 11/12/15  [facility]   PT Start Time 1110   PT Stop Time 1159   PT Time Calculation (min) 49 min   Activity Tolerance Patient tolerated treatment well   Behavior During Therapy [facility] for tasks assessed/performed      Past Medical History  Diagnosis Date  . Anxiety   . Hypertension   . Anxiety and depression   . Breast cancer of upper-outer quadrant of right female breast ([place]) 04/11/2015  . Dengue fever   . Malaria   . Typhoid   . Depression   . Complication of anesthesia     "had a very hard time waking up"  . PONV (postoperative nausea and vomiting)   . GERD (gastroesophageal reflux disease)   . Headache   . Head injury, closed, with concussion ([place]) 1986    in [place]  . Head injury 1995    concussion in [place], med evac to [place], ear damage with vertigo  . Head injury, acute, with loss of consciousness ([place]) 2000    [place] the country  . Fall     broken nose  . Broken nose   . Breast cancer ([place]) 04/07/15    right breast  . Allergy     Past Surgical History  Procedure Laterality Date  . Surgical procedure for endometriosis    . Tonsillectomy    . Colonoscopy w/ biopsies and polypectomy    . Nerve surgery      '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' during surgery for endometriosis  . Breast lumpectomy with needle localization and axillary sentinel lymph node bx Right 04/27/2015    Procedure: RIGHT BREAST LUMPECTOMY WITH  TWO NEEDLE LOCALIZATION AND TWO RIGHT AXILLARY SENTINEL LYMPH NODE BX;  Surgeon: Fanny Skates,  MD;  Location: [place];  Service: General;  Laterality: Right;    There were no vitals filed for this visit.  Visit Diagnosis:  Acquired lymphedema  Disorder of fascia  Pain in joint, shoulder region, right  Shoulder stiffness, right      Subjective Assessment - 11/02/15 1112    Subjective Stacy taped my shoulder but I took the tape off this morning.  Did okay after last visit here.  Diid the massage at home but had one more question about that.  Got a new bra, and for two days has awakened in the morning without breast pain.  Bought two different styles.   Currently in Pain? Yes   Pain Score 1    Pain Location Neck   Pain Orientation Right   Pain Descriptors / Indicators Other (Comment)  "the tiniest thing"   Pain Relieving Factors new bra               LYMPHEDEMA/ONCOLOGY QUESTIONNAIRE - 11/02/15 1129    Right Upper Extremity Lymphedema   Other circumference measurement around chest at about an inch inferior to axilla, arms down, on full exhale:  96.5 cm.  [place] Adult PT Treatment/Exercise - 11/02/15 0001    Self-Care   Other Self-Care Comments  discussed compression bras and other garments; also reviewed manual lymph drainage components   Manual Therapy   Soft tissue mobilization stroking and stretch of cording at right axilla to patient tolerance   Myofascial Release right UE myofascial pulling; crosshands at right axilla   Passive ROM to right shoulder with stretch to tolerance for ER, abduction, and flexion          Trigger Point Dry Needling - 11/01/15 1406    Consent Given? 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[employer])   [place] #3   Title Cervical sidebending improved to 30 degrees needed or dressing   Status Achieved   PT SHORT TERM GOAL #4   Title Pain improved by 25% with home and work activities   Status Achieved           PT Long Term Goals - 11/01/15 1556    PT LONG TERM GOAL #1   Title The patient will be independent in safe, self progression of HEP for further improvements in ROM, strength and pain reduction  12/06/15   Time 8   Period Weeks   Status On-going   PT LONG TERM GOAL #2   Title The patient will have improved right shoulder flexion/elevation to 160 degrees needed for overhead reaching into cabinets and the white board at work   Time 8   Period Weeks   Status On-going   PT [place] #3   Title Patient will have improved right glenohumeral, scapular and cervical strength to grossly 4/5 needed for lifting medium weight objects   Status Achieved   PT LONG TERM GOAL #4   Title Patient will report a > 50% improvement in sleep   Time 8   Period Weeks   Status On-going   PT LONG TERM GOAL #5   Title Patient will report average pain with home and work ADLS 7/37    Time 8   Period Weeks   Status On-going   PT LONG TERM GOAL #6   Title FOTO functional outcome score improved from 55% limitation to 44% indicating improved function with less pain   Time 8   Period Weeks   Status On-going   PT LONG TERM GOAL #7   Title Be able to go throughout a weekend or 2-3 days without medication use for pain.   Time 6   Period Weeks   Status On-going   PT LONG TERM GOAL #8   Title Cervical rotation to 50 degrees bilaterally for driving    Time 6   Period Weeks   Status On-going            Long Term Clinic Goals - 11/02/15 1123  CC Long Term Goal  #1   Title Patient will report at least 60% decrease in discomfort at right chest area with moving the right arm.   Baseline 90% on 11/12/2015   Status Achieved   CC Long Term Goal  #2   Title Patient will report at least 60% perceived improvement in swelling at right upper flank.   Baseline 95% on 11/12/15   Status Achieved   CC Long Term Goal  #3   Title Patient will be independent in home exercise program and self-care for edema, including manual lymph drainage.   Baseline "almost independent" on 12-Nov-2015   Status Partially Met            Plan - Nov 12, 2015 1154    Clinical Impression Statement Pt. is very pleased with compression bras she got, feeling they reduced breast pain and also helped neck pain.  She fell asleep so unintentially wore one all night and woke up without breast pain and with minimal neck pain.  She had a couple of questions about manual lymph drainage technique and those were answered today.  She still has palpable cording and tightness at right axilla that will benefit from continued manual work; she will continue to stretch at home.   Pt will benefit from skilled therapeutic intervention in order to improve on the following deficits Pain;Decreased range of motion;Decreased scar mobility;Increased edema   Rehab Potential Good   Clinical Impairments Affecting Rehab Potential Breast CA with surgery (patient reports short term memory deficits as a result)   hx of 2 head injuries, hx of vertigo   PT Frequency 2x / week   PT Duration 4 weeks   PT Treatment/Interventions ADLs/Self Care Home Management;Patient/family education;Manual lymph drainage;Manual techniques;Passive range of motion   PT Next Visit Plan focus on myofascial release, soft tissue mobilization at right axilla and upper outer breast in [facility]   PT Home Exercise Plan self-manual lymph drainage, stretching   Consulted and Agree with Plan of Care  Patient          G-Codes - 11-12-15 1234    Functional Assessment Tool Used clinical judgement   Carrying, Moving and Handling Objects Current Status (F5379) At least 20 percent but less than 40 percent impaired, limited or restricted   Carrying, Moving and Handling Objects Goal Status (K3276) At least 20 percent but less than 40 percent impaired, limited or restricted      Problem List Patient Active Problem List   Diagnosis Date Noted  . Breast cancer of upper-outer quadrant of right female breast ([place]) 04/11/2015    Jessica Roberts Nov 12, 2015, 12:38 PM  [place], [place], [address] Phone: [phone]   Fax:  [phone]  Name: Jessica Roberts MRN: [id number] Date of Birth: 08-Oct-1945    Serafina Royals, PT 11-12-15 12:38 PM

## 2015-11-07 ENCOUNTER — Ambulatory Visit: Payer: Medicare Other | Admitting: Physical Therapy

## 2015-11-07 DIAGNOSIS — M629 Disorder of muscle, unspecified: Secondary | ICD-10-CM

## 2015-11-07 DIAGNOSIS — M25611 Stiffness of right shoulder, not elsewhere classified: Secondary | ICD-10-CM

## 2015-11-07 DIAGNOSIS — M25511 Pain in right shoulder: Secondary | ICD-10-CM

## 2015-11-07 NOTE — Therapy (Signed)
[place], [place], [address] Phone: [phone]   Fax:  [phone]  Physical Therapy Treatment  Patient Details  Name: Jessica Roberts MRN: [id number] Date of Birth: 03-Sep-1945 Referring Provider: Dr. Truitt Merle  Encounter Date: 11/07/2015      PT End of Session - 11/07/15 1159    Visit Number 8  [facility]   Number of Visits 9  [facility]   Date for PT Re-Evaluation 11/12/15  [facility]   PT Start Time 1108   PT Stop Time 1153   PT Time Calculation (min) 45 min   Activity Tolerance Patient tolerated treatment well   Behavior During Therapy [facility] for tasks assessed/performed      Past Medical History  Diagnosis Date  . Anxiety   . Hypertension   . Anxiety and depression   . Breast cancer of upper-outer quadrant of right female breast ([place]) 04/11/2015  . Dengue fever   . Malaria   . Typhoid   . Depression   . Complication of anesthesia     "had a very hard time waking up"  . PONV (postoperative nausea and vomiting)   . GERD (gastroesophageal reflux disease)   . Headache   . Head injury, closed, with concussion ([place]) 1986    in [place]  . Head injury 1995    concussion in [place], med evac to [place], ear damage with vertigo  . Head injury, acute, with loss of consciousness ([place]) 2000    out of the country  . Fall     broken nose  . Broken nose   . Breast cancer ([place]) 04/07/15    right breast  . Allergy     Past Surgical History  Procedure Laterality Date  . Surgical procedure for endometriosis    . Tonsillectomy    . Colonoscopy w/ biopsies and polypectomy    . Nerve surgery      '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' during surgery for endometriosis  . Breast lumpectomy with needle localization and axillary sentinel lymph node bx Right 04/27/2015    Procedure: RIGHT BREAST LUMPECTOMY WITH  TWO NEEDLE LOCALIZATION AND TWO RIGHT AXILLARY SENTINEL LYMPH NODE BX;  Surgeon: Fanny Skates,  MD;  Location: [facility];  Service: General;  Laterality: Right;    There were no vitals filed for this visit.  Visit Diagnosis:  Disorder of fascia  Pain in joint, shoulder region, right  Shoulder stiffness, right      Subjective Assessment - 11/07/15 1109    Subjective Not feeling as good as last week.  Yesterday had quick shooting pains in right breast that came and went without provocation.  Some incisional pain now.   Currently in Pain? Yes   Pain Score 1    Pain Location Axilla   Pain Orientation Right   Aggravating Factors  stretching   Pain Relieving Factors at rest                         [facility] Adult PT Treatment/Exercise - 11/07/15 0001    Shoulder Exercises: Sidelying   Other Sidelying Exercises in left sidelying, right D2 actively (and active-assistive) x 8   Manual Therapy   Soft tissue mobilization stroking and stretch of cording at right axilla to patient tolerance   Myofascial Release right UE myofascial pulling; crosshands at right axilla; also crosshands in diagonal across right chest to abdomen and horizontal across chest; in left sidelying, crosshands  at right flank   Scapular Mobilization to right side in left sidelying, for protraction and depression   Passive ROM to right shoulder with stretch to tolerance for ER, abduction, and flexion   Neural Stretch                    PT Short Term Goals - 11/01/15 1556    PT SHORT TERM GOAL #1   Title The patient will be able to participate in an initial HEP for ROM and early strengthening  (09/29/15)   Status Achieved   PT SHORT TERM GOAL #2   Title The patient will have improved shoulder flexion/elevation to 150 degrees 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[employer])   [place] #3   Title Cervical sidebending improved to 30 degrees needed or dressing   Status Achieved   PT SHORT TERM GOAL #4   Title Pain improved by 25% with home and work  activities   Status Achieved           PT Long Term Goals - 11/01/15 1556    PT LONG TERM GOAL #1   Title The patient will be independent in safe, self progression of HEP for further improvements in ROM, strength and pain reduction  12/06/15   Time 8   Period Weeks   Status On-going   PT LONG TERM GOAL #2   Title The patient will have improved right shoulder flexion/elevation to 160 degrees needed for overhead reaching into cabinets and the white board at work   Time 8   Period Weeks   Status On-going   PT [place] #3   Title Patient will have improved right glenohumeral, scapular and cervical strength to grossly 4/5 needed for lifting medium weight objects   Status Achieved   PT LONG TERM GOAL #4   Title Patient will report a > 50% improvement in sleep   Time 8   Period Weeks   Status On-going   PT LONG TERM GOAL #5   Title Patient will report average pain with home and work ADLS 4/09    Time 8   Period Weeks   Status On-going   PT LONG TERM GOAL #6   Title FOTO functional outcome score improved from 55% limitation to 44% indicating improved function with less pain   Time 8   Period Weeks   Status On-going   PT LONG TERM GOAL #7   Title Be able to go throughout a weekend or 2-3 days without medication use for pain.   Time 6   Period Weeks   Status On-going   PT LONG TERM GOAL #8   Title Cervical rotation to 50 degrees bilaterally for driving    Time 6   Period Weeks   Status On-going           Long Term Clinic Goals - 11/02/15 1123    CC Long Term Goal  #1   Title Patient will report at least 60% decrease in discomfort at right chest area with moving the right arm.   Baseline 90% on 11/02/15   Status Achieved   CC Long Term Goal  #2   Title Patient will report at least 60% perceived improvement in swelling at right upper flank.   Baseline 95% on 11/02/15   Status Achieved   CC Long Term Goal  #3   Title Patient will be independent in home exercise  program and self-care for edema, including manual lymph drainage.   Baseline "almost independent" on 11/02/15   Status Partially Met            Plan - 11/07/15 1151    Clinical Impression Statement Pt. reported at end of session that the area at upper breast that feels like has a knot felt less like a knot after it had been worked on.  She felt some pinching in upper outer breast during manual work, but this stopped when manual work was done.     Pt will benefit from skilled therapeutic intervention in order to improve on the following deficits Pain;Decreased range of motion;Decreased scar mobility;Increased edema   Rehab Potential Good   Clinical Impairments Affecting Rehab Potential Breast CA with surgery (patient reports short term memory deficits as a result)   hx of 2 head injuries, hx of vertigo   PT Frequency 2x / week   PT Duration 4 weeks   PT Treatment/Interventions Manual techniques;Passive range of motion;Scar mobilization;Therapeutic exercise   PT Next Visit Plan focus on myofascial release, soft tissue mobilization at right axilla and upper outer breast in [facility]   PT Home Exercise Plan self-manual lymph drainage, stretching   Consulted and Agree with Plan of Care Patient        Problem List Patient Active Problem List   Diagnosis Date Noted  . Breast cancer of upper-outer quadrant of right female breast ([place]) 04/11/2015    Jessica Roberts 11/07/2015, 12:02 PM  [place], [place], [address] Phone: [phone]   Fax:  [phone]  Name: Jessica Roberts MRN: [id number] Date of Birth: 03/30/1946    Serafina Royals, PT 11/07/2015 12:02 PM

## 2015-11-08 ENCOUNTER — Encounter: Payer: Medicare Other | Admitting: Physical Therapy

## 2015-11-09 ENCOUNTER — Ambulatory Visit: Payer: Medicare Other | Admitting: Physical Therapy

## 2015-11-09 DIAGNOSIS — M25511 Pain in right shoulder: Secondary | ICD-10-CM | POA: Diagnosis not present

## 2015-11-09 DIAGNOSIS — M25611 Stiffness of right shoulder, not elsewhere classified: Secondary | ICD-10-CM

## 2015-11-09 NOTE — Therapy (Signed)
[place], [place], [address] Phone: [phone]   Fax:  [phone]  Physical Therapy Treatment  Patient Details  Name: Jessica Roberts MRN: [id number] Date of Birth: 1945-10-08 Referring Provider: Dr. Truitt Merle  Encounter Date: 11/09/2015      PT End of Session - 11/09/15 1455    Visit Number 9  CA rehab   Number of Visits 13  CA rehab   Date for PT Re-Evaluation 12/12/15   Authorization Type Kx modifiers   PT Start Time 1116   PT Stop Time 1158   PT Time Calculation (min) 42 min   Activity Tolerance Patient tolerated treatment well   Behavior During Therapy [facility] for tasks assessed/performed      Past Medical History  Diagnosis Date  . Anxiety   . Hypertension   . Anxiety and depression   . Breast cancer of upper-outer quadrant of right female breast ([place]) 04/11/2015  . Dengue fever   . Malaria   . Typhoid   . Depression   . Complication of anesthesia     "had a very hard time waking up"  . PONV (postoperative nausea and vomiting)   . GERD (gastroesophageal reflux disease)   . Headache   . Head injury, closed, with concussion ([place]) 1986    in [place]  . Head injury 1995    concussion in [place], med evac to [place], ear damage with vertigo  . Head injury, acute, with loss of consciousness ([place]) 2000    out of the country  . Fall     broken nose  . Broken nose   . Breast cancer ([place]) 04/07/15    right breast  . Allergy     Past Surgical History  Procedure Laterality Date  . Surgical procedure for endometriosis    . Tonsillectomy    . Colonoscopy w/ biopsies and polypectomy    . Nerve surgery      '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' during surgery for endometriosis  . Breast lumpectomy with needle localization and axillary sentinel lymph node bx Right 04/27/2015    Procedure: RIGHT BREAST LUMPECTOMY WITH  TWO NEEDLE LOCALIZATION AND TWO RIGHT AXILLARY SENTINEL LYMPH NODE BX;   Surgeon: Fanny Skates, MD;  Location: [place];  Service: General;  Laterality: Right;    There were no vitals filed for this visit.  Visit Diagnosis:  Pain in joint, shoulder region, right - Plan: PT plan of care cert/re-cert  Shoulder stiffness, right - Plan: PT plan of care cert/re-cert      Subjective Assessment - 11/09/15 1119    Subjective Last time, felt sore everywhere I had touched her right after session and later that day, but the next morning, felt good.  Feeling good now. Says she doesn't notice her right breast anymore.  Is feeling concerned about the fact that she is going away for two weeks this weekend until Easter, and wants to be sure she is doing well through that.   Currently in Pain? Yes   Pain Score 2    Pain Location Other (Comment)  upper trap, medial aspect   Pain Orientation Right   Pain Descriptors / Indicators Sharp   Aggravating Factors  turning head to the right   Pain Relieving Factors sitting perfectly still                         [facility] Adult PT Treatment/Exercise - 11/09/15 0001  Manual Therapy   Soft tissue mobilization stroking and stretch of cording at right axilla to patient tolerance   Myofascial Release right UE myofascial pulling; crosshands at right axilla; also crosshands in diagonal across right chest to abdomen;   Passive ROM to right shoulder with stretch to tolerance for ER, abduction, and flexion                  PT Short Term Goals - 11/01/15 1556    PT SHORT TERM GOAL #1   Title The patient will be able to participate in an initial HEP for ROM and early strengthening  (09/29/15)   Status Achieved   PT SHORT TERM GOAL #2   Title The patient will have improved shoulder flexion/elevation to 150 degrees 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[employer])   [place] #3   Title Cervical sidebending improved to 30 degrees needed or dressing   Status Achieved   PT  SHORT TERM GOAL #4   Title Pain improved by 25% with home and work activities   Status Achieved           PT Long Term Goals - 11/01/15 1556    PT LONG TERM GOAL #1   Title The patient will be independent in safe, self progression of HEP for further improvements in ROM, strength and pain reduction  12/06/15   Time 8   Period Weeks   Status On-going   PT LONG TERM GOAL #2   Title The patient will have improved right shoulder flexion/elevation to 160 degrees needed for overhead reaching into cabinets and the white board at work   Time 8   Period Weeks   Status On-going   PT [place] #3   Title Patient will have improved right glenohumeral, scapular and cervical strength to grossly 4/5 needed for lifting medium weight objects   Status Achieved   PT LONG TERM GOAL #4   Title Patient will report a > 50% improvement in sleep   Time 8   Period Weeks   Status On-going   PT LONG TERM GOAL #5   Title Patient will report average pain with home and work ADLS 5/36    Time 8   Period Weeks   Status On-going   PT LONG TERM GOAL #6   Title FOTO functional outcome score improved from 55% limitation to 44% indicating improved function with less pain   Time 8   Period Weeks   Status On-going   PT LONG TERM GOAL #7   Title Be able to go throughout a weekend or 2-3 days without medication use for pain.   Time 6   Period Weeks   Status On-going   PT LONG TERM GOAL #8   Title Cervical rotation to 50 degrees bilaterally for driving    Time 6   Period Weeks   Status On-going           Long Term Clinic Goals - 11/09/15 1121    CC Long Term Goal  #1   Title Patient will report at least 60% decrease in discomfort at right chest area with moving the right arm.   Status Achieved   CC Long Term Goal  #2   Title Patient will report at least 60% perceived improvement in swelling at right upper flank.   Status Achieved   CC Long Term Goal  #3   Title Patient will be independent  in  home exercise program and self-care for edema, including manual lymph drainage.   Baseline "almost independent" on 11/02/15   Status Partially Met            Plan - 11/09/15 1455    Clinical Impression Statement Patient has made excellent improvement, in the range of 90% in terms of her improvement in discomfort at right chest.  She is nearly independent with self-care.  She will be on a two week international trip starting in a few days and both she and i feel it would be wise for her to return for at least one session but possibly more after that trip, to ensure that her gains have held through that.   Pt will benefit from skilled therapeutic intervention in order to improve on the following deficits Pain;Decreased range of motion;Decreased scar mobility;Increased edema   Rehab Potential Good   Clinical Impairments Affecting Rehab Potential Breast CA with surgery (patient reports short term memory deficits as a result)   hx of 2 head injuries, hx of vertigo   PT Frequency --  1-2x/week   PT Duration --  x 1-4 weeks prn   PT Treatment/Interventions Manual techniques;Passive range of motion;Scar mobilization   PT Next Visit Plan Reassess once she has returned from being out of the country for two weeks.   PT Home Exercise Plan self-manual lymph drainage, stretching   Consulted and Agree with Plan of Care Patient        Problem List Patient Active Problem List   Diagnosis Date Noted  . Breast cancer of upper-outer quadrant of right female breast ([place]) 04/11/2015    SALISBURY,DONNA 11/09/2015, 3:03 PM  [place], [place], [address] Phone: [phone]   Fax:  [phone]  Name: Jessica Roberts MRN: [id number] Date of Birth: 1945/08/26

## 2015-11-10 ENCOUNTER — Ambulatory Visit: Payer: Medicare Other | Admitting: Physical Therapy

## 2015-11-10 DIAGNOSIS — M542 Cervicalgia: Secondary | ICD-10-CM

## 2015-11-10 DIAGNOSIS — M25611 Stiffness of right shoulder, not elsewhere classified: Secondary | ICD-10-CM

## 2015-11-10 DIAGNOSIS — M436 Torticollis: Secondary | ICD-10-CM

## 2015-11-10 DIAGNOSIS — M62838 Other muscle spasm: Secondary | ICD-10-CM

## 2015-11-10 DIAGNOSIS — M25511 Pain in right shoulder: Secondary | ICD-10-CM

## 2015-11-10 DIAGNOSIS — M629 Disorder of muscle, unspecified: Secondary | ICD-10-CM

## 2015-11-10 DIAGNOSIS — M6248 Contracture of muscle, other site: Secondary | ICD-10-CM | POA: Diagnosis not present

## 2015-11-10 NOTE — Therapy (Signed)
[facility] [address], [place], [place], [address] Phone: [phone]   Fax:  [phone]  Physical Therapy Treatment  Patient Details  Name: Jessica Roberts MRN: [id number] Date of Birth: 11-Sep-1945 Referring Provider: Dr. Truitt Merle  Encounter Date: 11/10/2015      PT End of Session - 11/10/15 1510    Visit Number 60  ortho and CA visits combined (G code applied today)   Number of Visits 20   Date for PT Re-Evaluation 12/06/15   Authorization Type Kx modifiers;  needs G code at visit 29   PT Start Time 1445   PT Stop Time 1530   PT Time Calculation (min) 45 min   Activity Tolerance Patient tolerated treatment well      Past Medical History  Diagnosis Date  . Anxiety   . Hypertension   . Anxiety and depression   . Breast cancer of upper-outer quadrant of right female breast ([place]) 04/11/2015  . Dengue fever   . Malaria   . Typhoid   . Depression   . Complication of anesthesia     "had a very hard time waking up"  . PONV (postoperative nausea and vomiting)   . GERD (gastroesophageal reflux disease)   . Headache   . Head injury, closed, with concussion ([place]) 1986    in [place]  . Head injury 1995    concussion in [place], med evac to [place], ear damage with vertigo  . Head injury, acute, with loss of consciousness ([place]) 2000    out of the country  . Fall     broken nose  . Broken nose   . Breast cancer ([place]) 04/07/15    right breast  . Allergy     Past Surgical History  Procedure Laterality Date  . Surgical procedure for endometriosis    . Tonsillectomy    . Colonoscopy w/ biopsies and polypectomy    . Nerve surgery      '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' during surgery for endometriosis  . Breast lumpectomy with needle localization and axillary sentinel lymph node bx Right 04/27/2015    Procedure: RIGHT BREAST LUMPECTOMY WITH  TWO NEEDLE LOCALIZATION AND TWO RIGHT AXILLARY SENTINEL LYMPH NODE  BX;  Surgeon: Fanny Skates, MD;  Location: [facility];  Service: General;  Laterality: Right;    There were no vitals filed for this visit.  Visit Diagnosis:  Pain in joint, shoulder region, right  Shoulder stiffness, right  Disorder of fascia  Neck muscle spasm  Neck stiffness  Neck pain on right side      Subjective Assessment - 11/10/15 1449    Subjective Leaving for 2 week trip in 2 days.  Feeling "almost perfect."   Right neck pain right rotation and sidebending.  No upper trap pain.  I felt measureably better soon.     Currently in Pain? Yes   Pain Score 4    Pain Location Neck   Pain Orientation Right   Pain Type Chronic pain                         [facility] Adult PT Treatment/Exercise - 11/10/15 0001    Electrical Stimulation   Electrical Stimulation Location cervical    Electrical Stimulation Action pre-mod with dry needling   Electrical Stimulation Parameters Level 1.5 10 min   Electrical Stimulation Goals Pain   Manual Therapy   Joint Mobilization C4-C7 PA grade 2 mobs 10x  each level; rotation mobs C2-3 right and left grade 2   Myofascial Release right upper trap trap, cervical    Manual Traction 3x 25 sec   Muscle Energy Technique right upper trap 6x 5 sec holds          Trigger Point Dry Needling - 11/10/15 1508    Consent Given? 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[employer])   [place] #3   Title Cervical sidebending improved to 30 degrees needed or dressing   Status Achieved   PT SHORT TERM GOAL #4   Title Pain  improved by 25% with home and work activities   Status Achieved           PT Long Term Goals - 11/10/15 1658    PT LONG TERM GOAL #1   Title The patient will be independent in safe, self progression of HEP for further improvements in ROM, strength and pain reduction  12/06/15   Time 8   Period Weeks   Status Partially Met   PT LONG TERM GOAL #2   Title The patient will have improved right shoulder flexion/elevation to 160 degrees needed for overhead reaching into cabinets and the white board at work   Time 8   Period Weeks   Status On-going   PT [place] #3   Title Patient will have improved right glenohumeral, scapular and cervical strength to grossly 4/5 needed for lifting medium weight objects   Status Achieved   PT LONG TERM GOAL #4   Title Patient will report a > 50% improvement in sleep   Time 8   Period Weeks   Status On-going   PT LONG TERM GOAL #5   Title Patient will report average pain with home and work ADLS 3/70    Time 8   Period Weeks   Status On-going   PT LONG TERM GOAL #6   Title FOTO functional outcome score improved from 55% limitation to 44% indicating improved function with less pain   Time 8   Period Weeks   Status On-going   PT LONG TERM GOAL #7   Title Be able to go throughout a weekend or 2-3 days without medication use for pain.   Time 6   Period Weeks   Status On-going   PT LONG TERM GOAL #8   Title Cervical rotation to 50 degrees bilaterally for driving    Time 6   Period Weeks   Status On-going           Long Term Clinic Goals - 11/09/15 1121    CC Long Term Goal  #1   Title Patient will report at least 60% decrease in discomfort at right chest area with moving the right arm.   Status Achieved   CC Long Term Goal  #2   Title Patient will report at least 60% perceived improvement in swelling at right upper flank.   Status  Achieved   CC Long Term Goal  #3   Title Patient will be independent in home exercise program and  self-care for edema, including manual lymph drainage.   Baseline "almost independent" on 11/02/15   Status Partially Met            Plan - 2015-11-22 1648    Clinical Impression Statement The patient reports no more upper trap region pain but continued right neck pain with right rotation and right sidebending.  Tender points persist but improved muscle length noted post treatment session.  Therapist closely monitoring response to all treatment interventions.  The patient is going out of town for 2 weeks.  Will follow up that time for reassessment.  Progress has been slower secondary to multiple co-morbidities.     PT Next Visit Plan Reassess once she has returned from being out of the country for two weeks.; recheck cervical AROM and progress toward LTGs          G-Codes - 11/22/15 1659    Functional Assessment Tool Used clinical judgement   Functional Limitation Carrying, moving and handling objects   Carrying, Moving and Handling Objects Current Status [phone]) At least 20 percent but less than 40 percent impaired, limited or restricted   Carrying, Moving and Handling Objects Goal Status ([id number]) At least 20 percent but less than 40 percent impaired, limited or restricted      Problem List Patient Active Problem List   Diagnosis Date Noted  . Breast cancer of upper-outer quadrant of right female breast ([place]) 04/11/2015    Alvera Singh 2015-11-22, 5:01 PM  [facility] [address], [place], [place], [address] Phone: [phone]   Fax:  [phone]  Name: Jessica Roberts MRN: [id number] Date of Birth: 1946-08-07    Ruben Im, PT 11/22/2015 5:02 PM Phone: [phone] Fax: [phone]

## 2015-11-28 ENCOUNTER — Ambulatory Visit: Payer: Medicare Other | Attending: Hematology | Admitting: Physical Therapy

## 2015-11-28 DIAGNOSIS — M25611 Stiffness of right shoulder, not elsewhere classified: Secondary | ICD-10-CM | POA: Diagnosis not present

## 2015-11-28 DIAGNOSIS — M25612 Stiffness of left shoulder, not elsewhere classified: Secondary | ICD-10-CM | POA: Diagnosis not present

## 2015-11-28 DIAGNOSIS — M25511 Pain in right shoulder: Secondary | ICD-10-CM | POA: Diagnosis not present

## 2015-11-28 NOTE — Therapy (Signed)
[place], [place], [address] Phone: [phone]   Fax:  [phone]  Physical Therapy Treatment  Patient Details  Name: Jessica Roberts MRN: [id number] Date of Birth: 1946/01/07 Referring Provider: Dr. Truitt Merle  Encounter Date: 11/28/2015      PT End of Session - 11/28/15 1329    Visit Number 20   Number of Visits 28   Date for PT Re-Evaluation 12/27/15   Authorization Type Kx modifiers;  needs G code at visit 29; cancer rehab April cert done   PT Start Time 0945   PT Stop Time 1025   PT Time Calculation (min) 40 min   Activity Tolerance Patient tolerated treatment well;Patient limited by pain   Behavior During Therapy [facility] for tasks assessed/performed      Past Medical History  Diagnosis Date  . Anxiety   . Hypertension   . Anxiety and depression   . Breast cancer of upper-outer quadrant of right female breast ([place]) 04/11/2015  . Dengue fever   . Malaria   . Typhoid   . Depression   . Complication of anesthesia     "had a very hard time waking up"  . PONV (postoperative nausea and vomiting)   . GERD (gastroesophageal reflux disease)   . Headache   . Head injury, closed, with concussion ([place]) 1986    in [place]  . Head injury 1995    concussion in [place], med evac to [place], ear damage with vertigo  . Head injury, acute, with loss of consciousness ([place]) 2000    out of the country  . Fall     broken nose  . Broken nose   . Breast cancer ([place]) 04/07/15    right breast  . Allergy     Past Surgical History  Procedure Laterality Date  . Surgical procedure for endometriosis    . Tonsillectomy    . Colonoscopy w/ biopsies and polypectomy    . Nerve surgery      '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' during surgery for endometriosis  . Breast lumpectomy with needle localization and axillary sentinel lymph node bx Right 04/27/2015    Procedure: RIGHT BREAST LUMPECTOMY WITH  TWO NEEDLE  LOCALIZATION AND TWO RIGHT AXILLARY SENTINEL LYMPH NODE BX;  Surgeon: Fanny Skates, MD;  Location: [facility];  Service: General;  Laterality: Right;    There were no vitals filed for this visit.      Subjective Assessment - 11/28/15 0947    Subjective "the bad news is that toward the end of my vacation I stopped being diligent."  Having some discomfort at the end of the incision and upper chest; still a little also at inferior breast.  Just got back yesterday from [place].            [facility] PT Assessment - 11/28/15 0001    AROM   Right Shoulder Flexion 134 Degrees   Right Shoulder ABduction 117 Degrees                     [facility] Adult PT Treatment/Exercise - 11/28/15 0001    Shoulder Exercises: Supine   Other Supine Exercises supine over towel roll, have patient "melt" around towel, then manual pect minor stretches   Manual Therapy   Myofascial Release Rt. 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[employer])   [place] #3   Title Cervical sidebending improved to 30 degrees needed or dressing   Status Achieved   PT SHORT TERM GOAL #4   Title Pain improved by 25% with home and work activities   Status Achieved           PT Long Term Goals - 11/10/15 1658    PT LONG TERM GOAL #1   Title The patient will be independent in safe, self progression of HEP for further improvements in ROM, strength and pain reduction  12/06/15   Time 8   Period Weeks   Status Partially  Met   PT LONG TERM GOAL #2   Title The patient will have improved right shoulder flexion/elevation to 160 degrees needed for overhead reaching into cabinets and the white board at work   Time 8   Period Weeks   Status On-going   PT [place] #3   Title Patient will have improved right glenohumeral, scapular and cervical strength to grossly 4/5 needed for lifting medium weight objects   Status Achieved   PT LONG TERM GOAL #4   Title Patient will report a > 50% improvement in sleep   Time 8   Period Weeks   Status On-going   PT LONG TERM GOAL #5   Title Patient will report average pain with home and work ADLS 8/67    Time 8   Period Weeks   Status On-going   PT LONG TERM GOAL #6   Title FOTO functional outcome score improved from 55% limitation to 44% indicating improved function with less pain   Time 8   Period Weeks   Status On-going   PT LONG TERM GOAL #7   Title Be able to go throughout a weekend or 2-3 days without medication use for pain.   Time 6   Period Weeks   Status On-going   PT LONG TERM GOAL #8   Title Cervical rotation to 50 degrees bilaterally for driving    Time 6   Period Weeks   Status On-going           Long Term Clinic Goals - 11/28/15 1337    CC Long Term Goal  #1   Title Patient will report at least 60% decrease in discomfort at right chest area with moving the right arm.   Status Achieved   CC Long Term Goal  #2   Title Patient will report at least 60% perceived improvement in swelling at right upper flank.   Status Achieved   CC Long Term Goal  #3   Title Patient will be independent in home exercise program and self-care for edema, including manual lymph drainage.   Status Partially Met   CC Long Term Goal  #4   Title Right shoulder active flexion to at least 150 degrees.   Baseline Had been 153 earlier, at 134 on 11/28/15.   Time 4   Period Weeks   CC Long Term Goal  #5   Title Right shoulder active abduction  to at least 150 degrees.    Baseline Had been 154 earlier, now 117 on 11/28/15.   Time 4   Period Weeks   Status New            Plan - 11/28/15 1330    Clinical Impression Statement Patient returns after having been away two weeks in [place], reporting that she had been good about doing her exercises for part of the trip but not all of it.  She has lost right shoulder AROM (flexion 134 degrees compared to 153 before trip, abduction 117 compared to 154) and she has more discomfort with PROM.  She will need to continue therapy to ameliorate this.   Rehab Potential Good   Clinical Impairments Affecting Rehab Potential Breast CA with surgery (patient reports short term memory deficits as a result)   hx of 2 head injuries, hx of vertigo   PT Frequency 2x / week   PT Duration 4 weeks   PT Treatment/Interventions Manual techniques;Passive range of motion;Scar mobilization   PT Next Visit Plan Continue with P/AA/AROM of right shoulder.  Check cervical AROM.  Continue 2x/week.   PT Home Exercise Plan self-manual lymph drainage, stretching   Consulted and Agree with Plan of Care Patient      Patient will benefit from skilled therapeutic intervention in order to improve the following deficits and impairments:  Pain, Decreased range of motion, Decreased scar mobility, Increased edema  Visit Diagnosis: Pain in right shoulder - Plan: PT plan of care cert/re-cert  Stiffness of left shoulder, not elsewhere classified - Plan: PT plan of care cert/re-cert     Problem List Patient Active Problem List   Diagnosis Date Noted  . Breast cancer of upper-outer quadrant of right female breast ([place]) 04/11/2015    Alder Murri 11/28/2015, 1:42 PM  [place], [place], [address] Phone: [phone]   Fax:  [phone]  Name: Jessica Roberts MRN: [id number] Date of Birth: [birth year]    Serafina Royals, PT 11/28/2015 1:42 PM

## 2015-11-29 ENCOUNTER — Ambulatory Visit: Payer: Medicare Other | Attending: Family Medicine | Admitting: Physical Therapy

## 2015-11-29 DIAGNOSIS — R252 Cramp and spasm: Secondary | ICD-10-CM

## 2015-11-29 DIAGNOSIS — M542 Cervicalgia: Secondary | ICD-10-CM | POA: Diagnosis not present

## 2015-11-29 DIAGNOSIS — M6281 Muscle weakness (generalized): Secondary | ICD-10-CM | POA: Diagnosis not present

## 2015-11-29 NOTE — Therapy (Addendum)
[facility] [address], [place], [place], [address] Phone: [phone]   Fax:  [phone]  Physical Therapy Treatment/Recertification  Patient Details  Name: Jessica Roberts MRN: [id number] Date of Birth: 11/24/1945 Referring Provider: Dr. Truitt Merle  Encounter Date: 11/29/2015      PT End of Session - 11/29/15 1300    Visit Number 21   Number of Visits 28   Date for PT Re-Evaluation 12/27/15   Authorization Type Kx modifiers;  needs G code at visit 29; cancer rehab April cert done   PT Start Time 1234   PT Stop Time 1315   PT Time Calculation (min) 41 min   Activity Tolerance Patient tolerated treatment well      Past Medical History  Diagnosis Date  . Anxiety   . Hypertension   . Anxiety and depression   . Breast cancer of upper-outer quadrant of right female breast ([place]) 04/11/2015  . Dengue fever   . Malaria   . Typhoid   . Depression   . Complication of anesthesia     "had a very hard time waking up"  . PONV (postoperative nausea and vomiting)   . GERD (gastroesophageal reflux disease)   . Headache   . Head injury, closed, with concussion ([place]) 1986    in [place]  . Head injury 1995    concussion in [place], med evac to [place], ear damage with vertigo  . Head injury, acute, with loss of consciousness ([place]) 2000    [place]  . Fall     broken nose  . Broken nose   . Breast cancer ([place]) 04/07/15    right breast  . Allergy     Past Surgical History  Procedure Laterality Date  . Surgical procedure for endometriosis    . Tonsillectomy    . Colonoscopy w/ biopsies and polypectomy    . Nerve surgery      '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' during surgery for endometriosis  . Breast lumpectomy with needle localization and axillary sentinel lymph node bx Right 04/27/2015    Procedure: RIGHT BREAST LUMPECTOMY WITH  TWO NEEDLE LOCALIZATION AND TWO RIGHT AXILLARY SENTINEL LYMPH NODE BX;   Surgeon: Fanny Skates, MD;  Location: [place];  Service: General;  Laterality: Right;    There were no vitals filed for this visit.      Subjective Assessment - 11/29/15 1237    Subjective I"m jet lagged.  Patient states she is taking a job in [place] in a month.  I was on ibuprofen for the last week.  Continued right upper trap region pain (origin).  I have regressed in my breast CA treatment with pain and loss of motion.     Currently in Pain? 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[employer])   [place] #3   Title Cervical sidebending improved to 30 degrees  needed or dressing   Status Achieved   PT SHORT TERM GOAL #4   Title Pain improved by 25% with home and work activities   Status Achieved           PT Long Term Goals - 11/29/15 1304    PT LONG TERM GOAL #1   Title The patient will be independent in safe, self progression of HEP for further improvements in ROM, strength and pain reduction  12/06/15   Time 8   Period Weeks   Status Partially Met   PT LONG TERM GOAL #2   Title The patient will have improved right shoulder flexion/elevation to 160 degrees needed for overhead reaching into cabinets and the white board at work   Time 8   Period Weeks   Status On-going   PT [place] #3   Title Patient will have improved right glenohumeral, scapular and cervical strength to grossly 4/5 needed for lifting medium weight objects   Status Achieved   PT LONG TERM GOAL #4   Title Patient will report a > 50% improvement in sleep   Time 8   Period Weeks   Status On-going   PT LONG TERM GOAL #5   Title Patient will report average pain with home and work ADLS 5/94    Time 8   Period Weeks   Status On-going   PT LONG TERM GOAL #6   Title FOTO functional outcome score improved from 55% limitation to 44% indicating improved function with less pain   Time 8   Period Weeks   Status On-going   PT LONG TERM GOAL #7   Title Be able to go throughout a weekend or 2-3 days without medication use for pain.   Time 6   Period Weeks   Status On-going   PT LONG TERM GOAL #8   Title Cervical rotation to 50 degrees bilaterally for driving    Time 6   Period Weeks   Status On-going           Long Term Clinic Goals - 11/28/15 1337    CC Long Term Goal  #1   Title Patient will report at least 60% decrease in discomfort at right chest area with moving the right arm.   Status Achieved  CC Long Term Goal  #2   Title Patient will report at least 60% perceived improvement in swelling at right upper flank.   Status Achieved   CC Long Term  Goal  #3   Title Patient will be independent in home exercise program and self-care for edema, including manual lymph drainage.   Status Partially Met   CC Long Term Goal  #4   Title Right shoulder active flexion to at least 150 degrees.   Baseline Had been 153 earlier, at 134 on 11/28/15.   Time 4   Period Weeks   CC Long Term Goal  #5   Title Right shoulder active abduction to at least 150 degrees.   Baseline Had been 154 earlier, now 117 on 11/28/15.   Time 4   Period Weeks   Status New            Plan - 11/29/15 1301    Clinical Impression Statement The patient returns from [place] with an exacerbation of right upper trap region pain near insertion.  Improving with cervical  AROM in all planes.    Improved muscle length following interventions.  Therapist closely monitoring response throughout treatment session.     PT Next Visit Plan 1 more DN as needed;  manual therapy;  progress toward goals      Patient will benefit from skilled therapeutic intervention in order to improve the following deficits and impairments:     Visit Diagnosis: Cervicalgia - Plan: PT plan of care cert/re-cert  Muscle weakness (generalized) - Plan: PT plan of care cert/re-cert  Cramp and spasm - Plan: PT plan of care cert/re-cert     Problem List Patient Active Problem List   Diagnosis Date Noted  . Breast cancer of upper-outer quadrant of right female breast ([place]) 04/11/2015    Ruben Im, PT 11/29/2015 2:00 PM Phone: [phone] Fax: [phone]  Alvera Singh 11/29/2015, 1:59 PM  [facility] [address] [place], [place], [address] Phone: [phone]   Fax:  [phone]  Name: Jessica Roberts MRN: [id number] Date of Birth: Sep 11, 1945

## 2015-11-30 ENCOUNTER — Ambulatory Visit: Payer: Medicare Other | Admitting: Physical Therapy

## 2015-11-30 DIAGNOSIS — M25611 Stiffness of right shoulder, not elsewhere classified: Secondary | ICD-10-CM

## 2015-11-30 DIAGNOSIS — M25511 Pain in right shoulder: Secondary | ICD-10-CM | POA: Diagnosis not present

## 2015-11-30 NOTE — Therapy (Signed)
[place], [place], [address] Phone: [phone]   Fax:  [phone]  Physical Therapy Treatment  Patient Details  Name: Jessica Roberts MRN: [id number] Date of Birth: [birth year] Referring Provider: Dr. Truitt Merle  Encounter Date: 11/30/2015      PT End of Session - 11/30/15 1417    Visit Number 22   Number of Visits 28   Date for PT Re-Evaluation 12/27/15   Authorization Type Kx modifiers;  needs G code at visit 29; cancer rehab April cert done   PT Start Time 1305   PT Stop Time 1348   PT Time Calculation (min) 43 min   Activity Tolerance Patient limited by pain   Behavior During Therapy [facility] for tasks assessed/performed      Past Medical History  Diagnosis Date  . Anxiety   . Hypertension   . Anxiety and depression   . Breast cancer of upper-outer quadrant of right female breast ([place]) 04/11/2015  . Dengue fever   . Malaria   . Typhoid   . Depression   . Complication of anesthesia     "had a very hard time waking up"  . PONV (postoperative nausea and vomiting)   . GERD (gastroesophageal reflux disease)   . Headache   . Head injury, closed, with concussion ([place]) 1986    in [place]  . Head injury 1995    concussion in [place], med evac to [place], ear damage with vertigo  . Head injury, acute, with loss of consciousness ([place]) 2000    [place] the country  . Fall     broken nose  . Broken nose   . Breast cancer ([place]) 04/07/15    right breast  . Allergy     Past Surgical History  Procedure Laterality Date  . Surgical procedure for endometriosis    . Tonsillectomy    . Colonoscopy w/ biopsies and polypectomy    . Nerve surgery      '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' during surgery for endometriosis  . Breast lumpectomy with needle localization and axillary sentinel lymph node bx Right 04/27/2015    Procedure: RIGHT BREAST LUMPECTOMY WITH  TWO NEEDLE LOCALIZATION AND TWO RIGHT AXILLARY  SENTINEL LYMPH NODE BX;  Surgeon: Fanny Skates, MD;  Location: [place];  Service: General;  Laterality: Right;    There were no vitals filed for this visit.      Subjective Assessment - 11/30/15 1307    Subjective Nothing new.  It's a little sore but I still do my stretches.   Currently in Pain? Yes   Pain Score 5    Pain Location Neck   Pain Orientation Right   Aggravating Factors  moving a certain way, sometimes   Pain Relieving Factors sitting still   Pain Score 0   Pain Location Axilla   Pain Orientation Right                         [facility] Adult PT Treatment/Exercise - 11/30/15 0001    Manual Therapy   Manual therapy comments     Soft tissue mobilization at right axilla area of tightness, extending to right upper outer breast   Myofascial Release right UE myofascial pulling; crosshands at right axilla; also crosshands in diagonal across right chest to abdomen;  crosshands technique horizontal at upper chest   Passive ROM to right shoulder with stretch to tolerance for ER, abduction, and flexion  Trigger Point Dry Needling - 11/29/15 1259    Consent Given? 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[employer])   [place] #3   Title Cervical sidebending improved to 30 degrees needed or dressing   Status Achieved   PT SHORT TERM GOAL #4   Title Pain improved by 25% with home and work activities   Status Achieved           PT Long Term Goals - 11/29/15 1304    PT LONG TERM GOAL #1   Title The patient will be independent in  safe, self progression of HEP for further improvements in ROM, strength and pain reduction  12/06/15   Time 8   Period Weeks   Status Partially Met   PT LONG TERM GOAL #2   Title The patient will have improved right shoulder flexion/elevation to 160 degrees needed for overhead reaching into cabinets and the white board at work   Time 8   Period Weeks   Status On-going   PT [place] #3   Title Patient will have improved right glenohumeral, scapular and cervical strength to grossly 4/5 needed for lifting medium weight objects   Status Achieved   PT LONG TERM GOAL #4   Title Patient will report a > 50% improvement in sleep   Time 8   Period Weeks   Status On-going   PT LONG TERM GOAL #5   Title Patient will report average pain with home and work ADLS 2/87    Time 8   Period Weeks   Status On-going   PT LONG TERM GOAL #6   Title FOTO functional outcome score improved from 55% limitation to 44% indicating improved function with less pain   Time 8   Period Weeks   Status On-going   PT LONG TERM GOAL #7   Title Be able to go throughout a weekend or 2-3 days without medication use for pain.   Time 6   Period Weeks   Status On-going   PT LONG TERM GOAL #8   Title Cervical rotation to 50 degrees bilaterally for driving    Time 6   Period Weeks   Status On-going           Long Term Clinic Goals - 11/28/15 1337    CC Long Term Goal  #1   Title Patient will report at least 60% decrease in discomfort at right chest area with moving the right arm.   Status Achieved   CC Long Term Goal  #2   Title Patient will report at least 60% perceived improvement in swelling at right upper flank.   Status Achieved   CC Long Term Goal  #3   Title Patient will be independent in home exercise program and self-care for edema, including manual lymph drainage.   Status Partially Met   CC Long Term Goal  #4   Title Right shoulder active flexion  to at least 150 degrees.   Baseline Had been  153 earlier, at 134 on 11/28/15.   Time 4   Period Weeks   CC Long Term Goal  #5   Title Right shoulder active abduction to at least 150 degrees.   Baseline Had been 154 earlier, now 117 on 11/28/15.   Time 4   Period Weeks   Status New            Plan - 11/30/15 1417    Clinical Impression Statement Tolerated manual stretches today but limited by pain in all motions.     Rehab Potential Good   Clinical Impairments Affecting Rehab Potential Breast CA with surgery (patient reports short term memory deficits as a result)   hx of 2 head injuries, hx of vertigo   PT Frequency 2x / week   PT Duration 4 weeks   PT Treatment/Interventions Manual techniques;Passive range of motion;Scar mobilization   PT Next Visit Plan continue manual therapy for right shoulder and chest; check HEP and any need to progress   PT Home Exercise Plan self-manual lymph drainage, stretching   Consulted and Agree with Plan of Care Patient      Patient will benefit from skilled therapeutic intervention in order to improve the following deficits and impairments:  Pain, Decreased range of motion, Decreased scar mobility, Increased edema  Visit Diagnosis: Stiffness of right shoulder, not elsewhere classified  Pain in right shoulder     Problem List Patient Active Problem List   Diagnosis Date Noted  . Breast cancer of upper-outer quadrant of right female breast ([place]) 04/11/2015    Yasemin Rabon 11/30/2015, 2:21 PM  [place], [place], [address] Phone: [phone]   Fax:  [phone]  Name: Jessica Roberts MRN: [id number] Date of Birth: April 20, 1946    Serafina Royals, PT 11/30/2015 2:21 PM

## 2015-12-01 ENCOUNTER — Ambulatory Visit: Payer: Medicare Other | Admitting: Physical Therapy

## 2015-12-05 ENCOUNTER — Ambulatory Visit: Payer: Medicare Other | Admitting: Physical Therapy

## 2015-12-05 DIAGNOSIS — M25611 Stiffness of right shoulder, not elsewhere classified: Secondary | ICD-10-CM

## 2015-12-05 DIAGNOSIS — M25511 Pain in right shoulder: Secondary | ICD-10-CM | POA: Diagnosis not present

## 2015-12-05 NOTE — Therapy (Signed)
[place], [place], [address] Phone: [phone]   Fax:  [phone]  Physical Therapy Treatment  Patient Details  Name: Jessica Roberts MRN: [id number] Date of Birth: [birth year] Referring Provider: Dr. Truitt Merle  Encounter Date: 12/05/2015      PT End of Session - 12/05/15 1244    Visit Number 23   Number of Visits 28   Date for PT Re-Evaluation 12/27/15   Authorization Type Kx modifiers;  needs G code at visit 29; cancer rehab April cert done   PT Start Time 0854   PT Stop Time 0937   PT Time Calculation (min) 43 min   Activity Tolerance Patient tolerated treatment well   Behavior During Therapy [facility] for tasks assessed/performed      Past Medical History  Diagnosis Date  . Anxiety   . Hypertension   . Anxiety and depression   . Breast cancer of upper-outer quadrant of right female breast ([place]) 04/11/2015  . Dengue fever   . Malaria   . Typhoid   . Depression   . Complication of anesthesia     "had a very hard time waking up"  . PONV (postoperative nausea and vomiting)   . GERD (gastroesophageal reflux disease)   . Headache   . Head injury, closed, with concussion ([place]) 1986    in [place]  . Head injury 1995    concussion in [place], med evac to [place], ear damage with vertigo  . Head injury, acute, with loss of consciousness ([place]) 2000    [place]  . Fall     broken nose  . Broken nose   . Breast cancer ([place]) 04/07/15    right breast  . Allergy     Past Surgical History  Procedure Laterality Date  . Surgical procedure for endometriosis    . Tonsillectomy    . Colonoscopy w/ biopsies and polypectomy    . Nerve surgery      '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' during surgery for endometriosis  . Breast lumpectomy with needle localization and axillary sentinel lymph node bx Right 04/27/2015    Procedure: RIGHT BREAST LUMPECTOMY WITH  TWO NEEDLE LOCALIZATION AND TWO RIGHT  AXILLARY SENTINEL LYMPH NODE BX;  Surgeon: Fanny Skates, MD;  Location: [facility];  Service: General;  Laterality: Right;    There were no vitals filed for this visit.      Subjective Assessment - 12/05/15 0857    Subjective The day after I left here the right neck pain was gone for 48 hours.  I had to cancel my appointment here because of a dental appointment.  Then the neck pain came back but two ibuprofen knocked it out.  Saturday she was active in the garden and didn't feel good that night.  Sunday it hurt with moving a certain way but ibuprofen helped.  Today it's a knot there.  Right axilla feels like somebody's sticking a screw driver in it.  Felt a tinge of vertigo yesterday, twice.   Currently in Pain? Yes   Pain Score 5    Pain Location Axilla  at end of incision, just inferior to that, and upper outer breast   Pain Orientation Right   Pain Descriptors / Indicators Sharp   Aggravating Factors  raising arm up   Pain Relieving Factors resting arm  [facility] Adult PT Treatment/Exercise - 12/05/15 0001    Shoulder Exercises: Sidelying   Other Sidelying Exercises in left sidelying, right UE active D2 x 8 with guidance and instruction   Manual Therapy   Soft tissue mobilization at right axilla area of tightness, extending to right upper outer breast   Myofascial Release right UE myofascial pulling; crosshands at right axilla; also crosshands in diagonal across right chest to abdomen;  crosshands technique horizontal at upper chest; in left sidelying, right UE pulling with stretch at right axilla   Scapular Mobilization of right scapula in left sidelying with stretch into protraction and depresssion                  PT Short Term Goals - 11/29/15 1304    PT SHORT TERM GOAL #1   Title The patient will be able to participate in an initial HEP for ROM and early strengthening  (09/29/15)   Status Achieved   PT SHORT TERM GOAL #2   Title The  patient will have improved shoulder flexion/elevation to 150 degrees needed 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[employer])   Status Achieved   PT SHORT TERM GOAL #3   Title Cervical sidebending improved to 30 degrees needed or dressing   Status Achieved   PT SHORT TERM GOAL #4   Title Pain improved by 25% with home and work activities   Status Achieved           PT Long Term Goals - 11/29/15 1304    PT LONG TERM GOAL #1   Title The patient will be independent in safe, self progression of HEP for further improvements in ROM, strength and pain reduction  12/06/15   Time 8   Period Weeks   Status Partially Met   PT LONG TERM GOAL #2   Title The patient will have improved right shoulder flexion/elevation to 160 degrees needed for overhead reaching into cabinets and the white board at work   Time 8   Period Weeks   Status On-going   PT LONG TERM GOAL #3   Title Patient will have improved right glenohumeral, scapular and cervical strength to grossly 4/5 needed for lifting medium weight objects   Status Achieved   PT LONG TERM GOAL #4   Title Patient will report a > 50% improvement in sleep   Time 8   Period Weeks   Status On-going   PT LONG TERM GOAL #5   Title Patient will report average pain with home and work ADLS 3/10    Time 8   Period Weeks   Status On-going   PT LONG TERM GOAL #6   Title FOTO functional outcome score improved from 55% limitation to 44% indicating improved function with less pain   Time 8   Period Weeks   Status On-going   PT LONG TERM GOAL #7   Title Be able to go throughout a weekend or 2-3 days without medication use for pain.   Time 6   Period Weeks   Status On-going   PT LONG TERM GOAL #8   Title Cervical rotation to 50 degrees bilaterally for driving    Time 6   Period Weeks   Status On-going           Long Term Clinic Goals - 11/28/15 1337    CC Long Term Goal  #1   Title Patient will report at least 60% decrease in  discomfort at right chest  area with moving the right arm.   Status Achieved   CC Long Term Goal  #2   Title Patient will report at least 60% perceived improvement in swelling at right upper flank.   Status Achieved   CC Long Term Goal  #3   Title Patient will be independent in home exercise program and self-care for edema, including manual lymph drainage.   Status Partially Met   CC Long Term Goal  #4   Title Right shoulder active flexion to at least 150 degrees.   Baseline Had been 153 earlier, at 134 on 11/28/15.   Time 4   Period Weeks   CC Long Term Goal  #5   Title Right shoulder active abduction to at least 150 degrees.   Baseline Had been 154 earlier, now 117 on 11/28/15.   Time 4   Period Weeks   Status New            Plan - 12/05/15 1244    Clinical Impression Statement Patient reported feeling much better today after her session than at the start of her session.   Rehab Potential Good   Clinical Impairments Affecting Rehab Potential Breast CA with surgery (patient reports short term memory deficits as a result)   hx of 2 head injuries, hx of vertigo   PT Frequency 2x / week   PT Duration 4 weeks   PT Treatment/Interventions Manual techniques;Passive range of motion;Scar mobilization;Therapeutic exercise   PT Next Visit Plan continue manual therapy for right shoulder and chest; check HEP and any need to progress   Consulted and Agree with Plan of Care Patient      Patient will benefit from skilled therapeutic intervention in order to improve the following deficits and impairments:  Pain, Decreased range of motion, Decreased scar mobility, Increased edema  Visit Diagnosis: Stiffness of right shoulder, not elsewhere classified  Pain in right shoulder     Problem List Patient Active Problem List   Diagnosis Date Noted  . Breast cancer of upper-outer quadrant of right female breast ([place]) 04/11/2015    Haliyah Fryman 12/05/2015, 12:47 PM  [place], [place], [address] Phone: [phone]   Fax:  [phone]  Name: Jessica Roberts MRN: [id number] Date of Birth: 18-Apr-1946    Serafina Royals, PT 12/05/2015 12:47 PM

## 2015-12-06 ENCOUNTER — Telehealth: Payer: Self-pay | Admitting: *Deleted

## 2015-12-06 ENCOUNTER — Ambulatory Visit: Payer: Medicare Other | Admitting: Physical Therapy

## 2015-12-06 DIAGNOSIS — R252 Cramp and spasm: Secondary | ICD-10-CM

## 2015-12-06 DIAGNOSIS — M542 Cervicalgia: Secondary | ICD-10-CM | POA: Diagnosis not present

## 2015-12-06 DIAGNOSIS — M6281 Muscle weakness (generalized): Secondary | ICD-10-CM

## 2015-12-06 NOTE — Telephone Encounter (Signed)
Received vm call from pt stating that she is to see Dr Burr Medico in Aug but she will be leaving in 2 1/2 weeks & won't be back until Oct or Dec. Informed that I would leave Dr Burr Medico a note regarding this

## 2015-12-06 NOTE — Therapy (Signed)
[facility] [address], [place], [place], [address] Phone: [phone]   Fax:  [phone]  Physical Therapy Treatment  Patient Details  Name: Jessica Roberts MRN: [id number] Date of Birth: 1946-05-05 Referring Provider: Dr. Truitt Merle  Encounter Date: 12/06/2015      PT End of Session - 12/06/15 1320    Visit Number 24   Number of Visits 28   Date for PT Re-Evaluation 12/27/15   Authorization Type Kx modifiers;  needs G code at visit 29; cancer rehab April cert done   PT Start Time 1230   PT Stop Time 1325   PT Time Calculation (min) 55 min   Activity Tolerance Patient tolerated treatment well      Past Medical History  Diagnosis Date  . Anxiety   . Hypertension   . Anxiety and depression   . Breast cancer of upper-outer quadrant of right female breast ([place]) 04/11/2015  . Dengue fever   . Malaria   . Typhoid   . Depression   . Complication of anesthesia     "had a very hard time waking up"  . PONV (postoperative nausea and vomiting)   . GERD (gastroesophageal reflux disease)   . Headache   . Head injury, closed, with concussion ([place]) 1986    in [place]  . Head injury 1995    concussion in [place], med evac to [place], ear damage with vertigo  . Head injury, acute, with loss of consciousness ([place]) 2000    [place]  . Fall     broken nose  . Broken nose   . Breast cancer ([place]) 04/07/15    right breast  . Allergy     Past Surgical History  Procedure Laterality Date  . Surgical procedure for endometriosis    . Tonsillectomy    . Colonoscopy w/ biopsies and polypectomy    . Nerve surgery      '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' during surgery for endometriosis  . Breast lumpectomy with needle localization and axillary sentinel lymph node bx Right 04/27/2015    Procedure: RIGHT BREAST LUMPECTOMY WITH  TWO NEEDLE LOCALIZATION AND TWO RIGHT AXILLARY SENTINEL LYMPH NODE BX;  Surgeon: Fanny Skates, MD;  Location: [place];  Service: General;  Laterality: Right;    There were no vitals filed for this visit.      Subjective Assessment - 12/06/15 1232    Subjective Had to have a crown last week.  Last Wednesday, in cancer rehab had some axillary incision pain which helped right neck pain and that stayed gone for 36 hours.  Over the weekend, right neck pain and axillary pain returned.  Had cancer rehab treatment on Monday and that made the incision pain much better.  This morning right neck pain "crick in my neck"  but I did my ex's in bed  doing retractions and yellow band ex and within 15 min my neck felt better.  Did not need ibuprofen.  Tired right now but trying to handle loose ends prior to moving to [place].  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     Currently in Pain? Yes   Pain Score 5    Pain Location Neck   Pain Orientation Right   Pain Type Chronic pain                         [facility] Adult PT Treatment/Exercise - 12/06/15 0001  Programme researcher, broadcasting/film/video Parameters right only 7 ma   Electrical Stimulation Goals Pain   Manual Therapy   Manual Therapy --  suboccipital release 2 min   Myofascial Release right upper trap trap, cervical    Manual Traction 3x 25 sec   Muscle Energy Technique right upper trap 6x 5 sec holds                  PT Short Term Goals - 12/06/15 1324    PT SHORT TERM GOAL #1   Title The patient will be able to participate in an initial HEP for ROM and early strengthening  (09/29/15)   Status Achieved   PT SHORT TERM GOAL #2   Title The patient will have improved shoulder flexion/elevation to 150 degrees needed 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[employer])   Status Achieved   PT SHORT TERM GOAL #3   Title Cervical sidebending improved to 30 degrees needed or dressing   Status Achieved    PT SHORT TERM GOAL #4   Title Pain improved by 25% with home and work activities   Status Achieved           PT Long Term Goals - 12/06/15 1324    PT LONG TERM GOAL #1   Title The patient will be independent in safe, self progression of HEP for further improvements in ROM, strength and pain reduction  12/06/15   Time 8   Period Weeks   Status Partially Met   PT LONG TERM GOAL #2   Title The patient will have improved right shoulder flexion/elevation to 160 degrees needed for overhead reaching into cabinets and the white board at work   Time 8   Period Weeks   Status On-going   PT LONG TERM GOAL #3   Title Patient will have improved right glenohumeral, scapular and cervical strength to grossly 4/5 needed for lifting medium weight objects   Status Achieved   PT LONG TERM GOAL #4   Title Patient will report a > 50% improvement in sleep   Time 8   Period Weeks   Status On-going   PT LONG TERM GOAL #5   Title Patient will report average pain with home and work ADLS 3/10    Time 8   Period Weeks   PT LONG TERM GOAL #6   Title FOTO functional outcome score improved from 55% limitation to 44% indicating improved function with less pain   Time 8   Period Weeks   Status On-going   PT LONG TERM GOAL #7   Title Be able to go throughout a weekend or 2-3 days without medication use for pain.   Time 6   Period Weeks   Status On-going   PT LONG TERM GOAL #8   Title Cervical rotation to 50 degrees bilaterally for driving    Time 6   Period Weeks   Status On-going           Long Term Clinic Goals - 11/28/15 1337    CC Long Term Goal  #1   Title Patient will report at least 60% decrease in discomfort at right chest area with moving the right arm.   Status Achieved   CC Long Term Goal  #2   Title Patient will report at least 60% perceived improvement in swelling at right upper flank.   Status  Achieved   CC Long Term Goal  #3   Title Patient will be independent in home exercise  program and self-care for edema, including manual lymph drainage.   Status Partially Met   CC Long Term Goal  #4   Title Right shoulder active flexion to at least 150 degrees.   Baseline Had been 153 earlier, at 134 on 11/28/15.   Time 4   Period Weeks   CC Long Term Goal  #5   Title Right shoulder active abduction to at least 150 degrees.   Baseline Had been 154 earlier, now 117 on 11/28/15.   Time 4   Period Weeks   Status New            Plan - 12/06/15 1320    Clinical Impression Statement The patient states she realizes the impact of stress on her pain.  She reports she was able to to control the pain this morning with deep breathing and do her exercises relieving it within 15 min.  She did not have to take OTC medicine.  The patient has right upper trap (near origin) spasm but with improved soft tissue length with manual techniques.  Approaching discharge in 2 visits to indepedent/self management of neck pain.     PT Next Visit Plan manual therapy, dry needling neck as needed;  e-stim as needed;  review/progress HEP      Patient will benefit from skilled therapeutic intervention in order to improve the following deficits and impairments:     Visit Diagnosis: Cervicalgia  Muscle weakness (generalized)  Cramp and spasm     Problem List Patient Active Problem List   Diagnosis Date Noted  . Breast cancer of upper-outer quadrant of right female breast ([place]) 04/11/2015    Alvera Singh 12/06/2015, 5:00 PM  [facility] [address] [place], [place], [address] Phone: [phone]   Fax:  [phone]  Name: Livie Vanderhoof MRN: [id number] Date of Birth: 1945/11/29    Ruben Im, PT 12/06/2015 5:00 PM Phone: [phone] Fax: [phone]

## 2015-12-07 ENCOUNTER — Ambulatory Visit: Payer: Medicare Other | Admitting: Physical Therapy

## 2015-12-07 DIAGNOSIS — M25511 Pain in right shoulder: Secondary | ICD-10-CM | POA: Diagnosis not present

## 2015-12-07 DIAGNOSIS — M25611 Stiffness of right shoulder, not elsewhere classified: Secondary | ICD-10-CM

## 2015-12-07 NOTE — Therapy (Signed)
[place], [place], [address] Phone: [phone]   Fax:  [phone]  Physical Therapy Treatment  Patient Details  Name: Jessica Roberts MRN: [id number] Date of Birth: 1946/01/09 Referring Provider: Dr. Truitt Merle  Encounter Date: 12/07/2015      PT End of Session - 12/07/15 2040    Visit Number 25   Number of Visits 28   Date for PT Re-Evaluation 12/27/15   Authorization Type Kx modifiers;  needs G code at visit 29; cancer rehab April cert done   PT Start Time 1305   PT Stop Time 1349   PT Time Calculation (min) 44 min   Activity Tolerance Patient tolerated treatment well   Behavior During Therapy [facility] for tasks assessed/performed      Past Medical History  Diagnosis Date  . Anxiety   . Hypertension   . Anxiety and depression   . Breast cancer of upper-outer quadrant of right female breast ([place]) 04/11/2015  . Dengue fever   . Malaria   . Typhoid   . Depression   . Complication of anesthesia     "had a very hard time waking up"  . PONV (postoperative nausea and vomiting)   . GERD (gastroesophageal reflux disease)   . Headache   . Head injury, closed, with concussion ([place]) 1986    in [place]  . Head injury 1995    concussion in [place], med evac to [place], ear damage with vertigo  . Head injury, acute, with loss of consciousness ([place]) 2000    out of the country  . Fall     broken nose  . Broken nose   . Breast cancer ([place]) 04/07/15    right breast  . Allergy     Past Surgical History  Procedure Laterality Date  . Surgical procedure for endometriosis    . Tonsillectomy    . Colonoscopy w/ biopsies and polypectomy    . Nerve surgery      '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' during surgery for endometriosis  . Breast lumpectomy with needle localization and axillary sentinel lymph node bx Right 04/27/2015    Procedure: RIGHT BREAST LUMPECTOMY WITH  TWO NEEDLE LOCALIZATION AND TWO RIGHT  AXILLARY SENTINEL LYMPH NODE BX;  Surgeon: Fanny Skates, MD;  Location: [place];  Service: General;  Laterality: Right;    There were no vitals filed for this visit.      Subjective Assessment - 12/07/15 1308    Subjective Felt really good on Monday when she left here.  Yesterday and today, didn't need an ibuprofen in the morning; hasn't had one yet today, and yesterday didn't until the afternoon.   Currently in Pain? Yes   Pain Score 2    Pain Location Neck   Pain Orientation Right   Pain Relieving Factors stretching first thing in the morning   Pain Score 3   Pain Location Breast   Pain Orientation Right;Upper;Lateral   Pain Descriptors / Indicators Sore            [facility] PT Assessment - 12/07/15 0001    AROM   Right Shoulder Flexion 139 Degrees   Right Shoulder ABduction 145 Degrees                     [facility] Adult PT Treatment/Exercise - 12/07/15 0001    Manual Therapy   Soft tissue mobilization at right axilla area of tightness, extending to right upper outer breast  Myofascial Release at right upper outer breast, crosshands technique in the area of upper scar; right UE myofascial pulling and pulling with concurrent distraction in opposite direction at left abdomen;   Scapular Mobilization of right scapula in left sidelying with stretch into protraction and depresssion   Passive ROM in supine, pect minor stretches bilat.;                  PT Short Term Goals - 12/06/15 1324    PT SHORT TERM GOAL #1   Title The patient will be able to participate in an initial HEP for ROM and early strengthening  (09/29/15)   Status Achieved   PT SHORT TERM GOAL #2   Title The patient will have improved shoulder flexion/elevation to 150 degrees 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[employer])   [place] #3   Title Cervical sidebending improved to 30 degrees needed or dressing   Status Achieved   PT SHORT TERM GOAL #4    Title Pain improved by 25% with home and work activities   Status Achieved           PT Long Term Goals - 12/06/15 1324    PT LONG TERM GOAL #1   Title The patient will be independent in safe, self progression of HEP for further improvements in ROM, strength and pain reduction  12/06/15   Time 8   Period Weeks   Status Partially Met   PT LONG TERM GOAL #2   Title The patient will have improved right shoulder flexion/elevation to 160 degrees needed for overhead reaching into cabinets and the white board at work   Time 8   Period Weeks   Status On-going   PT [place] #3   Title Patient will have improved right glenohumeral, scapular and cervical strength to grossly 4/5 needed for lifting medium weight objects   Status Achieved   PT LONG TERM GOAL #4   Title Patient will report a > 50% improvement in sleep   Time 8   Period Weeks   Status On-going   PT LONG TERM GOAL #5   Title Patient will report average pain with home and work ADLS 2/92    Time 8   Period Weeks   PT LONG TERM GOAL #6   Title FOTO functional outcome score improved from 55% limitation to 44% indicating improved function with less pain   Time 8   Period Weeks   Status On-going   PT LONG TERM GOAL #7   Title Be able to go throughout a weekend or 2-3 days without medication use for pain.   Time 6   Period Weeks   Status On-going   PT LONG TERM GOAL #8   Title Cervical rotation to 50 degrees bilaterally for driving    Time 6   Period Weeks   Status On-going           Long Term Clinic Goals - 12/07/15 1312    CC Long Term Goal  #3   Title Patient will be independent in home exercise program and self-care for edema, including manual lymph drainage.   Status On-going   CC Long Term Goal  #4   Title Right shoulder active flexion to at least 150 degrees.   Baseline Had been 153 earlier, at 134 on 11/28/15; 139 on 12/07/15.   Status On-going   CC Long Term Goal  #5  Title Right shoulder active  abduction to at least 150 degrees.   Baseline Had been 154 earlier, now 117 on 11/28/15; 145 on 12/07/15   Status On-going            Plan - 12/07/15 2041    Clinical Impression Statement Right shoulder active flexion improved 5 degrees and abduction improved 28 degrees in the past week, but neither has gotten back to the best she achieved prior to her recent trip abroad.  She does already feel better as well in the past week.   Rehab Potential Good   Clinical Impairments Affecting Rehab Potential Breast CA with surgery (patient reports short term memory deficits as a result)   hx of 2 head injuries, hx of vertigo   PT Frequency 2x / week   PT Duration 4 weeks   PT Treatment/Interventions Manual techniques;Passive range of motion;Scar mobilization;Therapeutic exercise   PT Next Visit Plan continue manual therapy for right breast/axilla area; add to HEP as deemed appropriate   PT Home Exercise Plan self-manual lymph drainage, stretching   Consulted and Agree with Plan of Care Patient      Patient will benefit from skilled therapeutic intervention in order to improve the following deficits and impairments:  Pain, Decreased range of motion, Decreased scar mobility, Increased edema  Visit Diagnosis: Stiffness of right shoulder, not elsewhere classified  Pain in right shoulder     Problem List Patient Active Problem List   Diagnosis Date Noted  . Breast cancer of upper-outer quadrant of right female breast ([name]) 04/11/2015    SALISBURY,DONNA 12/07/2015, 8:46 PM  [place], [place], [address] Phone: [phone]   Fax:  [phone]  Name: Jessica Roberts MRN: [id number] Date of Birth: [birth year]    Serafina Royals, PT 12/07/2015 8:46 PM

## 2015-12-12 ENCOUNTER — Ambulatory Visit: Payer: Medicare Other | Attending: Hematology | Admitting: Physical Therapy

## 2015-12-12 DIAGNOSIS — M25511 Pain in right shoulder: Secondary | ICD-10-CM | POA: Diagnosis not present

## 2015-12-12 DIAGNOSIS — M25611 Stiffness of right shoulder, not elsewhere classified: Secondary | ICD-10-CM | POA: Diagnosis not present

## 2015-12-12 NOTE — Patient Instructions (Signed)
Supine With Rotation    Lie on back with one knee drawn toward chest. Slowly bring bent right leg across body to the left until stretch is felt in right chest area. Hold __30_ seconds.  Repeat __2_ times per session. Do __1-2_ sessions per day.  Copyright  VHI. All rights reserved.  OR DO THE FOLLOWING:  Supine With Rotation    Lie, back flat, legs bent, feet together, and arms outstretched to the sides. Rotate knees to left side. Hold _30__ seconds.  Repeat _2__ times per session. Do _1-2__ sessions per day.  If this doesn't stretch the right chest much, bring the knees up toward your chest before rotating. Copyright  VHI. All rights reserved.

## 2015-12-12 NOTE — Therapy (Signed)
[place], [place], [address] Phone: [phone]   Fax:  [phone]  Physical Therapy Treatment  Patient Details  Name: Jessica Roberts MRN: [id number] Date of Birth: 11/14/1945 Referring Provider: Dr. Truitt Merle  Encounter Date: 12/12/2015      PT End of Session - 12/12/15 2236    Visit Number 26   Number of Visits 28   Date for PT Re-Evaluation 12/27/15   Authorization Type Kx modifiers;  needs G code at visit 29; cancer rehab April cert done   PT Start Time 1525   PT Stop Time 1609   PT Time Calculation (min) 44 min   Activity Tolerance Patient tolerated treatment well;Patient limited by pain   Behavior During Therapy [facility] for tasks assessed/performed      Past Medical History  Diagnosis Date  . Anxiety   . Hypertension   . Anxiety and depression   . Breast cancer of upper-outer quadrant of right female breast ([place]) 04/11/2015  . Dengue fever   . Malaria   . Typhoid   . Depression   . Complication of anesthesia     "had a very hard time waking up"  . PONV (postoperative nausea and vomiting)   . GERD (gastroesophageal reflux disease)   . Headache   . Head injury, closed, with concussion ([place]) 1986    in [place]  . Head injury 1995    concussion in [place], med evac to [place], ear damage with vertigo  . Head injury, acute, with loss of consciousness ([place]) 2000    [place] the country  . Fall     broken nose  . Broken nose   . Breast cancer ([place]) 04/07/15    right breast  . Allergy     Past Surgical History  Procedure Laterality Date  . Surgical procedure for endometriosis    . Tonsillectomy    . Colonoscopy w/ biopsies and polypectomy    . Nerve surgery      '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' during surgery for endometriosis  . Breast lumpectomy with needle localization and axillary sentinel lymph node bx Right 04/27/2015    Procedure: RIGHT BREAST LUMPECTOMY WITH  TWO NEEDLE  LOCALIZATION AND TWO RIGHT AXILLARY SENTINEL LYMPH NODE BX;  Surgeon: Fanny Skates, MD;  Location: [facility];  Service: General;  Laterality: Right;    There were no vitals filed for this visit.      Subjective Assessment - 12/12/15 1525    Subjective The right upper outer breast only hurts when I mash it. It doesn't hurt with moving it, only with pressing on it.  Had discomfort at end of last session, but after three hours, it felt much better.   Currently in Pain? No/denies   Aggravating Factors  neck/upper trap hurts with moving her head the wrong way                         [facility] Adult PT Treatment/Exercise - 12/12/15 0001    Shoulder Exercises: Supine   Other Supine Exercises lower trunk rotation from hooklying, with arms outstretched and therapist assistance, 30 seconds x 2   Manual Therapy   Soft tissue mobilization at right axilla area of tightness, extending to right upper outer breast   Myofascial Release at right upper outer breast, crosshands technique in the area of upper scar; right UE myofascial pulling and pulling with concurrent distraction in opposite direction at left abdomen;  localized release at area of right axilla and upper scar                PT Education - 12/12/15 2239    Education provided Yes   Education Details added supine lower trunk rotation with arms outstretched for right chest stretch to HEP   Person(s) Educated Patient   Methods Explanation;Demonstration;Handout;Tactile cues   Comprehension Verbalized understanding;Returned demonstration          PT Short Term Goals - 12/06/15 1324    PT SHORT TERM GOAL #1   Title The patient will be able to participate in an initial HEP for ROM and early strengthening  (09/29/15)   Status Achieved   PT SHORT TERM GOAL #2   Title The patient will have improved shoulder flexion/elevation to 150 degrees 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[employer])   [place] #3   Title Cervical sidebending improved to 30 degrees needed or dressing   Status Achieved   PT SHORT TERM GOAL #4   Title Pain improved by 25% with home and work activities   Status Achieved           PT Long Term Goals - 12/06/15 1324    PT LONG TERM GOAL #1   Title The patient will be independent in safe, self progression of HEP for further improvements in ROM, strength and pain reduction  12/06/15   Time 8   Period Weeks   Status Partially Met   PT LONG TERM GOAL #2   Title The patient will have improved right shoulder flexion/elevation to 160 degrees needed for overhead reaching into cabinets and the white board at work   Time 8   Period Weeks   Status On-going   PT [place] #3   Title Patient will have improved right glenohumeral, scapular and cervical strength to grossly 4/5 needed for lifting medium weight objects   Status Achieved   PT LONG TERM GOAL #4   Title Patient will report a > 50% improvement in sleep   Time 8   Period Weeks   Status On-going   PT LONG TERM GOAL #5   Title Patient will report average pain with home and work ADLS 0/86    Time 8   Period Weeks   PT LONG TERM GOAL #6   Title FOTO functional outcome score improved from 55% limitation to 44% indicating improved function with less pain   Time 8   Period Weeks   Status On-going   PT LONG TERM GOAL #7   Title Be able to go throughout a weekend or 2-3 days without medication use for pain.   Time 6   Period Weeks   Status On-going   PT LONG TERM GOAL #8   Title Cervical rotation to 50 degrees bilaterally for driving    Time 6   Period Weeks   Status On-going           Long Term Clinic Goals - 12/07/15 1312    CC Long Term Goal  #3   Title Patient will be independent in home exercise program and self-care for edema, including manual lymph drainage.   Status On-going   CC Long Term Goal  #4   Title Right shoulder active flexion to at least 150 degrees.    Baseline Had been 153 earlier, at 134 on 11/28/15; 139 on 12/07/15.   Status On-going   CC  Long Term Goal  #5   Title Right shoulder active abduction to at least 150 degrees.   Baseline Had been 154 earlier, now 117 on 11/28/15; 145 on 12/07/15   Status On-going            Plan - 12/12/15 2237    Clinical Impression Statement Patient did well after last session, though she had discomfort lasting about 3 hours; following this, she felt better.     Rehab Potential Good   Clinical Impairments Affecting Rehab Potential Breast CA with surgery (patient reports short term memory deficits as a result)   hx of 2 head injuries, hx of vertigo   PT Frequency 2x / week   PT Duration 4 weeks   PT Treatment/Interventions Manual techniques;Passive range of motion;Scar mobilization;Therapeutic exercise   PT Next Visit Plan remeasure and recheck goals; continue manual therapy for right breast/axilla   PT Home Exercise Plan self-manual lymph drainage, stretching   Consulted and Agree with Plan of Care Patient      Patient will benefit from skilled therapeutic intervention in order to improve the following deficits and impairments:  Pain, Decreased range of motion, Decreased scar mobility, Increased edema  Visit Diagnosis: Stiffness of right shoulder, not elsewhere classified  Pain in right shoulder     Problem List Patient Active Problem List   Diagnosis Date Noted  . Breast cancer of upper-outer quadrant of right female breast ([name]) 04/11/2015    SALISBURY,DONNA 12/12/2015, 10:41 PM  [place], [place], [address] Phone: [phone]   Fax:  [phone]  Name: Jessica Roberts MRN: [id number] Date of Birth: 05/09/1946    Serafina Royals, PT 12/12/2015 10:41 PM

## 2015-12-14 ENCOUNTER — Ambulatory Visit: Payer: Medicare Other | Admitting: Physical Therapy

## 2015-12-14 DIAGNOSIS — M25611 Stiffness of right shoulder, not elsewhere classified: Secondary | ICD-10-CM

## 2015-12-14 DIAGNOSIS — M25511 Pain in right shoulder: Secondary | ICD-10-CM

## 2015-12-14 NOTE — Therapy (Signed)
Jessica Roberts, Jessica Roberts, [address] Phone: [phone]   Fax:  [phone]  Physical Therapy Treatment  Patient Details  Name: Jessica Roberts MRN: [id number] Date of Birth: 1946/06/27 Referring Provider: Dr. Truitt Merle  Encounter Date: 12/14/2015      PT End of Session - 12/14/15 1625    Visit Number 27   Number of Visits 28   Date for PT Re-Evaluation 12/27/15   Authorization Type Kx modifiers;  needs G code at visit 29; cancer rehab April cert done   PT Start Time 1524   PT Stop Time 1609   PT Time Calculation (min) 45 min   Activity Tolerance Patient tolerated treatment well;Patient limited by pain   Behavior During Therapy [facility] for tasks assessed/performed      Past Medical History  Diagnosis Date  . Anxiety   . Hypertension   . Anxiety and depression   . Breast cancer of upper-outer quadrant of right female breast ([place]) 04/11/2015  . Dengue fever   . Malaria   . Typhoid   . Depression   . Complication of anesthesia     "had a very hard time waking up"  . PONV (postoperative nausea and vomiting)   . GERD (gastroesophageal reflux disease)   . Headache   . Head injury, closed, with concussion ([place]) 1986    in [place]  . Head injury 1995    concussion in [place], med evac to [place], ear damage with vertigo  . Head injury, acute, with loss of consciousness ([place]) 2000    out of the country  . Fall     broken nose  . Broken nose   . Breast cancer ([place]) 04/07/15    right breast  . Allergy     Past Surgical History  Procedure Laterality Date  . Surgical procedure for endometriosis    . Tonsillectomy    . Colonoscopy w/ biopsies and polypectomy    . Nerve surgery      '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' during surgery for endometriosis  . Breast lumpectomy with needle localization and axillary sentinel lymph node bx Right 04/27/2015    Procedure: RIGHT BREAST LUMPECTOMY WITH  TWO NEEDLE  LOCALIZATION AND TWO RIGHT AXILLARY SENTINEL LYMPH NODE BX;  Surgeon: Fanny Skates, MD;  Location: [facility];  Service: General;  Laterality: Right;    There were no vitals filed for this visit.      Subjective Assessment - 12/14/15 1524    Subjective "The only thing that's bothering me is the right side of the neck and I have an appointment for that tomorrow."   Pain Score 3   Pain Location Breast   Pain Orientation Right;Upper;Lateral   Aggravating Factors  "just when I mash on it"            [facility] PT Assessment - 12/14/15 0001    AROM   Right Shoulder Flexion 144 Degrees   Right Shoulder ABduction 165 Degrees                     [facility] Adult PT Treatment/Exercise - 12/14/15 0001    Manual Therapy   Soft tissue mobilization at right axilla area of tightness, extending to right upper outer breast   Myofascial Release In supine, right UE myofascial pulling with movement into shoulder abduction; right UE myofascial pulling with concurrent distraction at inferior axilla; across right axilla and across axilla beyond scar at upper outer breast.  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[employer])   [place] #3   Title Cervical sidebending improved to 30 degrees needed or dressing   Status Achieved   PT SHORT TERM GOAL #4   Title Pain improved by 25% with home and work activities   Status Achieved           PT Long  Term Goals - 12/06/15 1324    PT LONG TERM GOAL #1   Title The patient will be independent in safe, self progression of HEP for further improvements in ROM, strength and pain reduction  12/06/15   Time 8   Period Weeks   Status Partially Met   PT LONG TERM GOAL #2   Title The patient will have improved right shoulder flexion/elevation to 160 degrees needed for overhead reaching into cabinets and the white board at work   Time 8   Period Weeks   Status On-going   PT [place] #3   Title Patient will have improved right glenohumeral, scapular and cervical strength to grossly 4/5 needed for lifting medium weight objects   Status Achieved   PT LONG TERM GOAL #4   Title Patient will report a > 50% improvement in sleep   Time 8   Period Weeks   Status On-going   PT LONG TERM GOAL #5   Title Patient will report average pain with home and work ADLS 9/50    Time 8   Period Weeks   PT LONG TERM GOAL #6   Title FOTO functional outcome score improved from 55% limitation to 44% indicating improved function with less pain   Time 8   Period Weeks   Status On-going   PT LONG TERM GOAL #7   Title Be able to go throughout a weekend or 2-3 days without medication use for pain.   Time 6   Period Weeks   Status On-going   PT LONG TERM GOAL #8   Title Cervical rotation to 50 degrees bilaterally for driving    Time 6   Period Weeks   Status On-going           Long Term Clinic Goals - 12/14/15 1528    CC Long Term Goal  #3   Title Patient will be independent in home exercise program and self-care for edema, including manual lymph drainage.   Status On-going   CC Long Term Goal  #4   Title Right shoulder active flexion to at least 150 degrees.   Status On-going   CC Long Term Goal  #5   Title Right shoulder active abduction to at least 150 degrees.   Status Achieved            Plan - 12/14/15 1627    Clinical Impression Statement Patient reports  continuing improvement, and  her AROM was better with measurement today.  She will have two more sessions to work toward breast cancer-related goals before she leaves the country for six months.   Rehab Potential Good   Clinical Impairments Affecting Rehab Potential Breast CA with surgery (patient reports short term memory deficits as a result)   hx of 2 head injuries, hx of vertigo   PT Frequency 2x / week   PT Duration 4 weeks   PT Treatment/Interventions Manual techniques;Passive range of motion;Scar mobilization;Therapeutic exercise   PT Next Visit Plan two more visits toward ROM and independence goals; go over self-manual lymph drainage   Consulted and Agree with Plan of Care Patient      Patient will benefit from skilled therapeutic intervention in order to improve the following deficits and impairments:  Pain, Decreased range of motion, Decreased scar mobility, Increased edema  Visit Diagnosis: Stiffness of right shoulder, not elsewhere classified  Pain in right shoulder     Problem List Patient Active Problem List   Diagnosis Date Noted  . Breast cancer of upper-outer quadrant of right female breast ([place]) 04/11/2015    Stanly Si 12/14/2015, 4:31 PM  [place], Jessica Roberts, Jessica Roberts Phone: [phone]   Fax:  [phone]  Name: Jessica Roberts MRN: [id number] Date of Birth: 1945-10-01    Serafina Royals, PT 12/14/2015 4:31 PM

## 2015-12-15 ENCOUNTER — Ambulatory Visit: Payer: Medicare Other | Attending: Family Medicine | Admitting: Physical Therapy

## 2015-12-15 DIAGNOSIS — M542 Cervicalgia: Secondary | ICD-10-CM | POA: Diagnosis not present

## 2015-12-15 DIAGNOSIS — R252 Cramp and spasm: Secondary | ICD-10-CM | POA: Diagnosis not present

## 2015-12-15 NOTE — Therapy (Addendum)
[facility] [address] [place], [place], [address] Phone: [phone]   Fax:  [phone]  Physical Therapy Treatment/Discharge Summary  Patient Details  Name: Jessica Roberts MRN: [id number] Date of Birth: 1946/06/09 Referring Provider: Dr. Truitt Merle  Encounter Date: 12/15/2015      PT End of Session - 12/15/15 1419    Visit Number 28   Number of Visits 28   Date for PT Re-Evaluation 12/27/15   Authorization Type Kx modifiers;  needs G code at visit 29; cancer rehab April cert done   PT Start Time 1400   PT Stop Time 1445   PT Time Calculation (min) 45 min   Activity Tolerance Patient tolerated treatment well      Past Medical History  Diagnosis Date  . Anxiety   . Hypertension   . Anxiety and depression   . Breast cancer of upper-outer quadrant of right female breast ([place]) 04/11/2015  . Dengue fever   . Malaria   . Typhoid   . Depression   . Complication of anesthesia     "had a very hard time waking up"  . PONV (postoperative nausea and vomiting)   . GERD (gastroesophageal reflux disease)   . Headache   . Head injury, closed, with concussion ([place]) 1986    in [place]  . Head injury 1995    concussion in [place], med evac to [place], ear damage with vertigo  . Head injury, acute, with loss of consciousness ([place]) 2000    out of the country  . Fall     broken nose  . Broken nose   . Breast cancer ([place]) 04/07/15    right breast  . Allergy     Past Surgical History  Procedure Laterality Date  . Surgical procedure for endometriosis    . Tonsillectomy    . Colonoscopy w/ biopsies and polypectomy    . Nerve surgery      '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' during surgery for endometriosis  . Breast lumpectomy with needle localization and axillary sentinel lymph node bx Right 04/27/2015    Procedure: RIGHT BREAST LUMPECTOMY WITH  TWO NEEDLE LOCALIZATION AND TWO RIGHT AXILLARY SENTINEL LYMPH NODE BX;   Surgeon: Fanny Skates, MD;  Location: [facility];  Service: General;  Laterality: Right;    There were no vitals filed for this visit.      Subjective Assessment - 12/15/15 1400    Subjective I did not take an ibuprofen today.  Yesterday they worked on my incision.  Right sided neck pain is good today.  Some discomfort with right sidebend combined with right rotation.     Currently in Pain? Yes   Pain Score 4   with movement   Pain Location Neck   Pain Orientation Right   Pain Type Chronic pain                         [facility] Adult PT Treatment/Exercise - 12/15/15 0001    Electrical Stimulation   Electrical Stimulation Location cervical    Electrical Stimulation Action pre-mod   Electrical Stimulation Parameters right only with needles 1.5 level 8 min   Electrical Stimulation Goals Pain   Manual Therapy   Manual Therapy Muscle Energy Technique;Other (comment);Taping   Soft tissue mobilization right upper trap, levator scap, suboccipitals   Myofascial Release suboccipital release 3 min   Manual Traction 3x 25 sec   Muscle Energy Technique right upper  trap 6x 5 sec holds   McConnell Kinesiotaping 3 strips to decrease pain/inhibit muscle          Trigger Point Dry Needling - 12/15/15 1724    Muscles Treated Upper Body --  right cervical multifidi   Upper Trapezius Response Twitch reponse elicited;Palpable increased muscle length   SubOccipitals Response Twitch response elicited;Palpable increased muscle length   Levator Scapulae Response Twitch response elicited;Palpable increased muscle length                PT Short Term Goals - 12/15/15 1850    PT SHORT TERM GOAL #1   Title The patient will be able to participate in an initial HEP for ROM and early strengthening  (09/29/15)   Status Achieved   PT SHORT TERM GOAL #2   Title The patient will have improved shoulder flexion/elevation to 150 degrees 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[employer])   Status Achieved   PT [place] #3   Title Cervical sidebending improved to 30 degrees needed or dressing   Status Achieved   PT SHORT TERM GOAL #4   Title Pain improved by 25% with home and work activities   Status Achieved           PT Long Term Goals - 12/15/15 1852    PT LONG TERM GOAL #1   Title The patient will be independent in safe, self progression of HEP for further improvements in ROM, strength and pain reduction  12/06/15   Time 8   Period Weeks   Status Partially Met   PT LONG TERM GOAL #2   Title The patient will have improved right shoulder flexion/elevation to 160 degrees needed for overhead reaching into cabinets and the white board at work   Time 8   Period Weeks   Status On-going   PT [place] #3   Title Patient will have improved right glenohumeral, scapular and cervical strength to grossly 4/5 needed for lifting medium weight objects   Status Achieved   PT LONG TERM GOAL #4   Title Patient will report a > 50% improvement in sleep   Time 8   Period Weeks   Status On-going   PT LONG TERM GOAL #5   Title Patient will report average pain with home and work ADLS 6/73    Time 8   Period Weeks   Status On-going   PT LONG TERM GOAL #6   Title FOTO functional outcome score improved from 55% limitation to 44% indicating improved function with less pain   Time 8   Period Weeks   Status On-going   PT LONG TERM GOAL #7   Title Be able to go throughout a weekend or 2-3 days without medication use for pain.   Time 6   Period Weeks   Status On-going   PT LONG TERM GOAL #8   Title Cervical rotation to 50 degrees bilaterally for driving    Time 6   Period Weeks   Status On-going               Plan - 12/15/15 1419    Clinical Impression Statement Patient reports decreasing medication usage for pain control.  No pain at rest, right neck pain with sidebend and rotation.  Improved soft tissue length post treatment session.  Post  treatment soreness typical following interventions.  Therapist closely monitoring response with all interventions.     PT Next Visit Plan  G code next visit;  discharge from neck PT next visit;  recheck cervical AROM; FOTO      Patient will benefit from skilled therapeutic intervention in order to improve the following deficits and impairments:     Visit Diagnosis: Cervicalgia  Cramp and spasm    PHYSICAL THERAPY DISCHARGE SUMMARY  Visits from Start of Care: 28  Current functional level related to goals / functional outcomes: The patient called to cancel her last appointment for treatment for neck pain.   She is moving out of the country.   Progress toward goals has been slow because of the chronicity of the problem.  Discharge from PT at patient request.   Remaining deficits: As above   Education / Equipment: HEP Plan: Patient agrees to discharge.  Patient goals were partially met. Patient is being discharged due to the patient's request.  ?????         G code lifting/carrying objects Goal CJ, Discharge CJ Problem List Patient Active Problem List   Diagnosis Date Noted  . Breast cancer of upper-outer quadrant of right female breast ([place]) 04/11/2015   Ruben Im, PT 12/15/2015 6:58 PM Phone: [phone] Fax: [phone] Alvera Singh 12/15/2015, 6:57 PM  [facility] [address] [place], [place], [address] Phone: [phone]   Fax:  [phone]  Name: Jessica Roberts MRN: [id number] Date of Birth: Jul 09, 1946

## 2015-12-19 ENCOUNTER — Ambulatory Visit: Payer: Medicare Other | Admitting: Physical Therapy

## 2015-12-19 DIAGNOSIS — M25611 Stiffness of right shoulder, not elsewhere classified: Secondary | ICD-10-CM | POA: Diagnosis not present

## 2015-12-19 DIAGNOSIS — M25511 Pain in right shoulder: Secondary | ICD-10-CM

## 2015-12-19 NOTE — Therapy (Signed)
[place], [place], [address] Phone: [phone]   Fax:  [phone]  Physical Therapy Treatment  Patient Details  Name: Jessica Roberts MRN: [id number] Date of Birth: September 27, 1945 Referring Provider: Dr. Truitt Merle  Encounter Date: 12/19/2015      PT End of Session - 12/19/15 1426    Visit Number 29   Number of Visits 28   Date for PT Re-Evaluation 12/27/15   Authorization Type Kx modifiers;  needs G code at visit 50;   PT Start Time 0940   PT Stop Time 1021   PT Time Calculation (min) 41 min   Activity Tolerance Patient tolerated treatment well   Behavior During Therapy [facility] for tasks assessed/performed      Past Medical History  Diagnosis Date  . Anxiety   . Hypertension   . Anxiety and depression   . Breast cancer of upper-outer quadrant of right female breast ([place]) 04/11/2015  . Dengue fever   . Malaria   . Typhoid   . Depression   . Complication of anesthesia     "had a very hard time waking up"  . PONV (postoperative nausea and vomiting)   . GERD (gastroesophageal reflux disease)   . Headache   . Head injury, closed, with concussion ([place]) 1986    in [place]  . Head injury 1995    concussion in [place], med evac to [place], ear damage with vertigo  . Head injury, acute, with loss of consciousness ([place]) 2000    out of the country  . Fall     broken nose  . Broken nose   . Breast cancer ([place]) 04/07/15    right breast  . Allergy     Past Surgical History  Procedure Laterality Date  . Surgical procedure for endometriosis    . Tonsillectomy    . Colonoscopy w/ biopsies and polypectomy    . Nerve surgery      '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' during surgery for endometriosis  . Breast lumpectomy with needle localization and axillary sentinel lymph node bx Right 04/27/2015    Procedure: RIGHT BREAST LUMPECTOMY WITH  TWO NEEDLE LOCALIZATION AND TWO RIGHT AXILLARY SENTINEL LYMPH NODE  BX;  Surgeon: Fanny Skates, MD;  Location: [facility];  Service: General;  Laterality: Right;    There were no vitals filed for this visit.      Subjective Assessment - 12/19/15 0941    Subjective "The part you're working on is good; the other part  is bothering me--there's a knot in there.  She needled it in exactly the right spot, but it didn't work."   Currently in Pain? Yes   Pain Score 5    Pain Location Neck   Pain Orientation Right   Pain Relieving Factors tiger balm and self-massage, ibuprofen   Pain Score 0   Aggravating Factors  can get pain at incision at upper outer breast if she raises arm up in to shoulder flexion with elbow flexion                         [facility] Adult PT Treatment/Exercise - 12/19/15 0001    Manual Therapy   Soft tissue mobilization at right axilla area of tightness, extending to right upper outer breast   Myofascial Release In supine, right UE myofascial pulling with movement into shoulder abduction; right UE myofascial pulling with concurrent distraction at inferior axilla; across right axilla and across  axilla beyond scar at upper outer breast.   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[employer])   [place] #3   Title Cervical sidebending improved to 30 degrees needed or dressing   Status Achieved   PT SHORT TERM GOAL #4   Title Pain improved by 25% with home and work activities   Status Achieved            PT Long Term Goals - 12/15/15 1852    PT LONG TERM GOAL #1   Title The patient will be independent in safe, self progression of HEP for further improvements in ROM, strength and pain reduction  12/06/15   Time 8   Period Weeks   Status Partially Met   PT LONG TERM GOAL #2   Title The patient will have improved right shoulder flexion/elevation to 160 degrees needed for overhead reaching into cabinets and the white board at work   Time 8   Period Weeks   Status On-going   PT [place] #3   Title Patient will have improved right glenohumeral, scapular and cervical strength to grossly 4/5 needed for lifting medium weight objects   Status Achieved   PT LONG TERM GOAL #4   Title Patient will report a > 50% improvement in sleep   Time 8   Period Weeks   Status On-going   PT LONG TERM GOAL #5   Title Patient will report average pain with home and work ADLS 2/44    Time 8   Period Weeks   Status On-going   PT LONG TERM GOAL #6   Title FOTO functional outcome score improved from 55% limitation to 44% indicating improved function with less pain   Time 8   Period Weeks   Status On-going   PT LONG TERM GOAL #7   Title Be able to go throughout a weekend or 2-3 days without medication use for pain.   Time 6   Period Weeks   Status On-going   PT LONG TERM GOAL #8   Title Cervical rotation to 50 degrees bilaterally for driving    Time 6   Period Weeks   Status On-going           Long Term Clinic Goals - 12/14/15 1528    CC Long Term Goal  #3   Title Patient will be independent in home exercise program and self-care for edema, including manual lymph drainage.   Status On-going   CC Long Term Goal  #4   Title Right shoulder active flexion to at least 150 degrees.   Status On-going   CC Long Term Goal  #5   Title Right shoulder active abduction to at least 150 degrees.   Status Achieved            Plan - 12/19/15 1427  Clinical Impression Statement  Continuing to improve with right upper outer breast discomfort, though it has not resolved completely.  She will have one more session before leaving for [place] for six or more months, so will be discharged later this week.   Rehab Potential Good   Clinical Impairments Affecting Rehab Potential Breast CA with surgery (patient reports short term memory deficits as a result)   hx of 2 head injuries, hx of vertigo   PT Frequency 2x / week   PT Duration 4 weeks   PT Treatment/Interventions Manual techniques;Passive range of motion;Scar mobilization;Therapeutic exercise   PT Next Visit Plan check goals, review manual lymph drainage by patient, discharge   PT Home Exercise Plan self-manual lymph drainage, stretching   Consulted and Agree with Plan of Care Patient      Patient will benefit from skilled therapeutic intervention in order to improve the following deficits and impairments:  Pain, Decreased range of motion, Decreased scar mobility, Increased edema  Visit Diagnosis: Stiffness of right shoulder, not elsewhere classified  Pain in right shoulder       G-Codes - 2016/01/16 1435    Functional Assessment Tool Used clinical judgement   Carrying, Moving and Handling Objects Current Status (C5885) At least 20 percent but less than 40 percent impaired, limited or restricted   Carrying, Moving and Handling Objects Goal Status (O2774) At least 20 percent but less than 40 percent impaired, limited or restricted      Problem List Patient Active Problem List   Diagnosis Date Noted  . Breast cancer of upper-outer quadrant of right female breast ([place]) 04/11/2015    Moataz Tavis 16-Jan-2016, 2:37 PM  [place], [place], [address] Phone: [phone]   Fax:  [phone]  Name: Lolita Faulds MRN: [id number] Date of Birth: 07/26/1946    Serafina Royals, PT Jan 16, 2016 2:37 PM

## 2015-12-20 ENCOUNTER — Encounter: Payer: Medicare Other | Admitting: Physical Therapy

## 2015-12-21 ENCOUNTER — Ambulatory Visit: Payer: Medicare Other | Admitting: Physical Therapy

## 2015-12-21 DIAGNOSIS — M25611 Stiffness of right shoulder, not elsewhere classified: Secondary | ICD-10-CM | POA: Diagnosis not present

## 2015-12-21 DIAGNOSIS — M25511 Pain in right shoulder: Secondary | ICD-10-CM

## 2015-12-21 NOTE — Therapy (Signed)
[place], [place], [address] Phone: [phone]   Fax:  [phone]  Physical Therapy Treatment  Patient Details  Name: Jessica Roberts MRN: [id number] Date of Birth: 05-Oct-1945 Referring Provider: Dr. Truitt Merle  Encounter Date: 12/21/2015      PT End of Session - 12/21/15 2055    Visit Number 30   PT Start Time 1470   PT Stop Time 1351   PT Time Calculation (min) 46 min   Activity Tolerance Patient tolerated treatment well   Behavior During Therapy [facility] for tasks assessed/performed      Past Medical History  Diagnosis Date  . Anxiety   . Hypertension   . Anxiety and depression   . Breast cancer of upper-outer quadrant of right female breast ([facility]) 04/11/2015  . Dengue fever   . Malaria   . Typhoid   . Depression   . Complication of anesthesia     "had a very hard time waking up"  . PONV (postoperative nausea and vomiting)   . GERD (gastroesophageal reflux disease)   . Headache   . Head injury, closed, with concussion ([facility]) 1986    in [place]  . Head injury 1995    concussion in [place], med evac to [place], ear damage with vertigo  . Head injury, acute, with loss of consciousness ([place]) 2000    [place]  . Fall     broken nose  . Broken nose   . Breast cancer ([place]) 04/07/15    right breast  . Allergy     Past Surgical History  Procedure Laterality Date  . Surgical procedure for endometriosis    . Tonsillectomy    . Colonoscopy w/ biopsies and polypectomy    . Nerve surgery      '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' during surgery for endometriosis  . Breast lumpectomy with needle localization and axillary sentinel lymph node bx Right 04/27/2015    Procedure: RIGHT BREAST LUMPECTOMY WITH  TWO NEEDLE LOCALIZATION AND TWO RIGHT AXILLARY SENTINEL LYMPH NODE BX;  Surgeon: Fanny Skates, MD;  Location: [facility];  Service: General;  Laterality: Right;    There were no vitals filed  for this visit.      Subjective Assessment - 12/21/15 1307    Subjective "Can stress make the swelling increase?"   Currently in Pain? Yes   Pain Score 3    Pain Location Neck   Pain Orientation Right   Aggravating Factors  moving in certain directions, like turning head right then coming back to neutral   Pain Relieving Factors stretches   Pain Score 3   Pain Location Breast   Pain Orientation Right;Upper;Lateral   Aggravating Factors  stress?   Pain Relieving Factors manual lymph drainage            [facility] PT Assessment - 12/21/15 0001    AROM   Right Shoulder Flexion 145 Degrees   Right Shoulder ABduction 168 Degrees                     [facility] Adult PT Treatment/Exercise - 12/21/15 0001    Self-Care   Other Self-Care Comments  Reviewed self-manual lymph drainage technique, particularly for right breast edema.   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[employer])   Status Achieved   PT SHORT TERM GOAL #3   Title Cervical sidebending improved to 30 degrees needed or dressing   Status Achieved   PT SHORT TERM GOAL #4   Title Pain improved by 25% with home and work activities   Status Achieved           PT Long Term Goals - 12/15/15 1852    PT LONG TERM GOAL #1   Title The patient will be independent in safe, self progression of HEP for further improvements in ROM, strength and pain reduction  12/06/15   Time 8    Period Weeks   Status Partially Met   PT LONG TERM GOAL #2   Title The patient will have improved right shoulder flexion/elevation to 160 degrees needed for overhead reaching into cabinets and the white board at work   Time 8   Period Weeks   Status On-going   PT LONG TERM GOAL #3   Title Patient will have improved right glenohumeral, scapular and cervical strength to grossly 4/5 needed for lifting medium weight objects   Status Achieved   PT LONG TERM GOAL #4   Title Patient will report a > 50% improvement in sleep   Time 8   Period Weeks   Status On-going   PT LONG TERM GOAL #5   Title Patient will report average pain with home and work ADLS 3/10    Time 8   Period Weeks   Status On-going   PT LONG TERM GOAL #6   Title FOTO functional outcome score improved from 55% limitation to 44% indicating improved function with less pain   Time 8   Period Weeks   Status On-going   PT LONG TERM GOAL #7   Title Be able to go throughout a weekend or 2-3 days without medication use for pain.   Time 6   Period Weeks   Status On-going   PT LONG TERM GOAL #8   Title Cervical rotation to 50 degrees bilaterally for driving    Time 6   Period Weeks   Status On-going           Long Term Clinic Goals - 12/21/15 1309    CC Long Term Goal  #1   Title Patient will report at least 60% decrease in discomfort at right chest area with moving the right arm.   Status Achieved   CC Long Term Goal  #2   Title Patient will report at least 60% perceived improvement in swelling at right upper flank.   Status Achieved   CC Long Term Goal  #3   Title Patient will be independent in home exercise program and self-care for edema, including manual lymph drainage.   Status Achieved   CC Long Term Goal  #4   Title Right shoulder active flexion to at least 150 degrees.   Baseline Had been 153 earlier, at 134 on 11/28/15; 139 on 12/07/15. 145 on discharge   Status Partially Met   CC Long Term Goal  #5    Title Right   shoulder active abduction to at least 150 degrees.   Status Achieved            Plan - 12/21/15 2055    Clinical Impression Statement Patient has met most goals.  Benefitted from review of self-manual lymph drainage today.  She is moving to [place] and plans to continue self-treatment.   Rehab Potential Good   Clinical Impairments Affecting Rehab Potential Breast CA with surgery (patient reports short term memory deficits as a result)   hx of 2 head injuries, hx of vertigo   PT Treatment/Interventions ADLs/Self Care Home Management;Patient/family education;Manual techniques   PT Next Visit Plan None; discharge this visit   PT Home Exercise Plan self-manual lymph drainage, stretching   Consulted and Agree with Plan of Care Patient      Patient will benefit from skilled therapeutic intervention in order to improve the following deficits and impairments:  Pain, Decreased range of motion, Decreased scar mobility, Increased edema  Visit Diagnosis: Stiffness of right shoulder, not elsewhere classified  Pain in right shoulder       G-Codes - 12/21/15 2057    Functional Assessment Tool Used clinical judgement   Functional Limitation Carrying, moving and handling objects   Carrying, Moving and Handling Objects Goal Status (G8985) At least 20 percent but less than 40 percent impaired, limited or restricted   Carrying, Moving and Handling Objects Discharge Status (G8986) At least 1 percent but less than 20 percent impaired, limited or restricted      Problem List Patient Active Problem List   Diagnosis Date Noted  . Breast cancer of upper-outer quadrant of right female breast ([facility]) 04/11/2015    SALISBURY,DONNA 12/21/2015, 9:03 PM  [facility] [address] [place], [place], [address] Phone: [phone]   Fax:  [phone]  Name: Jessica Roberts MRN: [id number] Date of Birth: 05/03/1946    PHYSICAL  THERAPY DISCHARGE SUMMARY  Visits from Start of Care: 30 (total for separate neck and right breast/axilla therapy)  Current functional level related to goals / functional outcomes: Goals mostly met as noted above.   Remaining deficits: Some intermittent discomfort at right upper outer breast and at right neck.   Education / Equipment: Self-manual lymph drainage, stretching  Plan: Patient agrees to discharge.  Patient goals were partially met. Patient is being discharged due to the patient's request.  She is doing well, but is moving to [place] for at least six months.  She is ready for discharge.????   Donna Salisbury, PT 12/21/2015 9:03 PM    

## 2015-12-22 ENCOUNTER — Other Ambulatory Visit: Payer: Self-pay | Admitting: *Deleted

## 2016-04-05 ENCOUNTER — Other Ambulatory Visit: Payer: Medicare Other

## 2016-04-05 ENCOUNTER — Ambulatory Visit: Payer: Medicare Other | Admitting: Hematology

## 2016-06-05 ENCOUNTER — Other Ambulatory Visit: Payer: Medicare Other

## 2016-06-05 ENCOUNTER — Ambulatory Visit: Payer: Medicare Other | Admitting: Hematology

## 2016-06-25 NOTE — Progress Notes (Signed)
[name]  [phone] [phone]  Clinic Follow Up Note   Patient Care Team: Cari Caraway, MD as PCP - General (Family Medicine) Fanny Skates, MD as Consulting Physician (General Surgery) Truitt Merle, MD as Consulting Physician (Hematology) Thea Silversmith, MD as Consulting Physician (Radiation Oncology) Mauro Kaufmann, RN as Registered Nurse Rockwell Germany, RN as Registered Nurse Sylvan Cheese, NP as Nurse Practitioner (Nurse Practitioner) 06/25/2016  CHIEF COMPLAINTS:  Follow up right breast stage I ductal carcinoma  Oncology History   Breast cancer of upper-outer quadrant of right female breast [facility])   Staging form: Breast, AJCC 7th Edition     Clinical stage from 04/07/2015: Stage IA (T1a, N0, M0) - Signed by Truitt Merle, MD on 05/20/2015     Pathologic stage from 04/27/2015: Stage IA (T1b, N0, cM0) - Signed by Truitt Merle, MD on 05/20/2015       Breast cancer of upper-outer quadrant of right female breast ([place])   04/01/2015 Mammogram    Diagnostic mammogram and US showed a 0.7x 0.4 x 0.4 cm mass in the right breast 11:00 position, and a second 7m lesion at upper-midline, and a benign cluster of cysts measuring 1cm.       04/07/2015 Initial Biopsy    Right breast UOQ mass core needle biopsy showed invasive ductal carcinoma and DCIS, grade 1. ER+ (100%), PR+ (80%), HER2/neu negative (ratio 1.25), Ki67 5%.      04/07/2015 Clinical Stage    Stage IA: T1a N0      04/27/2015 Definitive Surgery    Right breast double lumpectomy/SLNB: IDC, 0.9 cm, DCIS, invasive dz 0.1 cm from posterior margin, rem. margins >0.5 cm; second lump: fibrocystic change. 1 LN negative for malignancy      04/27/2015 Pathologic Stage    Stage IA: T1b N0      06/21/2015 - 07/13/2015 Radiation Therapy    Adjuvant RT: Right breast 42.72 Gy over 21 fractions.        Anti-estrogen oral therapy    Pt declined adjuvant anti-estrogen therapy       09/29/2015  Survivorship    Survivorship visit completed and copy of care plan given to patient.        HISTORY OF PRESENTING ILLNESS:  Jessica Olivero70y.o. female is here because of newly diagnosed right breast cancer. She is accompanied by her friend/roommate to our multidisciplinary clinic today.   This was discovered by screening mammogram, she did not have palpable mass, or any other new symptoms. She has been doing mammogram every year. The abnormal screening mammogram on 03/21/15 prompted a diagnostic mammogram and ultrasound on 04/01/2015, which showed 784mmass in the right breast 10:00, biopsy of the mass showed grade 1 invasive ductal carcinoma and DCIS. She also had a cystic lesion 5 mm at 11:00, and a biopsy showed fibrocystic change with focal ADH.  She developed pain and bruise after breast biopsy. But otherwise she feels well overall.  She has arthritis, mainly in fingers. She also has GERD, which is controlled by Nexium. 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 She has been taking estrogen and progesterone for the past 10 years, which she stopped a few days ago.  CURRENT THERAPY: Surveillance  INTERIM HISTORY [name] for follow-up. [place] and unfortunately developed diarrhea before she came back. She completed a course of antibiotics, and has been seeing her  primary care physician for that. It has improved lately. She also caught a cold last week, with mild cough and weight mucus production. No chest pain, dyspnea or fever.   MEDICAL HISTORY:  Past Medical History:  Diagnosis Date  . Allergy   . Anxiety   . Anxiety and depression   . Breast cancer ([place]) 04/07/15   right breast  . Breast cancer of upper-outer quadrant of right female breast ([place]) 04/11/2015  . Broken nose   . Complication of anesthesia    "had a very hard time waking up"  . Dengue fever   . Depression   .  Fall    broken nose  . GERD (gastroesophageal reflux disease)   . Head injury 1995   concussion in [place], med evac to [place], ear damage with vertigo  . Head injury, acute, with loss of consciousness ([place]) 2000   out of the country  . Head injury, closed, with concussion 1986   [place]  . Headache   . Hypertension   . Malaria   . PONV (postoperative nausea and vomiting)   . Typhoid     SURGICAL HISTORY: Past Surgical History:  Procedure Laterality Date  . BREAST LUMPECTOMY WITH NEEDLE LOCALIZATION AND AXILLARY SENTINEL LYMPH NODE BX Right 04/27/2015   Procedure: RIGHT BREAST LUMPECTOMY WITH  TWO NEEDLE LOCALIZATION AND TWO RIGHT AXILLARY SENTINEL LYMPH NODE BX;  Surgeon: Fanny Skates, MD;  Location: [facility];  Service: General;  Laterality: Right;  . COLONOSCOPY W/ BIOPSIES AND POLYPECTOMY    . NERVE SURGERY     '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' during surgery for endometriosis  . surgical procedure for endometriosis    . TONSILLECTOMY     GYN HISTORY  Menarchal: 39 LMP: 67 Contraceptive:no  HRT: yes, has been on for about 10 years  G0P0:   SOCIAL HISTORY: Social History   Social History  . Marital status: Single    Spouse name: N/A  . Number of children: 0  . Years of education: BA   Occupational History  .  [employer]   Social History Main Topics  . Smoking status: Former Smoker    Years: 30.00    Types: Cigarettes    Quit date: 01/11/1994  . Smokeless tobacco: Never Used  . Alcohol use 2.4 oz/week    4 Glasses of wine per week     Comment: moderate   . Drug use: No  . Sexual activity: No   Other Topics Concern  . Not on file   Social History Narrative   Patient lives at home with her friend.   Caffeine Use: 1/2 cup daily    FAMILY HISTORY: Family History  Problem Relation Age of Onset  . Hypertension Mother   . Thyroid disease Mother   . Breast cancer Mother   . Emphysema Father   . Diabetes Maternal Grandmother   . Heart disease  Paternal Grandmother   . Alcohol abuse Paternal Grandfather     ALLERGIES:  is allergic to codeine; percocet [oxycodone-acetaminophen]; and percodan [oxycodone-aspirin].  MEDICATIONS:  Current Outpatient Prescriptions  Medication Sig Dispense Refill  . acetaminophen (TYLENOL) 500 MG tablet Take 500 mg by mouth 2 (two) times daily as needed for moderate pain (pain). Reported on 10/12/2015    . buPROPion (WELLBUTRIN XL) 300 MG 24 hr tablet Take 300 mg by mouth daily.   1  . Cyanocobalamin (VITAMIN B-12 SL) Place 1 tablet under the tongue daily.    Marland Kitchen  diazepam (VALIUM) 5 MG tablet Take 2.5 mg by mouth daily as needed for anxiety.     . Esomeprazole Magnesium (NEXIUM PO) Take 22.3 mg by mouth every other day.    . ibuprofen (ADVIL,MOTRIN) 600 MG tablet Take 600 mg by mouth every 6 (six) hours as needed.    . Misc Natural Products (TART CHERRY ADVANCED) CAPS Take 3 capsules by mouth daily. Reported on 09/01/2015    . Multiple Vitamin (MULTIVITAMIN WITH MINERALS) TABS tablet Take 1 tablet by mouth at bedtime.    . sertraline (ZOLOFT) 100 MG tablet Take 100 mg by mouth daily.     . traZODone (DESYREL) 50 MG tablet TAKE 1 TO 1/2 TABLET BY MOUTH AT BEDTIME  1  . Vitamin D, Ergocalciferol, (DRISDOL) 50000 UNITS CAPS capsule Take 50,000 Units by mouth once a week. Reported on 09/29/2015  0   No current facility-administered medications for this visit.     REVIEW OF SYSTEMS:   Constitutional: Denies fevers, chills or abnormal night sweats Eyes: Denies blurriness of vision, double vision or watery eyes Ears, nose, mouth, throat, and face: Denies mucositis or sore throat Respiratory: Denies cough, dyspnea or wheezes Cardiovascular: Denies palpitation, chest discomfort or lower extremity swelling Gastrointestinal:  Denies nausea, heartburn or change in bowel habits Skin: Denies abnormal skin rashes Lymphatics: Denies new lymphadenopathy or easy bruising Neurological:Denies numbness, tingling or new  weaknesses Behavioral/Psych: Mood is stable, no new changes  All other systems were reviewed with the patient and are negative.  PHYSICAL EXAMINATION: ECOG PERFORMANCE STATUS: 1 - Symptomatic but completely ambulatory  Vitals:   06/26/16 1350  BP: (!) 150/80  Pulse: 90  Resp: 16  Temp: 98.7 F (37.1 C)   Filed Weights   06/26/16 1350  Weight: 155 lb 4.8 oz (70.4 kg)    GENERAL:alert, no distress and comfortable SKIN: skin color, texture, turgor are normal, no rashes or significant lesions EYES: normal, conjunctiva are pink and non-injected, sclera clear OROPHARYNX:no exudate, no erythema and lips, buccal mucosa, and tongue normal  NECK: supple, thyroid normal size, non-tender, without nodularity LYMPH:  no palpable lymphadenopathy in the cervical, axillary or inguinal LUNGS: clear to auscultation and percussion with normal breathing effort HEART: regular rate & rhythm and no murmurs and no lower extremity edema ABDOMEN:abdomen soft, non-tender and normal bowel sounds Musculoskeletal:no cyanosis of digits and no clubbing  PSYCH: alert & oriented x 3 with fluent speech NEURO: no focal motor/sensory deficits Breasts: Breast inspection showed them to be symmetrical with no nipple discharge. (+) Right breast incision site has healed well. (+) Mild skin edema and pigmentation of the right breast, especially around the nipple area, mild tenderness.  Palpation of the left breasts and axilla revealed no obvious mass that I could appreciate.   LABORATORY DATA:  I have reviewed the data as listed CBC Latest Ref Rng & Units 06/26/2016 04/22/2015 04/13/2015  WBC 3.9 - 10.3 10e3/uL 9.9 10.2 9.8  Hemoglobin 11.6 - 15.9 g/dL 12.6 12.8 13.0  Hematocrit 34.8 - 46.6 % 38.2 39.5 38.7  Platelets 145 - 400 10e3/uL 395 363 351   CMP Latest Ref Rng & Units 06/26/2016 04/22/2015 04/13/2015  Glucose 70 - 140 mg/dl 107 106(H) 94  BUN 7.0 - 26.0 mg/dL 10.7 16 18.9  Creatinine 0.6 - 1.1 mg/dL 0.8 0.87 0.8   Sodium 136 - 145 mEq/L 142 140 141  Potassium 3.5 - 5.1 mEq/L 4.4 3.9 4.3  Chloride 101 - 111 mmol/L - 108 -  CO2 22 -  29 mEq/L '22 23 24  '$ Calcium 8.4 - 10.4 mg/dL 10.2 9.1 9.8  Total Protein 6.4 - 8.3 g/dL 7.7 7.0 7.2  Total Bilirubin 0.20 - 1.20 mg/dL 0.26 0.3 0.27  Alkaline Phos 40 - 150 U/L 109 78 77  AST 5 - 34 U/L '15 20 15  '$ ALT 0 - 55 U/L '17 19 19     '$ PATHOLOGY REPORT  Diagnosis 04/07/2015 1. Breast, right, needle core biopsy, upper midline - FIBROCYTIC CHANGES WITH FOCAL ATYPICAL DUCTAL HYPERPLASIA. - FIBROADENOMA. 2. Breast, right, needle core biopsy, upper outer - INVASIVE DUCTAL CARCINOMA. - DUCTAL CARCINOMA IN SITU. Microscopic Comment 1. There is focal atypical ductal hyperplasia associated with fibrocystic changes and a fibroadenoma. 2. The findings are consistent with grade 1 invasive ductal carcinoma. Breast prognostic profile will be performed.  2. PROGNOSTIC INDICATORS Results: IMMUNOHISTOCHEMICAL AND MORPHOMETRIC ANALYSIS PERFORMED MANUALLY Estrogen Receptor: 100%, POSITIVE, STRONG STAINING INTENSITY Progesterone Receptor: 80%, POSITIVE, STONG STAINING INTENSITY Proliferation Marker Ki67: 5% Results: HER2 - NEGATIVE RATIO OF HER2/CEP17 SIGNALS 1.25 AVERAGE HER2 COPY NUMBER PER CELL 2.00  Diagnosis 04/27/2015 1. Breast, lumpectomy, right upper outer quadrant - INVASIVE DUCTAL CARCINOMA, 0.9 CM. - DUCTAL CARCINOMA IN SITU. - INVASIVE CARCINOMA, 0.1 CM FROM POSTERIOR MARGIN. - REMAINING MARGINS GREATER THAN 0.5 CM. - FIBROCYSTIC CHANGES WITH CALCIFICATIONS. - BIOPSY SITE REACTION. 2. Breast, lumpectomy, right - FIBROCYSTIC CHANGES WITH FLORID USUAL DUCTAL HYPERPLASIA. - NO RESIDUAL ATYPICAL DUCTAL HYPERPLASIA. - BIOPSY SITE REACTION. 3. Lymph node, sentinel, biopsy, right axillary #1 - ONE BENIGN LYMPH NODE (0/1). 4. Fatty tissue, right additional axillary - BENIGN ADIPOSE TISSUE AND SKELETAL MUSCLE. - NO EVIDENCE OF MALIGNANCY. Microscopic  Comment 1. BREAST, INVASIVE TUMOR, WITH LYMPH NODES PRESENT Specimen, including laterality and lymph node sampling (sentinel, non-sentinel): Right breast and one sentinel lymph node. Procedure: Localized lumpectomy and sentinel lymph node biopsy. Histologic type: Ductal. Grade: 1 Tubule formation: 1 Nuclear pleomorphism: 1 Mitotic: 1 Tumor size (gross measurement): 0.9 cm Margins: Invasive, distance to closest margin: 0.1 cm from posterior margin. In-situ, distance to closest margin: 0.5 cm from posterior margin. If margin positive, focally or broadly: N/A Lymphovascular invasion: No Ductal carcinoma in situ: Present Grade: Low grade. Extensive intraductal component: No Lobular neoplasia: No Tumor focality: Unifocal. Treatment effect: No If present, treatment effect in breast tissue, lymph nodes or both: N/A Extent of tumor: Skin: N/A Nipple: N/A Skeletal muscle: Free of tumor. Lymph nodes: Examined: 1 Sentinel 0 Non-sentinel 1 Total Lymph nodes with metastasis: 0 Isolated tumor cells (< 0.2 mm): 0 Micrometastasis: (> 0.2 mm and < 2.0 mm): 0 Macrometastasis: (> 2.0 mm): 0 Extracapsular extension: N/A Breast prognostic profile: Case # [id number] Estrogen receptor: 100%, strong staining. Progesterone receptor: 80%, strong staining. Her 2 neu: Negative. Ratio 1.25. Will be repeated on current specimen. Ki-67: 5% Non-neoplastic breast: Fibrocystic changes. TNM: pT1b, pN0, pMX Results: HER2 - NEGATIVE RATIO OF HER2/CEP17 SIGNALS 1.33 AVERAGE HER2 COPY NUMBER PER CELL 2.00  Oncotype Dx, recurrence score 15, which predicts 10 year distant recurrence risk of 10% with tamoxifen.  RADIOGRAPHIC STUDIES: I have personally reviewed the radiological images as listed and agreed with the findings in the report.  MM diag and US Breast Ltd Uni Left Inc Axilla 04/01/2015  IMPRESSION: Irregular hypoechoic mass within the right breast at the 11 o'clock axis, 5 cm from the nipple,  measuring 0.5 x 0.4 x 0.4 cm, corresponding to the mammographic finding. This is a suspicious finding for which ultrasound-guided biopsy is recommended.  RECOMMENDATION: Ultrasound-guided  biopsy of the right breast mass located at the 11 o'clock axis, 5 cm from the nipple, measuring 0.5 x 0.4 x 0.4 cm.  Ultrasound-guided biopsy is scheduled for August 23rd at 3 p.m.  I have discussed the findings and recommendations with the patient. Results were also provided in writing at the conclusion of the visit. If applicable, a reminder letter will be sent to the patient regarding the next appointment.  BI-RADS CATEGORY  4: Suspicious.   Electronically Signed   By: Franki Cabot M.D.   On: 04/01/2015 16:31   Mm Screening Breast Tomo Bilateral  03/23/2015   CLINICAL DATA:  Screening.  EXAM: DIGITAL SCREENING BILATERAL MAMMOGRAM WITH 3D TOMO WITH CAD  COMPARISON:  Previous exam(s).  ACR Breast Density Category c: The breast tissue is heterogeneously dense, which may obscure small masses.  FINDINGS: In the right breast possible mass requires further evaluation.  In the left breast possible mass requires further evaluation.  Images were processed with CAD.  IMPRESSION: Further evaluation is suggested for possible mass in the right breast.  Further evaluation is suggested for possible mass in the left breast.  RECOMMENDATION: Diagnostic mammogram and possibly ultrasound of both breasts. (Code:[id number]-91M)  The patient will be contacted regarding the findings, and additional imaging will be scheduled.  BI-RADS CATEGORY  0: Incomplete. Need additional imaging evaluation and/or prior mammograms for comparison.   Electronically Signed   By: Lovey Newcomer M.D.   On: 03/23/2015 11:56    ASSESSMENT & PLAN:  70 year old Caucasian female, postmenopausal, was found to have a right breast cancer by screening mammogram.  1. Right breast invasive ductal carcinoma, pT1bN0M0, stage IA, grade 1, ER positive, PR positive, HER-2 negative, (+)  DCIS and ADH -I previously reviewed her surgical path findings with her in great details. -She has stage I breast cancer, likely has been cured with complete surgical resection.  -I reviewed her Oncotype DX test result with her in details. She has low risk of score (15), which predicts 10 year distant risk of recurrence about 10% with tamoxifen. I did not recommend adjuvant chemotherapy -She declined adjuvant antiestrogen therapy -She is clinically doing well, lab and physical exam are unremarkable today, no clinical concern of recurrence. -She will continue breast cancer surveillance, with annual screening mammogram, self-exam and follow-up with [place] every 6-12 months -She is overdue for mammogram, I'll order for today -I encouraged her to eat healthy and exercise regularly.  -I encouraged her to take calcium and vitamin D for bone health  2. Depression -she will continue medication and follow up with her psychiatrist  3. Diarrhea  -she has seen her PCP for these, improving, will follow up   4. URI -I will obtain a CXR today to rule out pulmonary infection -I will hold on antibiotics if CXR is negative  -I suspect this is likely a viral infection   Plan -she declined adjuvant antiestrogen therapy, agreed with surveillance -I'll see her back in 6 months with lab.  All questions were answered. The patient knows to call the clinic with any problems, questions or concerns. I spent 25 minutes counseling the patient face to face. The total time spent in the appointment was 30 minutes and more than 50% was on counseling.     Truitt Merle, MD 06/25/2016 8:53 PM

## 2016-06-26 ENCOUNTER — Ambulatory Visit (HOSPITAL_COMMUNITY)
Admission: RE | Admit: 2016-06-26 | Discharge: 2016-06-26 | Disposition: A | Discharge status: Home / Self Care | Payer: Medicare Other | Source: Ambulatory Visit | Attending: Hematology | Admitting: Hematology

## 2016-06-26 ENCOUNTER — Ambulatory Visit (HOSPITAL_BASED_OUTPATIENT_CLINIC_OR_DEPARTMENT_OTHER): Payer: Medicare Other | Admitting: Hematology

## 2016-06-26 ENCOUNTER — Other Ambulatory Visit (HOSPITAL_COMMUNITY)
Admission: AD | Admit: 2016-06-26 | Discharge: 2016-06-26 | Disposition: A | Discharge status: Home / Self Care | Payer: Medicare Other | Source: Ambulatory Visit | Attending: Hematology | Admitting: Hematology

## 2016-06-26 ENCOUNTER — Ambulatory Visit: Payer: Medicare Other

## 2016-06-26 ENCOUNTER — Telehealth: Payer: Self-pay | Admitting: Hematology

## 2016-06-26 ENCOUNTER — Other Ambulatory Visit (HOSPITAL_BASED_OUTPATIENT_CLINIC_OR_DEPARTMENT_OTHER): Payer: Medicare Other

## 2016-06-26 VITALS — BP 150/80 | HR 90 | Temp 98.7°F | Resp 16 | Ht 66.0 in | Wt 155.3 lb

## 2016-06-26 DIAGNOSIS — A09 Infectious gastroenteritis and colitis, unspecified: Secondary | ICD-10-CM

## 2016-06-26 DIAGNOSIS — C50411 Malignant neoplasm of upper-outer quadrant of right female breast: Secondary | ICD-10-CM

## 2016-06-26 DIAGNOSIS — J069 Acute upper respiratory infection, unspecified: Secondary | ICD-10-CM

## 2016-06-26 DIAGNOSIS — B9789 Other viral agents as the cause of diseases classified elsewhere: Secondary | ICD-10-CM | POA: Insufficient documentation

## 2016-06-26 DIAGNOSIS — R197 Diarrhea, unspecified: Secondary | ICD-10-CM | POA: Diagnosis not present

## 2016-06-26 DIAGNOSIS — F329 Major depressive disorder, single episode, unspecified: Secondary | ICD-10-CM

## 2016-06-26 LAB — COMPREHENSIVE METABOLIC PANEL
ALT: 17 U/L (ref 0–55)
AST: 15 U/L (ref 5–34)
Albumin: 3.5 g/dL (ref 3.5–5.0)
Alkaline Phosphatase: 109 U/L (ref 40–150)
Anion Gap: 10 mEq/L (ref 3–11)
BILIRUBIN TOTAL: 0.26 mg/dL (ref 0.20–1.20)
BUN: 10.7 mg/dL (ref 7.0–26.0)
CO2: 22 meq/L (ref 22–29)
Calcium: 10.2 mg/dL (ref 8.4–10.4)
Chloride: 109 mEq/L (ref 98–109)
Creatinine: 0.8 mg/dL (ref 0.6–1.1)
EGFR: 76 mL/min/{1.73_m2} — AB (ref >90)
GLUCOSE: 107 mg/dL (ref 70–140)
Potassium: 4.4 mEq/L (ref 3.5–5.1)
SODIUM: 142 meq/L (ref 136–145)
TOTAL PROTEIN: 7.7 g/dL (ref 6.4–8.3)

## 2016-06-26 LAB — CBC WITH DIFFERENTIAL/PLATELET
BASO%: 0.7 % (ref 0.0–2.0)
Basophils Absolute: 0.1 10*3/uL (ref 0.0–0.1)
EOS%: 6.9 % (ref 0.0–7.0)
Eosinophils Absolute: 0.7 10*3/uL — ABNORMAL HIGH (ref 0.0–0.5)
HCT: 38.2 % (ref 34.8–46.6)
HGB: 12.6 g/dL (ref 11.6–15.9)
LYMPH%: 27.1 % (ref 14.0–49.7)
MCH: 29 pg (ref 25.1–34.0)
MCHC: 33 g/dL (ref 31.5–36.0)
MCV: 87.8 fL (ref 79.5–101.0)
MONO#: 0.6 10*3/uL (ref 0.1–0.9)
MONO%: 6.1 % (ref 0.0–14.0)
NEUT%: 59.2 % (ref 38.4–76.8)
NEUTROS ABS: 5.9 10*3/uL (ref 1.5–6.5)
Platelets: 395 10*3/uL (ref 145–400)
RBC: 4.35 10*6/uL (ref 3.70–5.45)
RDW: 14.6 % — ABNORMAL HIGH (ref 11.2–14.5)
WBC: 9.9 10*3/uL (ref 3.9–10.3)
lymph#: 2.7 10*3/uL (ref 0.9–3.3)

## 2016-06-26 NOTE — Telephone Encounter (Signed)
Appointments scheduled per 11/14 LOS. Patient given AVS report and calendars with future scheduled appointments. ° °

## 2016-07-01 ENCOUNTER — Encounter: Payer: Self-pay | Admitting: Hematology

## 2016-07-02 ENCOUNTER — Telehealth: Payer: Self-pay | Admitting: *Deleted

## 2016-07-02 DIAGNOSIS — J209 Acute bronchitis, unspecified: Secondary | ICD-10-CM | POA: Diagnosis not present

## 2016-07-02 NOTE — Telephone Encounter (Signed)
Spoke with pt and was informed that pt still not feeling better.  Went to Urgent Care this afternoon, and was prescribed Tussionex for coughing.  Pt was given script for Zithromax, but was instructed not to take it yet.  Pt was told to let the symptoms resolve themselves.   Informed pt that CXR from 11/14 was negative.  Reinforced increased water intake as tolerated.  Cautioned pt that Tussionex can cause drowsiness.  Informed pt that she can also take Mucinex during day to help with the coughing, and take Tussionex at night to help her sleep.   Pt voiced understanding.

## 2016-07-04 ENCOUNTER — Other Ambulatory Visit: Payer: Self-pay | Admitting: General Surgery

## 2016-07-04 DIAGNOSIS — N6091 Unspecified benign mammary dysplasia of right breast: Secondary | ICD-10-CM | POA: Diagnosis not present

## 2016-07-04 DIAGNOSIS — C50911 Malignant neoplasm of unspecified site of right female breast: Secondary | ICD-10-CM | POA: Diagnosis not present

## 2016-07-04 DIAGNOSIS — I10 Essential (primary) hypertension: Secondary | ICD-10-CM | POA: Diagnosis not present

## 2016-07-04 DIAGNOSIS — Z803 Family history of malignant neoplasm of breast: Secondary | ICD-10-CM | POA: Diagnosis not present

## 2016-07-04 DIAGNOSIS — F329 Major depressive disorder, single episode, unspecified: Secondary | ICD-10-CM | POA: Diagnosis not present

## 2016-07-04 DIAGNOSIS — K219 Gastro-esophageal reflux disease without esophagitis: Secondary | ICD-10-CM | POA: Diagnosis not present

## 2016-07-17 ENCOUNTER — Other Ambulatory Visit: Payer: Self-pay | Admitting: General Surgery

## 2016-07-17 ENCOUNTER — Ambulatory Visit
Admission: RE | Admit: 2016-07-17 | Discharge: 2016-07-17 | Disposition: A | Discharge status: Home / Self Care | Payer: Medicare Other | Source: Ambulatory Visit | Attending: General Surgery | Admitting: General Surgery

## 2016-07-17 DIAGNOSIS — J208 Acute bronchitis due to other specified organisms: Secondary | ICD-10-CM | POA: Diagnosis not present

## 2016-07-17 DIAGNOSIS — N6310 Unspecified lump in the right breast, unspecified quadrant: Secondary | ICD-10-CM

## 2016-07-17 DIAGNOSIS — J42 Unspecified chronic bronchitis: Secondary | ICD-10-CM | POA: Diagnosis not present

## 2016-07-17 DIAGNOSIS — B9689 Other specified bacterial agents as the cause of diseases classified elsewhere: Secondary | ICD-10-CM | POA: Diagnosis not present

## 2016-07-17 DIAGNOSIS — R928 Other abnormal and inconclusive findings on diagnostic imaging of breast: Secondary | ICD-10-CM | POA: Diagnosis not present

## 2016-07-17 DIAGNOSIS — N6091 Unspecified benign mammary dysplasia of right breast: Secondary | ICD-10-CM

## 2016-07-17 DIAGNOSIS — N6001 Solitary cyst of right breast: Secondary | ICD-10-CM | POA: Diagnosis not present

## 2016-08-21 ENCOUNTER — Other Ambulatory Visit: Payer: Self-pay | Admitting: General Surgery

## 2016-08-21 DIAGNOSIS — N6310 Unspecified lump in the right breast, unspecified quadrant: Secondary | ICD-10-CM

## 2016-09-04 ENCOUNTER — Ambulatory Visit
Admission: RE | Admit: 2016-09-04 | Discharge: 2016-09-04 | Disposition: A | Discharge status: Home / Self Care | Payer: Medicare Other | Source: Ambulatory Visit | Attending: General Surgery | Admitting: General Surgery

## 2016-09-04 DIAGNOSIS — N6310 Unspecified lump in the right breast, unspecified quadrant: Secondary | ICD-10-CM | POA: Diagnosis not present

## 2016-09-27 IMAGING — US US  BREAST BX W/ LOC DEV 1ST LESION IMG BX SPEC US GUIDE*R*
1 series · 12 of 14 positions shown · non-contrast
Comparison: Previous exam(s).

ADDENDUM:
Site #1: Pathology reveals focal atypical ductal hyperplasia with
fibrocystic changes and a fibroadenoma of the upper midline of the
Right breast.

Site #2: Pathology reveals Grade I Invasive ductal carcinoma and
ductal carcinoma in situ of the upper outer Right breast.
This was found to be concordant by Dr. Moarabedi Mahalelo. Pathology
results were discussed with the patient via telephone. The patient
reported tenderness at the biopsy site and is doing well otherwise.
Post biopsy care and instructions were reviewed and questions were
answered. The patient was encouraged to call The [REDACTED] with any additional questions and or concerns.
The patient was referred to [REDACTED]
Multi-disciplinary Clinic at [REDACTED] on
April 13, 2015.
Pathology results reported by Auntyjatty Delowr RN on April 08, 2015.
CLINICAL DATA: Patient is an asymptomatic 68-year-old female who
presents for ultrasound-guided core needle biopsy of the right
breast.
EXAM:
ULTRASOUND GUIDED RIGHT BREAST CORE NEEDLE BIOPSY

[Series 1: us breast bx w/ loc dev 1st lesion img bx spec us  · 0.06mm/px · 12 of 14 slices shown]
[im 1/14]
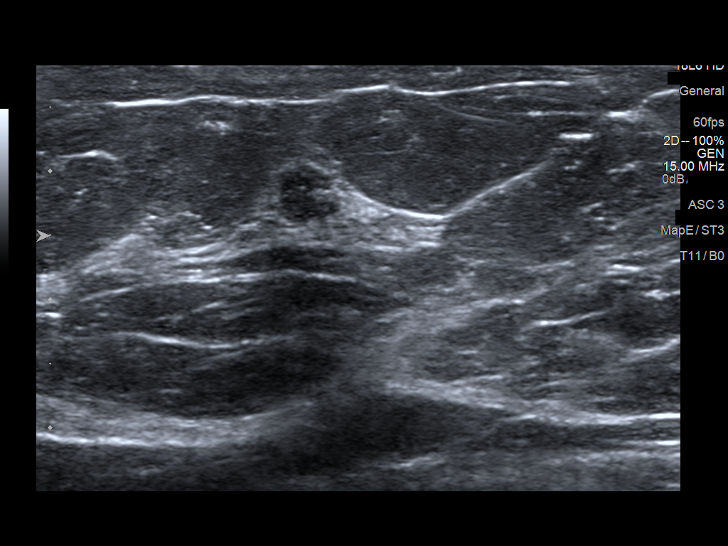
[im 2/14]
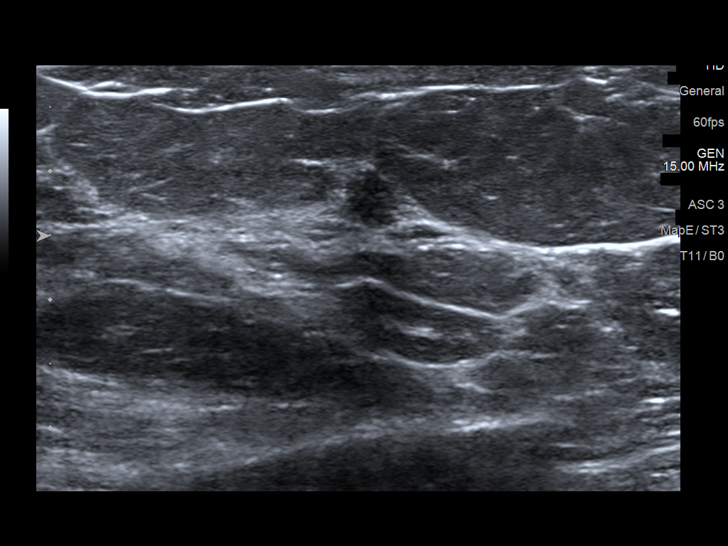
[im 3/14]
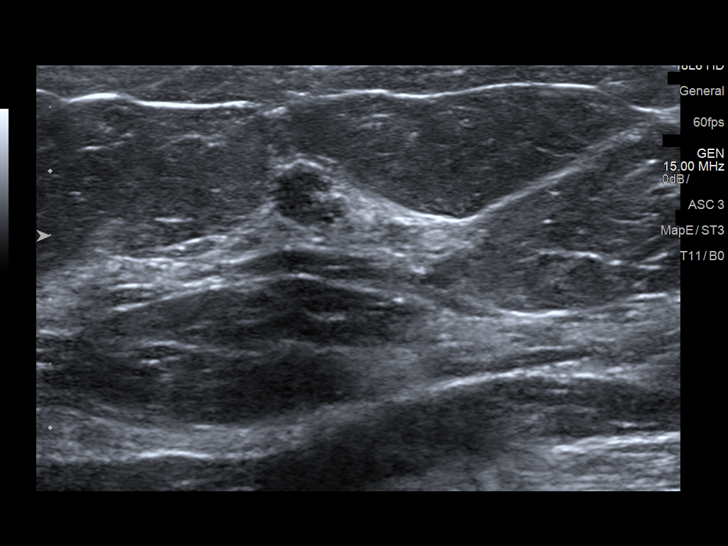
[im 5/14]
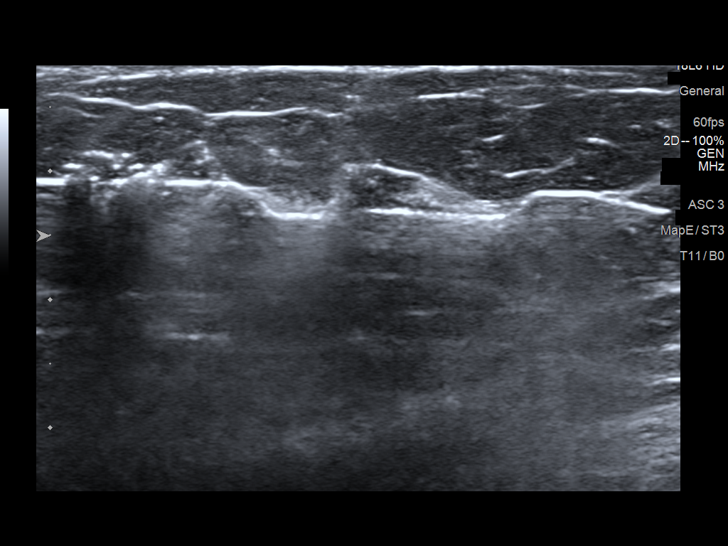
[im 6/14]
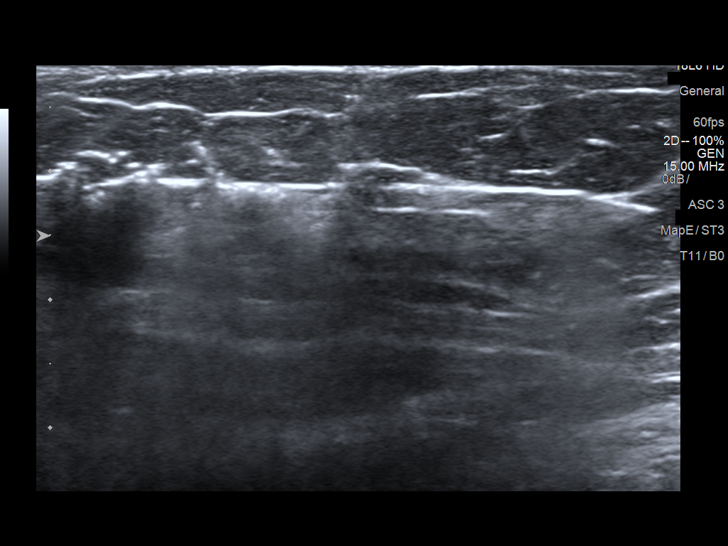
[im 7/14]
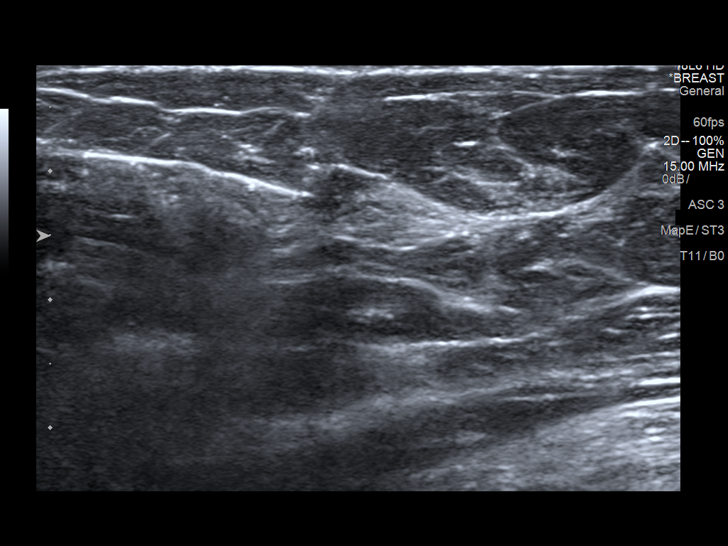
[im 8/14]
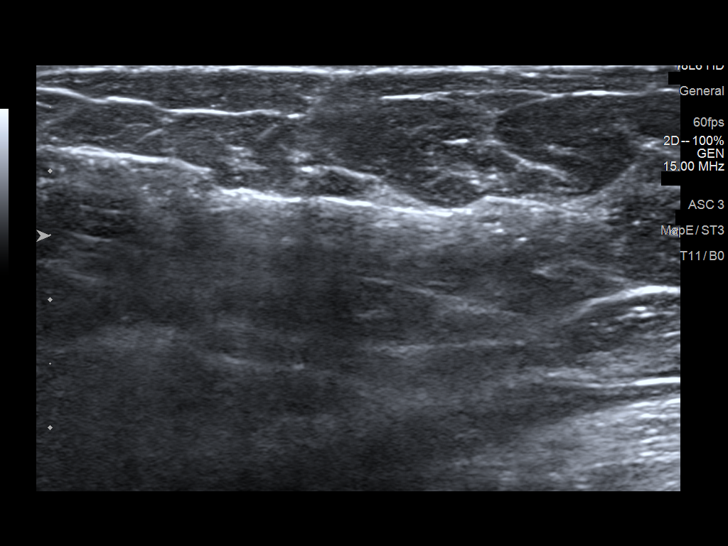
[im 9/14]
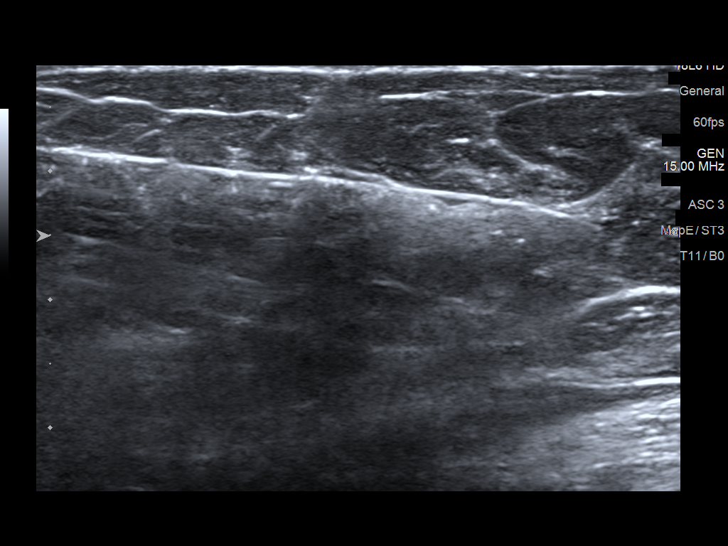
[im 10/14]
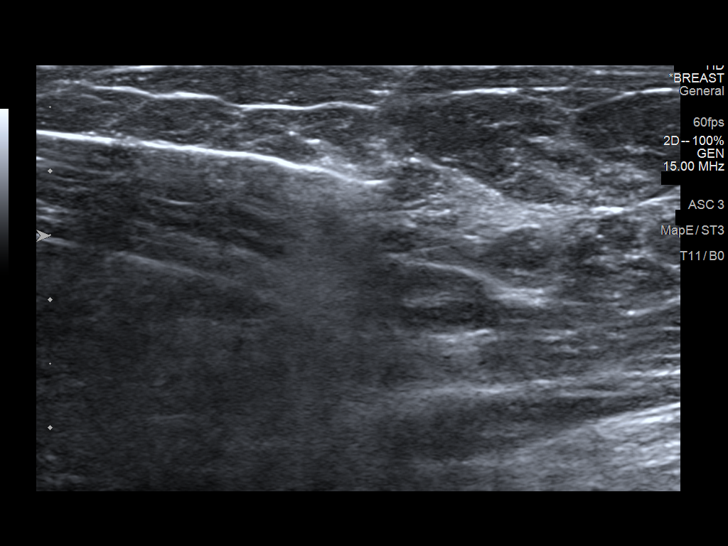
[im 12/14]
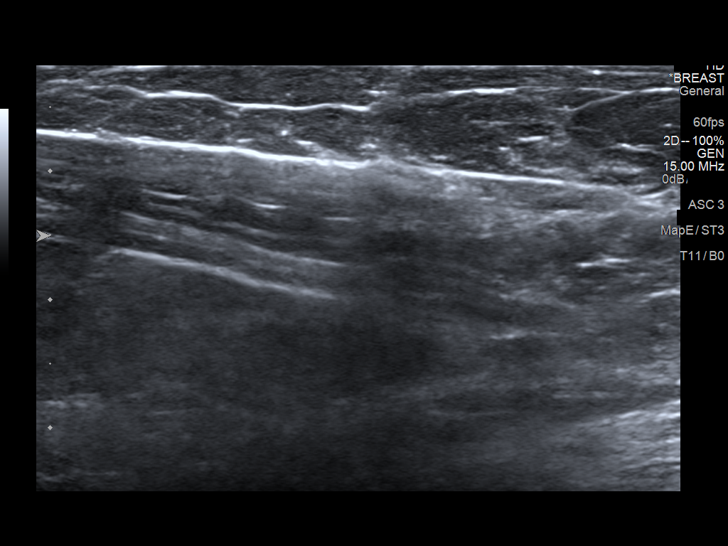
[im 13/14]
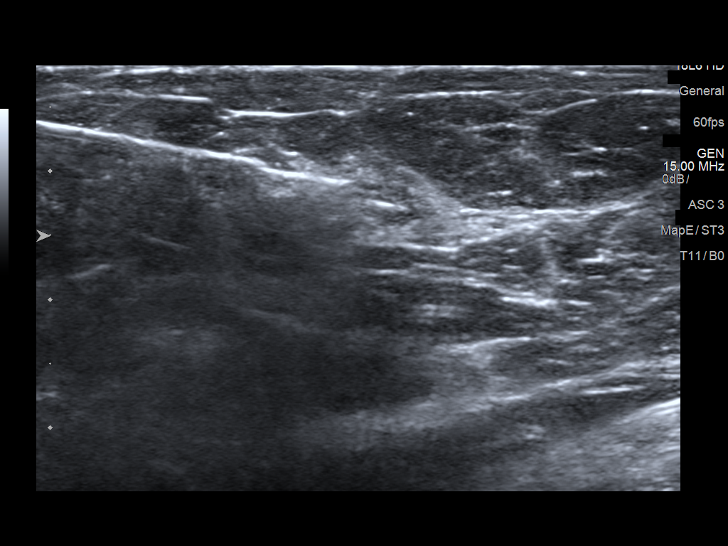
[im 14/14]
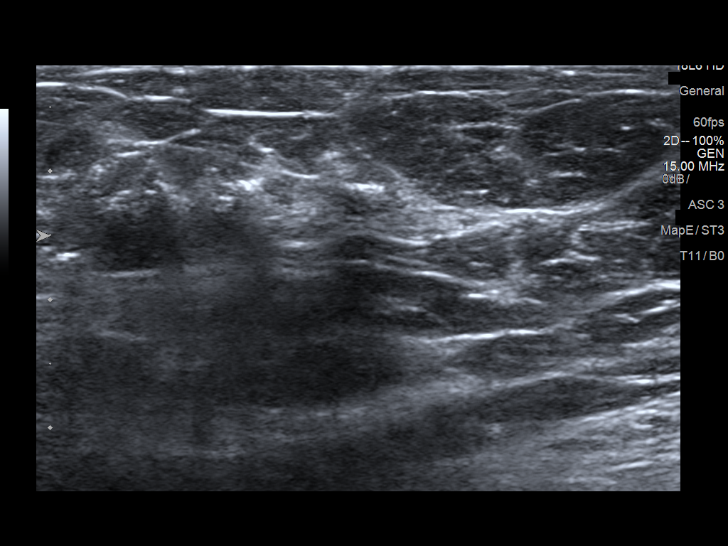

[12 of 14 positions shown; findings below may reference images not displayed]

PROCEDURE:
I met with the patient and we discussed the procedure of
ultrasound-guided biopsy, including benefits and alternatives. We
discussed the high likelihood of a successful procedure. We
discussed the risks of the procedure including infection, bleeding,
tissue injury, clip migration, and inadequate sampling. Informed
written consent was given. The usual time-out protocol was performed
immediately prior to the procedure.

After reviewing diagnostic mammography, second-look ultrasound was
performed in the posterior upper outer right breast. This
demonstrates a subtle heterogeneously hypoechoic irregular mass in
the posterior breast at 10 o'clock 8 cm from the nipple measuring
approximately 7 x 5 x 5 mm. This is likely associated with a small
degree of distortion represents a good sonographic correlate to the
suspicious mass noted on mammography. This will also be targeted for
ultrasound-guided core needle biopsy.

Site #1 Upper midline: Using sterile technique and 2% Lidocaine as
local anesthetic, under direct ultrasound visualization, a 12 gauge
vacuum-assisted device was used to perform biopsy of hypoechoic
irregular mass at 11 o'clock using a lateral approach. At the
conclusion of the procedure, a ribbon shaped tissue marker clip was
deployed into the biopsy cavity.

Site #2 Upper outer: Using sterile technique and 2% lidocaine as
local anesthetic, on direct ultrasound visualization, a 12 gauge
vacuum assisted device was used to perform biopsy of a hypoechoic
irregular mass at 10 o'clock using a lateral approach. At the
conclusion of the procedure, a coil shaped tissue marker clip was
deployed into the biopsied mass. Follow-up two-view mammogram was
performed and dictated separately.
IMPRESSION: Ultrasound-guided biopsy of two sites in the right breast. No
apparent complications. Pathology is pending.

## 2016-12-24 ENCOUNTER — Other Ambulatory Visit: Payer: Medicare Other

## 2016-12-24 ENCOUNTER — Encounter: Payer: Medicare Other | Admitting: Hematology

## 2016-12-24 NOTE — Progress Notes (Signed)
This encounter was created in error - please disregard.

## 2017-01-23 ENCOUNTER — Telehealth: Payer: Self-pay | Admitting: Hematology

## 2017-01-23 NOTE — Telephone Encounter (Signed)
Per YF moved 6/18 lab/fu to July. See also 6/10 schedule message. Schedule mailed. No able to reach patient by phone and per voice mail recording patient does not check messages.

## 2017-01-28 ENCOUNTER — Ambulatory Visit: Payer: Medicare Other | Admitting: Hematology

## 2017-01-28 ENCOUNTER — Other Ambulatory Visit: Payer: Medicare Other

## 2017-02-10 ENCOUNTER — Telehealth: Payer: Self-pay | Admitting: Hematology

## 2017-02-10 NOTE — Telephone Encounter (Signed)
Lvm advising appt chgd from 7/9 (md call) to 7/23 @ 3pm. Also mailed appt calendar as pt's vm states she does not check it.

## 2017-02-18 ENCOUNTER — Ambulatory Visit: Payer: Medicare Other | Admitting: Hematology

## 2017-02-18 ENCOUNTER — Other Ambulatory Visit: Payer: Medicare Other

## 2017-03-01 DIAGNOSIS — L821 Other seborrheic keratosis: Secondary | ICD-10-CM | POA: Diagnosis not present

## 2017-03-01 DIAGNOSIS — L720 Epidermal cyst: Secondary | ICD-10-CM | POA: Diagnosis not present

## 2017-03-01 DIAGNOSIS — L820 Inflamed seborrheic keratosis: Secondary | ICD-10-CM | POA: Diagnosis not present

## 2017-03-04 ENCOUNTER — Other Ambulatory Visit: Payer: Medicare Other

## 2017-03-04 ENCOUNTER — Ambulatory Visit: Payer: Medicare Other | Admitting: Hematology

## 2017-03-13 ENCOUNTER — Telehealth: Payer: Self-pay | Admitting: Hematology

## 2017-03-13 NOTE — Telephone Encounter (Signed)
Voicemail says not to leave message - reminder letter sent for 8/20 appt.

## 2017-03-18 ENCOUNTER — Other Ambulatory Visit: Payer: Self-pay | Admitting: General Surgery

## 2017-03-18 DIAGNOSIS — N6489 Other specified disorders of breast: Secondary | ICD-10-CM

## 2017-03-18 DIAGNOSIS — T888XXD Other specified complications of surgical and medical care, not elsewhere classified, subsequent encounter: Secondary | ICD-10-CM

## 2017-03-21 ENCOUNTER — Other Ambulatory Visit: Payer: Self-pay | Admitting: General Surgery

## 2017-03-21 ENCOUNTER — Ambulatory Visit
Admission: RE | Admit: 2017-03-21 | Discharge: 2017-03-21 | Disposition: A | Discharge status: Home / Self Care | Payer: Medicare Other | Source: Ambulatory Visit | Attending: General Surgery | Admitting: General Surgery

## 2017-03-21 ENCOUNTER — Other Ambulatory Visit: Payer: Medicare Other

## 2017-03-21 DIAGNOSIS — T888XXD Other specified complications of surgical and medical care, not elsewhere classified, subsequent encounter: Secondary | ICD-10-CM

## 2017-03-21 DIAGNOSIS — N6489 Other specified disorders of breast: Secondary | ICD-10-CM

## 2017-03-21 DIAGNOSIS — R928 Other abnormal and inconclusive findings on diagnostic imaging of breast: Secondary | ICD-10-CM | POA: Diagnosis not present

## 2017-03-28 NOTE — Progress Notes (Signed)
[place]  [phone] [phone]) [phone]  Clinic Follow Up Note   Patient Care Team: Jessica Caraway, MD as PCP - General (Family Medicine) Jessica Skates, MD as Consulting Physician (General Surgery) Jessica Merle, MD as Consulting Physician (Hematology) Jessica Silversmith, MD (Inactive) as Consulting Physician (Radiation Oncology) Jessica Kaufmann, RN as Registered Nurse Jessica Germany, RN as Registered Nurse Jessica Cheese, NP as Nurse Practitioner (Nurse Practitioner) 04/01/2017  CHIEF COMPLAINTS:  Follow up right breast stage I ductal carcinoma  Oncology History   Breast cancer of upper-outer quadrant of right female breast [facility])   Staging form: Breast, AJCC 7th Edition     Clinical stage from 04/07/2015: Stage IA (T1a, N0, M0) - Signed by Jessica Merle, MD on 05/20/2015     Pathologic stage from 04/27/2015: Stage IA (T1b, N0, cM0) - Signed by Jessica Merle, MD on 05/20/2015       Breast cancer of upper-outer quadrant of right female breast ([place])   04/01/2015 Mammogram    Diagnostic mammogram and US showed a 0.7x 0.4 x 0.4 cm mass in the right breast 11:00 position, and a second 1m lesion at upper-midline, and a benign cluster of cysts measuring 1cm.       04/07/2015 Initial Biopsy    Right breast UOQ mass core needle biopsy showed invasive ductal carcinoma and DCIS, grade 1. ER+ (100%), PR+ (80%), HER2/neu negative (ratio 1.25), Ki67 5%.      04/07/2015 Clinical Stage    Stage IA: T1a N0      04/27/2015 Definitive Surgery    Right breast double lumpectomy/SLNB: IDC, 0.9 cm, DCIS, invasive dz 0.1 cm from posterior margin, rem. margins >0.5 cm; second lump: fibrocystic change. 1 LN negative for malignancy      04/27/2015 Pathologic Stage    Stage IA: T1b N0      06/21/2015 - 07/13/2015 Radiation Therapy    Adjuvant RT: Right breast 42.72 Gy over 21 fractions.        Anti-estrogen oral therapy    Pt declined adjuvant anti-estrogen therapy         09/29/2015 Survivorship    Survivorship visit completed and copy of care plan given to patient.      07/17/2016 Imaging    MM DIAG BREAST TOMO BILATERALL 07/17/16 MPRESSION: Probable mildly complicated fluid collection/seroma within the right breast 1130 o'clock along the posterior margin of the lumpectomy site.      09/04/2016 Imaging    [place] UN[place] RIGHT INC AXILLA: 09/04/16 IMPRESSION: Stable probably benign postsurgical fluid collection/seroma within the right breast at the 11:30 o'clock axis, 6 cm from the nipple, measuring 2.3 x 0.9 x 1.2 cm. Recommend additional follow-up right breast diagnostic mammogram and ultrasound in 6 months to ensure continued stability.        HISTORY OF PRESENTING ILLNESS:  Jessica Gawronski71y.o. female is here because of newly diagnosed right breast cancer. She is accompanied by her friend/roommate to our multidisciplinary clinic today.   This was discovered by screening mammogram, she did not have palpable mass, or any other new symptoms. She has been doing mammogram every year. The abnormal screening mammogram on 03/21/15 prompted a diagnostic mammogram and ultrasound on 04/01/2015, which showed 70mmass in the right breast 10:00, biopsy of the mass showed grade 1 invasive ductal carcinoma and DCIS. She also had a cystic lesion 5 mm at 11:00, and a biopsy showed fibrocystic change with focal ADH.  She developed pain and bruise  after breast biopsy. But otherwise she feels well overall.  She has arthritis, mainly in fingers. She also has GERD, which is controlled by Nexium. 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 She is single, has no children. She has been taking estrogen and progesterone for the past 10 years, which she stopped a few days ago.  CURRENT THERAPY: Surveillance  INTERIM HISTORY Jessica Roberts returns for follow-up. The patient's primary care office was bought out and she has not established  herself with a new PCP. She is requesting a refill of valium and trazodone from our office. Her sister was recently diagnosed with aplastic anemia. She has been back and forth to [place]. She is happy to be back. However, last week little things went wrong and she has felt down. She has had depression before and is taking medication. If she isn't feeling better in a couple days, she is planning to call a counselor to discuss this since it has lasted for about 5 days now. She denies any self harming thoughts. This feeling of being down started last Friday. She reports being mentally tired. She joined a gym and went. She reports weight gain and being an emotional eater. She has gained 20 lbs since 06/26/2016. She reports breast pain at the surgical site managed with Tylenol. This pain starts if she is doing too much exercise or does a lot of lifting.   MEDICAL HISTORY:  Past Medical History:  Diagnosis Date  . Allergy   . Anxiety   . Anxiety and depression   . Breast cancer ([place]) 04/07/15   right breast  . Breast cancer of upper-outer quadrant of right female breast ([place]) 04/11/2015  . Broken nose   . Complication of anesthesia    "had a very hard time waking up"  . Dengue fever   . Depression   . Fall    broken nose  . GERD (gastroesophageal reflux disease)   . Head injury 1995   concussion in [place], med evac to [place], ear damage with vertigo  . Head injury, acute, with loss of consciousness ([place]) 2000   out of the country  . Head injury, closed, with concussion 1986   [place]  . Headache   . Hypertension   . Malaria   . PONV (postoperative nausea and vomiting)   . Typhoid     SURGICAL HISTORY: Past Surgical History:  Procedure Laterality Date  . BREAST LUMPECTOMY Right 04/27/2015  . BREAST LUMPECTOMY WITH NEEDLE LOCALIZATION AND AXILLARY SENTINEL LYMPH NODE BX Right 04/27/2015   Procedure: RIGHT BREAST LUMPECTOMY WITH  TWO NEEDLE LOCALIZATION AND TWO RIGHT AXILLARY SENTINEL  LYMPH NODE BX;  Surgeon: Jessica Skates, MD;  Location: [facility];  Service: General;  Laterality: Right;  . COLONOSCOPY W/ BIOPSIES AND POLYPECTOMY    . NERVE SURGERY     '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' during surgery for endometriosis  . surgical procedure for endometriosis    . TONSILLECTOMY     GYN HISTORY  Menarchal: 89 LMP: 24 Contraceptive:no  HRT: yes, has been on for about 10 years  G0P0:   SOCIAL HISTORY: Social History   Social History  . Marital status: Single    Spouse name: N/A  . Number of children: 0  . Years of education: BA   Occupational History  .  [employer]   Social History Main Topics  . Smoking status: Former Smoker    Years: 30.00  Types: Cigarettes    Quit date: 01/11/1994  . Smokeless tobacco: Never Used  . Alcohol use 2.4 oz/week    4 Glasses of wine per week     Comment: moderate   . Drug use: No  . Sexual activity: No   Other Topics Concern  . Not on file   Social History Narrative   Patient lives at home with her friend.   Caffeine Use: 1/2 cup daily    FAMILY HISTORY: Family History  Problem Relation Age of Onset  . Hypertension Mother   . Thyroid disease Mother   . Breast cancer Mother   . Emphysema Father   . Diabetes Maternal Grandmother   . Heart disease Paternal Grandmother   . Alcohol abuse Paternal Grandfather     ALLERGIES:  is allergic to codeine; percocet [oxycodone-acetaminophen]; and percodan [oxycodone-aspirin].  MEDICATIONS:  Current Outpatient Prescriptions  Medication Sig Dispense Refill  . acetaminophen (TYLENOL) 500 MG tablet Take 500 mg by mouth 2 (two) times daily as needed for moderate pain (pain). Reported on 10/12/2015    . buPROPion (WELLBUTRIN XL) 300 MG 24 hr tablet Take 300 mg by mouth daily.   1  . Esomeprazole Magnesium (NEXIUM PO) Take 22.3 mg by mouth every other day.    . sertraline (ZOLOFT) 100 MG tablet Take 100 mg by mouth daily.     . Cholecalciferol (VITAMIN D3) 1000 units CAPS Take  1,000 Units by mouth daily.    . Cyanocobalamin (VITAMIN B-12 SL) Place 1 tablet under the tongue daily.    . diazepam (VALIUM) 5 MG tablet Take 2.5 mg by mouth daily as needed for anxiety.     . Multiple Vitamin (MULTIVITAMIN WITH MINERALS) TABS tablet Take 1 tablet by mouth at bedtime.    . traZODone (DESYREL) 50 MG tablet TAKE 1 TO 1/2 TABLET BY MOUTH AT BEDTIME  1   No current facility-administered medications for this visit.     REVIEW OF SYSTEMS:   Constitutional: Denies fevers, chills or abnormal night sweats (+) Weight gain of 20 lbs since 06/26/2016 (+) Feeling mentally tired Eyes: Denies blurriness of vision, double vision or watery eyes Ears, nose, mouth, throat, and face: Denies mucositis or sore throat Respiratory: Denies cough, dyspnea or wheezes Cardiovascular: Denies palpitation, chest discomfort or lower extremity swelling Gastrointestinal:  Denies nausea, heartburn or change in bowel habits Skin: Denies abnormal skin rashes Lymphatics: Denies new lymphadenopathy or easy bruising Musculoskeletal: (+) Breast pain at surgical site with strenuous activity, managed with Tylenol Neurological:Denies numbness, tingling or new weaknesses Behavioral/Psych: (+) Feeling down since last Friday All other systems were reviewed with the patient and are negative.  PHYSICAL EXAMINATION: ECOG PERFORMANCE STATUS: 1 - Symptomatic but completely ambulatory  Vitals:   04/01/17 1538  BP: (!) 142/69  Pulse: 80  Resp: 20  Temp: 98.7 F (37.1 C)  SpO2: 98%   Filed Weights   04/01/17 1538  Weight: 175 lb 9.6 oz (79.7 kg)    GENERAL:alert, no distress and comfortable SKIN: skin color, texture, turgor are normal, no rashes or significant lesions EYES: normal, conjunctiva are pink and non-injected, sclera clear OROPHARYNX:no exudate, no erythema and lips, buccal mucosa, and tongue normal  NECK: supple, thyroid normal size, non-tender, without nodularity LYMPH:  no palpable  lymphadenopathy in the cervical, axillary or inguinal LUNGS: clear to auscultation and percussion with normal breathing effort HEART: regular rate & rhythm and no murmurs and no lower extremity edema ABDOMEN:abdomen soft, non-tender and normal bowel sounds  Musculoskeletal:no cyanosis of digits and no clubbing  PSYCH: alert & oriented x 3 with fluent speech NEURO: no focal motor/sensory deficits  Breasts: Breast inspection showed them to be symmetrical with no nipple discharge. Right breast incision site has healed well. Palpation of the left breast and axilla revealed no obvious mass that I could appreciate.   LABORATORY DATA:  I have reviewed the data as listed CBC Latest Ref Rng & Units 04/01/2017 06/26/2016 04/22/2015  WBC 3.9 - 10.3 10e3/uL 8.5 9.9 10.2  Hemoglobin 11.6 - 15.9 g/dL 12.2 12.6 12.8  Hematocrit 34.8 - 46.6 % 38.0 38.2 39.5  Platelets 145 - 400 10e3/uL 326 395 363   CMP Latest Ref Rng & Units 04/01/2017 06/26/2016 04/22/2015  Glucose 70 - 140 mg/dl 120 107 106(H)  BUN 7.0 - 26.0 mg/dL 13.6 10.7 16  Creatinine 0.6 - 1.1 mg/dL 0.8 0.8 0.87  Sodium 136 - 145 mEq/L 141 142 140  Potassium 3.5 - 5.1 mEq/L 3.8 4.4 3.9  Chloride 101 - 111 mmol/L - - 108  CO2 22 - 29 mEq/L '25 22 23  '$ Calcium 8.4 - 10.4 mg/dL 9.5 10.2 9.1  Total Protein 6.4 - 8.3 g/dL 7.3 7.7 7.0  Total Bilirubin 0.20 - 1.20 mg/dL <0.22 0.26 0.3  Alkaline Phos 40 - 150 U/L 93 109 78  AST 5 - 34 U/L '16 15 20  '$ ALT 0 - 55 U/L '21 17 19     '$ PATHOLOGY REPORT  Diagnosis 04/07/2015 1. Breast, right, needle core biopsy, upper midline - FIBROCYTIC CHANGES WITH FOCAL ATYPICAL DUCTAL HYPERPLASIA. - FIBROADENOMA. 2. Breast, right, needle core biopsy, upper outer - INVASIVE DUCTAL CARCINOMA. - DUCTAL CARCINOMA IN SITU. Microscopic Comment 1. There is focal atypical ductal hyperplasia associated with fibrocystic changes and a fibroadenoma. 2. The findings are consistent with grade 1 invasive ductal carcinoma. Breast  prognostic profile will be performed.  2. PROGNOSTIC INDICATORS Results: IMMUNOHISTOCHEMICAL AND MORPHOMETRIC ANALYSIS PERFORMED MANUALLY Estrogen Receptor: 100%, POSITIVE, STRONG STAINING INTENSITY Progesterone Receptor: 80%, POSITIVE, STONG STAINING INTENSITY Proliferation Marker Ki67: 5% Results: HER2 - NEGATIVE RATIO OF HER2/CEP17 SIGNALS 1.25 AVERAGE HER2 COPY NUMBER PER CELL 2.00  Diagnosis 04/27/2015 1. Breast, lumpectomy, right upper outer quadrant - INVASIVE DUCTAL CARCINOMA, 0.9 CM. - DUCTAL CARCINOMA IN SITU. - INVASIVE CARCINOMA, 0.1 CM FROM POSTERIOR MARGIN. - REMAINING MARGINS GREATER THAN 0.5 CM. - FIBROCYSTIC CHANGES WITH CALCIFICATIONS. - BIOPSY SITE REACTION. 2. Breast, lumpectomy, right - FIBROCYSTIC CHANGES WITH FLORID USUAL DUCTAL HYPERPLASIA. - NO RESIDUAL ATYPICAL DUCTAL HYPERPLASIA. - BIOPSY SITE REACTION. 3. Lymph node, sentinel, biopsy, right axillary #1 - ONE BENIGN LYMPH NODE (0/1). 4. Fatty tissue, right additional axillary - BENIGN ADIPOSE TISSUE AND SKELETAL MUSCLE. - NO EVIDENCE OF MALIGNANCY. Microscopic Comment 1. BREAST, INVASIVE TUMOR, WITH LYMPH NODES PRESENT Specimen, including laterality and lymph node sampling (sentinel, non-sentinel): Right breast and one sentinel lymph node. Procedure: Localized lumpectomy and sentinel lymph node biopsy. Histologic type: Ductal. Grade: 1 Tubule formation: 1 Nuclear pleomorphism: 1 Mitotic: 1 Tumor size (gross measurement): 0.9 cm Margins: Invasive, distance to closest margin: 0.1 cm from posterior margin. In-situ, distance to closest margin: 0.5 cm from posterior margin. If margin positive, focally or broadly: N/A Lymphovascular invasion: No Ductal carcinoma in situ: Present Grade: Low grade. Extensive intraductal component: No Lobular neoplasia: No Tumor focality: Unifocal. Treatment effect: No If present, treatment effect in breast tissue, lymph nodes or both: N/A Extent of tumor: Skin:  N/A Nipple: N/A Skeletal muscle: Free of tumor. Lymph nodes: Examined:  1 Sentinel 0 Non-sentinel 1 Total Lymph nodes with metastasis: 0 Isolated tumor cells (< 0.2 mm): 0 Micrometastasis: (> 0.2 mm and < 2.0 mm): 0 Macrometastasis: (> 2.0 mm): 0 Extracapsular extension: N/A Breast prognostic profile: Case # [id number] Estrogen receptor: 100%, strong staining. Progesterone receptor: 80%, strong staining. Her 2 neu: Negative. Ratio 1.25. Will be repeated on current specimen. Ki-67: 5% Non-neoplastic breast: Fibrocystic changes. TNM: pT1b, pN0, pMX Results: HER2 - NEGATIVE RATIO OF HER2/CEP17 SIGNALS 1.33 AVERAGE HER2 COPY NUMBER PER CELL 2.00  Oncotype Dx, recurrence score 15, which predicts 10 year distant recurrence risk of 10% with tamoxifen.  RADIOGRAPHIC STUDIES: I have personally reviewed the radiological images as listed and agreed with the findings in the report.  MM diag and US Breast Ltd Uni Left Inc Axilla 03/21/2017 Targeted ultrasound is performed, showing slightly smaller mixed echogenicity collection within the right breast at 11:30 o'clock 6 cm from nipple measuring 0.9 x 0.7 x 1.4 cm. This is probably a postsurgical seroma.  IMPRESSION: Probable benign findings.   ASSESSMENT & PLAN:  71 y.o.  Caucasian female, postmenopausal, was found to have a right breast cancer by screening mammogram.  1. Right breast invasive ductal carcinoma, pT1bN0M0, stage IA, grade 1, ER positive, PR positive, HER-2 negative, (+) DCIS and ADH -I previously reviewed her surgical path findings with her in great details. -She had stage I breast cancer, likely has been cured with complete surgical resection.  -I previously reviewed her Oncotype DX test result with her in details. She has low risk of score (15), which predicts 10 year distant risk of recurrence about 10% with tamoxifen. I did not recommend adjuvant chemotherapy -She declined adjuvant antiestrogen therapy -she  received adjuvant breast radiation -She will continue breast cancer surveillance, with annual screening mammogram, self-exam and follow-up with [place] every 6-12 months -Most recent mammogram on 03/21/2017 showed probable benign findings - mixed echogenicity collection within the right breast likely postsurgical seroma. Repeat mammogram scheduled for 07/22/2017. -She is clinically doing well, except worsening depression lately, exam was unremarkable, lab results reviewed with her, no clinical concern for recurrence. -I encouraged her to eat healthy and exercise regularly.  -I encouraged her to take calcium and vitamin D for bone health  2. Depression -she will continue medication and follow up with her psychiatrist -she has been feeling down over the past week. She plans to contact a counselor if this continues. She denies any suicidal  ideas.  3. Breast pain at surgical site -Pain associated with strenuous  -Pain managed with Tylenol -Encouraged patient to perform stretching exercises  Plan -she previously declined adjuvant antiestrogen therapy, continue surveillance -I'll see her back in 6 months with lab. -next f/u mammogram and [place] in 07/2017 is scheduled   All questions were answered. The patient knows to call the clinic with any problems, questions or concerns. I spent 20 minutes counseling the patient face to face. The total time spent in the appointment was 25 minutes and more than 50% was on counseling.  This document serves as a record of services personally performed by Jessica Merle, MD. It was created on her behalf by Arlyce Harman, a trained medical scribe. The creation of this record is based on the scribe's personal observations and the provider's statements to them. This document has been checked and approved by the attending provider.     Jessica Merle, MD 04/01/2017

## 2017-04-01 ENCOUNTER — Ambulatory Visit (HOSPITAL_BASED_OUTPATIENT_CLINIC_OR_DEPARTMENT_OTHER): Payer: Medicare Other | Admitting: Hematology

## 2017-04-01 ENCOUNTER — Telehealth: Payer: Self-pay | Admitting: Hematology

## 2017-04-01 ENCOUNTER — Other Ambulatory Visit (HOSPITAL_BASED_OUTPATIENT_CLINIC_OR_DEPARTMENT_OTHER): Payer: Medicare Other

## 2017-04-01 VITALS — BP 142/69 | HR 80 | Temp 98.7°F | Resp 20 | Ht 66.0 in | Wt 175.6 lb

## 2017-04-01 DIAGNOSIS — C50411 Malignant neoplasm of upper-outer quadrant of right female breast: Secondary | ICD-10-CM

## 2017-04-01 DIAGNOSIS — Z170 Estrogen receptor positive status [ER+]: Secondary | ICD-10-CM

## 2017-04-01 DIAGNOSIS — Z853 Personal history of malignant neoplasm of breast: Secondary | ICD-10-CM | POA: Diagnosis not present

## 2017-04-01 DIAGNOSIS — E2839 Other primary ovarian failure: Secondary | ICD-10-CM | POA: Diagnosis not present

## 2017-04-01 LAB — CBC WITH DIFFERENTIAL/PLATELET
BASO%: 1.1 % (ref 0.0–2.0)
BASOS ABS: 0.1 10*3/uL (ref 0.0–0.1)
EOS%: 4.7 % (ref 0.0–7.0)
Eosinophils Absolute: 0.4 10*3/uL (ref 0.0–0.5)
HEMATOCRIT: 38 % (ref 34.8–46.6)
HGB: 12.2 g/dL (ref 11.6–15.9)
LYMPH#: 2.1 10*3/uL (ref 0.9–3.3)
LYMPH%: 24.2 % (ref 14.0–49.7)
MCH: 28.9 pg (ref 25.1–34.0)
MCHC: 32.1 g/dL (ref 31.5–36.0)
MCV: 90 fL (ref 79.5–101.0)
MONO#: 0.7 10*3/uL (ref 0.1–0.9)
MONO%: 8.5 % (ref 0.0–14.0)
NEUT#: 5.3 10*3/uL (ref 1.5–6.5)
NEUT%: 61.5 % (ref 38.4–76.8)
PLATELETS: 326 10*3/uL (ref 145–400)
RBC: 4.22 10*6/uL (ref 3.70–5.45)
RDW: 14.7 % — ABNORMAL HIGH (ref 11.2–14.5)
WBC: 8.5 10*3/uL (ref 3.9–10.3)

## 2017-04-01 LAB — COMPREHENSIVE METABOLIC PANEL
ALT: 21 U/L (ref 0–55)
ANION GAP: 7 meq/L (ref 3–11)
AST: 16 U/L (ref 5–34)
Albumin: 3.5 g/dL (ref 3.5–5.0)
Alkaline Phosphatase: 93 U/L (ref 40–150)
BUN: 13.6 mg/dL (ref 7.0–26.0)
CALCIUM: 9.5 mg/dL (ref 8.4–10.4)
CHLORIDE: 109 meq/L (ref 98–109)
CO2: 25 mEq/L (ref 22–29)
Creatinine: 0.8 mg/dL (ref 0.6–1.1)
EGFR: 71 mL/min/{1.73_m2} — AB (ref >90)
Glucose: 120 mg/dl (ref 70–140)
POTASSIUM: 3.8 meq/L (ref 3.5–5.1)
Sodium: 141 mEq/L (ref 136–145)
Total Bilirubin: <0.22 mg/dL (ref 0.20–1.20)
Total Protein: 7.3 g/dL (ref 6.4–8.3)

## 2017-04-01 NOTE — Telephone Encounter (Signed)
Scheduled appt per 8/20 los - gave patient AVS and calender per los.

## 2017-04-02 LAB — VITAMIN D 25 HYDROXY (VIT D DEFICIENCY, FRACTURES): VIT D 25 HYDROXY: 21.6 ng/mL — AB (ref 30.0–100.0)

## 2017-04-04 DIAGNOSIS — L720 Epidermal cyst: Secondary | ICD-10-CM | POA: Diagnosis not present

## 2017-04-06 ENCOUNTER — Encounter: Payer: Self-pay | Admitting: Hematology

## 2017-04-09 ENCOUNTER — Telehealth: Payer: Self-pay | Admitting: *Deleted

## 2017-04-09 NOTE — Telephone Encounter (Signed)
-----   Message from Truitt Merle, MD sent at 04/07/2017 10:47 PM EDT ----- Please let pt know that her Vit D level is mildly low. If she is not taking VitD, please start taking 1000u daily, if she is taking already, please double the dose.Thanks  Truitt Merle  04/07/2017

## 2017-04-09 NOTE — Telephone Encounter (Signed)
Telephone call to advise lab results as directed below. Patient states in voicemail not to leave a message as she does not check messages. Will attempt to reach patient at another time.

## 2017-04-10 ENCOUNTER — Telehealth: Payer: Self-pay | Admitting: *Deleted

## 2017-04-10 NOTE — Telephone Encounter (Signed)
TCT patient. Spoke with her regarding Vit. D levels recently checked. Advised pt that the level is mildly low and for her to start taking 1000u of Vit D daily. Pt states she has some and she will start taking in the morning. She voiced understanding. No other questions or concerns.

## 2017-07-05 DIAGNOSIS — M7121 Synovial cyst of popliteal space [Baker], right knee: Secondary | ICD-10-CM | POA: Diagnosis not present

## 2017-07-22 ENCOUNTER — Other Ambulatory Visit: Payer: Medicare Other

## 2017-07-29 DIAGNOSIS — R1030 Lower abdominal pain, unspecified: Secondary | ICD-10-CM | POA: Diagnosis not present

## 2017-07-29 DIAGNOSIS — Z0000 Encounter for general adult medical examination without abnormal findings: Secondary | ICD-10-CM | POA: Diagnosis not present

## 2017-07-29 DIAGNOSIS — K5900 Constipation, unspecified: Secondary | ICD-10-CM | POA: Diagnosis not present

## 2017-07-29 DIAGNOSIS — Z23 Encounter for immunization: Secondary | ICD-10-CM | POA: Diagnosis not present

## 2017-07-30 ENCOUNTER — Other Ambulatory Visit: Payer: Self-pay

## 2017-07-30 ENCOUNTER — Other Ambulatory Visit: Payer: Self-pay | Admitting: General Surgery

## 2017-07-30 DIAGNOSIS — N6489 Other specified disorders of breast: Secondary | ICD-10-CM

## 2017-07-30 DIAGNOSIS — T888XXD Other specified complications of surgical and medical care, not elsewhere classified, subsequent encounter: Secondary | ICD-10-CM

## 2017-07-31 ENCOUNTER — Ambulatory Visit
Admission: RE | Admit: 2017-07-31 | Discharge: 2017-07-31 | Disposition: A | Discharge status: Home / Self Care | Payer: Medicare Other | Source: Ambulatory Visit | Attending: General Surgery | Admitting: General Surgery

## 2017-07-31 ENCOUNTER — Ambulatory Visit: Payer: Medicare Other

## 2017-07-31 ENCOUNTER — Other Ambulatory Visit: Payer: Self-pay | Admitting: General Surgery

## 2017-07-31 DIAGNOSIS — R921 Mammographic calcification found on diagnostic imaging of breast: Secondary | ICD-10-CM | POA: Diagnosis not present

## 2017-07-31 DIAGNOSIS — N6489 Other specified disorders of breast: Secondary | ICD-10-CM

## 2017-07-31 DIAGNOSIS — T888XXD Other specified complications of surgical and medical care, not elsewhere classified, subsequent encounter: Secondary | ICD-10-CM

## 2017-07-31 DIAGNOSIS — R928 Other abnormal and inconclusive findings on diagnostic imaging of breast: Secondary | ICD-10-CM

## 2017-08-07 ENCOUNTER — Ambulatory Visit
Admission: RE | Admit: 2017-08-07 | Discharge: 2017-08-07 | Disposition: A | Discharge status: Home / Self Care | Payer: Medicare Other | Source: Ambulatory Visit | Attending: General Surgery | Admitting: General Surgery

## 2017-08-07 ENCOUNTER — Other Ambulatory Visit: Payer: Self-pay | Admitting: General Surgery

## 2017-08-07 DIAGNOSIS — R921 Mammographic calcification found on diagnostic imaging of breast: Secondary | ICD-10-CM

## 2017-08-07 DIAGNOSIS — T888XXD Other specified complications of surgical and medical care, not elsewhere classified, subsequent encounter: Secondary | ICD-10-CM

## 2017-08-07 DIAGNOSIS — N6489 Other specified disorders of breast: Secondary | ICD-10-CM

## 2017-08-07 DIAGNOSIS — N6002 Solitary cyst of left breast: Secondary | ICD-10-CM

## 2017-08-07 DIAGNOSIS — R928 Other abnormal and inconclusive findings on diagnostic imaging of breast: Secondary | ICD-10-CM

## 2017-08-07 DIAGNOSIS — N6322 Unspecified lump in the left breast, upper inner quadrant: Secondary | ICD-10-CM | POA: Diagnosis not present

## 2017-08-29 DIAGNOSIS — M8588 Other specified disorders of bone density and structure, other site: Secondary | ICD-10-CM | POA: Diagnosis not present

## 2017-09-18 DIAGNOSIS — R0683 Snoring: Secondary | ICD-10-CM | POA: Diagnosis not present

## 2017-09-18 DIAGNOSIS — G478 Other sleep disorders: Secondary | ICD-10-CM | POA: Diagnosis not present

## 2017-09-18 DIAGNOSIS — G4719 Other hypersomnia: Secondary | ICD-10-CM | POA: Diagnosis not present

## 2017-10-02 ENCOUNTER — Other Ambulatory Visit: Payer: Medicare Other

## 2017-10-02 ENCOUNTER — Ambulatory Visit: Payer: Medicare Other | Admitting: Hematology

## 2017-10-03 ENCOUNTER — Telehealth: Payer: Self-pay | Admitting: Hematology

## 2017-10-03 NOTE — Telephone Encounter (Signed)
Tried to call patient and voicemail stated DO NO LEAVE MSG BECAUSE SHE DOES MPT CHECK IT.  Letter mailed to patient per 2/20 sch msg

## 2017-10-21 DIAGNOSIS — G4733 Obstructive sleep apnea (adult) (pediatric): Secondary | ICD-10-CM | POA: Diagnosis not present

## 2017-10-29 DIAGNOSIS — F324 Major depressive disorder, single episode, in partial remission: Secondary | ICD-10-CM | POA: Diagnosis not present

## 2017-10-30 DIAGNOSIS — G4733 Obstructive sleep apnea (adult) (pediatric): Secondary | ICD-10-CM | POA: Diagnosis not present

## 2017-11-15 DIAGNOSIS — R05 Cough: Secondary | ICD-10-CM | POA: Diagnosis not present

## 2017-11-30 DIAGNOSIS — G4733 Obstructive sleep apnea (adult) (pediatric): Secondary | ICD-10-CM | POA: Diagnosis not present

## 2017-12-18 ENCOUNTER — Telehealth: Payer: Self-pay

## 2017-12-18 NOTE — Telephone Encounter (Signed)
Per patient request to change appt. Time due to funeral. Per 5/8 phone message return  calls

## 2017-12-18 NOTE — Telephone Encounter (Signed)
Patient returned call and is aware of the changes and will be expecting a letter in the mail with a calender also. Per 5/8 phone que

## 2017-12-20 ENCOUNTER — Inpatient Hospital Stay: Payer: Medicare Other | Admitting: Hematology

## 2017-12-20 ENCOUNTER — Inpatient Hospital Stay: Payer: Medicare Other

## 2017-12-25 ENCOUNTER — Telehealth: Payer: Self-pay | Admitting: Hematology

## 2017-12-25 ENCOUNTER — Telehealth: Payer: Self-pay

## 2017-12-25 NOTE — Telephone Encounter (Signed)
Patient needed to r/s due to weekly meetings on her job on Wednesday's. Per 5/15 walk in

## 2017-12-25 NOTE — Telephone Encounter (Signed)
Patient called to check appointment time and date.

## 2017-12-30 DIAGNOSIS — F324 Major depressive disorder, single episode, in partial remission: Secondary | ICD-10-CM | POA: Diagnosis not present

## 2017-12-30 DIAGNOSIS — G4733 Obstructive sleep apnea (adult) (pediatric): Secondary | ICD-10-CM | POA: Diagnosis not present

## 2018-01-01 ENCOUNTER — Other Ambulatory Visit: Payer: Medicare Other

## 2018-01-01 ENCOUNTER — Ambulatory Visit: Payer: Medicare Other | Admitting: Hematology

## 2018-01-01 DIAGNOSIS — J309 Allergic rhinitis, unspecified: Secondary | ICD-10-CM | POA: Diagnosis not present

## 2018-01-01 DIAGNOSIS — J209 Acute bronchitis, unspecified: Secondary | ICD-10-CM | POA: Diagnosis not present

## 2018-01-03 ENCOUNTER — Inpatient Hospital Stay: Payer: Medicare Other | Admitting: Nurse Practitioner

## 2018-01-03 ENCOUNTER — Encounter: Payer: Self-pay | Admitting: Nurse Practitioner

## 2018-01-03 ENCOUNTER — Inpatient Hospital Stay: Payer: Medicare Other | Attending: Hematology

## 2018-01-03 ENCOUNTER — Telehealth: Payer: Self-pay

## 2018-01-03 VITALS — BP 130/73 | HR 73 | Temp 97.9°F | Resp 18 | Ht 66.0 in | Wt 183.1 lb

## 2018-01-03 DIAGNOSIS — R7989 Other specified abnormal findings of blood chemistry: Secondary | ICD-10-CM

## 2018-01-03 DIAGNOSIS — Z853 Personal history of malignant neoplasm of breast: Secondary | ICD-10-CM

## 2018-01-03 DIAGNOSIS — Z170 Estrogen receptor positive status [ER+]: Secondary | ICD-10-CM

## 2018-01-03 DIAGNOSIS — C50411 Malignant neoplasm of upper-outer quadrant of right female breast: Secondary | ICD-10-CM

## 2018-01-03 LAB — CBC WITH DIFFERENTIAL/PLATELET
Basophils Absolute: 0.1 10*3/uL (ref 0.0–0.1)
Basophils Relative: 1 %
EOS PCT: 5 %
Eosinophils Absolute: 0.4 10*3/uL (ref 0.0–0.5)
HEMATOCRIT: 36.2 % (ref 34.8–46.6)
Hemoglobin: 11.1 g/dL — ABNORMAL LOW (ref 11.6–15.9)
LYMPHS ABS: 2 10*3/uL (ref 0.9–3.3)
Lymphocytes Relative: 23 %
MCH: 26.9 pg (ref 25.1–34.0)
MCHC: 30.7 g/dL — ABNORMAL LOW (ref 31.5–36.0)
MCV: 87.9 fL (ref 79.5–101.0)
MONO ABS: 0.6 10*3/uL (ref 0.1–0.9)
Monocytes Relative: 7 %
NEUTROS ABS: 5.6 10*3/uL (ref 1.5–6.5)
Neutrophils Relative %: 64 %
PLATELETS: 398 10*3/uL (ref 145–400)
RBC: 4.12 MIL/uL (ref 3.70–5.45)
RDW: 15.2 % — AB (ref 11.2–14.5)
WBC: 8.7 10*3/uL (ref 3.9–10.3)

## 2018-01-03 LAB — COMPREHENSIVE METABOLIC PANEL
ALBUMIN: 3.6 g/dL (ref 3.5–5.0)
ALT: 15 U/L (ref 0–55)
ANION GAP: 9 (ref 3–11)
AST: 14 U/L (ref 5–34)
Alkaline Phosphatase: 109 U/L (ref 40–150)
BUN: 14 mg/dL (ref 7–26)
CHLORIDE: 107 mmol/L (ref 98–109)
CO2: 23 mmol/L (ref 22–29)
Calcium: 9.4 mg/dL (ref 8.4–10.4)
Creatinine, Ser: 0.76 mg/dL (ref 0.60–1.10)
GFR calc Af Amer: >60 mL/min (ref >60)
GFR calc non Af Amer: >60 mL/min (ref >60)
GLUCOSE: 112 mg/dL (ref 70–140)
Potassium: 4.3 mmol/L (ref 3.5–5.1)
SODIUM: 139 mmol/L (ref 136–145)
Total Bilirubin: 0.4 mg/dL (ref 0.2–1.2)
Total Protein: 7.4 g/dL (ref 6.4–8.3)

## 2018-01-03 NOTE — Telephone Encounter (Signed)
Printed avs and calender of upcoming appointment. Per 5/24 los 

## 2018-01-03 NOTE — Progress Notes (Signed)
[place]  [phone]) [phone] [phone]) [phone]  Clinic Follow up Note   Patient Care Team: Cari Caraway, MD as PCP - General (Family Medicine) Fanny Skates, MD as Consulting Physician (General Surgery) Truitt Merle, MD as Consulting Physician (Hematology) Thea Silversmith, MD as Consulting Physician (Radiation Oncology) Mauro Kaufmann, RN as Registered Nurse Rockwell Germany, RN as Registered Nurse Jake Shark, Johny Blamer, NP as Nurse Practitioner (Nurse Practitioner) 01/03/2018  SUMMARY OF ONCOLOGIC HISTORY: Oncology History   Cancer Staging Breast cancer of upper-outer quadrant of right female breast [facility]) Staging form: Breast, AJCC 7th Edition - Clinical stage from 04/07/2015: Stage IA (T1a, N0, M0) - Signed by Truitt Merle, MD on 05/20/2015 - Pathologic stage from 04/27/2015: Stage IA (T1b, N0, cM0) - Signed by Truitt Merle, MD on 05/20/2015         Breast cancer of upper-outer quadrant of right female breast ([place])   04/01/2015 Mammogram    Diagnostic mammogram and US showed a 0.7x 0.4 x 0.4 cm mass in the right breast 11:00 position, and a second 91m lesion at upper-midline, and a benign cluster of cysts measuring 1cm.       04/07/2015 Initial Biopsy    Right breast UOQ mass core needle biopsy showed invasive ductal carcinoma and DCIS, grade 1. ER+ (100%), PR+ (80%), HER2/neu negative (ratio 1.25), Ki67 5%.      04/07/2015 Clinical Stage    Stage IA: T1a N0      04/27/2015 Definitive Surgery    Right breast double lumpectomy/SLNB: IDC, 0.9 cm, DCIS, invasive dz 0.1 cm from posterior margin, rem. margins >0.5 cm; second lump: fibrocystic change. 1 LN negative for malignancy      04/27/2015 Pathologic Stage    Stage IA: T1b N0      06/21/2015 - 07/13/2015 Radiation Therapy    Adjuvant RT: Right breast 42.72 Gy over 21 fractions.        Anti-estrogen oral therapy    Pt declined adjuvant anti-estrogen therapy       09/29/2015 Survivorship   Survivorship visit completed and copy of care plan given to patient.      07/17/2016 Imaging    MM DIAG BREAST TOMO BILATERALL 07/17/16 MPRESSION: Probable mildly complicated fluid collection/seroma within the right breast 1130 o'clock along the posterior margin of the lumpectomy site.      09/04/2016 Imaging    [place] RIGHT [facility]: 09/04/16 IMPRESSION: Stable probably benign postsurgical fluid collection/seroma within the right breast at the 11:30 o'clock axis, 6 cm from the nipple, measuring 2.3 x 0.9 x 1.2 cm. Recommend additional follow-up right breast diagnostic mammogram and ultrasound in 6 months to ensure continued stability.      03/21/2017 Mammogram    R breast Mammogram and [place] Mammographic images were processed with CAD.  Targeted ultrasound is performed, showing slightly smaller mixed echogenicity collection within the right breast at 11:30 o'clock 6 cm from nipple measuring 0.9 x 0.7 x 1.4 cm. This is probably a postsurgical seroma.      07/31/2017 Mammogram    IMPRESSION: 1. Indeterminate 9 mm group of fine pleomorphic calcifications involving the upper outer quadrant of the right breast at posterior depth. 2. New partially obscured mass involving the inner left breast at anterior posterior depth.      08/07/2017 Breast [place]   Targeted right breast ultrasound is performed, showing the previously identified mildly complex fluid collection at the lumpectomy site at the 11:30 o'clock approximately 6 cm  from the nipple has decreased in size in the interval, currently measuring 6 x 8 x 8 mm (previously 7 x 9 by 14 mm). No new suspicious solid mass or abnormal acoustic shadowing is identified.  Targeted left breast ultrasound is performed, showing that the new mammographic mass corresponds to an oval circumscribed parallel hypoechoic mass at the 10 o'clock position approximately 4 cm from the nipple measuring approximately 5 x 4 x 5 mm, demonstrating slight  posterior acoustic enhancement and no internal power Doppler flow.  IMPRESSION: 1. Interval decrease in size of the postoperative seroma at the lumpectomy site in the upper outer quadrant of the right breast. 2. Likely benign 5 mm complex cyst in the upper inner quadrant of the left breast accounting for the new mammographic finding.      08/07/2017 Procedure    aspiration of the complex cyst in the upper inner quadrant of the left breast at the 10 o'clock position approximately 4 cm from the nipple. Less than 1 cc of cyst fluid was aspirated. The cyst completely collapsed with aspiration and there is no associated solid component.      08/07/2017 Pathology Results    Diagnosis Breast, left, needle core biopsy, UIQ at posterior depth - BENIGN BREAST TISSUE WITH CALCIFICATIONS. - NO MALIGNANCY IDENTIFIED. - SEE COMMENT.      CURRENT THERAPY: surveillance   INTERVAL HISTORY: Ms. Jessica Roberts returns for 6 month follow up and breast cancer surveillance as scheduled. She is having a bad day today. She is fatigued due to being very active and stressed with family and life events. She has had 3 week history of respiratory illness with previously dry now productive cough with yellow sputum, she is on antibiotic, cough med, and flonase per PCP. Has DOE over the last 6 months. Denies current chest pain but has intermittent mild chest pain when she's "tired and anxious." she was diagnosed with sleep apnea and started using CPAP 6 weeks ago. Morning headaches have improved. She chronically alternates between constipation and diarrhea, no blood in stool. Has stable joint pain. She had a cyst drained from her left breast in 07/2017 and the seroma in right breast is improving; she follows up at the breast imaging center q6 months.   REVIEW OF SYSTEMS:   Constitutional: Denies fevers, chills or abnormal weight loss (+) fatigue, related to life stress and being busy  Ears, nose, mouth, throat, and face:  Denies mucositis or sore throat Respiratory: Denies wheezes (+) 3 week h/o productive cough, yellow sputum (+) DOE  Cardiovascular: Denies palpitation, chest discomfort or lower extremity swelling (+) intermittent chest pain when tired and anxious, none currently  Gastrointestinal:  Denies nausea, vomiting, hematochezia, heartburn or change in bowel habits (+) alternates with constipation and diarrhea  Skin: Denies abnormal skin rashes Lymphatics: Denies new lymphadenopathy or easy bruising Neurological:Denies numbness, tingling or new weaknesses Behavioral/Psych: Mood is stable, no new changes (+) anxious (+) tearful (+) "bad day" Breasts: Denies nipple inversion, discharge, or breast changes  All other systems were reviewed with the patient and are negative.  MEDICAL HISTORY:  Past Medical History:  Diagnosis Date  . Allergy   . Anxiety   . Anxiety and depression   . Breast cancer ([place]) 04/07/15   right breast  . Breast cancer of upper-outer quadrant of right female breast ([place]) 04/11/2015  . Broken nose   . Complication of anesthesia    "had a very hard time waking up"  . Dengue fever   .  Depression   . Fall    broken nose  . GERD (gastroesophageal reflux disease)   . Head injury 1995   concussion in [place], med evac to [place], ear damage with vertigo  . Head injury, acute, with loss of consciousness ([place]) 2000   [place] of the country  . Head injury, closed, with concussion 1986   [place]  . Headache   . Hypertension   . Malaria   . PONV (postoperative nausea and vomiting)   . Typhoid     SURGICAL HISTORY: Past Surgical History:  Procedure Laterality Date  . BREAST LUMPECTOMY Right 04/27/2015  . BREAST LUMPECTOMY WITH NEEDLE LOCALIZATION AND AXILLARY SENTINEL LYMPH NODE BX Right 04/27/2015   Procedure: RIGHT BREAST LUMPECTOMY WITH  TWO NEEDLE LOCALIZATION AND TWO RIGHT AXILLARY SENTINEL LYMPH NODE BX;  Surgeon: Fanny Skates, MD;  Location: [facility];  Service: General;   Laterality: Right;  . COLONOSCOPY W/ BIOPSIES AND POLYPECTOMY    . NERVE SURGERY     '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' during surgery for endometriosis  . surgical procedure for endometriosis    . TONSILLECTOMY      I have reviewed the social history and family history with the patient and they are unchanged from previous note.  ALLERGIES:  is allergic to codeine; percocet [oxycodone-acetaminophen]; and percodan [oxycodone-aspirin].  MEDICATIONS:  Current Outpatient Medications  Medication Sig Dispense Refill  . acetaminophen (TYLENOL) 500 MG tablet Take 500 mg by mouth 2 (two) times daily as needed for moderate pain (pain). Reported on 10/12/2015    . buPROPion (WELLBUTRIN XL) 300 MG 24 hr tablet Take 300 mg by mouth daily.   1  . diazepam (VALIUM) 5 MG tablet Take 2.5 mg by mouth daily as needed for anxiety.     . Esomeprazole Magnesium (NEXIUM PO) Take 22.3 mg by mouth daily.     . Multiple Vitamin (MULTIVITAMIN WITH MINERALS) TABS tablet Take 1 tablet by mouth at bedtime.    . sertraline (ZOLOFT) 100 MG tablet Take 100 mg by mouth daily.     . Cholecalciferol (VITAMIN D3) 1000 units CAPS Take 1,000 Units by mouth daily.     No current facility-administered medications for this visit.     PHYSICAL EXAMINATION: ECOG PERFORMANCE STATUS: 0 - Asymptomatic  Vitals:   01/03/18 1021  BP: 130/73  Pulse: 73  Resp: 18  Temp: 97.9 F (36.6 C)  SpO2: 99%   Filed Weights   01/03/18 1021  Weight: 183 lb 1.6 oz (83.1 kg)    GENERAL:alert, no distress and comfortable SKIN: no rashes or significant lesions EYES: normal, Conjunctiva are pink and non-injected, sclera clear OROPHARYNX:no thrush or ulcers  NECK: supple, thyroid normal size, non-tender, without nodularity LYMPH:  no palpable cervical, supraclavicular, or axillary lymphadenopathy  LUNGS: clear to auscultation with normal breathing effort HEART: regular rate & rhythm and no murmurs and no lower extremity  edema ABDOMEN:abdomen soft, non-tender and normal bowel sounds Musculoskeletal:no cyanosis of digits and no clubbing  NEURO: alert & oriented x 3 with fluent speech, no focal motor/sensory deficits BREASTS: (+) s/p right double lumpectomy. Surgical incisions to breast and axilla are well healed. No palpable mass in right breast or axilla that I could appreciate (+) left breast s/p aspiration of cystic fluid 07/2017; no evidence of reaccumulation, no palpable mass in left breast or axilla that I could appreciate.    LABORATORY DATA:  I have reviewed the data as listed CBC Latest Ref Rng & Units 01/03/2018  04/01/2017 06/26/2016  WBC 3.9 - 10.3 K/uL 8.7 8.5 9.9  Hemoglobin 11.6 - 15.9 g/dL 11.1(L) 12.2 12.6  Hematocrit 34.8 - 46.6 % 36.2 38.0 38.2  Platelets 145 - 400 K/uL 398 326 395     CMP Latest Ref Rng & Units 01/03/2018 04/01/2017 06/26/2016  Glucose 70 - 140 mg/dL 112 120 107  BUN 7 - 26 mg/dL 14 13.6 10.7  Creatinine 0.60 - 1.10 mg/dL 0.76 0.8 0.8  Sodium 136 - 145 mmol/L 139 141 142  Potassium 3.5 - 5.1 mmol/L 4.3 3.8 4.4  Chloride 98 - 109 mmol/L 107 - -  CO2 22 - 29 mmol/L _0 Calcium 8.4 - 10.4 mg/dL 9.4 9.5 10.2  Total Protein 6.4 - 8.3 g/dL 7.4 7.3 7.7  Total Bilirubin 0.2 - 1.2 mg/dL 0.4 <0.22 0.26  Alkaline Phos 40 - 150 U/L 109 93 109  AST 5 - 34 U/L _1 ALT 0 - 55 U/L _2 PATHOLOGY REPORT  Diagnosis 04/07/2015 1. Breast, right, needle core biopsy, upper midline - FIBROCYTIC CHANGES WITH FOCAL ATYPICAL DUCTAL HYPERPLASIA. - FIBROADENOMA. 2. Breast, right, needle core biopsy, upper outer - INVASIVE DUCTAL CARCINOMA. - DUCTAL CARCINOMA IN SITU. Microscopic Comment 1. There is focal atypical ductal hyperplasia associated with fibrocystic changes and a fibroadenoma. 2. The findings are consistent with grade 1 invasive ductal carcinoma. Breast prognostic profile will be performed.  2. PROGNOSTIC INDICATORS Results: IMMUNOHISTOCHEMICAL AND  MORPHOMETRIC ANALYSIS PERFORMED MANUALLY Estrogen Receptor: 100%, POSITIVE, STRONG STAINING INTENSITY Progesterone Receptor: 80%, POSITIVE, STONG STAINING INTENSITY Proliferation Marker Ki67: 5% Results: HER2 - NEGATIVE RATIO OF HER2/CEP17 SIGNALS 1.25 AVERAGE HER2 COPY NUMBER PER CELL 2.00  Diagnosis 04/27/2015 1. Breast, lumpectomy, right upper outer quadrant - INVASIVE DUCTAL CARCINOMA, 0.9 CM. - DUCTAL CARCINOMA IN SITU. - INVASIVE CARCINOMA, 0.1 CM FROM POSTERIOR MARGIN. - REMAINING MARGINS GREATER THAN 0.5 CM. - FIBROCYSTIC CHANGES WITH CALCIFICATIONS. - BIOPSY SITE REACTION. 2. Breast, lumpectomy, right - FIBROCYSTIC CHANGES WITH FLORID USUAL DUCTAL HYPERPLASIA. - NO RESIDUAL ATYPICAL DUCTAL HYPERPLASIA. - BIOPSY SITE REACTION. 3. Lymph node, sentinel, biopsy, right axillary #1 - ONE BENIGN LYMPH NODE (0/1). 4. Fatty tissue, right additional axillary - BENIGN ADIPOSE TISSUE AND SKELETAL MUSCLE. - NO EVIDENCE OF MALIGNANCY. Microscopic Comment 1. BREAST, INVASIVE TUMOR, WITH LYMPH NODES PRESENT Specimen, including laterality and lymph node sampling (sentinel, non-sentinel): Right breast and one sentinel lymph node. Procedure: Localized lumpectomy and sentinel lymph node biopsy. Histologic type: Ductal. Grade: 1 Tubule formation: 1 Nuclear pleomorphism: 1 Mitotic: 1 Tumor size (gross measurement): 0.9 cm Margins: Invasive, distance to closest margin: 0.1 cm from posterior margin. In-situ, distance to closest margin: 0.5 cm from posterior margin. If margin positive, focally or broadly: N/A Lymphovascular invasion: No Ductal carcinoma in situ: Present Grade: Low grade. Extensive intraductal component: No Lobular neoplasia: No Tumor focality: Unifocal. Treatment effect: No If present, treatment effect in breast tissue, lymph nodes or both: N/A Extent of tumor: Skin: N/A Nipple: N/A Skeletal muscle: Free of tumor. Lymph nodes: Examined: 1 Sentinel 0  Non-sentinel 1 Total Lymph nodes with metastasis: 0 Isolated tumor cells (< 0.2 mm): 0 Micrometastasis: (> 0.2 mm and < 2.0 mm): 0 Macrometastasis: (> 2.0 mm): 0 Extracapsular extension: N/A Breast prognostic profile: Case # [id number] Estrogen receptor: 100%, strong staining. Progesterone receptor: 80%, strong staining. Her 2 neu: Negative. Ratio 1.25. Will be repeated on current specimen. Ki-67: 5% Non-neoplastic breast: Fibrocystic changes. TNM: pT1b, pN0, pMX Results: HER2 -  NEGATIVE RATIO OF HER2/CEP17 SIGNALS 1.33 AVERAGE HER2 COPY NUMBER PER CELL 2.00  Oncotype Dx, recurrence score 15, which predicts 10 year distant recurrence risk of 10% with tamoxifen.    RADIOGRAPHIC STUDIES: I have personally reviewed the radiological images as listed and agreed with the findings in the report. No results found.   ASSESSMENT & PLAN: 72 y.o.  Caucasian female, postmenopausal, was found to have a right breast cancer by screening mammogram.  1. Right breast invasive ductal carcinoma, pT1bN0M0, stage IA, grade 1, ER positive, PR positive, HER-2 negative, (+) DCIS and ADH 2. Depression  3. Breast pain at surgical site  4. Respiratory illness  5. Anemia  6. Vitamin D deficiency   Ms. Whetsel appears stable. She is nearly 3 years out from her initial diagnosis. She has an improving respiratory illness, and otherwise she is clinically doing well from a breast cancer standpoint. She is monitored at the breast center q6 months for improving post-op seroma in the upper outer quadrant of right breast. Most recent imaging in 08/07/2017 identified a cystic mass in the left breast that was aspirated and negative for malignancy. Breast exam is normal today, no clinical concern for recurrence. Will continue breast cancer surveillance. Plan to f/u with Dr. Burr Medico in 6 months. She is due for repeat imaging next month but does not appear to be scheduled, she will call the breast center to clarify.    Labs reviewed. CMP is normal. Vitamin D was low in 03/2017 and she subsequently started oral vitamin D. She has been off it lately, I recommend she restart oral vitamin D 1000u daily supplement. Will recheck at next visit. Hgb slightly decreased to 11.1 on CBC. RDW 15.2. I recommend rechecking labs in 1-2 months here or with PCP, she prefers to come here for labs. I encouraged her to eat a well-rounded diet, include iron-rich foods, and exercise regularly. She agrees.   PLAN: -Labs and imaging reviewed, continue breast cancer surveillance -Lab, f/u with Dr. Burr Medico in 6 months  -Recheck CBC and add vitamin D, iron studies in 1-2 months  -Patient to call breast center for imaging appointment  -Restart vitamin D 1000u daily   All questions were answered. The patient knows to call the clinic with any problems, questions or concerns. No barriers to learning was detected.    Alla Feeling, NP 01/03/18

## 2018-01-13 DIAGNOSIS — G4733 Obstructive sleep apnea (adult) (pediatric): Secondary | ICD-10-CM | POA: Diagnosis not present

## 2018-02-17 ENCOUNTER — Inpatient Hospital Stay: Payer: Medicare Other | Attending: Hematology

## 2018-02-21 ENCOUNTER — Other Ambulatory Visit: Payer: Self-pay | Admitting: Hematology

## 2018-02-21 DIAGNOSIS — R921 Mammographic calcification found on diagnostic imaging of breast: Secondary | ICD-10-CM

## 2018-03-01 DIAGNOSIS — G4733 Obstructive sleep apnea (adult) (pediatric): Secondary | ICD-10-CM | POA: Diagnosis not present

## 2018-03-10 ENCOUNTER — Ambulatory Visit
Admission: RE | Admit: 2018-03-10 | Discharge: 2018-03-10 | Disposition: A | Discharge status: Home / Self Care | Payer: Medicare Other | Source: Ambulatory Visit | Attending: Hematology | Admitting: Hematology

## 2018-03-10 DIAGNOSIS — R921 Mammographic calcification found on diagnostic imaging of breast: Secondary | ICD-10-CM

## 2018-05-14 DIAGNOSIS — L821 Other seborrheic keratosis: Secondary | ICD-10-CM | POA: Diagnosis not present

## 2018-05-14 DIAGNOSIS — L570 Actinic keratosis: Secondary | ICD-10-CM | POA: Diagnosis not present

## 2018-05-14 DIAGNOSIS — C44311 Basal cell carcinoma of skin of nose: Secondary | ICD-10-CM | POA: Diagnosis not present

## 2018-05-14 DIAGNOSIS — D485 Neoplasm of uncertain behavior of skin: Secondary | ICD-10-CM | POA: Diagnosis not present

## 2018-05-14 DIAGNOSIS — L820 Inflamed seborrheic keratosis: Secondary | ICD-10-CM | POA: Diagnosis not present

## 2018-05-26 DIAGNOSIS — F324 Major depressive disorder, single episode, in partial remission: Secondary | ICD-10-CM | POA: Diagnosis not present

## 2018-06-02 DIAGNOSIS — G4733 Obstructive sleep apnea (adult) (pediatric): Secondary | ICD-10-CM | POA: Diagnosis not present

## 2018-07-01 ENCOUNTER — Telehealth: Payer: Self-pay

## 2018-07-01 NOTE — Telephone Encounter (Signed)
Spoke with patient concerning the rescheduling of her current schedule. She agreed on the new appointment date. Per 11/18 voice mail return calls. Mailed a letter with a calender enclosed

## 2018-07-03 DIAGNOSIS — Z85828 Personal history of other malignant neoplasm of skin: Secondary | ICD-10-CM | POA: Diagnosis not present

## 2018-07-03 DIAGNOSIS — C44311 Basal cell carcinoma of skin of nose: Secondary | ICD-10-CM | POA: Diagnosis not present

## 2018-07-04 ENCOUNTER — Other Ambulatory Visit: Payer: Medicare Other

## 2018-07-04 ENCOUNTER — Ambulatory Visit: Payer: Medicare Other | Admitting: Hematology

## 2018-07-15 NOTE — Progress Notes (Signed)
[place]  [phone] [phone]  Clinic Follow up Note   Patient Care Team: Cari Caraway, MD as PCP - General (Family Medicine) Fanny Skates, MD as Consulting Physician (General Surgery) Truitt Merle, MD as Consulting Physician (Hematology) Thea Silversmith, MD as Consulting Physician (Radiation Oncology) Mauro Kaufmann, RN as Registered Nurse Rockwell Germany, RN as Registered Nurse Sylvan Cheese, NP as Nurse Practitioner (Nurse Practitioner) 07/16/2018   Chief Complaint: F/u on breast cancer  SUMMARY OF ONCOLOGIC HISTORY: Oncology History   Cancer Staging Breast cancer of upper-outer quadrant of right female breast [facility]) Staging form: Breast, AJCC 7th Edition - Clinical stage from 04/07/2015: Stage IA (T1a, N0, M0) - Signed by Truitt Merle, MD on 05/20/2015 - Pathologic stage from 04/27/2015: Stage IA (T1b, N0, cM0) - Signed by Truitt Merle, MD on 05/20/2015         Breast cancer of upper-outer quadrant of right female breast ([place])   04/01/2015 Mammogram    Diagnostic mammogram and US showed a 0.7x 0.4 x 0.4 cm mass in the right breast 11:00 position, and a second 13m lesion at upper-midline, and a benign cluster of cysts measuring 1cm.     04/07/2015 Initial Biopsy    Right breast UOQ mass core needle biopsy showed invasive ductal carcinoma and DCIS, grade 1. ER+ (100%), PR+ (80%), HER2/neu negative (ratio 1.25), Ki67 5%.    04/07/2015 Clinical Stage    Stage IA: T1a N0    04/27/2015 Definitive Surgery    Right breast double lumpectomy/SLNB: IDC, 0.9 cm, DCIS, invasive dz 0.1 cm from posterior margin, rem. margins >0.5 cm; second lump: fibrocystic change. 1 LN negative for malignancy    04/27/2015 Pathologic Stage    Stage IA: T1b N0    06/21/2015 - 07/13/2015 Radiation Therapy    Adjuvant RT: Right breast 42.72 Gy over 21 fractions.      Anti-estrogen oral therapy    Pt declined adjuvant anti-estrogen therapy     09/29/2015 Survivorship    Survivorship visit completed and copy of care plan given to patient.    07/17/2016 Imaging    MM DIAG BREAST TOMO BILATERALL 07/17/16 MPRESSION: Probable mildly complicated fluid collection/seroma within the right breast 1130 o'clock along the posterior margin of the lumpectomy site.    09/04/2016 Imaging    [place] UN[place] RIGHT INC AXILLA: 09/04/16 IMPRESSION: Stable probably benign postsurgical fluid collection/seroma within the right breast at the 11:30 o'clock axis, 6 cm from the nipple, measuring 2.3 x 0.9 x 1.2 cm. Recommend additional follow-up right breast diagnostic mammogram and ultrasound in 6 months to ensure continued stability.    03/21/2017 Mammogram    R breast Mammogram and [place] Mammographic images were processed with CAD.  Targeted ultrasound is performed, showing slightly smaller mixed echogenicity collection within the right breast at 11:30 o'clock 6 cm from nipple measuring 0.9 x 0.7 x 1.4 cm. This is probably a postsurgical seroma.    07/31/2017 Mammogram    IMPRESSION: 1. Indeterminate 9 mm group of fine pleomorphic calcifications involving the upper outer quadrant of the right breast at posterior depth. 2. New partially obscured mass involving the inner left breast at anterior posterior depth.    08/07/2017 Breast [place]   Targeted right breast ultrasound is performed, showing the previously identified mildly complex fluid collection at the lumpectomy site at the 11:30 o'clock approximately 6 cm from the nipple has decreased in size in the interval, currently measuring 6 x 8  x 8 mm (previously 7 x 9 by 14 mm). No new suspicious solid mass or abnormal acoustic shadowing is identified.  Targeted left breast ultrasound is performed, showing that the new mammographic mass corresponds to an oval circumscribed parallel hypoechoic mass at the 10 o'clock position approximately 4 cm from the nipple measuring approximately 5 x 4 x 5 mm,  demonstrating slight posterior acoustic enhancement and no internal power Doppler flow.  IMPRESSION: 1. Interval decrease in size of the postoperative seroma at the lumpectomy site in the upper outer quadrant of the right breast. 2. Likely benign 5 mm complex cyst in the upper inner quadrant of the left breast accounting for the new mammographic finding.    08/07/2017 Procedure    aspiration of the complex cyst in the upper inner quadrant of the left breast at the 10 o'clock position approximately 4 cm from the nipple. Less than 1 cc of cyst fluid was aspirated. The cyst completely collapsed with aspiration and there is no associated solid component.    08/07/2017 Pathology Results    Diagnosis Breast, left, needle core biopsy, UIQ at posterior depth - BENIGN BREAST TISSUE WITH CALCIFICATIONS. - NO MALIGNANCY IDENTIFIED. - SEE COMMENT.     03/10/2018 Mammogram     03/10/2018 Mammogram IMPRESSION: No mammographic evidence of malignancy involving the LEFT breast.    CURRENT THERAPY Surveillance   INTERVAL HISTORY: Jessica Montfort is a 72 y.o. female who is here for follow-up. 2019 mammogram was benign. She is here alone. She is doing well. She is gaining and weight and is not exercising. She is trying to find the motivation the exercise, but was not successful. She plans to attend a gym with a trainer. She also wants to practice breathing exercising. She experiences occasional shooting pain on her right breast at the site of surgery. No ROM restriction. Her energy level is good. Her BM is regular without blood or black stools. Last colonoscopy was 2-3 years ago before her cancer diagnosis.    Pertinent positives and negatives of review of systems are listed and detailed within the above HPI.   REVIEW OF SYSTEMS:   Constitutional: Denies fevers, chills or abnormal weight loss (+) weight gain Eyes: Denies blurriness of vision Ears, nose, mouth, throat, and face: Denies mucositis or  sore throat Respiratory: Denies cough, dyspnea or wheezes Cardiovascular: Denies palpitation, chest discomfort or lower extremity swelling Gastrointestinal:  Denies nausea, heartburn or change in bowel habits Skin: Denies abnormal skin rashes Lymphatics: Denies new lymphadenopathy or easy bruising Neurological:Denies numbness, tingling or new weaknesses Behavioral/Psych: Mood is stable, no new changes  Breast: (+) occasional shooting pain at surgical site All other systems were reviewed with the patient and are negative.  MEDICAL HISTORY:  Past Medical History:  Diagnosis Date  . Allergy   . Anxiety   . Anxiety and depression   . Breast cancer ([place]) 04/07/15   right breast  . Breast cancer of upper-outer quadrant of right female breast ([place]) 04/11/2015  . Broken nose   . Complication of anesthesia    "had a very hard time waking up"  . Dengue fever   . Depression   . Fall    broken nose  . GERD (gastroesophageal reflux disease)   . Head injury 1995   concussion in [place], med evac to [place], ear damage with vertigo  . Head injury, acute, with loss of consciousness ([place]) 2000   [place] the [place]  . Head [place] closed, with concussion 1986  [place]  . Headache   . Hypertension   . Malaria   . PONV (postoperative nausea and vomiting)   . Typhoid     SURGICAL HISTORY: Past Surgical History:  Procedure Laterality Date  . BREAST LUMPECTOMY Right 04/27/2015  . BREAST LUMPECTOMY WITH NEEDLE LOCALIZATION AND AXILLARY SENTINEL LYMPH NODE BX Right 04/27/2015   Procedure: RIGHT BREAST LUMPECTOMY WITH  TWO NEEDLE LOCALIZATION AND TWO RIGHT AXILLARY SENTINEL LYMPH NODE BX;  Surgeon: Fanny Skates, MD;  Location: [facility];  Service: General;  Laterality: Right;  . COLONOSCOPY W/ BIOPSIES AND POLYPECTOMY    . NERVE SURGERY     '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' during surgery for endometriosis  . surgical procedure for endometriosis    . TONSILLECTOMY      I have reviewed the  social history and family history with the patient and they are unchanged from previous note.  ALLERGIES:  is allergic to codeine; percocet [oxycodone-acetaminophen]; and percodan [oxycodone-aspirin].  MEDICATIONS:  Current Outpatient Medications  Medication Sig Dispense Refill  . acetaminophen (TYLENOL) 500 MG tablet Take 500 mg by mouth 2 (two) times daily as needed for moderate pain (pain). Reported on 10/12/2015    . albuterol (PROVENTIL HFA;VENTOLIN HFA) 108 (90 Base) MCG/ACT inhaler Inhale 1 puff into the lungs every 6 (six) hours as needed for wheezing or shortness of breath.    Marland Kitchen buPROPion (WELLBUTRIN XL) 300 MG 24 hr tablet Take 300 mg by mouth daily.   1  . diazepam (VALIUM) 5 MG tablet Take 2.5 mg by mouth daily as needed for anxiety.     . Esomeprazole Magnesium (NEXIUM PO) Take 22.3 mg by mouth daily.     . sertraline (ZOLOFT) 100 MG tablet Take 100 mg by mouth daily.     . Multiple Vitamin (MULTIVITAMIN WITH MINERALS) TABS tablet Take 1 tablet by mouth at bedtime.     No current facility-administered medications for this visit.     PHYSICAL EXAMINATION: ECOG PERFORMANCE STATUS: 0 - Asymptomatic  Vitals:   07/16/18 0918  BP: (!) 141/64  Pulse: 90  Resp: 18  Temp: 98 F (36.7 C)  SpO2: 97%   Filed Weights   07/16/18 0918  Weight: 183 lb 8 oz (83.2 kg)    GENERAL:alert, no distress and comfortable SKIN: skin color, texture, turgor are normal, no rashes or significant lesions EYES: normal, Conjunctiva are pink and non-injected, sclera clear OROPHARYNX:no exudate, no erythema and lips, buccal mucosa, and tongue normal  NECK: supple, thyroid normal size, non-tender, without nodularity LYMPH:  no palpable lymphadenopathy in the cervical, axillary or inguinal LUNGS: clear to auscultation and percussion with normal breathing effort HEART: regular rate & rhythm and no murmurs and no lower extremity edema ABDOMEN:abdomen soft, non-tender and normal bowel  sounds Musculoskeletal:no cyanosis of digits and no clubbing  NEURO: alert & oriented x 3 with fluent speech, no focal motor/sensory deficits Breast: (+) mild tenderness of right breat at site of incision, mild nipple swelling, likely from radiation and surgery. No skin changes.   LABORATORY DATA:  I have reviewed the data as listed CBC Latest Ref Rng & Units 07/16/2018 01/03/2018 04/01/2017  WBC 4.0 - 10.5 K/uL 8.2 8.7 8.5  Hemoglobin 12.0 - 15.0 g/dL 10.5(L) 11.1(L) 12.2  Hematocrit 36.0 - 46.0 % 35.0(L) 36.2 38.0  Platelets 150 - 400 K/uL 397 398 326     CMP Latest Ref Rng & Units 07/16/2018 01/03/2018 04/01/2017  Glucose 70 - 99 mg/dL 150(H) 112 120  BUN 8 - 23 mg/dL 10 14 13.6  Creatinine 0.44 - 1.00 mg/dL 0.85 0.76 0.8  Sodium 135 - 145 mmol/L 141 139 141  Potassium 3.5 - 5.1 mmol/L 4.1 4.3 3.8  Chloride 98 - 111 mmol/L 108 107 -  CO2 22 - 32 mmol/L _0 Calcium 8.9 - 10.3 mg/dL 9.2 9.4 9.5  Total Protein 6.5 - 8.1 g/dL 7.6 7.4 7.3  Total Bilirubin 0.3 - 1.2 mg/dL 0.3 0.4 <0.22  Alkaline Phos 38 - 126 U/L 106 109 93  AST 15 - 41 U/L _1 ALT 0 - 44 U/L _2 RADIOGRAPHIC STUDIES: I have personally reviewed the radiological images as listed and agreed with the findings in the report. No results found.   03/10/2018 Mammogram IMPRESSION: No mammographic evidence of malignancy involving the LEFT breast.  ASSESSMENT & PLAN:  Jessica Roberts is a 72 y.o. female with history of  1. Right breast invasive ductal carcinoma, pT1bN0M0, stage IA, grade 1, ER positive, PR positive, HER-2 negative, (+) DCIS and ADH -Diagnosed in 2016. Treated with lumpectomy and radiation. She declined antihormonal therapy. Currently on surveillance. -I reviewed and discussed her 03/10/2018 mammogram results. -Labs reviewed, CBC showed Hg 10.5. CMP, iron studies, and Vitamin D pending.  -She is clinically doing very well, exam was unremarkable, no clinical concern for  recurrence -Continue cancer surveillance  2. Anemia -Labs reviewed, CBC showed Hg 10.5. Hg was normal a year ago and slightly dropped 6 months ago  -No family history of colon cancer. No changes in BM, melena or hematochezia.  -She says that her last colonoscopy was before her cancer diagnosis in 2-3 years.  -Iron study from today pending. If her levels were low, I will refer to GI for work up. If normal, I recommend her to take prenatal multivitamins, and return in a month for CBC, and additional anemia work-up.  3. Depression -Currently on zoloft, bupropion, and diazepam. -stable   4. Vitamin D Deficiency  -Currently on Vitamin D 1000 IU daily -will monitor closely   Plan  -f/u in 3 months with labs -labs in one month for CBC and possible additional anemia work-up   No problem-specific Assessment & Plan notes found for this encounter.   No orders of the defined types were placed in this encounter.  All questions were answered. The patient knows to call the clinic with any problems, questions or concerns. No barriers to learning was detected. I spent 15 minutes counseling the patient face to face. The total time spent in the appointment was 20 minutes and more than 50% was on counseling and review of test results  I, Noor Dweik am acting as scribe for Dr. Truitt Merle.  I have reviewed the above documentation for accuracy and completeness, and I agree with the above.      Truitt Merle, MD 07/16/2018   Addendum  Her iron study came back ferritin 5, iron 32, saturation 8%, consistent with iron deficient anemia.  Will call patient to start ferrous sulfate 2 tablets daily, and refer back to GI for work-up.  Truitt Merle

## 2018-07-16 ENCOUNTER — Inpatient Hospital Stay: Payer: Medicare Other | Attending: Hematology

## 2018-07-16 ENCOUNTER — Telehealth: Payer: Self-pay | Admitting: Hematology

## 2018-07-16 ENCOUNTER — Inpatient Hospital Stay (HOSPITAL_BASED_OUTPATIENT_CLINIC_OR_DEPARTMENT_OTHER): Payer: Medicare Other | Admitting: Hematology

## 2018-07-16 ENCOUNTER — Encounter: Payer: Self-pay | Admitting: Hematology

## 2018-07-16 VITALS — BP 141/64 | HR 90 | Temp 98.0°F | Resp 18 | Ht 66.0 in | Wt 183.5 lb

## 2018-07-16 DIAGNOSIS — F329 Major depressive disorder, single episode, unspecified: Secondary | ICD-10-CM | POA: Insufficient documentation

## 2018-07-16 DIAGNOSIS — Z853 Personal history of malignant neoplasm of breast: Secondary | ICD-10-CM | POA: Insufficient documentation

## 2018-07-16 DIAGNOSIS — C50411 Malignant neoplasm of upper-outer quadrant of right female breast: Secondary | ICD-10-CM

## 2018-07-16 DIAGNOSIS — Z170 Estrogen receptor positive status [ER+]: Secondary | ICD-10-CM

## 2018-07-16 DIAGNOSIS — E559 Vitamin D deficiency, unspecified: Secondary | ICD-10-CM | POA: Diagnosis not present

## 2018-07-16 DIAGNOSIS — E2839 Other primary ovarian failure: Secondary | ICD-10-CM

## 2018-07-16 DIAGNOSIS — D649 Anemia, unspecified: Secondary | ICD-10-CM | POA: Insufficient documentation

## 2018-07-16 DIAGNOSIS — R7989 Other specified abnormal findings of blood chemistry: Secondary | ICD-10-CM

## 2018-07-16 LAB — CBC WITH DIFFERENTIAL/PLATELET
Abs Immature Granulocytes: 0.04 10*3/uL (ref 0.00–0.07)
BASOS ABS: 0.1 10*3/uL (ref 0.0–0.1)
Basophils Relative: 1 %
EOS ABS: 0.3 10*3/uL (ref 0.0–0.5)
EOS PCT: 3 %
HEMATOCRIT: 35 % — AB (ref 36.0–46.0)
HEMOGLOBIN: 10.5 g/dL — AB (ref 12.0–15.0)
IMMATURE GRANULOCYTES: 1 %
LYMPHS ABS: 1.8 10*3/uL (ref 0.7–4.0)
LYMPHS PCT: 22 %
MCH: 25.2 pg — ABNORMAL LOW (ref 26.0–34.0)
MCHC: 30 g/dL (ref 30.0–36.0)
MCV: 84.1 fL (ref 80.0–100.0)
MONOS PCT: 6 %
Monocytes Absolute: 0.5 10*3/uL (ref 0.1–1.0)
NRBC: 0 % (ref 0.0–0.2)
Neutro Abs: 5.6 10*3/uL (ref 1.7–7.7)
Neutrophils Relative %: 67 %
Platelets: 397 10*3/uL (ref 150–400)
RBC: 4.16 MIL/uL (ref 3.87–5.11)
RDW: 16.4 % — ABNORMAL HIGH (ref 11.5–15.5)
WBC: 8.2 10*3/uL (ref 4.0–10.5)

## 2018-07-16 LAB — IRON AND TIBC
Iron: 32 ug/dL — ABNORMAL LOW (ref 41–142)
Saturation Ratios: 8 % — ABNORMAL LOW (ref 21–57)
TIBC: 406 ug/dL (ref 236–444)
UIBC: 374 ug/dL (ref 120–384)

## 2018-07-16 LAB — COMPREHENSIVE METABOLIC PANEL
ALBUMIN: 3.6 g/dL (ref 3.5–5.0)
ALK PHOS: 106 U/L (ref 38–126)
ALT: 15 U/L (ref 0–44)
AST: 15 U/L (ref 15–41)
Anion gap: 11 (ref 5–15)
BUN: 10 mg/dL (ref 8–23)
CALCIUM: 9.2 mg/dL (ref 8.9–10.3)
CHLORIDE: 108 mmol/L (ref 98–111)
CO2: 22 mmol/L (ref 22–32)
CREATININE: 0.85 mg/dL (ref 0.44–1.00)
GFR calc Af Amer: >60 mL/min (ref >60)
GFR calc non Af Amer: >60 mL/min (ref >60)
GLUCOSE: 150 mg/dL — AB (ref 70–99)
Potassium: 4.1 mmol/L (ref 3.5–5.1)
SODIUM: 141 mmol/L (ref 135–145)
Total Bilirubin: 0.3 mg/dL (ref 0.3–1.2)
Total Protein: 7.6 g/dL (ref 6.5–8.1)

## 2018-07-16 LAB — FERRITIN: Ferritin: 5 ng/mL — ABNORMAL LOW (ref 11–307)

## 2018-07-16 NOTE — Telephone Encounter (Signed)
Printed calendar and avs. °

## 2018-07-17 ENCOUNTER — Telehealth: Payer: Self-pay

## 2018-07-17 LAB — VITAMIN D 25 HYDROXY (VIT D DEFICIENCY, FRACTURES): VIT D 25 HYDROXY: 24.3 ng/mL — AB (ref 30.0–100.0)

## 2018-07-17 NOTE — Telephone Encounter (Signed)
-----   Message from Alla Feeling, NP sent at 07/17/2018  2:03 PM EST ----- Please let her know labs are consistent with iron deficiency. Begin ferrous sulfate 325 mg 1 tab twice daily. Review potential SE constipation and dark stool. Dr. Burr Medico recommends referral to GI. If she prefers to go back to who performed her last colonoscopy that's ok, please get the name/practice. If she prefers new provider we can [facility] to [facility] or [facility]. She what her preference is and I'll place referral.  Thanks, Regan Rakers

## 2018-07-17 NOTE — Telephone Encounter (Signed)
Left voice message for patient per Dr. Burr Medico.  that her lab results show is has iron deficiency, instructed her to start taking OTC ferrous sulfate 325 mg twice daily, watch for constipation, also needs to go back to her GI for work up.  Requested she call us back with their name or if she prefers we can make a referral.

## 2018-07-23 ENCOUNTER — Telehealth: Payer: Self-pay | Admitting: Hematology

## 2018-07-23 ENCOUNTER — Other Ambulatory Visit: Payer: Self-pay | Admitting: Hematology

## 2018-07-23 ENCOUNTER — Telehealth: Payer: Self-pay

## 2018-07-23 DIAGNOSIS — Z23 Encounter for immunization: Secondary | ICD-10-CM | POA: Diagnosis not present

## 2018-07-23 DIAGNOSIS — D5 Iron deficiency 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 (chronic): Secondary | ICD-10-CM

## 2018-07-23 NOTE — Telephone Encounter (Signed)
Spoke with patient per Dr. Burr Medico regarding lab results, notified her lab results show iron lvel is very low, which is the cause of her anemia.  Instructed her to start OTC ferrous sulfate 2-3 tabs a day, watch for constipation, recommended she take a stool softener daily.   We will schedule a f/u lab and OV in one month to see if this helps bring her labs up.  She will f/u with her GI for endoscopy.    Patient verbalized an understanding.  Scheduling message was sent.

## 2018-07-23 NOTE — Telephone Encounter (Signed)
Scheduled appt per 12/11 sch message - pt is aware of appt date and time   

## 2018-07-23 NOTE — Telephone Encounter (Signed)
-----   Message from Truitt Merle, MD sent at 07/23/2018  8:13 AM EST ----- Please let pt know her iron level is very low, which is the cause of her anemia. Please let her start OTC ferrous sulfate 2-3 tabs a day, watch for constipation, I will set up lab and f/u in one month to f/u her anemia, and if she needs iv iron. She needs to see her GI back for repeated endoscopy, let us know who is her GI and we will fax results over. Thanks  Truitt Merle  07/23/2018

## 2018-08-16 DIAGNOSIS — J069 Acute upper respiratory infection, unspecified: Secondary | ICD-10-CM | POA: Diagnosis not present

## 2018-08-16 DIAGNOSIS — J0100 Acute maxillary sinusitis, unspecified: Secondary | ICD-10-CM | POA: Diagnosis not present

## 2018-08-18 ENCOUNTER — Other Ambulatory Visit: Payer: Medicare Other

## 2018-08-19 ENCOUNTER — Telehealth: Payer: Self-pay | Admitting: Hematology

## 2018-08-19 NOTE — Telephone Encounter (Signed)
R/s appt per 1/6 sch message - pt is aware of appt date and time

## 2018-08-21 ENCOUNTER — Ambulatory Visit: Payer: Medicare Other | Admitting: Hematology

## 2018-08-23 DIAGNOSIS — J209 Acute bronchitis, unspecified: Secondary | ICD-10-CM | POA: Diagnosis not present

## 2018-09-02 ENCOUNTER — Inpatient Hospital Stay: Payer: Medicare Other | Attending: Hematology

## 2018-09-04 ENCOUNTER — Inpatient Hospital Stay: Payer: Medicare Other | Admitting: Hematology

## 2018-09-04 ENCOUNTER — Telehealth: Payer: Self-pay | Admitting: Hematology

## 2018-09-04 NOTE — Telephone Encounter (Signed)
R/s appt per patient request - 1/23 sch message. Pt is aware of new appt date and time

## 2018-09-11 DIAGNOSIS — L821 Other seborrheic keratosis: Secondary | ICD-10-CM | POA: Diagnosis not present

## 2018-09-11 DIAGNOSIS — L918 Other hypertrophic disorders of the skin: Secondary | ICD-10-CM | POA: Diagnosis not present

## 2018-09-11 DIAGNOSIS — Z85828 Personal history of other malignant neoplasm of skin: Secondary | ICD-10-CM | POA: Diagnosis not present

## 2018-09-23 ENCOUNTER — Inpatient Hospital Stay: Payer: Medicare Other

## 2018-09-23 ENCOUNTER — Inpatient Hospital Stay: Payer: Medicare Other | Attending: Hematology

## 2018-09-23 ENCOUNTER — Telehealth: Payer: Self-pay | Admitting: Hematology

## 2018-09-23 DIAGNOSIS — Z853 Personal history of malignant neoplasm of breast: Secondary | ICD-10-CM | POA: Insufficient documentation

## 2018-09-23 DIAGNOSIS — D649 Anemia, unspecified: Secondary | ICD-10-CM | POA: Insufficient documentation

## 2018-09-23 DIAGNOSIS — E559 Vitamin D deficiency, unspecified: Secondary | ICD-10-CM | POA: Diagnosis not present

## 2018-09-23 DIAGNOSIS — D5 Iron deficiency 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 (chronic): Secondary | ICD-10-CM

## 2018-09-23 DIAGNOSIS — C50411 Malignant neoplasm of upper-outer quadrant of right female breast: Secondary | ICD-10-CM

## 2018-09-23 LAB — COMPREHENSIVE METABOLIC PANEL
ALT: 15 U/L (ref 0–44)
AST: 15 U/L (ref 15–41)
Albumin: 3.8 g/dL (ref 3.5–5.0)
Alkaline Phosphatase: 96 U/L (ref 38–126)
Anion gap: 9 (ref 5–15)
BUN: 12 mg/dL (ref 8–23)
CALCIUM: 9.4 mg/dL (ref 8.9–10.3)
CO2: 23 mmol/L (ref 22–32)
Chloride: 110 mmol/L (ref 98–111)
Creatinine, Ser: 0.76 mg/dL (ref 0.44–1.00)
GFR calc Af Amer: >60 mL/min (ref >60)
GFR calc non Af Amer: >60 mL/min (ref >60)
Glucose, Bld: 104 mg/dL — ABNORMAL HIGH (ref 70–99)
Potassium: 4.2 mmol/L (ref 3.5–5.1)
Sodium: 142 mmol/L (ref 135–145)
Total Bilirubin: 0.3 mg/dL (ref 0.3–1.2)
Total Protein: 7.4 g/dL (ref 6.5–8.1)

## 2018-09-23 LAB — CBC WITH DIFFERENTIAL/PLATELET
Abs Immature Granulocytes: 0.04 10*3/uL (ref 0.00–0.07)
Basophils Absolute: 0.1 10*3/uL (ref 0.0–0.1)
Basophils Relative: 1 %
EOS ABS: 0.3 10*3/uL (ref 0.0–0.5)
EOS PCT: 4 %
HEMATOCRIT: 41.2 % (ref 36.0–46.0)
HEMOGLOBIN: 12.6 g/dL (ref 12.0–15.0)
Immature Granulocytes: 1 %
Lymphocytes Relative: 31 %
Lymphs Abs: 2.4 10*3/uL (ref 0.7–4.0)
MCH: 27.2 pg (ref 26.0–34.0)
MCHC: 30.6 g/dL (ref 30.0–36.0)
MCV: 89 fL (ref 80.0–100.0)
MONOS PCT: 7 %
Monocytes Absolute: 0.6 10*3/uL (ref 0.1–1.0)
NEUTROS PCT: 56 %
NRBC: 0 % (ref 0.0–0.2)
Neutro Abs: 4.4 10*3/uL (ref 1.7–7.7)
Platelets: 367 10*3/uL (ref 150–400)
RBC: 4.63 MIL/uL (ref 3.87–5.11)
RDW: 16.9 % — AB (ref 11.5–15.5)
WBC: 7.8 10*3/uL (ref 4.0–10.5)

## 2018-09-23 LAB — FERRITIN: Ferritin: 20 ng/mL (ref 11–307)

## 2018-09-23 LAB — IRON AND TIBC
Iron: 51 ug/dL (ref 41–142)
SATURATION RATIOS: 15 % — AB (ref 21–57)
TIBC: 350 ug/dL (ref 236–444)
UIBC: 299 ug/dL (ref 120–384)

## 2018-09-23 NOTE — Telephone Encounter (Signed)
Patient called to reschedule to an earlier time

## 2018-09-24 ENCOUNTER — Telehealth: Payer: Self-pay

## 2018-09-24 NOTE — Telephone Encounter (Signed)
Spoke with patient regarding lab results, per Dr. Burr Medico anemia has resolved and iron level is back to normal. She has plans to see her GI doctor, is changing the way she eats entirely.  She verbalized an understanding.

## 2018-09-24 NOTE — Telephone Encounter (Signed)
-----   Message from Truitt Merle, MD sent at 09/24/2018  8:41 AM EST ----- Let her know lab results, anemia has resolved, iron level back to normal.  Please make sure she has seen GI lately for IDA work up, thanks   [employer]  09/24/2018

## 2018-09-25 ENCOUNTER — Telehealth: Payer: Self-pay | Admitting: Hematology

## 2018-09-25 ENCOUNTER — Inpatient Hospital Stay: Payer: Medicare Other | Admitting: Hematology

## 2018-09-25 NOTE — Telephone Encounter (Signed)
Scheduled appt per 2/13 sch message - sent reminder letter in the mail with appt date and time

## 2018-10-12 DIAGNOSIS — J069 Acute upper respiratory infection, unspecified: Secondary | ICD-10-CM | POA: Diagnosis not present

## 2018-10-12 DIAGNOSIS — R062 Wheezing: Secondary | ICD-10-CM | POA: Diagnosis not present

## 2018-10-17 ENCOUNTER — Ambulatory Visit: Payer: Medicare Other | Admitting: Hematology

## 2018-10-17 ENCOUNTER — Other Ambulatory Visit: Payer: Medicare Other

## 2018-10-29 DIAGNOSIS — R05 Cough: Secondary | ICD-10-CM | POA: Diagnosis not present

## 2018-11-05 DIAGNOSIS — F3341 Major depressive disorder, recurrent, in partial remission: Secondary | ICD-10-CM | POA: Diagnosis not present

## 2018-11-05 DIAGNOSIS — F4312 Post-traumatic stress disorder, chronic: Secondary | ICD-10-CM | POA: Diagnosis not present

## 2018-12-12 DIAGNOSIS — F3341 Major depressive disorder, recurrent, in partial remission: Secondary | ICD-10-CM | POA: Diagnosis not present

## 2018-12-12 DIAGNOSIS — F4312 Post-traumatic stress disorder, chronic: Secondary | ICD-10-CM | POA: Diagnosis not present

## 2018-12-18 DIAGNOSIS — G4733 Obstructive sleep apnea (adult) (pediatric): Secondary | ICD-10-CM | POA: Diagnosis not present

## 2018-12-22 ENCOUNTER — Telehealth: Payer: Self-pay | Admitting: Hematology

## 2018-12-22 NOTE — Telephone Encounter (Signed)
Called regarding upcoming [facility] appointment, left a voicemail and e-mail has been sent.

## 2018-12-24 ENCOUNTER — Other Ambulatory Visit: Payer: Medicare Other

## 2018-12-24 ENCOUNTER — Inpatient Hospital Stay: Payer: Medicare Other | Attending: Hematology | Admitting: Hematology

## 2018-12-24 DIAGNOSIS — C50411 Malignant neoplasm of upper-outer quadrant of right female breast: Secondary | ICD-10-CM

## 2018-12-24 DIAGNOSIS — Z170 Estrogen receptor positive status [ER+]: Secondary | ICD-10-CM

## 2018-12-24 NOTE — Progress Notes (Signed)
I called pt for a virtual visit, she stated that she is no interested in virtual visit and want to postpone her office visit until the COVID-19 pandemic resolves. She will call us to schedule her next appointment when she feels comfortable to come in. She knows to call us if she has any new concerns before her next visit.   Jessica Roberts  12/24/2018

## 2018-12-25 ENCOUNTER — Telehealth: Payer: Self-pay | Admitting: Hematology

## 2018-12-25 NOTE — Telephone Encounter (Signed)
No los per 5/13. °

## 2019-02-20 DIAGNOSIS — Z1159 Encounter for screening for other viral diseases: Secondary | ICD-10-CM | POA: Diagnosis not present

## 2019-02-23 DIAGNOSIS — Z1159 Encounter for screening for other viral diseases: Secondary | ICD-10-CM | POA: Diagnosis not present

## 2019-02-26 DIAGNOSIS — F331 Major depressive disorder, recurrent, moderate: Secondary | ICD-10-CM | POA: Diagnosis not present

## 2019-04-23 DIAGNOSIS — L820 Inflamed seborrheic keratosis: Secondary | ICD-10-CM | POA: Diagnosis not present

## 2019-04-23 DIAGNOSIS — L821 Other seborrheic keratosis: Secondary | ICD-10-CM | POA: Diagnosis not present

## 2019-04-23 DIAGNOSIS — Z85828 Personal history of other malignant neoplasm of skin: Secondary | ICD-10-CM | POA: Diagnosis not present

## 2019-05-30 DIAGNOSIS — R109 Unspecified abdominal pain: Secondary | ICD-10-CM | POA: Diagnosis not present

## 2019-05-30 DIAGNOSIS — N12 Tubulo-interstitial nephritis, not specified as acute or chronic: Secondary | ICD-10-CM | POA: Diagnosis not present

## 2019-06-01 DIAGNOSIS — N390 Urinary tract infection, site not specified: Secondary | ICD-10-CM | POA: Diagnosis not present

## 2019-06-01 DIAGNOSIS — N12 Tubulo-interstitial nephritis, not specified as acute or chronic: Secondary | ICD-10-CM | POA: Diagnosis not present

## 2019-06-01 DIAGNOSIS — D649 Anemia, unspecified: Secondary | ICD-10-CM | POA: Diagnosis not present

## 2019-06-01 DIAGNOSIS — R109 Unspecified abdominal pain: Secondary | ICD-10-CM | POA: Diagnosis not present

## 2019-06-10 DIAGNOSIS — R350 Frequency of micturition: Secondary | ICD-10-CM | POA: Diagnosis not present

## 2019-07-14 DIAGNOSIS — M21622 Bunionette of left foot: Secondary | ICD-10-CM | POA: Diagnosis not present

## 2019-07-14 DIAGNOSIS — M21621 Bunionette of right foot: Secondary | ICD-10-CM | POA: Diagnosis not present

## 2019-07-14 DIAGNOSIS — M21611 Bunion of right foot: Secondary | ICD-10-CM | POA: Diagnosis not present

## 2019-07-14 DIAGNOSIS — M21612 Bunion of left foot: Secondary | ICD-10-CM | POA: Diagnosis not present

## 2019-07-27 DIAGNOSIS — F331 Major depressive disorder, recurrent, moderate: Secondary | ICD-10-CM | POA: Diagnosis not present

## 2019-09-09 DIAGNOSIS — Z1159 Encounter for screening for other viral diseases: Secondary | ICD-10-CM | POA: Diagnosis not present

## 2019-09-09 DIAGNOSIS — Z1152 Encounter for screening for COVID-19: Secondary | ICD-10-CM | POA: Diagnosis not present

## 2019-10-11 DIAGNOSIS — F4312 Post-traumatic stress disorder, chronic: Secondary | ICD-10-CM | POA: Diagnosis not present

## 2019-10-11 DIAGNOSIS — F331 Major depressive disorder, recurrent, moderate: Secondary | ICD-10-CM | POA: Diagnosis not present

## 2019-10-19 ENCOUNTER — Other Ambulatory Visit: Payer: Self-pay | Admitting: Podiatry

## 2019-10-19 ENCOUNTER — Ambulatory Visit: Payer: Medicare Other | Admitting: Podiatry

## 2019-10-19 ENCOUNTER — Encounter: Payer: Self-pay | Admitting: Podiatry

## 2019-10-19 ENCOUNTER — Ambulatory Visit (INDEPENDENT_AMBULATORY_CARE_PROVIDER_SITE_OTHER): Payer: Medicare Other

## 2019-10-19 ENCOUNTER — Other Ambulatory Visit: Payer: Self-pay

## 2019-10-19 DIAGNOSIS — M21619 Bunion of unspecified foot: Secondary | ICD-10-CM

## 2019-10-19 DIAGNOSIS — M79671 Pain in right foot: Secondary | ICD-10-CM | POA: Diagnosis not present

## 2019-10-19 DIAGNOSIS — M2011 Hallux valgus (acquired), right foot: Secondary | ICD-10-CM | POA: Diagnosis not present

## 2019-10-19 DIAGNOSIS — L84 Corns and callosities: Secondary | ICD-10-CM | POA: Diagnosis not present

## 2019-10-22 NOTE — Progress Notes (Signed)
Subjective:   Patient ID: Jessica Roberts, female   DOB: 74 y.o.   MRN: [id number]   HPI Patient presents with lesions on the outside of both feet and states they get sore make it hard for her to walk and that this is been an ongoing issue.  Patient states she tries to trim them herself which is not effective gradual become more aggravating.  Patient does not smoke likes to be active and also is concerned about bunions   Review of Systems  All other systems reviewed and are negative.       Objective:  Physical Exam Vitals and nursing note reviewed.  Constitutional:      Appearance: She is well-developed.  Pulmonary:     Effort: Pulmonary effort is normal.  Musculoskeletal:        General: Normal range of motion.  Skin:    General: Skin is warm.  Neurological:     Mental Status: She is alert.     Neurovascular status intact muscle strength found to be adequate range of motion within normal limits.  Patient is noted to have keratotic lesion subfifth metatarsals of both feet that are painful and is found to have moderate bunion deformity bilateral with good digital perfusion well oriented x3 and moderate flatfoot deformity      Assessment:  Structural changes with keratotic lesion formation bunion deformity noted     Plan:  H&P all conditions x-rays reviewed discussed structural deformities debrided lesions and discussed the possibility for surgical intervention in this case.  Patient will see how debridement does we will try wider shoes cushion and then we can decide if any more aggressive therapy for this patient will be necessary  X-rays indicate that there is significant structural deformity with deviation and lesion formation

## 2019-11-26 DIAGNOSIS — D485 Neoplasm of uncertain behavior of skin: Secondary | ICD-10-CM | POA: Diagnosis not present

## 2019-11-26 DIAGNOSIS — Z85828 Personal history of other malignant neoplasm of skin: Secondary | ICD-10-CM | POA: Diagnosis not present

## 2019-11-26 DIAGNOSIS — L080 Pyoderma: Secondary | ICD-10-CM | POA: Diagnosis not present

## 2019-11-26 DIAGNOSIS — L918 Other hypertrophic disorders of the skin: Secondary | ICD-10-CM | POA: Diagnosis not present

## 2019-11-26 DIAGNOSIS — L821 Other seborrheic keratosis: Secondary | ICD-10-CM | POA: Diagnosis not present

## 2019-11-26 DIAGNOSIS — D0439 Carcinoma in situ of skin of other parts of face: Secondary | ICD-10-CM | POA: Diagnosis not present

## 2019-12-01 DIAGNOSIS — L918 Other hypertrophic disorders of the skin: Secondary | ICD-10-CM | POA: Diagnosis not present

## 2019-12-01 DIAGNOSIS — L821 Other seborrheic keratosis: Secondary | ICD-10-CM | POA: Diagnosis not present

## 2019-12-01 DIAGNOSIS — Z85828 Personal history of other malignant neoplasm of skin: Secondary | ICD-10-CM | POA: Diagnosis not present

## 2019-12-01 DIAGNOSIS — D485 Neoplasm of uncertain behavior of skin: Secondary | ICD-10-CM | POA: Diagnosis not present

## 2019-12-29 DIAGNOSIS — M79641 Pain in right hand: Secondary | ICD-10-CM | POA: Diagnosis not present

## 2019-12-29 DIAGNOSIS — M1811 Unilateral primary osteoarthritis of first carpometacarpal joint, right hand: Secondary | ICD-10-CM | POA: Diagnosis not present

## 2019-12-29 DIAGNOSIS — S63659A Sprain of metacarpophalangeal joint of unspecified finger, initial encounter: Secondary | ICD-10-CM | POA: Diagnosis not present

## 2019-12-29 DIAGNOSIS — S66811A Strain of other specified muscles, fascia and tendons at wrist and hand level, right hand, initial encounter: Secondary | ICD-10-CM | POA: Diagnosis not present

## 2020-01-14 ENCOUNTER — Telehealth: Payer: Self-pay

## 2020-01-14 NOTE — Telephone Encounter (Signed)
Jessica Roberts left vm requesting appointment.  Scheduling message sent.

## 2020-01-15 ENCOUNTER — Telehealth: Payer: Self-pay | Admitting: Hematology

## 2020-01-15 NOTE — Telephone Encounter (Signed)
Scheduled per 6/3 sch message. Pt aware of appts on 6/7.

## 2020-01-17 NOTE — Progress Notes (Signed)
[place]   Telephone:([phone]) [phone] [phone]) [phone]   Clinic Follow up Note   Patient Care Team: Cari Caraway, MD as PCP - General (Family Medicine) Fanny Skates, MD as Consulting Physician (General Surgery) Truitt Merle, MD as Consulting Physician (Hematology) Thea Silversmith, MD as Consulting Physician (Radiation Oncology) Mauro Kaufmann, RN as Registered Nurse Rockwell Germany, RN as Registered Nurse Sylvan Cheese, NP as Nurse Practitioner (Nurse Practitioner) 01/18/2020  CHIEF COMPLAINT: F/u right breast cancer   SUMMARY OF ONCOLOGIC HISTORY: Oncology History Overview Note  Cancer Staging Breast cancer of upper-outer quadrant of right female breast [facility]) Staging form: Breast, AJCC 7th Edition - Clinical stage from 04/07/2015: Stage IA (T1a, N0, M0) - Signed by Truitt Merle, MD on 05/20/2015 - Pathologic stage from 04/27/2015: Stage IA (T1b, N0, cM0) - Signed by Truitt Merle, MD on 05/20/2015       Breast cancer of upper-outer quadrant of right female breast ([place])  04/01/2015 Mammogram   Diagnostic mammogram and US showed a 0.7x 0.4 x 0.4 cm mass in the right breast 11:00 position, and a second 76m lesion at upper-midline, and a benign cluster of cysts measuring 1cm.    04/07/2015 Initial Biopsy   Right breast UOQ mass core needle biopsy showed invasive ductal carcinoma and DCIS, grade 1. ER+ (100%), PR+ (80%), HER2/neu negative (ratio 1.25), Ki67 5%.   04/07/2015 Clinical Stage   Stage IA: T1a N0   04/27/2015 Definitive Surgery   Right breast double lumpectomy/SLNB: IDC, 0.9 cm, DCIS, invasive dz 0.1 cm from posterior margin, rem. margins >0.5 cm; second lump: fibrocystic change. 1 LN negative for malignancy   04/27/2015 Pathologic Stage   Stage IA: T1b N0   06/21/2015 - 07/13/2015 Radiation Therapy   Adjuvant RT: Right breast 42.72 Gy over 21 fractions.     Anti-estrogen oral therapy   Pt declined adjuvant anti-estrogen therapy    09/29/2015  Survivorship   Survivorship visit completed and copy of care plan given to patient.   07/17/2016 Imaging   MM DIAG BREAST TOMO BILATERALL 07/17/16 MPRESSION: Probable mildly complicated fluid collection/seroma within the right breast 1130 o'clock along the posterior margin of the lumpectomy site.   09/04/2016 Imaging   [place] UN[place] RIGHT INC AXILLA: 09/04/16 IMPRESSION: Stable probably benign postsurgical fluid collection/seroma within the right breast at the 11:30 o'clock axis, 6 cm from the nipple, measuring 2.3 x 0.9 x 1.2 cm. Recommend additional follow-up right breast diagnostic mammogram and ultrasound in 6 months to ensure continued stability.   03/21/2017 Mammogram   R breast Mammogram and [place] Mammographic images were processed with CAD.  Targeted ultrasound is performed, showing slightly smaller mixed echogenicity collection within the right breast at 11:30 o'clock 6 cm from nipple measuring 0.9 x 0.7 x 1.4 cm. This is probably a postsurgical seroma.   07/31/2017 Mammogram   IMPRESSION: 1. Indeterminate 9 mm group of fine pleomorphic calcifications involving the upper outer quadrant of the right breast at posterior depth. 2. New partially obscured mass involving the inner left breast at anterior posterior depth.   08/07/2017 Breast [place]  Targeted right breast ultrasound is performed, showing the previously identified mildly complex fluid collection at the lumpectomy site at the 11:30 o'clock approximately 6 cm from the nipple has decreased in size in the interval, currently measuring 6 x 8 x 8 mm (previously 7 x 9 by 14 mm). No new suspicious solid mass or abnormal acoustic shadowing is identified.  Targeted left  breast ultrasound is performed, showing that the new mammographic mass corresponds to an oval circumscribed parallel hypoechoic mass at the 10 o'clock position approximately 4 cm from the nipple measuring approximately 5 x 4 x 5 mm, demonstrating slight posterior  acoustic enhancement and no internal power Doppler flow.  IMPRESSION: 1. Interval decrease in size of the postoperative seroma at the lumpectomy site in the upper outer quadrant of the right breast. 2. Likely benign 5 mm complex cyst in the upper inner quadrant of the left breast accounting for the new mammographic finding.   08/07/2017 Procedure   aspiration of the complex cyst in the upper inner quadrant of the left breast at the 10 o'clock position approximately 4 cm from the nipple. Less than 1 cc of cyst fluid was aspirated. The cyst completely collapsed with aspiration and there is no associated solid component.   08/07/2017 Pathology Results   Diagnosis Breast, left, needle core biopsy, UIQ at posterior depth - BENIGN BREAST TISSUE WITH CALCIFICATIONS. - NO MALIGNANCY IDENTIFIED. - SEE COMMENT.    03/10/2018 Mammogram    03/10/2018 Mammogram IMPRESSION: No mammographic evidence of malignancy involving the LEFT breast.     CURRENT THERAPY: Surveillance   INTERVAL HISTORY: Ms. Plemmons returns for f/u. She was last seen in clinic on 07/2018. She was not interested in virtual visit and not comfortable with in-person visit during the [place] pandemic. Her last mammogram was in 02/2018. She isolated during COVID and did not see any providers. She took oral iron sporadically. Her depression and PTSD symptoms have increased in the last 2 months. She has familial stressors. She has mental health specialist but did not engage much during pandemic. One month ago she developed right axillary pain at her incision site that radiates to right breast. Not sure if there is a lump there. Denies nipple changes/discharge/inversion. Denies injury, strain, or heavy lifting. 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 This pain is mostly "settled," but does notice it if she lifts. She also notes 1 month h/o lower  neck/upper back pain. Denies other bone/joint pains. Activity level has been nearly nonexistent this year, mostly sedentary. Appetite is stable. Denies unintentional weight loss. No change in normal bowel habits which is periodic isolated diarrhea. Denies bleeding, abdominal pain, bloating. She has chronic cough and chronic bronchitis at baseline. Uses ventolin and flonase for possible allergy component.    MEDICAL HISTORY:  Past Medical History:  Diagnosis Date  . Allergy   . Anxiety   . Anxiety and depression   . Breast cancer ([place]) 04/07/15   right breast  . Breast cancer of upper-outer quadrant of right female breast ([place]) 04/11/2015  . Broken nose   . Complication of anesthesia    "had a very hard time waking up"  . Dengue fever   . Depression   . Fall    broken nose  . GERD (gastroesophageal reflux disease)   . Head injury 1995   concussion in [place], med evac to [place], ear damage with vertigo  . Head injury, acute, with loss of consciousness ([place]) 2000   [place] of the country  . Head injury, closed, with concussion 1986   [place]  . Headache   . Hypertension   . Malaria   . PONV (postoperative nausea and vomiting)   . Typhoid     SURGICAL HISTORY: Past Surgical History:  Procedure  Laterality Date  . BREAST LUMPECTOMY Right 04/27/2015  . BREAST LUMPECTOMY WITH NEEDLE LOCALIZATION AND AXILLARY SENTINEL LYMPH NODE BX Right 04/27/2015   Procedure: RIGHT BREAST LUMPECTOMY WITH  TWO NEEDLE LOCALIZATION AND TWO RIGHT AXILLARY SENTINEL LYMPH NODE BX;  Surgeon: Fanny Skates, MD;  Location: [facility];  Service: General;  Laterality: Right;  . COLONOSCOPY W/ BIOPSIES AND POLYPECTOMY    . NERVE SURGERY     '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' during surgery for endometriosis  . surgical procedure for endometriosis    . TONSILLECTOMY      I have reviewed the social history and family history with the patient and they are unchanged from previous note.  ALLERGIES:  is allergic to  codeine; percocet [oxycodone-acetaminophen]; and percodan [oxycodone-aspirin].  MEDICATIONS:  Current Outpatient Medications  Medication Sig Dispense Refill  . acetaminophen (TYLENOL) 500 MG tablet Take 500 mg by mouth 2 (two) times daily as needed for moderate pain (pain). Reported on 10/12/2015    . albuterol (PROVENTIL HFA;VENTOLIN HFA) 108 (90 Base) MCG/ACT inhaler Inhale 1 puff into the lungs every 6 (six) hours as needed for wheezing or shortness of breath.    Marland Kitchen buPROPion (WELLBUTRIN XL) 300 MG 24 hr tablet Take 300 mg by mouth daily.   1  . sertraline (ZOLOFT) 100 MG tablet Take 100 mg by mouth daily.     . diazepam (VALIUM) 5 MG tablet Take 2.5 mg by mouth daily as needed for anxiety.     . Esomeprazole Magnesium (NEXIUM PO) Take 22.3 mg by mouth daily.     . Multiple Vitamin (MULTIVITAMIN WITH MINERALS) TABS tablet Take 1 tablet by mouth at bedtime.     No current facility-administered medications for this visit.    PHYSICAL EXAMINATION:  Vitals:   01/18/20 1432  BP: (!) 158/72  Pulse: 88  Resp: 20  Temp: 98.1 F (36.7 C)  SpO2: 99%   Filed Weights   01/18/20 1432  Weight: 183 lb 9.6 oz (83.3 kg)    GENERAL:alert, no distress and comfortable SKIN: no rash to exposed skin  EYES:  sclera clear NECK: without mass LYMPH:  no palpable cervical, supraclavicular or infraclavicular lymphadenopathy  LUNGS: clear with normal breathing effort HEART: regular rate & rhythm, no lower extremity edema ABDOMEN: abdomen soft, non-tender and normal bowel sounds Musculoskeletal: focal tenderness to left mid lateral humerus and low cervical spine. No palpable abnormality or erythema  NEURO: alert & oriented x 3 with fluent speech, normal gait Breast exam: S/p right lumpectomy. Incisions completely healed. There is mild retraction at the right axillary incision with tenderness. Both breasts are diffusely tender on palpation. No nodularity or masses in either breast or axilla that I could  appreciate   LABORATORY DATA:  I have reviewed the data as listed CBC Latest Ref Rng & Units 01/18/2020 09/23/2018 07/16/2018  WBC 4.0 - 10.5 K/uL 9.2 7.8 8.2  Hemoglobin 12.0 - 15.0 g/dL 12.1 12.6 10.5(L)  Hematocrit 36.0 - 46.0 % 39.4 41.2 35.0(L)  Platelets 150 - 400 K/uL 378 367 397     CMP Latest Ref Rng & Units 01/18/2020 09/23/2018 07/16/2018  Glucose 70 - 99 mg/dL 138(H) 104(H) 150(H)  BUN 8 - 23 mg/dL '13 12 10  '$ Creatinine 0.44 - 1.00 mg/dL 0.80 0.76 0.85  Sodium 135 - 145 mmol/L 141 142 141  Potassium 3.5 - 5.1 mmol/L 3.6 4.2 4.1  Chloride 98 - 111 mmol/L 107 110 108  CO2 22 - 32 mmol/L 24 23 22  Calcium 8.9 - 10.3 mg/dL 9.3 9.4 9.2  Total Protein 6.5 - 8.1 g/dL 7.7 7.4 7.6  Total Bilirubin 0.3 - 1.2 mg/dL 0.5 0.3 0.3  Alkaline Phos 38 - 126 U/L 106 96 106  AST 15 - 41 U/L '18 15 15  '$ ALT 0 - 44 U/L '17 15 15      '$ RADIOGRAPHIC STUDIES: I have personally reviewed the radiological images as listed and agreed with the findings in the report. No results found.   ASSESSMENT & PLAN: Tamiko Leopard is a 74 y.o. female with history of  1. Right breast invasive ductal carcinoma, pT1bN0M0, stage IA, grade 1, ER positive, PR positive, HER-2 negative, (+) DCIS and ADH -Diagnosed in 2016. S/p right lumpectomy and radiation. She declined anti-estrogen therapy.  -Currently on surveillance. -last bilateral mammogram in 07/2017 showed calcifications. She is overdue for routine mammogram. She agrees to schedule today. Due to recent right breast/axillary pain, will add [place]. I will f/u on results.   2. Anemia -she had mild anemia in 12/2017 - 07/2018 Hgb down to 10.5, consistent with iron deficiency ferritin 5. She began oral iron but took it inconsistently during [place].  -Her anemia resolved, however, she remains iron deficient. Denies bleeding.  -I recommend to restart oral iron once daily.  -She is currently in the process of scheduling an appointment and colonoscopy with GI ([facility])     3. Depression -Currently on zoloft, bupropion; mental health provider did not refill diazepam. -More symptoms of depression and PTSD lately, she has had multiple stressors such as sick family member and her sister in law recently died, in addition to covid19 pandemic stress.  -I recommend to call mental health provider, she agrees.   4. Vitamin D Deficiency  -Currently on Vitamin D 1000 IU daily -level is pending from today, will f/u    Disposition:  Ms. Banfield appears stable. Her breast exam shows bilateral breast tenderness with focal tenderness at the at the healed right surgical scar, likely secondary to scar tissue vs seroma which she had back in 2018. I do not appreciate a discrete mass or signs of recurrent breast cancer. Given that she is overdue for mammogram, I am recommending for her to proceed with that now. I will add on right breast US. If positive for seroma, I will refer her back to surgeon.   She has focal osseous pain at the left lateral humerus and low cervical spine. Her arm pain developed 6 months ago and has improved. Neck pain is more acute over the last month. I have low suspicion this is cancer related pain. Will obtain plain films today to evaluate.   For her anxiety/depression and PTSD, she continues wellbutrin and zoloft.  I recommend for her to contact her mental health provider. She agrees.   Otherwise she is doing well. CBC and CMP are normal except BG 138. Her anemia resolved. However, she remains iron deficient with ferritin 6, serum iron 59, TIBC 443, and 13% transferrin saturation. I recommend for her to resume daily iron consistently. She is in the process of setting up GI appointment. Will see derm this week. She has scheduled appointments with her regular providers and understands the importance of re-establishing mental and physical health care appointments. I encouraged her to increase her physical activity, eat healthy diet and make good lifestyle  choices; she agrees.   If work up is negative, she will return for routine cancer surveillance in 6 months with lab. She is nearing 5 years  from her diagnosis. Will repeat iron studies in 3 and 6 months to monitor her response to oral iron. If she does not respond or tolerate well, she may need IV iron.    No problem-specific Assessment & Plan notes found for this encounter.   Orders Placed This Encounter  Procedures  . MM DIAG BREAST TOMO BILATERAL    Standing Status:   Future    Standing Expiration Date:   01/17/2021    Order Specific Question:   Reason for Exam (SYMPTOM  OR DIAGNOSIS REQUIRED)    Answer:   h/o right breast cancer    Order Specific Question:   Preferred imaging location?    Answer:   [facility]  . DG Humerus Left    Standing Status:   Future    Number of Occurrences:   1    Standing Expiration Date:   01/17/2021    Order Specific Question:   Reason for Exam (SYMPTOM  OR DIAGNOSIS REQUIRED)    Answer:   focal tenderness mid upper left arm    Order Specific Question:   Preferred imaging location?    Answer:   [facility]    Order Specific Question:   Radiology Contrast Protocol - do NOT remove file path    Answer:   \\[email or web address]s.pdf  . DG Cervical Spine Complete    Standing Status:   Future    Number of Occurrences:   1    Standing Expiration Date:   01/17/2021    Order Specific Question:   Reason for Exam (SYMPTOM  OR DIAGNOSIS REQUIRED)    Answer:   focal low cervical high thoracic tenderness, h.o breast cancer    Order Specific Question:   Preferred imaging location?    Answer:   [facility]    Order Specific Question:   Radiology Contrast Protocol - do NOT remove file path    Answer:   \\[email or web address]s.pdf  . DG Thoracic Spine 2 View    Standing Status:   Future    Number of Occurrences:   1    Standing Expiration Date:   01/17/2021    Order Specific Question:    Reason for Exam (SYMPTOM  OR DIAGNOSIS REQUIRED)    Answer:   focal low cervical high thoracic tenderness, h.o breast cancer    Order Specific Question:   Preferred imaging location?    Answer:   [facility]    Order Specific Question:   Radiology Contrast Protocol - do NOT remove file path    Answer:   \\[email or web address]s.pdf  . US BREAST LTD UNI RIGHT INC AXILLA    Standing Status:   Future    Standing Expiration Date:   01/17/2021    Order Specific Question:   Reason for Exam (SYMPTOM  OR DIAGNOSIS REQUIRED)    Answer:   h/o right breast cancer, 1 month right breast/axillary pain at incision    Order Specific Question:   Preferred imaging location?    Answer:   [facility]   All questions were answered. The patient knows to call the clinic with any problems, questions or concerns. No barriers to learning was detected. Total encounter time was 40 minutes.      Alla Feeling, NP 01/18/20

## 2020-01-18 ENCOUNTER — Inpatient Hospital Stay: Payer: Medicare Other | Attending: Nurse Practitioner | Admitting: Nurse Practitioner

## 2020-01-18 ENCOUNTER — Encounter: Payer: Self-pay | Admitting: Nurse Practitioner

## 2020-01-18 ENCOUNTER — Inpatient Hospital Stay: Payer: Medicare Other

## 2020-01-18 ENCOUNTER — Other Ambulatory Visit: Payer: Self-pay

## 2020-01-18 ENCOUNTER — Ambulatory Visit (HOSPITAL_COMMUNITY)
Admission: RE | Admit: 2020-01-18 | Discharge: 2020-01-18 | Disposition: A | Discharge status: Home / Self Care | Payer: Medicare Other | Source: Ambulatory Visit | Attending: Nurse Practitioner | Admitting: Nurse Practitioner

## 2020-01-18 VITALS — BP 158/72 | HR 88 | Temp 98.1°F | Resp 20 | Ht 66.0 in | Wt 183.6 lb

## 2020-01-18 DIAGNOSIS — M546 Pain in thoracic spine: Secondary | ICD-10-CM | POA: Diagnosis not present

## 2020-01-18 DIAGNOSIS — Z853 Personal history of malignant neoplasm of breast: Secondary | ICD-10-CM | POA: Insufficient documentation

## 2020-01-18 DIAGNOSIS — Z170 Estrogen receptor positive status [ER+]: Secondary | ICD-10-CM | POA: Insufficient documentation

## 2020-01-18 DIAGNOSIS — C50411 Malignant neoplasm of upper-outer quadrant of right female breast: Secondary | ICD-10-CM

## 2020-01-18 DIAGNOSIS — E2839 Other primary ovarian failure: Secondary | ICD-10-CM

## 2020-01-18 DIAGNOSIS — M19012 Primary osteoarthritis, left shoulder: Secondary | ICD-10-CM | POA: Diagnosis not present

## 2020-01-18 DIAGNOSIS — E559 Vitamin D deficiency, unspecified: Secondary | ICD-10-CM | POA: Insufficient documentation

## 2020-01-18 DIAGNOSIS — D649 Anemia, unspecified: Secondary | ICD-10-CM | POA: Insufficient documentation

## 2020-01-18 DIAGNOSIS — F329 Major depressive disorder, single episode, unspecified: Secondary | ICD-10-CM | POA: Insufficient documentation

## 2020-01-18 DIAGNOSIS — D5 Iron deficiency 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 (chronic): Secondary | ICD-10-CM

## 2020-01-18 DIAGNOSIS — M542 Cervicalgia: Secondary | ICD-10-CM | POA: Diagnosis not present

## 2020-01-18 LAB — COMPREHENSIVE METABOLIC PANEL
ALT: 17 U/L (ref 0–44)
AST: 18 U/L (ref 15–41)
Albumin: 3.7 g/dL (ref 3.5–5.0)
Alkaline Phosphatase: 106 U/L (ref 38–126)
Anion gap: 10 (ref 5–15)
BUN: 13 mg/dL (ref 8–23)
CO2: 24 mmol/L (ref 22–32)
Calcium: 9.3 mg/dL (ref 8.9–10.3)
Chloride: 107 mmol/L (ref 98–111)
Creatinine, Ser: 0.8 mg/dL (ref 0.44–1.00)
GFR calc Af Amer: >60 mL/min (ref >60)
GFR calc non Af Amer: >60 mL/min (ref >60)
Glucose, Bld: 138 mg/dL — ABNORMAL HIGH (ref 70–99)
Potassium: 3.6 mmol/L (ref 3.5–5.1)
Sodium: 141 mmol/L (ref 135–145)
Total Bilirubin: 0.5 mg/dL (ref 0.3–1.2)
Total Protein: 7.7 g/dL (ref 6.5–8.1)

## 2020-01-18 LAB — CBC WITH DIFFERENTIAL/PLATELET
Abs Immature Granulocytes: 0.06 10*3/uL (ref 0.00–0.07)
Basophils Absolute: 0.1 10*3/uL (ref 0.0–0.1)
Basophils Relative: 1 %
Eosinophils Absolute: 0.3 10*3/uL (ref 0.0–0.5)
Eosinophils Relative: 3 %
HCT: 39.4 % (ref 36.0–46.0)
Hemoglobin: 12.1 g/dL (ref 12.0–15.0)
Immature Granulocytes: 1 %
Lymphocytes Relative: 26 %
Lymphs Abs: 2.4 10*3/uL (ref 0.7–4.0)
MCH: 27.3 pg (ref 26.0–34.0)
MCHC: 30.7 g/dL (ref 30.0–36.0)
MCV: 88.9 fL (ref 80.0–100.0)
Monocytes Absolute: 0.6 10*3/uL (ref 0.1–1.0)
Monocytes Relative: 6 %
Neutro Abs: 5.8 10*3/uL (ref 1.7–7.7)
Neutrophils Relative %: 63 %
Platelets: 378 10*3/uL (ref 150–400)
RBC: 4.43 MIL/uL (ref 3.87–5.11)
RDW: 14 % (ref 11.5–15.5)
WBC: 9.2 10*3/uL (ref 4.0–10.5)
nRBC: 0 % (ref 0.0–0.2)

## 2020-01-18 LAB — FERRITIN: Ferritin: 6 ng/mL — ABNORMAL LOW (ref 11–307)

## 2020-01-18 LAB — IRON AND TIBC
Iron: 59 ug/dL (ref 41–142)
Saturation Ratios: 13 % — ABNORMAL LOW (ref 21–57)
TIBC: 443 ug/dL (ref 236–444)
UIBC: 384 ug/dL (ref 120–384)

## 2020-01-18 LAB — VITAMIN D 25 HYDROXY (VIT D DEFICIENCY, FRACTURES): Vit D, 25-Hydroxy: 21.36 ng/mL — ABNORMAL LOW (ref 30–100)

## 2020-01-19 ENCOUNTER — Telehealth: Payer: Self-pay | Admitting: Nurse Practitioner

## 2020-01-19 ENCOUNTER — Telehealth: Payer: Self-pay

## 2020-01-19 NOTE — Telephone Encounter (Signed)
Scheduled appt per 6/7 los.  Spoke with pt and she is aware of herf appt dates and time.

## 2020-01-19 NOTE — Telephone Encounter (Signed)
Left voicemail for patient to call back [facility] 

## 2020-01-20 ENCOUNTER — Telehealth: Payer: Self-pay

## 2020-01-20 NOTE — Telephone Encounter (Signed)
Left voicemail for patient to call back [facility] 

## 2020-01-20 NOTE — Telephone Encounter (Signed)
TC to pt per Cira Rue NP to let her know that the xray from yesterday shows degenerative disease in the cervical neck and left AC (shoulder) joint. Not sure if that's what is causing her mid left arm pain. No bony abnormality worrisome for cancer, no fracture. I let her know that Lacie recommend referral to Ortho. If she agrees. Patient agrees and Lacie made aware. I let patient know that Regan Rakers will place the referral to [facility]. Patient verbalized understanding of everything and was appreciative for the call.

## 2020-01-21 ENCOUNTER — Other Ambulatory Visit: Payer: Self-pay | Admitting: Nurse Practitioner

## 2020-01-21 DIAGNOSIS — M79622 Pain in left upper arm: Secondary | ICD-10-CM

## 2020-01-27 DIAGNOSIS — L820 Inflamed seborrheic keratosis: Secondary | ICD-10-CM | POA: Diagnosis not present

## 2020-01-27 DIAGNOSIS — B078 Other viral warts: Secondary | ICD-10-CM | POA: Diagnosis not present

## 2020-01-27 DIAGNOSIS — Z85828 Personal history of other malignant neoplasm of skin: Secondary | ICD-10-CM | POA: Diagnosis not present

## 2020-01-27 DIAGNOSIS — L720 Epidermal cyst: Secondary | ICD-10-CM | POA: Diagnosis not present

## 2020-01-31 DIAGNOSIS — F331 Major depressive disorder, recurrent, moderate: Secondary | ICD-10-CM | POA: Diagnosis not present

## 2020-01-31 DIAGNOSIS — F4312 Post-traumatic stress disorder, chronic: Secondary | ICD-10-CM | POA: Diagnosis not present

## 2020-02-01 ENCOUNTER — Other Ambulatory Visit: Payer: Medicare Other

## 2020-02-05 DIAGNOSIS — R05 Cough: Secondary | ICD-10-CM | POA: Diagnosis not present

## 2020-02-05 DIAGNOSIS — Z03818 Encounter for observation for suspected exposure to other biological agents ruled out: Secondary | ICD-10-CM | POA: Diagnosis not present

## 2020-02-05 DIAGNOSIS — J40 Bronchitis, not specified as acute or chronic: Secondary | ICD-10-CM | POA: Diagnosis not present

## 2020-02-05 DIAGNOSIS — R0981 Nasal congestion: Secondary | ICD-10-CM | POA: Diagnosis not present

## 2020-02-12 ENCOUNTER — Telehealth: Payer: Self-pay | Admitting: *Deleted

## 2020-02-12 ENCOUNTER — Telehealth: Payer: Self-pay

## 2020-02-12 NOTE — Telephone Encounter (Signed)
Regan Rakers, NP had referred her to ortho, Dr. Melina Schools and she has not heard from them yet. I asking for referral to be sent again or provide her with the phone # to call to schedule the appointment herself.

## 2020-02-12 NOTE — Telephone Encounter (Signed)
Referral, ov, xray reports, and demographics sheet faxed to Dr. Rolena Infante' office. [phone] Ms Lapage notified

## 2020-02-15 DIAGNOSIS — F4312 Post-traumatic stress disorder, chronic: Secondary | ICD-10-CM | POA: Diagnosis not present

## 2020-02-15 DIAGNOSIS — F331 Major depressive disorder, recurrent, moderate: Secondary | ICD-10-CM | POA: Diagnosis not present

## 2020-02-16 NOTE — Telephone Encounter (Signed)
I spoke with Ms Nodal and provided [facility] phone number

## 2020-02-19 ENCOUNTER — Other Ambulatory Visit: Payer: Self-pay

## 2020-02-19 ENCOUNTER — Ambulatory Visit
Admission: RE | Admit: 2020-02-19 | Discharge: 2020-02-19 | Disposition: A | Discharge status: Home / Self Care | Payer: Medicare Other | Source: Ambulatory Visit | Attending: Nurse Practitioner | Admitting: Nurse Practitioner

## 2020-02-19 DIAGNOSIS — Z170 Estrogen receptor positive status [ER+]: Secondary | ICD-10-CM

## 2020-02-19 DIAGNOSIS — N644 Mastodynia: Secondary | ICD-10-CM | POA: Diagnosis not present

## 2020-02-19 DIAGNOSIS — R928 Other abnormal and inconclusive findings on diagnostic imaging of breast: Secondary | ICD-10-CM | POA: Diagnosis not present

## 2020-02-19 DIAGNOSIS — C50411 Malignant neoplasm of upper-outer quadrant of right female breast: Secondary | ICD-10-CM

## 2020-02-22 DIAGNOSIS — M546 Pain in thoracic spine: Secondary | ICD-10-CM | POA: Diagnosis not present

## 2020-02-22 DIAGNOSIS — M5030 Other cervical disc degeneration, unspecified cervical region: Secondary | ICD-10-CM | POA: Diagnosis not present

## 2020-02-22 DIAGNOSIS — M542 Cervicalgia: Secondary | ICD-10-CM | POA: Diagnosis not present

## 2020-02-27 DIAGNOSIS — F331 Major depressive disorder, recurrent, moderate: Secondary | ICD-10-CM | POA: Diagnosis not present

## 2020-02-27 DIAGNOSIS — F4312 Post-traumatic stress disorder, chronic: Secondary | ICD-10-CM | POA: Diagnosis not present

## 2020-03-08 DIAGNOSIS — M546 Pain in thoracic spine: Secondary | ICD-10-CM | POA: Diagnosis not present

## 2020-03-08 DIAGNOSIS — M542 Cervicalgia: Secondary | ICD-10-CM | POA: Diagnosis not present

## 2020-03-10 DIAGNOSIS — M542 Cervicalgia: Secondary | ICD-10-CM | POA: Diagnosis not present

## 2020-03-10 DIAGNOSIS — M546 Pain in thoracic spine: Secondary | ICD-10-CM | POA: Diagnosis not present

## 2020-03-18 DIAGNOSIS — M542 Cervicalgia: Secondary | ICD-10-CM | POA: Diagnosis not present

## 2020-03-18 DIAGNOSIS — M546 Pain in thoracic spine: Secondary | ICD-10-CM | POA: Diagnosis not present

## 2020-03-21 DIAGNOSIS — M546 Pain in thoracic spine: Secondary | ICD-10-CM | POA: Diagnosis not present

## 2020-03-21 DIAGNOSIS — M542 Cervicalgia: Secondary | ICD-10-CM | POA: Diagnosis not present

## 2020-03-23 DIAGNOSIS — L820 Inflamed seborrheic keratosis: Secondary | ICD-10-CM | POA: Diagnosis not present

## 2020-03-23 DIAGNOSIS — L570 Actinic keratosis: Secondary | ICD-10-CM | POA: Diagnosis not present

## 2020-03-23 DIAGNOSIS — L821 Other seborrheic keratosis: Secondary | ICD-10-CM | POA: Diagnosis not present

## 2020-03-23 DIAGNOSIS — Z85828 Personal history of other malignant neoplasm of skin: Secondary | ICD-10-CM | POA: Diagnosis not present

## 2020-04-01 DIAGNOSIS — M542 Cervicalgia: Secondary | ICD-10-CM | POA: Diagnosis not present

## 2020-04-01 DIAGNOSIS — M546 Pain in thoracic spine: Secondary | ICD-10-CM | POA: Diagnosis not present

## 2020-04-04 DIAGNOSIS — M546 Pain in thoracic spine: Secondary | ICD-10-CM | POA: Diagnosis not present

## 2020-04-04 DIAGNOSIS — M542 Cervicalgia: Secondary | ICD-10-CM | POA: Diagnosis not present

## 2020-04-19 ENCOUNTER — Inpatient Hospital Stay: Payer: Medicare Other | Attending: Nurse Practitioner

## 2020-04-19 ENCOUNTER — Other Ambulatory Visit: Payer: Self-pay

## 2020-04-19 DIAGNOSIS — E611 Iron deficiency: Secondary | ICD-10-CM | POA: Diagnosis not present

## 2020-04-19 DIAGNOSIS — Z853 Personal history of malignant neoplasm of breast: Secondary | ICD-10-CM | POA: Diagnosis not present

## 2020-04-19 DIAGNOSIS — C50411 Malignant neoplasm of upper-outer quadrant of right female breast: Secondary | ICD-10-CM

## 2020-04-19 DIAGNOSIS — D5 Iron deficiency 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 (chronic): Secondary | ICD-10-CM

## 2020-04-19 LAB — COMPREHENSIVE METABOLIC PANEL
ALT: 15 U/L (ref 0–44)
AST: 14 U/L — ABNORMAL LOW (ref 15–41)
Albumin: 3.7 g/dL (ref 3.5–5.0)
Alkaline Phosphatase: 92 U/L (ref 38–126)
Anion gap: 5 (ref 5–15)
BUN: 13 mg/dL (ref 8–23)
CO2: 25 mmol/L (ref 22–32)
Calcium: 9.3 mg/dL (ref 8.9–10.3)
Chloride: 109 mmol/L (ref 98–111)
Creatinine, Ser: 0.76 mg/dL (ref 0.44–1.00)
GFR calc Af Amer: >60 mL/min (ref >60)
GFR calc non Af Amer: >60 mL/min (ref >60)
Glucose, Bld: 104 mg/dL — ABNORMAL HIGH (ref 70–99)
Potassium: 4.5 mmol/L (ref 3.5–5.1)
Sodium: 139 mmol/L (ref 135–145)
Total Bilirubin: 0.3 mg/dL (ref 0.3–1.2)
Total Protein: 7.6 g/dL (ref 6.5–8.1)

## 2020-04-19 LAB — CBC WITH DIFFERENTIAL/PLATELET
Abs Immature Granulocytes: 0.03 10*3/uL (ref 0.00–0.07)
Basophils Absolute: 0.1 10*3/uL (ref 0.0–0.1)
Basophils Relative: 1 %
Eosinophils Absolute: 0.3 10*3/uL (ref 0.0–0.5)
Eosinophils Relative: 4 %
HCT: 32.3 % — ABNORMAL LOW (ref 36.0–46.0)
Hemoglobin: 9.7 g/dL — ABNORMAL LOW (ref 12.0–15.0)
Immature Granulocytes: 0 %
Lymphocytes Relative: 28 %
Lymphs Abs: 2.5 10*3/uL (ref 0.7–4.0)
MCH: 25.1 pg — ABNORMAL LOW (ref 26.0–34.0)
MCHC: 30 g/dL (ref 30.0–36.0)
MCV: 83.5 fL (ref 80.0–100.0)
Monocytes Absolute: 0.6 10*3/uL (ref 0.1–1.0)
Monocytes Relative: 6 %
Neutro Abs: 5.5 10*3/uL (ref 1.7–7.7)
Neutrophils Relative %: 61 %
Platelets: 398 10*3/uL (ref 150–400)
RBC: 3.87 MIL/uL (ref 3.87–5.11)
RDW: 14.5 % (ref 11.5–15.5)
WBC: 9.1 10*3/uL (ref 4.0–10.5)
nRBC: 0 % (ref 0.0–0.2)

## 2020-04-19 LAB — IRON AND TIBC
Iron: 23 ug/dL — ABNORMAL LOW (ref 41–142)
Saturation Ratios: 5 % — ABNORMAL LOW (ref 21–57)
TIBC: 464 ug/dL — ABNORMAL HIGH (ref 236–444)
UIBC: 441 ug/dL — ABNORMAL HIGH (ref 120–384)

## 2020-04-19 LAB — FERRITIN: Ferritin: 6 ng/mL — ABNORMAL LOW (ref 11–307)

## 2020-04-20 ENCOUNTER — Ambulatory Visit: Payer: Medicare Other | Admitting: Podiatry

## 2020-05-20 DIAGNOSIS — L820 Inflamed seborrheic keratosis: Secondary | ICD-10-CM | POA: Diagnosis not present

## 2020-05-20 DIAGNOSIS — Z85828 Personal history of other malignant neoplasm of skin: Secondary | ICD-10-CM | POA: Diagnosis not present

## 2020-05-20 DIAGNOSIS — L821 Other seborrheic keratosis: Secondary | ICD-10-CM | POA: Diagnosis not present

## 2020-05-25 DIAGNOSIS — R0600 Dyspnea, unspecified: Secondary | ICD-10-CM | POA: Diagnosis not present

## 2020-05-31 DIAGNOSIS — J42 Unspecified chronic bronchitis: Secondary | ICD-10-CM | POA: Diagnosis not present

## 2020-05-31 DIAGNOSIS — M85859 Other specified disorders of bone density and structure, unspecified thigh: Secondary | ICD-10-CM | POA: Diagnosis not present

## 2020-05-31 DIAGNOSIS — E559 Vitamin D deficiency, unspecified: Secondary | ICD-10-CM | POA: Diagnosis not present

## 2020-05-31 DIAGNOSIS — D509 Iron deficiency anemia, unspecified: Secondary | ICD-10-CM | POA: Diagnosis not present

## 2020-05-31 DIAGNOSIS — R0602 Shortness of breath: Secondary | ICD-10-CM | POA: Diagnosis not present

## 2020-06-02 ENCOUNTER — Other Ambulatory Visit (HOSPITAL_COMMUNITY): Payer: Self-pay | Admitting: Radiology

## 2020-06-02 DIAGNOSIS — R0602 Shortness of breath: Secondary | ICD-10-CM

## 2020-06-02 DIAGNOSIS — J42 Unspecified chronic bronchitis: Secondary | ICD-10-CM

## 2020-06-03 ENCOUNTER — Inpatient Hospital Stay (HOSPITAL_COMMUNITY): Admission: RE | Admit: 2020-06-03 | Payer: Medicare Other | Source: Ambulatory Visit

## 2020-06-04 ENCOUNTER — Other Ambulatory Visit (HOSPITAL_COMMUNITY)
Admission: RE | Admit: 2020-06-04 | Discharge: 2020-06-04 | Disposition: A | Discharge status: Home / Self Care | Payer: Medicare Other | Source: Ambulatory Visit | Attending: Family Medicine | Admitting: Family Medicine

## 2020-06-04 DIAGNOSIS — Z01812 Encounter for preprocedural laboratory examination: Secondary | ICD-10-CM | POA: Diagnosis not present

## 2020-06-04 DIAGNOSIS — Z20822 Contact with and (suspected) exposure to covid-19: Secondary | ICD-10-CM | POA: Insufficient documentation

## 2020-06-04 LAB — SARS CORONAVIRUS 2 (TAT 6-24 HRS): SARS Coronavirus 2: NEGATIVE

## 2020-06-06 ENCOUNTER — Ambulatory Visit (HOSPITAL_COMMUNITY)
Admission: RE | Admit: 2020-06-06 | Discharge: 2020-06-06 | Disposition: A | Discharge status: Home / Self Care | Payer: Medicare Other | Source: Ambulatory Visit | Attending: Family Medicine | Admitting: Family Medicine

## 2020-06-06 ENCOUNTER — Ambulatory Visit: Payer: Medicare Other | Admitting: Internal Medicine

## 2020-06-06 ENCOUNTER — Encounter: Payer: Self-pay | Admitting: Internal Medicine

## 2020-06-06 ENCOUNTER — Other Ambulatory Visit: Payer: Self-pay

## 2020-06-06 VITALS — BP 148/72 | HR 87 | Ht 65.5 in | Wt 182.4 lb

## 2020-06-06 DIAGNOSIS — R0600 Dyspnea, unspecified: Secondary | ICD-10-CM

## 2020-06-06 DIAGNOSIS — G4733 Obstructive sleep apnea (adult) (pediatric): Secondary | ICD-10-CM

## 2020-06-06 DIAGNOSIS — I10 Essential (primary) hypertension: Secondary | ICD-10-CM | POA: Diagnosis not present

## 2020-06-06 DIAGNOSIS — J42 Unspecified chronic bronchitis: Secondary | ICD-10-CM | POA: Insufficient documentation

## 2020-06-06 DIAGNOSIS — R0602 Shortness of breath: Secondary | ICD-10-CM | POA: Insufficient documentation

## 2020-06-06 DIAGNOSIS — R0609 Other forms of dyspnea: Secondary | ICD-10-CM

## 2020-06-06 LAB — PULMONARY FUNCTION TEST
DL/VA % pred: 62 %
DL/VA: 2.54 ml/min/mmHg/L
DLCO unc % pred: 46 %
DLCO unc: 9.45 ml/min/mmHg
FEF 25-75 Post: 1.74 L/sec
FEF 25-75 Pre: 1.65 L/sec
FEF2575-%Change-Post: 5 %
FEF2575-%Pred-Post: 91 %
FEF2575-%Pred-Pre: 87 %
FEV1-%Change-Post: -2 %
FEV1-%Pred-Post: 79 %
FEV1-%Pred-Pre: 81 %
FEV1-Post: 1.87 L
FEV1-Pre: 1.91 L
FEV1FVC-%Change-Post: 1 %
FEV1FVC-%Pred-Pre: 107 %
FEV6-%Change-Post: -3 %
FEV6-%Pred-Post: 76 %
FEV6-%Pred-Pre: 79 %
FEV6-Post: 2.27 L
FEV6-Pre: 2.35 L
FEV6FVC-%Change-Post: 0 %
FEV6FVC-%Pred-Post: 104 %
FEV6FVC-%Pred-Pre: 104 %
FVC-%Change-Post: -3 %
FVC-%Pred-Post: 73 %
FVC-%Pred-Pre: 75 %
FVC-Post: 2.27 L
FVC-Pre: 2.35 L
Post FEV1/FVC ratio: 83 %
Post FEV6/FVC ratio: 100 %
Pre FEV1/FVC ratio: 81 %
Pre FEV6/FVC Ratio: 100 %
RV % pred: 85 %
RV: 1.98 L
TLC % pred: 82 %
TLC: 4.38 L

## 2020-06-06 MED ORDER — ALBUTEROL SULFATE (2.5 MG/3ML) 0.083% IN NEBU
2.5000 mg | INHALATION_SOLUTION | Freq: Once | RESPIRATORY_TRACT | Status: AC
  Administered 2020-06-06: 2.5 mg via RESPIRATORY_TRACT

## 2020-06-06 NOTE — Progress Notes (Signed)
Cardiology Office Note:    Date:  06/06/2020   ID:  Jessica Roberts, DOB 26-Jun-1946, MRN [id number]  PCP:  Cari Caraway, MD  Cardiologist:  No primary care provider on file.  Electrophysiologist:  None   Referring MD: Cari Caraway, MD   Chief Complaint/Reason for Referral: DOE  History of Present Illness:    Jessica Roberts is a 74 y.o. female with a history of right breast cancer with radiation and surgery.   Up stairs or up hill 8 stairs. DOE. Went hiking with friends and got short of breath hiking.   14 steps had to sit. 1 minute to recover.   Sob and heart beating faster. No dizzy or lightheaded.   1 year of symptoms and worsening. Felt it was due to deconditioning.   OSA, has rx for cpap - not tolerating mask.   No signficant swelling, some ankle edema.   28 years of smoking, 1.5 ppd.   The patient denies chest pain, chest pressure, dyspnea at rest, palpitations, PND, orthopnea, or leg swelling. Denies cough, fever, chills. Denies nausea, vomiting. Denies syncope or presyncope. Denies dizziness or lightheadedness.    Past Medical History:  Diagnosis Date  . Allergy   . Anxiety   . Anxiety and depression   . Breast cancer ([place]) 04/07/15   right breast  . Breast cancer of upper-outer quadrant of right female breast ([place]) 04/11/2015  . Broken nose   . Complication of anesthesia    "had a very hard time waking up"  . Dengue fever   . Depression   . Fall    broken nose  . GERD (gastroesophageal reflux disease)   . Head injury 1995   concussion in [place], med evac to [place], ear damage with vertigo  . Head injury, acute, with loss of consciousness ([place]) 2000   out of the country  . Head [place] closed, with concussion 1986   [place]  . Headache   . Hypertension   . Malaria   . PONV (postoperative nausea and vomiting)   . Typhoid     Past Surgical History:  Procedure Laterality Date  . BREAST LUMPECTOMY Right 04/27/2015  .  BREAST LUMPECTOMY WITH NEEDLE LOCALIZATION AND AXILLARY SENTINEL LYMPH NODE BX Right 04/27/2015   Procedure: RIGHT BREAST LUMPECTOMY WITH  TWO NEEDLE LOCALIZATION AND TWO RIGHT AXILLARY SENTINEL LYMPH NODE BX;  Surgeon: Fanny Skates, MD;  Location: [facility];  Service: General;  Laterality: Right;  . COLONOSCOPY W/ BIOPSIES AND POLYPECTOMY    . NERVE SURGERY     '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' during surgery for endometriosis  . surgical procedure for endometriosis    . TONSILLECTOMY      Current Medications: Current Meds  Medication Sig  . acetaminophen (TYLENOL) 500 MG tablet Take 500 mg by mouth 2 (two) times daily as needed for moderate pain (pain). Reported on 10/12/2015  . albuterol (PROVENTIL HFA;VENTOLIN HFA) 108 (90 Base) MCG/ACT inhaler Inhale 1 puff into the lungs every 6 (six) hours as needed for wheezing or shortness of breath.  Jearl Klinefelter ELLIPTA 62.5-25 MCG/INH AEPB Inhale 1 puff into the lungs daily.  . ARIPiprazole (ABILIFY) 2 MG tablet Take 2 mg by mouth daily.  . diazepam (VALIUM) 5 MG tablet Take 2.5 mg by mouth daily as needed for anxiety.   . Esomeprazole Magnesium (NEXIUM PO) Take 22.3 mg by mouth daily.   . metroNIDAZOLE (METROGEL) 0.75 % gel metronidazole 0.75 % topical gel  . Multiple Vitamin (  MULTIVITAMIN WITH MINERALS) TABS tablet Take 1 tablet by mouth at bedtime.  . sertraline (ZOLOFT) 100 MG tablet Take 100 mg by mouth daily.      Allergies:   Codeine, Oxycontin [oxycodone hcl], Percocet [oxycodone-acetaminophen], and Percodan [oxycodone-aspirin]   Social History   Tobacco Use  . Smoking status: Former Smoker    Years: 30.00    Types: Cigarettes    Quit date: 01/11/1994    Years since quitting: 26.4  . Smokeless tobacco: Never Used  Substance Use Topics  . Alcohol use: Yes    Alcohol/week: 4.0 standard drinks    Types: 4 Glasses of wine per week    Comment: moderate   . Drug use: No     Family History: The patient's family history includes Alcohol  abuse in her paternal grandfather; Breast cancer in her mother; Diabetes in her maternal grandmother; Emphysema in her father; Heart disease in her paternal grandmother; Hypertension in her mother; Thyroid disease in her mother.  ROS:   Please see the history of present illness.    All other systems reviewed and are negative.  EKGs/Labs/Other Studies Reviewed:    The following studies were reviewed today:  EKG:  NSR, possible LAE, LVH    Recent Labs: 04/19/2020: ALT 15; BUN 13; Creatinine, Ser 0.76; Hemoglobin 9.7; Platelets 398; Potassium 4.5; Sodium 139  Recent Lipid Panel No results found for: CHOL, TRIG, HDL, CHOLHDL, VLDL, LDLCALC, LDLDIRECT  Physical Exam:    VS:  BP (!) 148/72   Pulse 87   Ht 5' 5.5" (1.664 m)   Wt 182 lb 6.4 oz (82.7 kg)   BMI 29.89 kg/m     Wt Readings from Last 5 Encounters:  06/06/20 182 lb 6.4 oz (82.7 kg)  01/18/20 183 lb 9.6 oz (83.3 kg)  07/16/18 183 lb 8 oz (83.2 kg)  01/03/18 183 lb 1.6 oz (83.1 kg)  04/01/17 175 lb 9.6 oz (79.7 kg)    Constitutional: No acute distress Eyes: sclera non-icteric, normal conjunctiva and lids ENMT: normal dentition, moist mucous membranes Cardiovascular: regular rhythm, normal rate, no murmurs. S1 and S2 normal. Radial pulses normal bilaterally. No jugular venous distention.  Respiratory: clear to auscultation bilaterally GI : normal bowel sounds, soft and nontender. No distention.   MSK: extremities warm, well perfused. No edema.  NEURO: grossly nonfocal exam, moves all extremities. PSYCH: alert and oriented x 3, normal mood and affect.   ASSESSMENT:    1. DOE (dyspnea on exertion)   2. OSA (obstructive sleep apnea)   3. Essential hypertension    PLAN:    We discussed ischemic evaluation for chest pain. She has DOE as anginal equivalent, concern for obstructive CAD. Will perform CCTA.   Will also perform echo to evaluate diastolic function and eval for pulmonary HTN.   Will follow up after tests.    Total time of encounter: 60 minutes total time of encounter, including 40 minutes spent in face-to-face patient care on the date of this encounter. This time includes coordination of care and counseling regarding above mentioned problem list. Remainder of non-face-to-face time involved reviewing chart documents/testing relevant to the patient encounter and documentation in the medical record. I have independently reviewed documentation from referring provider.   Cherlynn Kaiser, MD [place]  [facility]    Medication Adjustments/Labs and Tests Ordered: Current medicines are reviewed at length with the patient today.  Concerns regarding medicines are outlined above.   Orders Placed This Encounter  Procedures  . CT CORONARY  MORPH W/CTA COR W/SCORE W/CA W/CM &/OR WO/CM  . CT CORONARY FRACTIONAL FLOW RESERVE DATA PREP  . CT CORONARY FRACTIONAL FLOW RESERVE FLUID ANALYSIS  . Basic metabolic panel  . EKG 12-Lead  . ECHOCARDIOGRAM COMPLETE    Meds ordered this encounter  Medications  . metoprolol tartrate (LOPRESSOR) 100 MG tablet    Sig: Take 1 tablet (100 mg total) by mouth once for 1 dose. PLEASE TAKE $RemoveBef'100mg'[id number]$  METOPROLOL TARTRATE 2 HOURS PRIOR TO CTA SCAN    Dispense:  1 tablet    Refill:  0    Patient Instructions  Medication Instructions:  No Changes In Medications at this time.  *If you need a refill on your cardiac medications before your next appointment, please call your pharmacy*  Lab Work: BMET (Blood Work) 1 week prior to CTA scan. Please return to office for this.  If you have labs (blood work) drawn today and your tests are completely normal, you will receive your results only by: Marland Kitchen MyChart Message (if you have MyChart) OR . A paper copy in the mail If you have any lab test that is abnormal or we need to change your treatment, we will call you to review the results.  Testing/Procedures: Your physician has requested that you have cardiac CT. Cardiac computed  tomography (CT) is a painless test that uses an x-ray machine to take clear, detailed pictures of your heart. For further information please visit HugeFiesta[facility]tn. Please follow instruction sheet as given.  Your physician has requested that you have an echocardiogram. Echocardiography is a painless test that uses sound waves to create images of your heart. It provides your doctor with information about the size and shape of your heart and how well your heart's chambers and valves are working. You may receive an ultrasound enhancing agent through an IV if needed to better visualize your heart during the echo.This procedure takes approximately one hour. There are no restrictions for this procedure. This will take place at the [address].   Follow-Up: At [facility], you and your health needs are our priority.  As part of our continuing mission to provide you with exceptional heart care, we have created designated Provider Care Teams.  These Care Teams include your primary Cardiologist (physician) and Advanced Practice Providers (APPs -  Physician Assistants and Nurse Practitioners) who all work together to provide you with the care you need, when you need it.  Your next appointment:   1 month(s)  The format for your next appointment:   In Person  Provider:   Cherlynn Kaiser, MD   OTHER INFORMATION  Your cardiac CT will be scheduled at the below locations:   [facility] [address] [place],  [address] [phone]  If scheduled at [facility], please arrive at the [facility] main entrance of [facility] 30 minutes prior to test start time. Proceed to the [facility] Radiology Department (first floor) to check-in and test prep.  Please follow these instructions carefully (unless otherwise directed):   On the Night Before the Test: . Be sure to Drink plenty of water. . Do not consume any caffeinated/decaffeinated beverages  or chocolate 12 hours prior to your test. . Do not take any antihistamines 12 hours prior to your test.  On the Day of the Test: . Drink plenty of water. Do not drink any water within one hour of the test. . Do not eat any food 4 hours prior to  the test. . You may take your regular medications prior to the test.  . Take Metoprolol Tartrate 100mg  tablet  (Lopressor) two hours prior to test. . FEMALES- please wear underwire-free bra if available  After the Test: . Drink plenty of water. . After receiving IV contrast, you may experience a mild flushed feeling. This is normal. . On occasion, you may experience a mild rash up to 24 hours after the test. This is not dangerous. If this occurs, you can take Benadryl 25 mg and increase your fluid intake. . If you experience trouble breathing, this can be serious. If it is severe call 911 IMMEDIATELY. If it is mild, please call our office. . If you take any of these medications: Glipizide/Metformin, Avandament, Glucavance, please do not take 48 hours after completing test unless otherwise instructed.  Once we have confirmed authorization from your insurance company, we will call you to set up a date and time for your test. Based on how quickly your insurance processes prior authorizations requests, please allow up to 4 weeks to be contacted for scheduling your Cardiac CT appointment. Be advised that routine Cardiac CT appointments could be scheduled as many as 8 weeks after your provider has ordered it.  For non-scheduling related questions, please contact the cardiac imaging nurse navigator should you have any questions/concerns: Marchia Bond, Cardiac Imaging Nurse Navigator Burley Saver, Interim Cardiac Imaging Nurse [place] and Vascular Services Direct Office Dial: [phone]   For scheduling needs, including cancellations and rescheduling, please call Vivien Rota at [phone], option 3.

## 2020-06-06 NOTE — Patient Instructions (Addendum)
Medication Instructions:  No Changes In Medications at this time.  *If you need a refill on your cardiac medications before your next appointment, please call your pharmacy*  Lab Work: BMET (Blood Work) 1 week prior to CTA scan. Please return to office for this.  If you have labs (blood work) drawn today and your tests are completely normal, you will receive your results only by: Marland Kitchen MyChart Message (if you have MyChart) OR . A paper copy in the mail If you have any lab test that is abnormal or we need to change your treatment, we will call you to review the results.  Testing/Procedures: Your physician has requested that you have cardiac CT. Cardiac computed tomography (CT) is a painless test that uses an x-ray machine to take clear, detailed pictures of your heart. For further information please visit HugeFiesta[employer]tn. Please follow instruction sheet as given.  Your physician has requested that you have an echocardiogram. Echocardiography is a painless test that uses sound waves to create images of your heart. It provides your doctor with information about the size and shape of your heart and how well your heart's chambers and valves are working. You may receive an ultrasound enhancing agent through an IV if needed to better visualize your heart during the echo.This procedure takes approximately one hour. There are no restrictions for this procedure. This will take place at the [address].   Follow-Up: At [facility], you and your health needs are our priority.  As part of our continuing mission to provide you with exceptional heart care, we have created designated Provider Care Teams.  These Care Teams include your primary Cardiologist (physician) and Advanced Practice Providers (APPs -  Physician Assistants and Nurse Practitioners) who all work together to provide you with the care you need, when you need it.  Your next appointment:   1 month(s)  The format for your  next appointment:   In Person  Provider:   Cherlynn Kaiser, MD   OTHER INFORMATION  Your cardiac CT will be scheduled at the below locations:   [facility] [address] [place], [place] [address] [phone]  If scheduled at [facility], please arrive at the [facility] main entrance of [facility] 30 minutes prior to test start time. Proceed to the [facility] Radiology Department (first floor) to check-in and test prep.  Please follow these instructions carefully (unless otherwise directed):   On the Night Before the Test: . Be sure to Drink plenty of water. . Do not consume any caffeinated/decaffeinated beverages or chocolate 12 hours prior to your test. . Do not take any antihistamines 12 hours prior to your test.  On the Day of the Test: . Drink plenty of water. Do not drink any water within one hour of the test. . Do not eat any food 4 hours prior to the test. . You may take your regular medications prior to the test.  . Take Metoprolol Tartrate 100mg  tablet  (Lopressor) two hours prior to test. . FEMALES- please wear underwire-free bra if available  After the Test: . Drink plenty of water. . After receiving IV contrast, you may experience a mild flushed feeling. This is normal. . On occasion, you may experience a mild rash up to 24 hours after the test. This is not dangerous. If this occurs, you can take Benadryl 25 mg and increase your fluid intake. . If you experience trouble breathing, this can be serious.  If it is severe call 911 IMMEDIATELY. If it is mild, please call our office. . If you take any of these medications: Glipizide/Metformin, Avandament, Glucavance, please do not take 48 hours after completing test unless otherwise instructed.  Once we have confirmed authorization from your insurance company, we will call you to set up a date and time for your test. Based on how quickly your insurance processes prior authorizations  requests, please allow up to 4 weeks to be contacted for scheduling your Cardiac CT appointment. Be advised that routine Cardiac CT appointments could be scheduled as many as 8 weeks after your provider has ordered it.  For non-scheduling related questions, please contact the cardiac imaging nurse navigator should you have any questions/concerns: Marchia Bond, Cardiac Imaging Nurse Navigator Burley Saver, Interim Cardiac Imaging Nurse [place] and Vascular Services Direct Office Dial: [phone]   For scheduling needs, including cancellations and rescheduling, please call Vivien Rota at [phone], option 3.

## 2020-06-15 MED ORDER — METOPROLOL TARTRATE 100 MG PO TABS
100.0000 mg | ORAL_TABLET | Freq: Once | ORAL | 0 refills | Status: DC
Start: 2020-06-15 — End: 2020-07-22

## 2020-06-20 ENCOUNTER — Other Ambulatory Visit (HOSPITAL_COMMUNITY): Payer: Medicare Other

## 2020-06-22 DIAGNOSIS — Z85828 Personal history of other malignant neoplasm of skin: Secondary | ICD-10-CM | POA: Diagnosis not present

## 2020-06-22 DIAGNOSIS — L821 Other seborrheic keratosis: Secondary | ICD-10-CM | POA: Diagnosis not present

## 2020-06-22 DIAGNOSIS — L918 Other hypertrophic disorders of the skin: Secondary | ICD-10-CM | POA: Diagnosis not present

## 2020-06-23 ENCOUNTER — Inpatient Hospital Stay (HOSPITAL_COMMUNITY): Admission: RE | Admit: 2020-06-23 | Payer: Medicare Other | Source: Ambulatory Visit

## 2020-06-28 ENCOUNTER — Ambulatory Visit (HOSPITAL_COMMUNITY): Payer: Medicare Other | Attending: Cardiovascular Disease

## 2020-06-28 ENCOUNTER — Other Ambulatory Visit: Payer: Self-pay

## 2020-06-28 DIAGNOSIS — R0609 Other forms of dyspnea: Secondary | ICD-10-CM

## 2020-06-28 DIAGNOSIS — R0600 Dyspnea, unspecified: Secondary | ICD-10-CM

## 2020-06-28 LAB — ECHOCARDIOGRAM COMPLETE
Area-P 1/2: 3.61 cm2
S' Lateral: 1.85 cm

## 2020-07-01 ENCOUNTER — Telehealth (HOSPITAL_COMMUNITY): Payer: Self-pay | Admitting: Emergency Medicine

## 2020-07-01 NOTE — Telephone Encounter (Signed)
Attempted to call patient regarding upcoming cardiac CT appointment. °Left message on voicemail with name and callback number °Kristoff Coonradt RN Navigator Cardiac Imaging ° Heart and Vascular Services °[phone] Office °[phone] Cell ° °

## 2020-07-03 DIAGNOSIS — F331 Major depressive disorder, recurrent, moderate: Secondary | ICD-10-CM | POA: Diagnosis not present

## 2020-07-03 DIAGNOSIS — F4312 Post-traumatic stress disorder, chronic: Secondary | ICD-10-CM | POA: Diagnosis not present

## 2020-07-04 ENCOUNTER — Ambulatory Visit: Payer: Medicare Other | Admitting: Internal Medicine

## 2020-07-04 ENCOUNTER — Other Ambulatory Visit: Payer: Self-pay | Admitting: Internal Medicine

## 2020-07-05 ENCOUNTER — Other Ambulatory Visit: Payer: Self-pay

## 2020-07-05 ENCOUNTER — Ambulatory Visit (HOSPITAL_COMMUNITY)
Admission: RE | Admit: 2020-07-05 | Discharge: 2020-07-05 | Disposition: A | Discharge status: Home / Self Care | Payer: Medicare Other | Source: Ambulatory Visit | Attending: Internal Medicine | Admitting: Internal Medicine

## 2020-07-05 DIAGNOSIS — R0600 Dyspnea, unspecified: Secondary | ICD-10-CM | POA: Insufficient documentation

## 2020-07-05 DIAGNOSIS — I700 Atherosclerosis of aorta: Secondary | ICD-10-CM | POA: Insufficient documentation

## 2020-07-05 DIAGNOSIS — I2510 Atherosclerotic heart disease of native coronary artery without angina pectoris: Secondary | ICD-10-CM | POA: Insufficient documentation

## 2020-07-05 DIAGNOSIS — R0609 Other forms of dyspnea: Secondary | ICD-10-CM

## 2020-07-05 DIAGNOSIS — K449 Diaphragmatic hernia without obstruction or gangrene: Secondary | ICD-10-CM | POA: Insufficient documentation

## 2020-07-05 LAB — POCT I-STAT CREATININE: Creatinine, Ser: 0.7 mg/dL (ref 0.44–1.00)

## 2020-07-05 MED ORDER — NITROGLYCERIN 0.4 MG SL SUBL
0.8000 mg | SUBLINGUAL_TABLET | Freq: Once | SUBLINGUAL | Status: AC
  Administered 2020-07-05: 0.8 mg via SUBLINGUAL

## 2020-07-05 MED ORDER — NITROGLYCERIN 0.4 MG SL SUBL
SUBLINGUAL_TABLET | SUBLINGUAL | Status: AC
  Filled 2020-07-05: qty 2

## 2020-07-05 MED ORDER — IOHEXOL 350 MG/ML SOLN
80.0000 mL | Freq: Once | INTRAVENOUS | Status: AC | PRN
  Administered 2020-07-05: 80 mL via INTRAVENOUS

## 2020-07-06 ENCOUNTER — Ambulatory Visit (HOSPITAL_COMMUNITY)
Admission: RE | Admit: 2020-07-06 | Discharge: 2020-07-06 | Disposition: A | Discharge status: Home / Self Care | Payer: Medicare Other | Source: Ambulatory Visit | Attending: Internal Medicine | Admitting: Internal Medicine

## 2020-07-06 DIAGNOSIS — I2510 Atherosclerotic heart disease of native coronary artery without angina pectoris: Secondary | ICD-10-CM | POA: Insufficient documentation

## 2020-07-06 DIAGNOSIS — R0609 Other forms of dyspnea: Secondary | ICD-10-CM

## 2020-07-06 DIAGNOSIS — I700 Atherosclerosis of aorta: Secondary | ICD-10-CM | POA: Diagnosis not present

## 2020-07-06 DIAGNOSIS — R0600 Dyspnea, unspecified: Secondary | ICD-10-CM | POA: Insufficient documentation

## 2020-07-11 ENCOUNTER — Other Ambulatory Visit (HOSPITAL_COMMUNITY)
Admission: RE | Admit: 2020-07-11 | Discharge: 2020-07-11 | Disposition: A | Discharge status: Home / Self Care | Payer: Medicare Other | Source: Ambulatory Visit | Attending: Gastroenterology | Admitting: Gastroenterology

## 2020-07-11 DIAGNOSIS — Z20822 Contact with and (suspected) exposure to covid-19: Secondary | ICD-10-CM | POA: Insufficient documentation

## 2020-07-11 DIAGNOSIS — Z01812 Encounter for preprocedural laboratory examination: Secondary | ICD-10-CM | POA: Insufficient documentation

## 2020-07-11 LAB — SARS CORONAVIRUS 2 (TAT 6-24 HRS): SARS Coronavirus 2: NEGATIVE

## 2020-07-12 DIAGNOSIS — Z1159 Encounter for screening for other viral diseases: Secondary | ICD-10-CM | POA: Diagnosis not present

## 2020-07-14 ENCOUNTER — Telehealth: Payer: Self-pay | Admitting: Internal Medicine

## 2020-07-14 DIAGNOSIS — Z86010 Personal history of colonic polyps: Secondary | ICD-10-CM | POA: Diagnosis not present

## 2020-07-14 DIAGNOSIS — K449 Diaphragmatic hernia without obstruction or gangrene: Secondary | ICD-10-CM | POA: Diagnosis not present

## 2020-07-14 DIAGNOSIS — D509 Iron deficiency anemia, unspecified: Secondary | ICD-10-CM | POA: Diagnosis not present

## 2020-07-14 DIAGNOSIS — Z79899 Other long term (current) drug therapy: Secondary | ICD-10-CM

## 2020-07-14 DIAGNOSIS — K2930 Chronic superficial gastritis without bleeding: Secondary | ICD-10-CM | POA: Diagnosis not present

## 2020-07-14 DIAGNOSIS — I25119 Atherosclerotic heart disease of native coronary artery with unspecified angina pectoris: Secondary | ICD-10-CM

## 2020-07-14 DIAGNOSIS — K621 Rectal polyp: Secondary | ICD-10-CM | POA: Diagnosis not present

## 2020-07-14 MED ORDER — ATORVASTATIN CALCIUM 40 MG PO TABS
20.0000 mg | ORAL_TABLET | Freq: Every day | ORAL | 6 refills | Status: DC
Start: 2020-07-14 — End: 2020-07-22

## 2020-07-14 NOTE — Telephone Encounter (Signed)
    Pt is returning call to get CT result 

## 2020-07-14 NOTE — Telephone Encounter (Signed)
Elouise Munroe, MD 07/05/2020 10:39 PM EST Overall mild CAD, one possible lesions that will be processed further, final results will be available tomorrow. Large hiatal hernia, she may want to discuss this further with Dr. Addison Lank. We can also review at follow up Elouise Munroe, MD 07/12/2020 7:41 PM EST CT FFR shows a blood flow limiting blockage in the Obtuse Marginal 1 and distal LAD. We will discuss medical management of CAD at our next visit. Would recommend a daily ASA 81 mg and atorvastatin 40 mg daily. We can start these now or after discussing at our follow up visit.  Eliezer Lofts, if you are able to add on a BNP to her most recent set of labs please do.  Pt informed of providers result & recommendations above. Pt verbalized understanding.she will wait to discuss further at her upcoming virtual appt. She will start the Atorva and the ASA 81mg  today and have BNP done Monday, she is having a colonoscopy today and should wait until Monday to have lab done. Informed that BNP is not fasting.verbalized understanding.

## 2020-07-15 NOTE — Progress Notes (Signed)
[facility]   [phone] [phone]   Clinic Follow up Note   Patient Care Team: Gweneth Dimitri, MD as PCP - General (Family Medicine) Claud Kelp, MD as Consulting Physician (General Surgery) Malachy Mood, MD as Consulting Physician (Hematology) Lurline Hare, MD as Consulting Physician (Radiation Oncology) Pershing Proud, RN as Registered Nurse Donnelly Angelica, RN as Registered Nurse Damita Lack, Marcy Salvo, NP as Nurse Practitioner (Nurse Practitioner) Charlott Rakes, MD as Consulting Physician (Gastroenterology)  Date of Service:  07/18/2020  CHIEF COMPLAINT: F/u of right breast cancer   SUMMARY OF ONCOLOGIC HISTORY: Oncology History Overview Note  Cancer Staging Breast cancer of upper-outer quadrant of right female breast [facility]) Staging form: Breast, AJCC 7th Edition - Clinical stage from 04/07/2015: Stage IA (T1a, N0, M0) - Signed by Malachy Mood, MD on 05/20/2015 - Pathologic stage from 04/27/2015: Stage IA (T1b, N0, cM0) - Signed by Malachy Mood, MD on 05/20/2015       Breast cancer of upper-outer quadrant of right female breast (HCC)  04/01/2015 Mammogram   Diagnostic mammogram and US showed a 0.7x 0.4 x 0.4 cm mass in the right breast 11:00 position, and a second 71mm lesion at upper-midline, and a benign cluster of cysts measuring 1cm.    04/07/2015 Initial Biopsy   Right breast UOQ mass core needle biopsy showed invasive ductal carcinoma and DCIS, grade 1. ER+ (100%), PR+ (80%), HER2/neu negative (ratio 1.25), Ki67 5%.   04/07/2015 Clinical Stage   Stage IA: T1a N0   04/27/2015 Definitive Surgery   Right breast double lumpectomy/SLNB: IDC, 0.9 cm, DCIS, invasive dz 0.1 cm from posterior margin, rem. margins >0.5 cm; second lump: fibrocystic change. 1 LN negative for malignancy   04/27/2015 Pathologic Stage   Stage IA: T1b N0   06/21/2015 - 07/13/2015 Radiation Therapy   Adjuvant RT: Right breast 42.72 Gy over 21 fractions.      Anti-estrogen oral therapy   Pt declined adjuvant anti-estrogen therapy    09/29/2015 Survivorship   Survivorship visit completed and copy of care plan given to patient.   07/17/2016 Imaging   MM DIAG BREAST TOMO BILATERALL 07/17/16 MPRESSION: Probable mildly complicated fluid collection/seroma within the right breast 1130 o'clock along the posterior margin of the lumpectomy site.   09/04/2016 Imaging   US BREAST LTD UNI RIGHT INC AXILLA: 09/04/16 IMPRESSION: Stable probably benign postsurgical fluid collection/seroma within the right breast at the 11:30 o'clock axis, 6 cm from the nipple, measuring 2.3 x 0.9 x 1.2 cm. Recommend additional follow-up right breast diagnostic mammogram and ultrasound in 6 months to ensure continued stability.   03/21/2017 Mammogram   R breast Mammogram and [place]  Mammographic images were processed with CAD.  Targeted ultrasound is performed, showing slightly smaller mixed echogenicity collection within the right breast at 11:30 o'clock 6 cm from nipple measuring 0.9 x 0.7 x 1.4 cm. This is probably a postsurgical seroma.   07/31/2017 Mammogram   IMPRESSION: 1. Indeterminate 9 mm group of fine pleomorphic calcifications involving the upper outer quadrant of the right breast at posterior depth. 2. New partially obscured mass involving the inner left breast at anterior posterior depth.   08/07/2017 Breast US   Targeted right breast ultrasound is performed, showing the previously identified mildly complex fluid collection at the lumpectomy site at the 11:30 o'clock approximately 6 cm from the nipple has decreased in size in the interval, currently measuring 6 x 8 x 8 mm (previously 7 x 9 by 14 mm). No  new suspicious solid mass or abnormal acoustic shadowing is identified.  Targeted left breast ultrasound is performed, showing that the new mammographic mass corresponds to an oval circumscribed parallel hypoechoic mass at the 10 o'clock position approximately 4 cm  from the nipple measuring approximately 5 x 4 x 5 mm, demonstrating slight posterior acoustic enhancement and no internal power Doppler flow.  IMPRESSION: 1. Interval decrease in size of the postoperative seroma at the lumpectomy site in the upper outer quadrant of the right breast. 2. Likely benign 5 mm complex cyst in the upper inner quadrant of the left breast accounting for the new mammographic finding.   08/07/2017 Procedure   aspiration of the complex cyst in the upper inner quadrant of the left breast at the 10 o'clock position approximately 4 cm from the nipple. Less than 1 cc of cyst fluid was aspirated. The cyst completely collapsed with aspiration and there is no associated solid component.   08/07/2017 Pathology Results   Diagnosis Breast, left, needle core biopsy, UIQ at posterior depth - BENIGN BREAST TISSUE WITH CALCIFICATIONS. - NO MALIGNANCY IDENTIFIED. - SEE COMMENT.    03/10/2018 Mammogram    03/10/2018 Mammogram IMPRESSION: No mammographic evidence of malignancy involving the LEFT breast.      CURRENT THERAPY:  Surveillance    INTERVAL HISTORY:  Jessica Roberts is here for a follow up of right breast cancer. She was last seen by me 2 year ago and seen by NP Lacie 6 months ago in interim. She presents to the clinic alone. She notes she has a heart condition and seeing a cardiologist and plans to see pulmonologist given her breathing issues. She has SOB. She quit smoking in 01/11/1994. She has had colonoscopy and endoscopy. She denies new pain and her breast pain has improved. She has complete PT. She notes having occasional constipation.     REVIEW OF SYSTEMS:  Constitutional: Denies fevers, chills or abnormal weight loss Eyes: Denies blurriness of vision Ears, nose, mouth, throat, and face: Denies mucositis or sore throat Respiratory: Denies cough or wheezes (+) SOB Cardiovascular: Denies palpitation, chest discomfort or lower extremity  swelling Gastrointestinal:  Denies nausea, heartburn (+) Occasional constipation  Skin: Denies abnormal skin rashes Lymphatics: Denies new lymphadenopathy or easy bruising Neurological:Denies numbness, tingling or new weaknesses Behavioral/Psych: Mood is stable, no new changes  All other systems were reviewed with the patient and are negative.  MEDICAL HISTORY:  Past Medical History:  Diagnosis Date  . Allergy   . Anxiety   . Anxiety and depression   . Breast cancer ([place]) 04/07/15   right breast  . Breast cancer of upper-outer quadrant of right female breast ([place]) 04/11/2015  . Broken nose   . Complication of anesthesia    "had a very hard time waking up"  . Dengue fever   . Depression   . Fall    broken nose  . GERD (gastroesophageal reflux disease)   . Head injury 1995   concussion in [place], med evac to [place], ear damage with vertigo  . Head injury, acute, with loss of consciousness ([place]) 2000   [place] of the [place]  . [place]  . Headache   . Hypertension   . Malaria   . PONV (postoperative nausea and vomiting)   . Typhoid     SURGICAL HISTORY: Past Surgical History:  Procedure Laterality Date  . BREAST LUMPECTOMY Right 04/27/2015  . BREAST LUMPECTOMY WITH NEEDLE LOCALIZATION AND  AXILLARY SENTINEL LYMPH NODE BX Right 04/27/2015   Procedure: RIGHT BREAST LUMPECTOMY WITH  TWO NEEDLE LOCALIZATION AND TWO RIGHT AXILLARY SENTINEL LYMPH NODE BX;  Surgeon: Fanny Skates, MD;  Location: [facility];  Service: General;  Laterality: Right;  . COLONOSCOPY W/ BIOPSIES AND POLYPECTOMY    . NERVE SURGERY     '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' during surgery for endometriosis  . surgical procedure for endometriosis    . TONSILLECTOMY      I have reviewed the social history and family history with the patient and they are unchanged from previous note.  ALLERGIES:  is allergic to codeine, oxycontin [oxycodone hcl], percocet  [oxycodone-acetaminophen], and percodan [oxycodone-aspirin].  MEDICATIONS:  Current Outpatient Medications  Medication Sig Dispense Refill  . acetaminophen (TYLENOL) 500 MG tablet Take 500 mg by mouth 2 (two) times daily as needed for moderate pain (pain). Reported on 10/12/2015    . albuterol (PROVENTIL HFA;VENTOLIN HFA) 108 (90 Base) MCG/ACT inhaler Inhale 1 puff into the lungs every 6 (six) hours as needed for wheezing or shortness of breath.    Jearl Klinefelter ELLIPTA 62.5-25 MCG/INH AEPB Inhale 1 puff into the lungs daily.    . ARIPiprazole (ABILIFY) 2 MG tablet Take 2 mg by mouth daily.    Marland Kitchen atorvastatin (LIPITOR) 40 MG tablet Take 0.5 tablets (20 mg total) by mouth daily. 30 tablet 6  . diazepam (VALIUM) 5 MG tablet Take 2.5 mg by mouth daily as needed for anxiety.     . Esomeprazole Magnesium (NEXIUM PO) Take 22.3 mg by mouth daily.     . metoprolol tartrate (LOPRESSOR) 100 MG tablet Take 1 tablet (100 mg total) by mouth once for 1 dose. PLEASE TAKE $RemoveBef'100mg'[id number]$  METOPROLOL TARTRATE 2 HOURS PRIOR TO CTA SCAN 1 tablet 0  . metroNIDAZOLE (METROGEL) 0.75 % gel metronidazole 0.75 % topical gel    . Multiple Vitamin (MULTIVITAMIN WITH MINERALS) TABS tablet Take 1 tablet by mouth at bedtime.    . sertraline (ZOLOFT) 100 MG tablet Take 100 mg by mouth daily.      No current facility-administered medications for this visit.    PHYSICAL EXAMINATION: ECOG PERFORMANCE STATUS: 1 - Symptomatic but completely ambulatory  Vitals:   07/18/20 1336  BP: (!) 152/75  Pulse: 90  Resp: 18  Temp: 97.9 F (36.6 C)  SpO2: 98%   Filed Weights   07/18/20 1336  Weight: 183 lb 6.4 oz (83.2 kg)    GENERAL:alert, no distress and comfortable SKIN: skin color, texture, turgor are normal, no rashes or significant lesions EYES: normal, Conjunctiva are pink and non-injected, sclera clear  NECK: supple, thyroid normal size, non-tender, without nodularity LYMPH:  no palpable lymphadenopathy in the cervical, axillary   LUNGS: clear to auscultation and percussion with normal breathing effort HEART: regular rate & rhythm and no murmurs and no lower extremity edema ABDOMEN:abdomen soft, non-tender and normal bowel sounds (+) Very mild lower abdominal tenderness Musculoskeletal:no cyanosis of digits and no clubbing  NEURO: alert & oriented x 3 with fluent speech, no focal motor/sensory deficits BREAST: s/p right lumpectomy: Surgical incision healed well with scar tissue. No palpable mass, nodules or adenopathy bilaterally. Breast exam benign.   LABORATORY DATA:  I have reviewed the data as listed CBC Latest Ref Rng & Units 07/18/2020 04/19/2020 01/18/2020  WBC 4.0 - 10.5 K/uL 11.0(H) 9.1 9.2  Hemoglobin 12.0 - 15.0 g/dL 9.4(L) 9.7(L) 12.1  Hematocrit 36 - 46 % 32.3(L) 32.3(L) 39.4  Platelets 150 - 400 K/uL 417(H)  398 378     CMP Latest Ref Rng & Units 07/18/2020 07/05/2020 04/19/2020  Glucose 70 - 99 mg/dL 100(H) - 104(H)  BUN 8 - 23 mg/dL 13 - 13  Creatinine 0.44 - 1.00 mg/dL 0.74 0.70 0.76  Sodium 135 - 145 mmol/L 141 - 139  Potassium 3.5 - 5.1 mmol/L 4.1 - 4.5  Chloride 98 - 111 mmol/L 107 - 109  CO2 22 - 32 mmol/L 27 - 25  Calcium 8.9 - 10.3 mg/dL 9.6 - 9.3  Total Protein 6.5 - 8.1 g/dL 7.6 - 7.6  Total Bilirubin 0.3 - 1.2 mg/dL 0.4 - 0.3  Alkaline Phos 38 - 126 U/L 89 - 92  AST 15 - 41 U/L 12(L) - 14(L)  ALT 0 - 44 U/L 13 - 15      RADIOGRAPHIC STUDIES: I have personally reviewed the radiological images as listed and agreed with the findings in the report. No results found.   ASSESSMENT & PLAN:  Jessica Roberts is a 74 y.o. female with    1. Right breast invasive ductal carcinoma, pT1bN0M0, stage IA, grade 1, ER positive, PR positive, HER-2 negative, (+) DCIS and ADH -Diagnosed in 2016. Treated with right lumpectomy and radiation. She declined antihormonal therapy. Currently on surveillance -From a breast cancer standpoint, She is clinically doing well. Lab reviewed, her CBC and  CMP are within normal limits except WBC 11, Hg 9.4, plt 417K, ANC 7.9. Her physical exam and her 02/2020 mammogram were unremarkable. There is no clinical concern for recurrence. -Continue surveillance. Next Mammogram in 02/2021  2. Anemia -She has had anemia intromittently in the last 2 years (since 2020). Pt denies overt bleeding.  -She has colonoscopy and endoscopy on 07/15/20 with Dr Michail Sermon.Per pt results were benign.  -She is on Oral iron once daily.  -Hg continues to decline, Hg 9.4 today (07/18/20). Iron panel still pending  -She has been more fatigued lately. I discussed option of IV Iron for more direct correction. I reviewed possible side effects with her. If her iron level is low today, will start weekly IV Venofer next week for 2-5 weeks. She is agreeable.  -Will monitor with monthly labs. F/u in 3 months   3. Depression -Currently on Zoloft, bupropion, and diazepam. -stable   4. Vitamin D Deficiency  -Currently on Vitamin D 1000 IU daily -will monitor closely   5. Dyspnea  -She is currently being seen by cardiologist for newly diagnosed heart condition and plans to be seen by pulmonologist for recent SOB.    Plan  -Copy note ot Dr Michail Sermon  -Lab monthly X3 -F/u in 3 months  -Mammogram in 02/2021 -will give venofer if her iron level is low    No problem-specific Assessment & Plan notes found for this encounter.   No orders of the defined types were placed in this encounter.  All questions were answered. The patient knows to call the clinic with any problems, questions or concerns. No barriers to learning was detected. The total time spent in the appointment was 30 minutes.     Truitt Merle, MD 07/18/2020   I, Joslyn Devon, am acting as scribe for Truitt Merle, MD.   I have reviewed the above documentation for accuracy and completeness, and I agree with the above.

## 2020-07-18 ENCOUNTER — Inpatient Hospital Stay: Payer: Medicare Other

## 2020-07-18 ENCOUNTER — Other Ambulatory Visit: Payer: Self-pay

## 2020-07-18 ENCOUNTER — Inpatient Hospital Stay: Payer: Medicare Other | Attending: Nurse Practitioner | Admitting: Hematology

## 2020-07-18 VITALS — BP 152/75 | HR 90 | Temp 97.9°F | Resp 18 | Ht 65.5 in | Wt 183.4 lb

## 2020-07-18 DIAGNOSIS — D5 Iron deficiency 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 (chronic): Secondary | ICD-10-CM | POA: Diagnosis not present

## 2020-07-18 DIAGNOSIS — Z79899 Other long term (current) drug therapy: Secondary | ICD-10-CM | POA: Diagnosis not present

## 2020-07-18 DIAGNOSIS — Z170 Estrogen receptor positive status [ER+]: Secondary | ICD-10-CM

## 2020-07-18 DIAGNOSIS — C50411 Malignant neoplasm of upper-outer quadrant of right female breast: Secondary | ICD-10-CM

## 2020-07-18 DIAGNOSIS — Z853 Personal history of malignant neoplasm of breast: Secondary | ICD-10-CM | POA: Insufficient documentation

## 2020-07-18 DIAGNOSIS — I25119 Atherosclerotic heart disease of native coronary artery with unspecified angina pectoris: Secondary | ICD-10-CM | POA: Diagnosis not present

## 2020-07-18 DIAGNOSIS — D509 Iron deficiency anemia, unspecified: Secondary | ICD-10-CM | POA: Diagnosis present

## 2020-07-18 DIAGNOSIS — R059 Cough, unspecified: Secondary | ICD-10-CM | POA: Diagnosis not present

## 2020-07-18 LAB — COMPREHENSIVE METABOLIC PANEL
ALT: 13 U/L (ref 0–44)
AST: 12 U/L — ABNORMAL LOW (ref 15–41)
Albumin: 3.6 g/dL (ref 3.5–5.0)
Alkaline Phosphatase: 89 U/L (ref 38–126)
Anion gap: 7 (ref 5–15)
BUN: 13 mg/dL (ref 8–23)
CO2: 27 mmol/L (ref 22–32)
Calcium: 9.6 mg/dL (ref 8.9–10.3)
Chloride: 107 mmol/L (ref 98–111)
Creatinine, Ser: 0.74 mg/dL (ref 0.44–1.00)
GFR, Estimated: >60 mL/min (ref >60)
Glucose, Bld: 100 mg/dL — ABNORMAL HIGH (ref 70–99)
Potassium: 4.1 mmol/L (ref 3.5–5.1)
Sodium: 141 mmol/L (ref 135–145)
Total Bilirubin: 0.4 mg/dL (ref 0.3–1.2)
Total Protein: 7.6 g/dL (ref 6.5–8.1)

## 2020-07-18 LAB — CBC WITH DIFFERENTIAL/PLATELET
Abs Immature Granulocytes: 0.06 10*3/uL (ref 0.00–0.07)
Basophils Absolute: 0.1 10*3/uL (ref 0.0–0.1)
Basophils Relative: 1 %
Eosinophils Absolute: 0.5 10*3/uL (ref 0.0–0.5)
Eosinophils Relative: 4 %
HCT: 32.3 % — ABNORMAL LOW (ref 36.0–46.0)
Hemoglobin: 9.4 g/dL — ABNORMAL LOW (ref 12.0–15.0)
Immature Granulocytes: 1 %
Lymphocytes Relative: 17 %
Lymphs Abs: 1.8 10*3/uL (ref 0.7–4.0)
MCH: 23 pg — ABNORMAL LOW (ref 26.0–34.0)
MCHC: 29.1 g/dL — ABNORMAL LOW (ref 30.0–36.0)
MCV: 79 fL — ABNORMAL LOW (ref 80.0–100.0)
Monocytes Absolute: 0.6 10*3/uL (ref 0.1–1.0)
Monocytes Relative: 6 %
Neutro Abs: 7.9 10*3/uL — ABNORMAL HIGH (ref 1.7–7.7)
Neutrophils Relative %: 71 %
Platelets: 417 10*3/uL — ABNORMAL HIGH (ref 150–400)
RBC: 4.09 MIL/uL (ref 3.87–5.11)
RDW: 18.3 % — ABNORMAL HIGH (ref 11.5–15.5)
WBC: 11 10*3/uL — ABNORMAL HIGH (ref 4.0–10.5)
nRBC: 0 % (ref 0.0–0.2)

## 2020-07-18 LAB — IRON AND TIBC
Iron: 111 ug/dL (ref 41–142)
Saturation Ratios: 25 % (ref 21–57)
TIBC: 445 ug/dL — ABNORMAL HIGH (ref 236–444)
UIBC: 334 ug/dL (ref 120–384)

## 2020-07-18 LAB — FERRITIN: Ferritin: 11 ng/mL (ref 11–307)

## 2020-07-19 ENCOUNTER — Encounter: Payer: Self-pay | Admitting: Hematology

## 2020-07-19 DIAGNOSIS — D5 Iron deficiency 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 (chronic): Secondary | ICD-10-CM | POA: Insufficient documentation

## 2020-07-20 DIAGNOSIS — K621 Rectal polyp: Secondary | ICD-10-CM | POA: Diagnosis not present

## 2020-07-20 DIAGNOSIS — K2930 Chronic superficial gastritis without bleeding: Secondary | ICD-10-CM | POA: Diagnosis not present

## 2020-07-22 ENCOUNTER — Encounter: Payer: Self-pay | Admitting: Internal Medicine

## 2020-07-22 ENCOUNTER — Telehealth (INDEPENDENT_AMBULATORY_CARE_PROVIDER_SITE_OTHER): Payer: Medicare Other | Admitting: Internal Medicine

## 2020-07-22 VITALS — Ht 66.0 in | Wt 182.0 lb

## 2020-07-22 DIAGNOSIS — I25119 Atherosclerotic heart disease of native coronary artery with unspecified angina pectoris: Secondary | ICD-10-CM

## 2020-07-22 DIAGNOSIS — I10 Essential (primary) hypertension: Secondary | ICD-10-CM

## 2020-07-22 DIAGNOSIS — Z79899 Other long term (current) drug therapy: Secondary | ICD-10-CM

## 2020-07-22 DIAGNOSIS — R0609 Other forms of dyspnea: Secondary | ICD-10-CM

## 2020-07-22 DIAGNOSIS — D5 Iron deficiency 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 (chronic): Secondary | ICD-10-CM

## 2020-07-22 DIAGNOSIS — R0600 Dyspnea, unspecified: Secondary | ICD-10-CM

## 2020-07-22 DIAGNOSIS — G4733 Obstructive sleep apnea (adult) (pediatric): Secondary | ICD-10-CM

## 2020-07-22 MED ORDER — ATORVASTATIN CALCIUM 40 MG PO TABS: 40.0000 mg | ORAL_TABLET | Freq: Every day | ORAL | 3 refills | Status: DC

## 2020-07-22 MED ORDER — ASPIRIN EC 81 MG PO TBEC: 81.0000 mg | DELAYED_RELEASE_TABLET | Freq: Every day | ORAL | 3 refills | Status: DC

## 2020-07-22 NOTE — Telephone Encounter (Signed)
Patient had Virtual Visit with Dr. Margaretann Loveless today 12/10.  Results reviewed with patient during virtual visit.   Aspirin 81 mg added to patients medication list, patient currently taking.   Atorvastatin 40mg  added to patients medication list, patient currently taking.   Patient will follow up with Dr. Margaretann Loveless in 3 months and will call if any issues sooner.   Patient verbalized understanding of all after visit instructions.

## 2020-07-22 NOTE — Progress Notes (Signed)
Virtual Visit via Telephone Note   This visit type was conducted due to national recommendations for restrictions regarding the COVID-19 Pandemic (e.g. social distancing) in an effort to limit this patient's exposure and mitigate transmission in our community.  Due to her co-morbid illnesses, this patient is at least at moderate risk for complications without adequate follow up.  This format is felt to be most appropriate for this patient at this time.  The patient did not have access to video technology/had technical difficulties with video requiring transitioning to audio format only (telephone).  All issues noted in this document were discussed and addressed.  No physical exam could be performed with this format.  Please refer to the patient's chart for her  consent to telehealth for [facility].    Date:  07/22/2020   ID:  Jessica Roberts, DOB Nov 15, 1945, MRN [id number] The patient was identified using 2 identifiers.  Patient Location: Home Provider Location: Home Office  PCP:  Cari Caraway, MD  Cardiologist:  No primary care provider on file.  Electrophysiologist:  None   Evaluation Performed:  Follow-Up Visit  Chief Complaint:  Follow up dyspnea  History of Present Illness:    Jessica Roberts is a 74 y.o. female with right breast cancer with radiation and surgery, large hiatal hernia with significant GERD, iron deficiency anemia. Presents for follow up.    We discussed the results of testing including a hemodynamically significant lesion in the OM1, and otherwise mild coronary artery disease.  CT also documents a large hiatal hernia and aortic atherosclerosis.  Echocardiogram shows overall benign findings with mild LVH and grade 1 diastolic dysfunction.  She has no chest pain.  We discussed that for now medical management of CAD is indicated for secondary prevention.  I have asked her to start an aspirin 81 mg daily and atorvastatin 40 mg daily.  She denies  melena or any bleeding concerns.  She has been noted to be chronically anemic and iron is low with a ferritin of 11.  She is to start IV iron infusions soon.  Followed by hematology oncology.  We discussed today that her dyspnea is likely secondary to iron deficiency anemia and may be contributed to by hiatal hernia with GERD that may be causing pulmonary symptoms.  She has 1 obstructive lesion in her coronary arteries, and this is unlikely to be the sole source of her significant daily dyspnea.  The patient does have symptoms concerning for COVID-19 infection (fever, chills, cough, or new shortness of breath).  She is currently receiving antibiotics and this is being followed by PCP.   Past Medical History:  Diagnosis Date  . Allergy   . Anxiety   . Anxiety and depression   . Breast cancer ([place]) 04/07/15   right breast  . Breast cancer of upper-outer quadrant of right female breast ([place]) 04/11/2015  . Broken nose   . Complication of anesthesia    "had a very hard time waking up"  . Dengue fever   . Depression   . Fall    broken nose  . GERD (gastroesophageal reflux disease)   . Head injury 1995   concussion in [place], med evac to [place], ear damage with vertigo  . Head injury, acute, with loss of consciousness ([place]) 2000   [place]  . Head injury, closed, with concussion 1986   [place]  . Headache   . Hypertension   . Malaria   . PONV (postoperative nausea  and vomiting)   . Typhoid    Past Surgical History:  Procedure Laterality Date  . BREAST LUMPECTOMY Right 04/27/2015  . BREAST LUMPECTOMY WITH NEEDLE LOCALIZATION AND AXILLARY SENTINEL LYMPH NODE BX Right 04/27/2015   Procedure: RIGHT BREAST LUMPECTOMY WITH  TWO NEEDLE LOCALIZATION AND TWO RIGHT AXILLARY SENTINEL LYMPH NODE BX;  Surgeon: Fanny Skates, MD;  Location: [facility];  Service: General;  Laterality: Right;  . COLONOSCOPY W/ BIOPSIES AND POLYPECTOMY    . NERVE SURGERY     '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'  during surgery for endometriosis  . surgical procedure for endometriosis    . TONSILLECTOMY       Current Meds  Medication Sig  . acetaminophen (TYLENOL) 500 MG tablet Take 500 mg by mouth 2 (two) times daily as needed for moderate pain (pain). Reported on 10/12/2015  . albuterol (PROVENTIL HFA;VENTOLIN HFA) 108 (90 Base) MCG/ACT inhaler Inhale 1 puff into the lungs every 6 (six) hours as needed for wheezing or shortness of breath.  Jearl Klinefelter ELLIPTA 62.5-25 MCG/INH AEPB Inhale 1 puff into the lungs daily.  . ARIPiprazole (ABILIFY) 2 MG tablet Take 2 mg by mouth daily.  Marland Kitchen atorvastatin (LIPITOR) 40 MG tablet Take 0.5 tablets (20 mg total) by mouth daily.  . Esomeprazole Magnesium (NEXIUM PO) Take 22.3 mg by mouth daily.   . metroNIDAZOLE (METROGEL) 0.75 % gel metronidazole 0.75 % topical gel  . sertraline (ZOLOFT) 100 MG tablet Take 100 mg by mouth daily.      Allergies:   Codeine, Oxycontin [oxycodone hcl], Percocet [oxycodone-acetaminophen], and Percodan [oxycodone-aspirin]   Social History   Tobacco Use  . Smoking status: Former Smoker    Years: 30.00    Types: Cigarettes    Quit date: 01/11/1994    Years since quitting: 26.5  . Smokeless tobacco: Never Used  Substance Use Topics  . Alcohol use: Yes    Alcohol/week: 4.0 standard drinks    Types: 4 Glasses of wine per week    Comment: moderate   . Drug use: No     Family Hx: The patient's family history includes Alcohol abuse in her paternal grandfather; Breast cancer in her mother; Diabetes in her maternal grandmother; Emphysema in her father; Heart disease in her paternal grandmother; Hypertension in her mother; Thyroid disease in her mother.  ROS:   Please see the history of present illness.     All other systems reviewed and are negative.   Prior CV studies:   The following studies were reviewed today:  CCTA, echo, laboratory studies from 07/18/2020  Labs/Other Tests and Data Reviewed:    EKG:  No ECG  reviewed.  Recent Labs: 07/18/2020: ALT 13; BUN 13; Creatinine, Ser 0.74; Hemoglobin 9.4; Platelets 417; Potassium 4.1; Sodium 141   Recent Lipid Panel No results found for: CHOL, TRIG, HDL, CHOLHDL, LDLCALC, LDLDIRECT  Wt Readings from Last 3 Encounters:  07/22/20 182 lb (82.6 kg)  07/18/20 183 lb 6.4 oz (83.2 kg)  06/06/20 182 lb 6.4 oz (82.7 kg)     Risk Assessment/Calculations:      Objective:    Vital Signs:  Ht 5\' 6"  (1.676 m)   Wt 182 lb (82.6 kg)   BMI 29.38 kg/m    VITAL SIGNS:  reviewed GEN:  no acute distress RESPIRATORY:  normal respiratory effort, no increased work of breathing NEURO:  alert and oriented x 3, speech normal PSYCH:  normal affect   ASSESSMENT & PLAN:    1. Coronary artery disease  involving native heart with angina pectoris, unspecified vessel or lesion type ([place])   2. Medication management   3. DOE (dyspnea on exertion)   4. Essential hypertension   5. Iron deficiency 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 (chronic)   6. OSA (obstructive sleep apnea)    She has multifactorial dyspnea.  She has iron deficiency anemia and needs IV iron per hematology.  This may be the source of her shortness of breath.  We will reassess shortness of breath after IV iron has been initiated for symptom improvement.  For secondary prevention of CAD we will start aspirin 81 mg daily and atorvastatin 40 mg daily.  Counseled on statin side effects.  The degree of her coronary artery disease is unlikely to be the sole cause of her dyspnea on exertion, however we can revisit if all other factors have been addressed.  I described to her that if she experiences chest pain we can consider coronary angiography, however with anemia currently this should be addressed first.  BNP was drawn and is normal.  Would recommend treatment for obstructive sleep apnea if patient tolerates.  We discussed in detail diet lifestyle modifications for optimal cardiovascular health.  She inquires about  cognitive issues with people who have CAD and we discussed this in detail to the best of my knowledge.  I will follow up with her in the office in 3 months to reevaluate her symptoms, or sooner if needed.   COVID-19 Education: The signs and symptoms of COVID-19 were discussed with the patient and how to seek care for testing (follow up with PCP or arrange E-visit).  The importance of social distancing was discussed today.  Time:   Today, I have spent 27:10 minutes with the patient with telehealth technology discussing the above problems.  >30 minutes total encounter time reviewing the record.   Medication Adjustments/Labs and Tests Ordered: Current medicines are reviewed at length with the patient today.  Concerns regarding medicines are outlined above.   Patient Instructions  Medication Instructions:  START: ASPIRIN  START: ATORVASTATIN (LIPITOR) 40mg  DAILY  *If you need a refill on your cardiac medications before your next appointment, please call your pharmacy*  Follow-Up: At [facility], you and your health needs are our priority.  As part of our continuing mission to provide you with exceptional heart care, we have created designated Provider Care Teams.  These Care Teams include your primary Cardiologist (physician) and Advanced Practice Providers (APPs -  Physician Assistants and Nurse Practitioners) who all work together to provide you with the care you need, when you need it.  Your next appointment:   3 month(s)  The format for your next appointment:   In Person  Provider:   Cherlynn Kaiser, MD    Signed, Elouise Munroe, MD  07/22/2020 8:04 AM    [place]

## 2020-07-22 NOTE — Patient Instructions (Signed)
Medication Instructions:  START: ASPIRIN  START: ATORVASTATIN (LIPITOR) 40mg  DAILY  *If you need a refill on your cardiac medications before your next appointment, please call your pharmacy*  Follow-Up: At [facility], you and your health needs are our priority.  As part of our continuing mission to provide you with exceptional heart care, we have created designated Provider Care Teams.  These Care Teams include your primary Cardiologist (physician) and Advanced Practice Providers (APPs -  Physician Assistants and Nurse Practitioners) who all work together to provide you with the care you need, when you need it.  Your next appointment:   3 month(s)  The format for your next appointment:   In Person  Provider:   Cherlynn Kaiser, MD

## 2020-07-26 ENCOUNTER — Other Ambulatory Visit: Payer: Self-pay

## 2020-07-26 ENCOUNTER — Inpatient Hospital Stay: Payer: Medicare Other

## 2020-07-26 ENCOUNTER — Telehealth: Payer: Self-pay | Admitting: Hematology

## 2020-07-26 VITALS — BP 116/60 | HR 66 | Temp 98.6°F | Resp 18

## 2020-07-26 DIAGNOSIS — Z853 Personal history of malignant neoplasm of breast: Secondary | ICD-10-CM | POA: Diagnosis not present

## 2020-07-26 DIAGNOSIS — D5 Iron deficiency 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 (chronic): Secondary | ICD-10-CM

## 2020-07-26 DIAGNOSIS — D509 Iron deficiency anemia, unspecified: Secondary | ICD-10-CM | POA: Diagnosis not present

## 2020-07-26 MED ORDER — SODIUM CHLORIDE 0.9 % IV SOLN
Freq: Once | INTRAVENOUS | Status: AC
  Filled 2020-07-26: qty 250

## 2020-07-26 MED ORDER — DIPHENHYDRAMINE HCL 25 MG PO CAPS
25.0000 mg | ORAL_CAPSULE | Freq: Once | ORAL | Status: AC
  Administered 2020-07-26: 25 mg via ORAL

## 2020-07-26 MED ORDER — DIPHENHYDRAMINE HCL 25 MG PO CAPS
ORAL_CAPSULE | ORAL | Status: AC
  Filled 2020-07-26: qty 1

## 2020-07-26 MED ORDER — SODIUM CHLORIDE 0.9 % IV SOLN
400.0000 mg | Freq: Once | INTRAVENOUS | Status: AC
  Administered 2020-07-26: 400 mg via INTRAVENOUS
  Filled 2020-07-26: qty 20

## 2020-07-26 NOTE — Patient Instructions (Signed)

## 2020-07-26 NOTE — Telephone Encounter (Signed)
Called pt per 12/7 sch msg - pt received first dose of iron on 12/14 . Called pt to set up the next two - no answer. Left message for patient to call back to schedule.

## 2020-07-26 NOTE — Progress Notes (Signed)
Pt discharged in no apparent distress. Pt left ambulatory without assistance. Pt aware of discharge instructions and verbalized understanding and had no further questions.  

## 2020-08-09 ENCOUNTER — Inpatient Hospital Stay: Payer: Medicare Other

## 2020-08-09 ENCOUNTER — Other Ambulatory Visit: Payer: Self-pay

## 2020-08-09 VITALS — BP 139/62 | HR 69 | Resp 17

## 2020-08-09 DIAGNOSIS — Z853 Personal history of malignant neoplasm of breast: Secondary | ICD-10-CM | POA: Diagnosis not present

## 2020-08-09 DIAGNOSIS — D509 Iron deficiency anemia, unspecified: Secondary | ICD-10-CM | POA: Diagnosis not present

## 2020-08-09 DIAGNOSIS — D5 Iron deficiency 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 (chronic): Secondary | ICD-10-CM

## 2020-08-09 MED ORDER — DIPHENHYDRAMINE HCL 25 MG PO CAPS
ORAL_CAPSULE | ORAL | Status: AC
  Filled 2020-08-09: qty 1

## 2020-08-09 MED ORDER — DIPHENHYDRAMINE HCL 25 MG PO CAPS
25.0000 mg | ORAL_CAPSULE | Freq: Once | ORAL | Status: AC
Start: 2020-08-09 — End: 2020-08-09
  Administered 2020-08-09: 25 mg via ORAL

## 2020-08-09 MED ORDER — SODIUM CHLORIDE 0.9 % IV SOLN
400.0000 mg | Freq: Once | INTRAVENOUS | Status: AC
  Administered 2020-08-09: 400 mg via INTRAVENOUS
  Filled 2020-08-09: qty 20

## 2020-08-09 MED ORDER — SODIUM CHLORIDE 0.9 % IV SOLN
Freq: Once | INTRAVENOUS | Status: AC
  Filled 2020-08-09: qty 250

## 2020-08-09 NOTE — Progress Notes (Signed)
Pt discharged in no apparent distress. Pt left ambulatory without assistance. Pt aware of discharge instructions and verbalized understanding and had no further questions.  

## 2020-08-09 NOTE — Patient Instructions (Signed)

## 2020-08-15 ENCOUNTER — Other Ambulatory Visit: Payer: Self-pay | Admitting: Hematology

## 2020-08-15 ENCOUNTER — Other Ambulatory Visit: Payer: Self-pay

## 2020-08-15 ENCOUNTER — Inpatient Hospital Stay: Payer: Medicare Other | Attending: Nurse Practitioner

## 2020-08-15 VITALS — BP 145/62 | HR 69 | Temp 98.8°F | Resp 18

## 2020-08-15 DIAGNOSIS — Z170 Estrogen receptor positive status [ER+]: Secondary | ICD-10-CM

## 2020-08-15 DIAGNOSIS — D509 Iron deficiency anemia, unspecified: Secondary | ICD-10-CM | POA: Insufficient documentation

## 2020-08-15 DIAGNOSIS — Z853 Personal history of malignant neoplasm of breast: Secondary | ICD-10-CM | POA: Insufficient documentation

## 2020-08-15 DIAGNOSIS — C50411 Malignant neoplasm of upper-outer quadrant of right female breast: Secondary | ICD-10-CM

## 2020-08-15 DIAGNOSIS — D5 Iron deficiency 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 (chronic): Secondary | ICD-10-CM

## 2020-08-15 MED ORDER — DIPHENHYDRAMINE HCL 25 MG PO CAPS
25.0000 mg | ORAL_CAPSULE | Freq: Once | ORAL | Status: AC
  Administered 2020-08-15: 25 mg via ORAL

## 2020-08-15 MED ORDER — DIPHENHYDRAMINE HCL 25 MG PO CAPS
ORAL_CAPSULE | ORAL | Status: AC
  Filled 2020-08-15: qty 1

## 2020-08-15 MED ORDER — SODIUM CHLORIDE 0.9 % IV SOLN
400.0000 mg | Freq: Once | INTRAVENOUS | Status: AC
  Administered 2020-08-15: 400 mg via INTRAVENOUS
  Filled 2020-08-15: qty 20

## 2020-08-15 NOTE — Progress Notes (Signed)
pt refused to stay x 30 minutes for observation  VSS.

## 2020-08-16 ENCOUNTER — Inpatient Hospital Stay: Payer: Medicare Other

## 2020-08-16 ENCOUNTER — Other Ambulatory Visit: Payer: Self-pay

## 2020-08-16 DIAGNOSIS — I878 Other specified disorders of veins: Secondary | ICD-10-CM

## 2020-08-16 DIAGNOSIS — D5 Iron deficiency 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 (chronic): Secondary | ICD-10-CM

## 2020-08-16 DIAGNOSIS — Z170 Estrogen receptor positive status [ER+]: Secondary | ICD-10-CM

## 2020-08-16 DIAGNOSIS — C50411 Malignant neoplasm of upper-outer quadrant of right female breast: Secondary | ICD-10-CM

## 2020-08-19 ENCOUNTER — Other Ambulatory Visit: Payer: Self-pay

## 2020-08-19 DIAGNOSIS — D5 Iron deficiency 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 (chronic): Secondary | ICD-10-CM

## 2020-08-19 DIAGNOSIS — I878 Other specified disorders of veins: Secondary | ICD-10-CM

## 2020-08-23 ENCOUNTER — Other Ambulatory Visit: Payer: Self-pay

## 2020-08-23 ENCOUNTER — Inpatient Hospital Stay: Payer: Medicare Other

## 2020-08-23 DIAGNOSIS — Z853 Personal history of malignant neoplasm of breast: Secondary | ICD-10-CM | POA: Diagnosis not present

## 2020-08-23 DIAGNOSIS — C50411 Malignant neoplasm of upper-outer quadrant of right female breast: Secondary | ICD-10-CM

## 2020-08-23 DIAGNOSIS — D5 Iron deficiency 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 (chronic): Secondary | ICD-10-CM

## 2020-08-23 LAB — IRON AND TIBC
Iron: 85 ug/dL (ref 41–142)
Saturation Ratios: 27 % (ref 21–57)
TIBC: 319 ug/dL (ref 236–444)
UIBC: 234 ug/dL (ref 120–384)

## 2020-08-23 LAB — COMPREHENSIVE METABOLIC PANEL WITH GFR
ALT: 14 U/L (ref 0–44)
AST: 12 U/L — ABNORMAL LOW (ref 15–41)
Albumin: 3.7 g/dL (ref 3.5–5.0)
Alkaline Phosphatase: 89 U/L (ref 38–126)
Anion gap: 6 (ref 5–15)
BUN: 11 mg/dL (ref 8–23)
CO2: 27 mmol/L (ref 22–32)
Calcium: 9.4 mg/dL (ref 8.9–10.3)
Chloride: 108 mmol/L (ref 98–111)
Creatinine, Ser: 0.77 mg/dL (ref 0.44–1.00)
GFR, Estimated: >60 mL/min
Glucose, Bld: 113 mg/dL — ABNORMAL HIGH (ref 70–99)
Potassium: 4.6 mmol/L (ref 3.5–5.1)
Sodium: 141 mmol/L (ref 135–145)
Total Bilirubin: 0.3 mg/dL (ref 0.3–1.2)
Total Protein: 7.5 g/dL (ref 6.5–8.1)

## 2020-08-23 LAB — CBC WITH DIFFERENTIAL/PLATELET
Abs Immature Granulocytes: 0.03 10*3/uL (ref 0.00–0.07)
Basophils Absolute: 0.1 10*3/uL (ref 0.0–0.1)
Basophils Relative: 1 %
Eosinophils Absolute: 0.3 10*3/uL (ref 0.0–0.5)
Eosinophils Relative: 3 %
HCT: 39 % (ref 36.0–46.0)
Hemoglobin: 11.9 g/dL — ABNORMAL LOW (ref 12.0–15.0)
Immature Granulocytes: 0 %
Lymphocytes Relative: 21 %
Lymphs Abs: 1.7 10*3/uL (ref 0.7–4.0)
MCH: 26.4 pg (ref 26.0–34.0)
MCHC: 30.5 g/dL (ref 30.0–36.0)
MCV: 86.7 fL (ref 80.0–100.0)
Monocytes Absolute: 0.6 10*3/uL (ref 0.1–1.0)
Monocytes Relative: 8 %
Neutro Abs: 5.5 10*3/uL (ref 1.7–7.7)
Neutrophils Relative %: 67 %
Platelets: 318 10*3/uL (ref 150–400)
RBC: 4.5 MIL/uL (ref 3.87–5.11)
RDW: 21.2 % — ABNORMAL HIGH (ref 11.5–15.5)
WBC: 8.2 10*3/uL (ref 4.0–10.5)
nRBC: 0 % (ref 0.0–0.2)

## 2020-08-23 LAB — FERRITIN: Ferritin: 185 ng/mL (ref 11–307)

## 2020-08-25 ENCOUNTER — Ambulatory Visit (INDEPENDENT_AMBULATORY_CARE_PROVIDER_SITE_OTHER): Payer: Medicare Other | Admitting: Internal Medicine

## 2020-08-25 ENCOUNTER — Encounter: Payer: Self-pay | Admitting: Internal Medicine

## 2020-08-25 ENCOUNTER — Other Ambulatory Visit: Payer: Self-pay

## 2020-08-25 VITALS — BP 124/82 | HR 67 | Temp 97.2°F | Ht 66.0 in | Wt 181.6 lb

## 2020-08-25 DIAGNOSIS — J410 Simple chronic bronchitis: Secondary | ICD-10-CM | POA: Diagnosis not present

## 2020-08-25 DIAGNOSIS — Z87891 Personal history of nicotine dependence: Secondary | ICD-10-CM | POA: Diagnosis not present

## 2020-08-25 NOTE — Progress Notes (Signed)
Jessica Roberts    [id number]    08-Nov-1945  Primary Care Physician:McNeill, Abigail Butts, MD  Referring Physician: Cari Caraway, MD [place],  [place] [id number] Reason for Consultation: shortness of breath, COPD Date of Consultation: 08/25/2020  Chief complaint:   Chief Complaint  Patient presents with  . Consult    Referred by PCP for possible COPD. Patient has noticed an increase in cough and DOE over the past 6 months.      HPI: Jessica Roberts is a 75 y.o. woman with previous tobacco use who presents for new patient evaluation for possibel COPD. She notes over a year of progressive cough and dyspnea on exertion. She attributed this to her weight. When she was on vacation with friends last fall she felt she couldn't keep up with her friends.   She had PFTs done in 2021 which showed normal spirometry and lung volumes. She is on anoro and prn albuterol which were started by her primary care doctor. She feels much better with anoro daily. She uses albuterol less than once/week.   Dyspnea is with exertion, carrying groceries into the house. She does also have cough, chest tightness and wheezing. Cough is productive and worse in the morning.  She has never been hospitalized for her breathing. She gets bronchitis at least once a year, every winter.    No childhood respiratory disease. She does have reflux and takes nexium although it has been bothering her less this past month.   Social history:  Occupation: she lived and worked in [place] for 20 years, [place] warn torn [place].  Exposures: no indoor wood burning stoves/fireplaces. Lives at home independently  Smoking history: passive smoke exposure in childhood  Social History   Occupational History    Employer: [employer]    Comment: [employer]  Tobacco Use  . Smoking status: Former Smoker    Years: 30.00    Types: Cigarettes    Quit date: 01/11/1994    Years since quitting: 26.6  . Smokeless  tobacco: Never Used  Substance and Sexual Activity  . Alcohol use: Yes    Alcohol/week: 4.0 standard drinks    Types: 4 Glasses of wine per week    Comment: moderate   . Drug use: No  . Sexual activity: Never    Birth control/protection: None    Relevant family history: Family History  Problem Relation Age of Onset  . Hypertension Mother   . Thyroid disease Mother   . Breast cancer Mother   . Emphysema Father   . Diabetes Maternal Grandmother   . Heart disease Paternal Grandmother   . Alcohol abuse Paternal Grandfather     Past Medical History:  Diagnosis Date  . Allergy   . Anxiety   . Anxiety and depression   . Breast cancer ([place]) 04/07/15   right breast  . Breast cancer of upper-outer quadrant of right female breast ([place]) 04/11/2015  . Broken nose   . Complication of anesthesia    "had a very hard time waking up"  . Dengue fever   . Depression   . Fall    broken nose  . GERD (gastroesophageal reflux disease)   . Head injury 1995   concussion in [place], med evac to [place], ear damage with vertigo  . Head injury, acute, with loss of consciousness ([place]) 2000   [place] of the country  . Head [place] closed[place] concussion [place]  .  Headache   . Hypertension   . Malaria   . PONV (postoperative nausea and vomiting)   . Typhoid     Past Surgical History:  Procedure Laterality Date  . BREAST LUMPECTOMY Right 04/27/2015  . BREAST LUMPECTOMY WITH NEEDLE LOCALIZATION AND AXILLARY SENTINEL LYMPH NODE BX Right 04/27/2015   Procedure: RIGHT BREAST LUMPECTOMY WITH  TWO NEEDLE LOCALIZATION AND TWO RIGHT AXILLARY SENTINEL LYMPH NODE BX;  Surgeon: Fanny Skates, MD;  Location: [place];  Service: General;  Laterality: Right;  . COLONOSCOPY W/ BIOPSIES AND POLYPECTOMY    . NERVE SURGERY     '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' during surgery for endometriosis  . surgical procedure for endometriosis    . TONSILLECTOMY       Physical Exam: Blood pressure 124/82, pulse  67, temperature (!) 97.2 F (36.2 C), temperature source Temporal, height 5\' 6"  (1.676 m), weight 181 lb 9.6 oz (82.4 kg), SpO2 98 %. Gen:      No acute distress ENT:  no nasal polyps, mucus membranes moist Lungs:    No increased respiratory effort, symmetric chest wall excursion, clear to auscultation bilaterally, no wheezes or crackles CV:         Regular rate and rhythm; no murmurs, rubs, or gallops.  No pedal edema Abd:      + bowel sounds; soft, non-tender; no distension MSK: no acute synovitis of DIP or PIP joints, no mechanics hands.  Skin:      Warm and dry; no rashes Neuro: normal speech, no focal facial asymmetry Psych: alert and oriented x3, normal mood and affect Ext: no digital clubbing  Data Reviewed/Medical Decision Making:  Independent interpretation of tests: Imaging: . Review of patient's Jan 2022 CT coronary images in lung windows revealed no acute process. The patient's images have been independently reviewed by me.    PFTs: I have personally reviewed the patient's PFTs and spirometry and lung volumes are normal. There is a moderately reduced diffusion capacity.  PFT Results Latest Ref Rng & Units 06/02/2020  FVC-Pre L 2.35  FVC-Predicted Pre % 75  FVC-Post L 2.27  FVC-Predicted Post % 73  Pre FEV1/FVC % % 81  Post FEV1/FCV % % 83  FEV1-Pre L 1.91  FEV1-Predicted Pre % 81  FEV1-Post L 1.87  DLCO uncorrected ml/min/mmHg 9.45  DLCO UNC% % 46  DLVA Predicted % 62  TLC L 4.38  TLC % Predicted % 82  RV % Predicted % 85    Labs:  Lab Results  Component Value Date   WBC 8.2 08/23/2020   HGB 11.9 (L) 08/23/2020   HCT 39.0 08/23/2020   MCV 86.7 08/23/2020   PLT 318 08/23/2020   Lab Results  Component Value Date   NA 141 08/23/2020   K 4.6 08/23/2020   CL 108 08/23/2020   CO2 27 08/23/2020     Immunization status:  Immunization History  Administered Date(s) Administered  . Influenza,inj,Quad PF,6+ Mos 05/20/2015  . Tdap 06/26/2013    . I  reviewed prior external note(s) from PCP . I reviewed the result(s) of the labs and imaging as noted above.     Assessment:  COPD - new diagnosis  Plan/Recommendations: Continue anoro and prn albuterol.  She is outside the window for LDCT for lung cancer screening.  Continue to stay active with walking and regular exercise.   We discussed disease management and progression at length today for COPD  I spent 46 minutes in the care of this patient today including  pre-charting, chart review, review of results, face-to-face care, coordination of care and communication with consultants etc.).  Return to Care: Return in about 6 months (around 02/22/2021).  Lenice Llamas, MD Pulmonary and [place]  CC: Cari Caraway, MD

## 2020-08-25 NOTE — Patient Instructions (Signed)
The patient should have follow up scheduled with myself in 6 months.   Understanding COPD   What is COPD? COPD stands for chronic obstructive pulmonary (lung) disease. COPD is a general term used for several lung diseases.  COPD is an umbrella term and encompasses other  common diseases in this group like chronic bronchitis and emphysema. Chronic asthma may also be included in this group. While some patients with COPD have only chronic bronchitis or emphysema, most patients have a combination of both.  You might hear these terms used in exchange for one another.   COPD adds to the work of the heart. Diseased lungs may reduce the amount of oxygen that goes to the blood. High blood pressure in blood vessels from the heart to the lungs makes it difficult for the heart to pump. Lung disease can also cause the body to produce too many red blood cells which may make the blood thicker and harder to pump.   Patients who have COPD with low oxygen levels may develop an enlarged heart (cor pulmonale). This condition weakens the heart and causes increased shortness of breath and swelling in the legs and feet.   Chronic bronchitis Chronic bronchitis is irritation and inflammation (swelling) of the lining in the bronchial tubes (air passages). The irritation causes coughing and an excess amount of mucus in the airways. The swelling makes it difficult to get air in and out of the lungs. The small, hair-like structures on the inside of the airways (called cilia) may be damaged by the irritation. The cilia are then unable to help clean mucus from the airways.  Bronchitis is generally considered to be chronic when you have: a productive cough (cough up mucus) and shortness of breath that lasts about 3 months or more each year for 2 or more years in a row. Your doctor may define chronic bronchitis differently.   Emphysema Emphysema is the destruction, or breakdown, of the walls of the alveoli (air sacs) located at the  end of the bronchial tubes. The damaged alveoli are not able to exchange oxygen and carbon dioxide between the lungs and the blood. The bronchioles lose their elasticity and collapse when you exhale, trapping air in the lungs. The trapped air keeps fresh air and oxygen from entering the lungs.   Who is affected by COPD? Emphysema and chronic bronchitis affect approximately 16 million people in the [place], or close to 11 percent of the population.   Symptoms of COPD   Shortness of breath   Shortness of breath with mild exercise (walking, using the stairs, etc.)   Chronic, productive cough (with mucus)   A feeling of "tightness" in the chest   Wheezing   What causes COPD? The two primary causes of COPD are cigarette smoking and alpha1-antitrypsin (AAT) deficiency. Air pollution and occupational dusts may also contribute to COPD, especially when the person exposed to these substances is a cigarette smoker.  Cigarette smoke causes COPD by irritating the airways and creating inflammation that narrows the airways, making it more difficult to breathe. Cigarette smoke also causes the cilia to stop working properly so mucus and trapped particles are not cleaned from the airways. As a result, chronic cough and excess mucus production develop, leading to chronic bronchitis.  In some people, chronic bronchitis and infections can lead to destruction of the small airways, or emphysema.  AAT deficiency, an inherited disorder, can also lead to emphysema. Alpha antitrypsin (AAT) is a protective material produced in the liver   and transported to the lungs to help combat inflammation. When there is not enough of the chemical AAT, the body is no longer protected from an enzyme in the white blood cells.   How is COPD diagnosed?  To diagnose COPD, the physician needs to know: . Do you smoke?  . Have you had chronic exposure to dust or air pollutants?  . Do other members of your family have lung disease?   . Are you short of breath?  . Do you get short of breath with exercise?  . Do you have chronic cough and/or wheezing?  . Do you cough up excess mucus?  To help with the diagnosis, the physician will conduct a thorough physical exam which includes:  1. Listening to your lungs and heart  2. Checking your blood pressure and pulse  3. Examining your nose and throat  4. Checking your feet and ankles for swelling   Laboratory and other tests Several laboratory and other tests are needed to confirm a diagnosis of COPD. These tests may include:  . Chest X-ray to look for lung changes that could be caused by COPD  .  Spirometry and pulmonary function tests (PFTs) to determine lung volume and air flow  . Pulse oximetry to measure the saturation of oxygen in the blood  . Arterial blood gases (ABGs) to determine the amount of oxygen and carbon dioxide in the blood  . Exercise testing to determine if the oxygen level in the blood drops during exercise   Treatment In the beginning stages of COPD, there is minimal shortness of breath that may be noticed only during exercise. As the disease progresses, shortness of breath may worsen and you may need to wear an oxygen device.   To help control other symptoms of COPD, the following treatments and lifestyle changes may be prescribed.  . Quitting smoking  . Avoiding cigarette smoke and other irritants  . Taking medications including: a. bronchodilators b. anti-inflammatory agents c. oxygen d. antibiotics  . Maintaining a healthy diet  . Following a structured exercise program such as pulmonary rehabilitation . Preventing respiratory infections  . Controlling stress   If your COPD progresses, you may be eligible to be evaluated for lung volume reduction surgery or lung transplantation. You may also be eligible to participate in certain clinical trials (research studies). Ask your health care providers about studies being conducted in your hospital.    What is the outlook? Although COPD can not be cured, its symptoms can be treated and your quality of life can be improved. Your prognosis or outlook for the future will depend on how well your lungs are functioning, your symptoms, and how well you respond to and follow your treatment plan.   

## 2020-08-29 ENCOUNTER — Other Ambulatory Visit: Payer: Self-pay | Admitting: Radiology

## 2020-08-29 ENCOUNTER — Telehealth: Payer: Self-pay | Admitting: *Deleted

## 2020-08-29 NOTE — Telephone Encounter (Signed)
Notified of message below.   Pt would like to cancel appt for port for now. Will reschedule if labs start dropping.

## 2020-08-29 NOTE — Telephone Encounter (Signed)
-----   Message from Truitt Merle, MD sent at 08/27/2020 12:27 PM EST ----- Please let pt know her anemia much improved after iv iron, continue monitoring, thanks   Truitt Merle  08/27/2020

## 2020-08-30 ENCOUNTER — Ambulatory Visit (HOSPITAL_COMMUNITY): Payer: Medicare Other

## 2020-08-30 ENCOUNTER — Inpatient Hospital Stay (HOSPITAL_COMMUNITY): Admission: RE | Admit: 2020-08-30 | Payer: Medicare Other | Source: Ambulatory Visit

## 2020-09-20 DIAGNOSIS — R109 Unspecified abdominal pain: Secondary | ICD-10-CM | POA: Diagnosis not present

## 2020-09-20 DIAGNOSIS — N390 Urinary tract infection, site not specified: Secondary | ICD-10-CM | POA: Diagnosis not present

## 2020-09-27 ENCOUNTER — Other Ambulatory Visit: Payer: Self-pay

## 2020-09-27 ENCOUNTER — Inpatient Hospital Stay: Payer: Medicare Other | Attending: Nurse Practitioner

## 2020-09-27 DIAGNOSIS — Z853 Personal history of malignant neoplasm of breast: Secondary | ICD-10-CM | POA: Diagnosis not present

## 2020-09-27 DIAGNOSIS — D509 Iron deficiency anemia, unspecified: Secondary | ICD-10-CM | POA: Insufficient documentation

## 2020-09-27 DIAGNOSIS — C50411 Malignant neoplasm of upper-outer quadrant of right female breast: Secondary | ICD-10-CM

## 2020-09-27 DIAGNOSIS — D5 Iron deficiency 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 (chronic): Secondary | ICD-10-CM

## 2020-09-27 LAB — IRON AND TIBC
Iron: 65 ug/dL (ref 41–142)
Saturation Ratios: 19 % — ABNORMAL LOW (ref 21–57)
TIBC: 340 ug/dL (ref 236–444)
UIBC: 275 ug/dL (ref 120–384)

## 2020-09-27 LAB — COMPREHENSIVE METABOLIC PANEL
ALT: 14 U/L (ref 0–44)
AST: 12 U/L — ABNORMAL LOW (ref 15–41)
Albumin: 3.8 g/dL (ref 3.5–5.0)
Alkaline Phosphatase: 93 U/L (ref 38–126)
Anion gap: 8 (ref 5–15)
BUN: 14 mg/dL (ref 8–23)
CO2: 24 mmol/L (ref 22–32)
Calcium: 9.3 mg/dL (ref 8.9–10.3)
Chloride: 110 mmol/L (ref 98–111)
Creatinine, Ser: 0.86 mg/dL (ref 0.44–1.00)
GFR, Estimated: >60 mL/min (ref >60)
Glucose, Bld: 99 mg/dL (ref 70–99)
Potassium: 4.2 mmol/L (ref 3.5–5.1)
Sodium: 142 mmol/L (ref 135–145)
Total Bilirubin: 0.3 mg/dL (ref 0.3–1.2)
Total Protein: 7.6 g/dL (ref 6.5–8.1)

## 2020-09-27 LAB — CBC WITH DIFFERENTIAL/PLATELET
Abs Immature Granulocytes: 0.06 10*3/uL (ref 0.00–0.07)
Basophils Absolute: 0.1 10*3/uL (ref 0.0–0.1)
Basophils Relative: 1 %
Eosinophils Absolute: 0.2 10*3/uL (ref 0.0–0.5)
Eosinophils Relative: 3 %
HCT: 41 % (ref 36.0–46.0)
Hemoglobin: 13.1 g/dL (ref 12.0–15.0)
Immature Granulocytes: 1 %
Lymphocytes Relative: 24 %
Lymphs Abs: 1.9 10*3/uL (ref 0.7–4.0)
MCH: 27.8 pg (ref 26.0–34.0)
MCHC: 32 g/dL (ref 30.0–36.0)
MCV: 86.9 fL (ref 80.0–100.0)
Monocytes Absolute: 0.6 10*3/uL (ref 0.1–1.0)
Monocytes Relative: 7 %
Neutro Abs: 5.2 10*3/uL (ref 1.7–7.7)
Neutrophils Relative %: 64 %
Platelets: 319 10*3/uL (ref 150–400)
RBC: 4.72 MIL/uL (ref 3.87–5.11)
RDW: 17.7 % — ABNORMAL HIGH (ref 11.5–15.5)
WBC: 8.1 10*3/uL (ref 4.0–10.5)
nRBC: 0 % (ref 0.0–0.2)

## 2020-09-27 LAB — FERRITIN: Ferritin: 71 ng/mL (ref 11–307)

## 2020-09-29 DIAGNOSIS — Z01812 Encounter for preprocedural laboratory examination: Secondary | ICD-10-CM | POA: Diagnosis not present

## 2020-10-04 DIAGNOSIS — D509 Iron deficiency anemia, unspecified: Secondary | ICD-10-CM | POA: Diagnosis not present

## 2020-10-13 ENCOUNTER — Ambulatory Visit
Admission: RE | Admit: 2020-10-13 | Discharge: 2020-10-13 | Disposition: A | Discharge status: Home / Self Care | Payer: Medicare Other | Source: Ambulatory Visit | Attending: Gastroenterology | Admitting: Gastroenterology

## 2020-10-13 ENCOUNTER — Other Ambulatory Visit: Payer: Self-pay | Admitting: Gastroenterology

## 2020-10-13 DIAGNOSIS — Z9889 Other specified postprocedural states: Secondary | ICD-10-CM

## 2020-10-13 DIAGNOSIS — K8080 Other cholelithiasis without obstruction: Secondary | ICD-10-CM | POA: Diagnosis not present

## 2020-10-13 DIAGNOSIS — K449 Diaphragmatic hernia without obstruction or gangrene: Secondary | ICD-10-CM | POA: Diagnosis not present

## 2020-10-13 DIAGNOSIS — K8020 Calculus of gallbladder without cholecystitis without obstruction: Secondary | ICD-10-CM | POA: Diagnosis not present

## 2020-10-19 ENCOUNTER — Other Ambulatory Visit: Payer: Self-pay

## 2020-10-19 ENCOUNTER — Ambulatory Visit (INDEPENDENT_AMBULATORY_CARE_PROVIDER_SITE_OTHER): Payer: Medicare Other | Admitting: Internal Medicine

## 2020-10-19 ENCOUNTER — Encounter: Payer: Self-pay | Admitting: Internal Medicine

## 2020-10-19 VITALS — BP 130/70 | HR 69 | Ht 66.0 in | Wt 184.4 lb

## 2020-10-19 DIAGNOSIS — I25119 Atherosclerotic heart disease of native coronary artery with unspecified angina pectoris: Secondary | ICD-10-CM

## 2020-10-19 DIAGNOSIS — E782 Mixed hyperlipidemia: Secondary | ICD-10-CM

## 2020-10-19 DIAGNOSIS — D5 Iron deficiency 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 (chronic): Secondary | ICD-10-CM | POA: Diagnosis not present

## 2020-10-19 DIAGNOSIS — R0609 Other forms of dyspnea: Secondary | ICD-10-CM

## 2020-10-19 DIAGNOSIS — I10 Essential (primary) hypertension: Secondary | ICD-10-CM

## 2020-10-19 DIAGNOSIS — R0600 Dyspnea, unspecified: Secondary | ICD-10-CM

## 2020-10-19 DIAGNOSIS — G4733 Obstructive sleep apnea (adult) (pediatric): Secondary | ICD-10-CM

## 2020-10-19 NOTE — Patient Instructions (Signed)

## 2020-10-19 NOTE — Progress Notes (Signed)
Cardiology Office Note:    Date:  10/19/2020   ID:  Ashok Cordia, DOB [birth year], MRN [id number]  PCP:  Cari Caraway, MD  Cardiologist:  No primary care provider on file.  Electrophysiologist:  None   Referring MD: Cari Caraway, MD   Chief Complaint/Reason for Referral: DOE  History of Present Illness:    Jessica Roberts is a 75 y.o. female with a history of right breast cancer with radiation and surgery, large hiatal hernia with significant GERD, iron deficiency anemia. Presents for follow up.     Overall improved but slight DOE. Working with a Clinical research associate. Received IV iron and feels this helped. Anemia improved.   The patient denies chest pain, chest pressure, palpitations, PND, orthopnea, or leg swelling. Denies cough, fever, chills. Denies nausea, vomiting. Denies syncope or presyncope. Denies dizziness or lightheadedness.    Past Medical History:  Diagnosis Date  . Allergy   . Anxiety   . Anxiety and depression   . Breast cancer ([place]) 04/07/15   right breast  . Breast cancer of upper-outer quadrant of right female breast ([place]) 04/11/2015  . Broken nose   . Complication of anesthesia    "had a very hard time waking up"  . Dengue fever   . Depression   . Fall    broken nose  . GERD (gastroesophageal reflux disease)   . Head injury 1995   concussion in [place], med evac to [place], ear damage with vertigo  . Head injury, acute, with loss of consciousness ([place]) 2000   [place] the [place]  . Head injury, closed, with concussion 1986   [place]  . Headache   . Hypertension   . Malaria   . PONV (postoperative nausea and vomiting)   . Typhoid     Past Surgical History:  Procedure Laterality Date  . BREAST LUMPECTOMY Right 04/27/2015  . BREAST LUMPECTOMY WITH NEEDLE LOCALIZATION AND AXILLARY SENTINEL LYMPH NODE BX Right 04/27/2015   Procedure: RIGHT BREAST LUMPECTOMY WITH  TWO NEEDLE LOCALIZATION AND TWO RIGHT AXILLARY SENTINEL LYMPH NODE BX;   Surgeon: Fanny Skates, MD;  Location: [facility];  Service: General;  Laterality: Right;  . COLONOSCOPY W/ BIOPSIES AND POLYPECTOMY    . NERVE SURGERY     '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' during surgery for endometriosis  . surgical procedure for endometriosis    . TONSILLECTOMY      Current Medications: Current Meds  Medication Sig  . acetaminophen (TYLENOL) 500 MG tablet Take 500 mg by mouth 2 (two) times daily as needed for moderate pain (pain). Reported on 10/12/2015  . albuterol (PROVENTIL HFA;VENTOLIN HFA) 108 (90 Base) MCG/ACT inhaler Inhale 1 puff into the lungs every 6 (six) hours as needed for wheezing or shortness of breath.  Jearl Klinefelter ELLIPTA 62.5-25 MCG/INH AEPB Inhale 1 puff into the lungs daily.  . ARIPiprazole (ABILIFY) 2 MG tablet Take 2 mg by mouth daily.  Marland Kitchen aspirin EC 81 MG tablet Take 1 tablet (81 mg total) by mouth daily. Swallow whole.  Marland Kitchen atorvastatin (LIPITOR) 40 MG tablet Take 1 tablet (40 mg total) by mouth daily.  . metroNIDAZOLE (METROGEL) 0.75 % gel metronidazole 0.75 % topical gel  . sertraline (ZOLOFT) 100 MG tablet Take 100 mg by mouth daily.      Allergies:   Codeine, Oxycontin [oxycodone hcl], Percocet [oxycodone-acetaminophen], and Percodan [oxycodone-aspirin]   Social History   Tobacco Use  . Smoking status: Former Smoker    Packs/day: 1.50  Years: 30.00    Pack years: 45.00    Types: Cigarettes    Quit date: 01/11/1994    Years since quitting: 26.7  . Smokeless tobacco: Never Used  Substance Use Topics  . Alcohol use: Yes    Alcohol/week: 4.0 standard drinks    Types: 4 Glasses of wine per week    Comment: moderate   . Drug use: No     Family History: The patient's family history includes Alcohol abuse in her paternal grandfather; Breast cancer in her mother; Diabetes in her maternal grandmother; Emphysema in her father; Heart disease in her paternal grandmother; Hypertension in her mother; Thyroid disease in her mother.  ROS:   Please see  the history of present illness.    All other systems reviewed and are negative.  EKGs/Labs/Other Studies Reviewed:    The following studies were reviewed today:  EKG:  NSR, rate 69  Recent Labs: 09/27/2020: ALT 14; BUN 14; Creatinine, Ser 0.86; Hemoglobin 13.1; Platelets 319; Potassium 4.2; Sodium 142  Recent Lipid Panel No results found for: CHOL, TRIG, HDL, CHOLHDL, VLDL, LDLCALC, LDLDIRECT  Physical Exam:    VS:  BP 130/70   Pulse 69   Ht 5\' 6"  (1.676 m)   Wt 184 lb 6.4 oz (83.6 kg)   BMI 29.76 kg/m     Wt Readings from Last 5 Encounters:  10/19/20 184 lb 6.4 oz (83.6 kg)  08/25/20 181 lb 9.6 oz (82.4 kg)  07/22/20 182 lb (82.6 kg)  07/18/20 183 lb 6.4 oz (83.2 kg)  06/06/20 182 lb 6.4 oz (82.7 kg)    Constitutional: No acute distress Eyes: sclera non-icteric, normal conjunctiva and lids ENMT: normal dentition, moist mucous membranes Cardiovascular: regular rhythm, normal rate, no murmurs. S1 and S2 normal. Radial pulses normal bilaterally. No jugular venous distention.  Respiratory: clear to auscultation bilaterally GI : normal bowel sounds, soft and nontender. No distention.   MSK: extremities warm, well perfused. No edema.  NEURO: grossly nonfocal exam, moves all extremities. PSYCH: alert and oriented x 3, normal mood and affect.   ASSESSMENT:    1. Essential hypertension   2. Coronary artery disease involving native heart with angina pectoris, unspecified vessel or lesion type ([place])   3. DOE (dyspnea on exertion)   4. Iron deficiency 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 (chronic)   5. OSA (obstructive sleep apnea)   6. Mixed hyperlipidemia    PLAN:    Essential hypertension - Plan: EKG 12-Lead Coronary artery disease involving native heart with angina pectoris, unspecified vessel or lesion type ([place]) - doing well on ASA and statin, continue.  - ok to follow lipids and CMP with PCP, otherwise we are happy to coordinate and follow.  DOE (dyspnea on  exertion) Iron deficiency 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 (chronic) - improved with treatment of IDA.  - Recommend moderate exercise program.   Total time of encounter: 36 minutes total time of encounter, including 25 minutes spent in face-to-face patient care on the date of this encounter. This time includes coordination of care and counseling regarding above mentioned problem list. Remainder of non-face-to-face time involved reviewing chart documents/testing relevant to the patient encounter and documentation in the medical record. I have independently reviewed documentation from referring provider.   Cherlynn Kaiser, MD, [facility]    Medication Adjustments/Labs and Tests Ordered: Current medicines are reviewed at length with the patient today.  Concerns regarding medicines are outlined above.   Orders Placed This Encounter  Procedures  . EKG 12-Lead    No orders of the defined types were placed in this encounter.   Patient Instructions  Medication Instructions:  No Changes In Medications at this time.  *If you need a refill on your cardiac medications before your next appointment, please call your pharmacy*  Follow-Up: At [facility], you and your health needs are our priority.  As part of our continuing mission to provide you with exceptional heart care, we have created designated Provider Care Teams.  These Care Teams include your primary Cardiologist (physician) and Advanced Practice Providers (APPs -  Physician Assistants and Nurse Practitioners) who all work together to provide you with the care you need, when you need it.  Your next appointment:   6 month(s)  The format for your next appointment:   In Person  Provider:   Cherlynn Kaiser, MD

## 2020-10-21 NOTE — Progress Notes (Signed)
[place]   [phone] Fax:(336) [phone]   Clinic Follow up Note   Patient Care Team: Cari Caraway, MD as PCP - General (Family Medicine) Fanny Skates, MD as Consulting Physician (General Surgery) Truitt Merle, MD as Consulting Physician (Hematology) Thea Silversmith, MD as Consulting Physician (Radiation Oncology) Mauro Kaufmann, RN as Registered Nurse Rockwell Germany, RN as Registered Nurse Jake Shark, Johny Blamer, NP as Nurse Practitioner (Nurse Practitioner) Wilford Corner, MD as Consulting Physician (Gastroenterology)  Date of Service:  10/26/2020  CHIEF COMPLAINT: F/u of right breast cancer   SUMMARY OF ONCOLOGIC HISTORY: Oncology History Overview Note  Cancer Staging Breast cancer of upper-outer quadrant of right female breast [facility]) Staging form: Breast, AJCC 7th Edition - Clinical stage from 04/07/2015: Stage IA (T1a, N0, M0) - Signed by Truitt Merle, MD on 05/20/2015 - Pathologic stage from 04/27/2015: Stage IA (T1b, N0, cM0) - Signed by Truitt Merle, MD on 05/20/2015       Breast cancer of upper-outer quadrant of right female breast ([place])  04/01/2015 Mammogram   Diagnostic mammogram and US showed a 0.7x 0.4 x 0.4 cm mass in the right breast 11:00 position, and a second 17m lesion at upper-midline, and a benign cluster of cysts measuring 1cm.    04/07/2015 Initial Biopsy   Right breast UOQ mass core needle biopsy showed invasive ductal carcinoma and DCIS, grade 1. ER+ (100%), PR+ (80%), HER2/neu negative (ratio 1.25), Ki67 5%.   04/07/2015 Clinical Stage   Stage IA: T1a N0   04/27/2015 Definitive Surgery   Right breast double lumpectomy/SLNB: IDC, 0.9 cm, DCIS, invasive dz 0.1 cm from posterior margin, rem. margins >0.5 cm; second lump: fibrocystic change. 1 LN negative for malignancy   04/27/2015 Pathologic Stage   Stage IA: T1b N0   06/21/2015 - 07/13/2015 Radiation Therapy   Adjuvant RT: Right breast 42.72 Gy over 21 fractions.      Anti-estrogen oral therapy   Pt declined adjuvant anti-estrogen therapy    09/29/2015 Survivorship   Survivorship visit completed and copy of care plan given to patient.   07/17/2016 Imaging   MM DIAG BREAST TOMO BILATERALL 07/17/16 MPRESSION: Probable mildly complicated fluid collection/seroma within the right breast 1130 o'clock along the posterior margin of the lumpectomy site.   09/04/2016 Imaging   [place] RIGHT [place]: 09/04/16 IMPRESSION: Stable probably benign postsurgical fluid collection/seroma within the right breast at the 11:30 o'clock axis, 6 cm from the nipple, measuring 2.3 x 0.9 x 1.2 cm. Recommend additional follow-up right breast diagnostic mammogram and ultrasound in 6 months to ensure continued stability.   03/21/2017 Mammogram   R breast Mammogram and [place] Mammographic images were processed with CAD.  Targeted ultrasound is performed, showing slightly smaller mixed echogenicity collection within the right breast at 11:30 o'clock 6 cm from nipple measuring 0.9 x 0.7 x 1.4 cm. This is probably a postsurgical seroma.   07/31/2017 Mammogram   IMPRESSION: 1. Indeterminate 9 mm group of fine pleomorphic calcifications involving the upper outer quadrant of the right breast at posterior depth. 2. New partially obscured mass involving the inner left breast at anterior posterior depth.   08/07/2017 Breast [place]  Targeted right breast ultrasound is performed, showing the previously identified mildly complex fluid collection at the lumpectomy site at the 11:30 o'clock approximately 6 cm from the nipple has decreased in size in the interval, currently measuring 6 x 8 x 8 mm (previously 7 x 9 by 14 mm). No  new suspicious solid mass or abnormal acoustic shadowing is identified.  Targeted left breast ultrasound is performed, showing that the new mammographic mass corresponds to an oval circumscribed parallel hypoechoic mass at the 10 o'clock position approximately 4 cm  from the nipple measuring approximately 5 x 4 x 5 mm, demonstrating slight posterior acoustic enhancement and no internal power Doppler flow.  IMPRESSION: 1. Interval decrease in size of the postoperative seroma at the lumpectomy site in the upper outer quadrant of the right breast. 2. Likely benign 5 mm complex cyst in the upper inner quadrant of the left breast accounting for the new mammographic finding.   08/07/2017 Procedure   aspiration of the complex cyst in the upper inner quadrant of the left breast at the 10 o'clock position approximately 4 cm from the nipple. Less than 1 cc of cyst fluid was aspirated. The cyst completely collapsed with aspiration and there is no associated solid component.   08/07/2017 Pathology Results   Diagnosis Breast, left, needle core biopsy, UIQ at posterior depth - BENIGN BREAST TISSUE WITH CALCIFICATIONS. - NO MALIGNANCY IDENTIFIED. - SEE COMMENT.    03/10/2018 Mammogram    03/10/2018 Mammogram IMPRESSION: No mammographic evidence of malignancy involving the LEFT breast.      CURRENT THERAPY:  Surveillance  INTERVAL HISTORY:  Jessica Roberts is here for a follow up of her right breast cancer. She was last seen by me 3 months ago. She presents to the clinic alone. She notes she is doing well and better than last visit. her SOB has improved. She notes she is on oral iron, s/p iron infusions and start of exercising at the gym she has improved. I reviewed her medication list with her. She is on oral iron intermittently. She notes no new breast changes or new pain.     REVIEW OF SYSTEMS:   Constitutional: Denies fevers, chills or abnormal weight loss Eyes: Denies blurriness of vision Ears, nose, mouth, throat, and face: Denies mucositis or sore throat Respiratory: Denies cough, dyspnea or wheezes Cardiovascular: Denies palpitation, chest discomfort or lower extremity swelling Gastrointestinal:  Denies nausea, heartburn or change in  bowel habits Skin: Denies abnormal skin rashes Lymphatics: Denies new lymphadenopathy or easy bruising Neurological:Denies numbness, tingling or new weaknesses Behavioral/Psych: Mood is stable, no new changes  All other systems were reviewed with the patient and are negative.  MEDICAL HISTORY:  Past Medical History:  Diagnosis Date  . Allergy   . Anxiety   . Anxiety and depression   . Breast cancer ([place]) 04/07/15   right breast  . Breast cancer of upper-outer quadrant of right female breast ([place]) 04/11/2015  . Broken nose   . Complication of anesthesia    "had a very hard time waking up"  . Dengue fever   . Depression   . Fall    broken nose  . GERD (gastroesophageal reflux disease)   . Head injury 1995   concussion in [place], med evac to [place], ear damage with vertigo  . Head injury, acute, with loss of consciousness ([place]) 2000   [place] of the country  . Head injury, closed, with concussion 1986   [place]  . Headache   . Hypertension   . Malaria   . PONV (postoperative nausea and vomiting)   . Typhoid     SURGICAL HISTORY: Past Surgical History:  Procedure Laterality Date  . BREAST LUMPECTOMY Right 04/27/2015  . BREAST LUMPECTOMY WITH NEEDLE LOCALIZATION AND AXILLARY SENTINEL LYMPH NODE BX Right  04/27/2015   Procedure: RIGHT BREAST LUMPECTOMY WITH  TWO NEEDLE LOCALIZATION AND TWO RIGHT AXILLARY SENTINEL LYMPH NODE BX;  Surgeon: Fanny Skates, MD;  Location: [facility];  Service: General;  Laterality: Right;  . COLONOSCOPY W/ BIOPSIES AND POLYPECTOMY    . NERVE SURGERY     '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' during surgery for endometriosis  . surgical procedure for endometriosis    . TONSILLECTOMY      I have reviewed the social history and family history with the patient and they are unchanged from previous note.  ALLERGIES:  is allergic to codeine, oxycontin [oxycodone hcl], percocet [oxycodone-acetaminophen], and percodan [oxycodone-aspirin].  MEDICATIONS:  Current  Outpatient Medications  Medication Sig Dispense Refill  . acetaminophen (TYLENOL) 500 MG tablet Take 500 mg by mouth 2 (two) times daily as needed for moderate pain (pain). Reported on 10/12/2015    . albuterol (PROVENTIL HFA;VENTOLIN HFA) 108 (90 Base) MCG/ACT inhaler Inhale 1 puff into the lungs every 6 (six) hours as needed for wheezing or shortness of breath.    Jearl Klinefelter ELLIPTA 62.5-25 MCG/INH AEPB Inhale 1 puff into the lungs daily.    . ARIPiprazole (ABILIFY) 2 MG tablet Take 2 mg by mouth daily.    Marland Kitchen aspirin EC 81 MG tablet Take 1 tablet (81 mg total) by mouth daily. Swallow whole. 90 tablet 3  . atorvastatin (LIPITOR) 40 MG tablet Take 1 tablet (40 mg total) by mouth daily. 90 tablet 3  . metroNIDAZOLE (METROGEL) 0.75 % gel metronidazole 0.75 % topical gel    . sertraline (ZOLOFT) 100 MG tablet Take 100 mg by mouth daily.      No current facility-administered medications for this visit.    PHYSICAL EXAMINATION: ECOG PERFORMANCE STATUS: 0 - Asymptomatic  Vitals:   10/26/20 1124  BP: (!) 133/54  Pulse: 79  Resp: 15  Temp: 97.7 F (36.5 C)  SpO2: 98%   Filed Weights   10/26/20 1124  Weight: 184 lb 3.2 oz (83.6 kg)    GENERAL:alert, no distress and comfortable SKIN: skin color, texture, turgor are normal, no rashes or significant lesions EYES: normal, Conjunctiva are pink and non-injected, sclera clear  NECK: supple, thyroid normal size, non-tender, without nodularity LYMPH:  no palpable lymphadenopathy in the cervical, axillary  LUNGS: clear to auscultation and percussion with normal breathing effort HEART: regular rate & rhythm and no murmurs and no lower extremity edema ABDOMEN:abdomen soft, non-tender and normal bowel sounds Musculoskeletal:no cyanosis of digits and no clubbing  NEURO: alert & oriented x 3 with fluent speech, no focal motor/sensory deficits BREAST: s/p right lumpectomy: surgical incision healed very well with mild tenderness. No palpable mass, nodules  or adenopathy bilaterally. Breast exam benign.   LABORATORY DATA:  I have reviewed the data as listed CBC Latest Ref Rng & Units 09/27/2020 08/23/2020 07/18/2020  WBC 4.0 - 10.5 K/uL 8.1 8.2 11.0(H)  Hemoglobin 12.0 - 15.0 g/dL 13.1 11.9(L) 9.4(L)  Hematocrit 36.0 - 46.0 % 41.0 39.0 32.3(L)  Platelets 150 - 400 K/uL 319 318 417(H)     CMP Latest Ref Rng & Units 09/27/2020 08/23/2020 07/18/2020  Glucose 70 - 99 mg/dL 99 113(H) 100(H)  BUN 8 - 23 mg/dL _0 Creatinine 0.44 - 1.00 mg/dL 0.86 0.77 0.74  Sodium 135 - 145 mmol/L 142 141 141  Potassium 3.5 - 5.1 mmol/L 4.2 4.6 4.1  Chloride 98 - 111 mmol/L 110 108 107  CO2 22 - 32 mmol/L _1 Calcium  8.9 - 10.3 mg/dL 9.3 9.4 9.6  Total Protein 6.5 - 8.1 g/dL 7.6 7.5 7.6  Total Bilirubin 0.3 - 1.2 mg/dL 0.3 0.3 0.4  Alkaline Phos 38 - 126 U/L 93 89 89  AST 15 - 41 U/L 12(L) 12(L) 12(L)  ALT 0 - 44 U/L _0 RADIOGRAPHIC STUDIES: I have personally reviewed the radiological images as listed and agreed with the findings in the report. No results found.   ASSESSMENT & PLAN:  Jessica Roberts is a 75 y.o. female with    1. Right breast invasive ductal carcinoma, pT1bN0M0, stage IA, grade 1, ER positive, PR positive, HER-2 negative, (+) DCIS and ADH -Diagnosed in 2016. Treated with right lumpectomy and radiation. She declinedantihormonal therapy. Currently on surveillance -From a breast cancer standpoint, She is clinically doing well. Lab reviewed, her CBC and CMP are within normal limits. Her physical exam and her 02/2020 mammogram were unremarkable. There is no clinical concern for recurrence. -Continue surveillance. Next mammogram in 02/2021.  -F/u in 6 months   2. Iron deficient anemia -She has had anemia intermittently since 2020. Pt denies overt bleeding.  -She has colonoscopy and endoscopy (including small bowel) on 07/15/20 with Dr Michail Sermon.Per pt results were benign. Per patient results were benign and no  bleeding with hiatal hernia and diverticulosis and gastritis and gallstones. I will obtain final report from Dr Michail Sermon.  -She is on Oral iron once daily. She was treated with IV Venofer 437m on 07/26/20, 08/09/20, 08/15/20.  -She notes symptoms of SOB and fatigue improved recently. Most recent labs show no anemia. Iron panel today still pending (10/26/20) -She has been encouraged to change her diet and continue to exercise.  -Will monitor with lab every 2 months   3. Depression -Currently on Zoloft, bupropion, and diazepam. -stable  4. Vitamin D Deficiency  -Currently on Vitamin D 1000 IU daily -will monitor closely   Plan -request endoscopy report form Dr SMichail Sermon  -Lab every 2 months X3 to follow up anemia, set up iv iron if ferritin<20 -f/u in 6 months.    No problem-specific Assessment & Plan notes found for this encounter.   Orders Placed This Encounter  Procedures  . MM DIAG BREAST TOMO BILATERAL    Standing Status:   Future    Standing Expiration Date:   10/26/2021    Order Specific Question:   Reason for Exam (SYMPTOM  OR DIAGNOSIS REQUIRED)    Answer:   screening    Order Specific Question:   [facility]    [facility])    Standing Status:   Standing    Number of Occurrences:   10    Standing Expiration Date:   10/26/2021  . [place]    Standing Status:   Standing    Number of Occurrences:   10    Standing Expiration Date:   10/26/2021   All questions were answered. The patient knows to call the clinic with any problems, questions or concerns. No barriers to learning was detected. The total time spent in the appointment was 30 minutes.     YTruitt Merle MD 10/26/2020   I, AJoslyn Devon am acting as scribe for YTruitt Merle MD.   I have reviewed the above documentation for accuracy and completeness, and I agree with the above.

## 2020-10-26 ENCOUNTER — Telehealth: Payer: Self-pay | Admitting: Hematology

## 2020-10-26 ENCOUNTER — Inpatient Hospital Stay (HOSPITAL_BASED_OUTPATIENT_CLINIC_OR_DEPARTMENT_OTHER): Payer: Medicare Other | Admitting: Hematology

## 2020-10-26 ENCOUNTER — Encounter: Payer: Self-pay | Admitting: Hematology

## 2020-10-26 ENCOUNTER — Other Ambulatory Visit: Payer: Self-pay

## 2020-10-26 ENCOUNTER — Inpatient Hospital Stay: Payer: Medicare Other | Attending: Nurse Practitioner

## 2020-10-26 VITALS — BP 133/54 | HR 79 | Temp 97.7°F | Resp 15 | Ht 66.0 in | Wt 184.2 lb

## 2020-10-26 DIAGNOSIS — F32A Depression, unspecified: Secondary | ICD-10-CM | POA: Diagnosis not present

## 2020-10-26 DIAGNOSIS — D509 Iron deficiency anemia, unspecified: Secondary | ICD-10-CM | POA: Insufficient documentation

## 2020-10-26 DIAGNOSIS — Z853 Personal history of malignant neoplasm of breast: Secondary | ICD-10-CM | POA: Diagnosis not present

## 2020-10-26 DIAGNOSIS — Z170 Estrogen receptor positive status [ER+]: Secondary | ICD-10-CM | POA: Diagnosis not present

## 2020-10-26 DIAGNOSIS — E559 Vitamin D deficiency, unspecified: Secondary | ICD-10-CM | POA: Insufficient documentation

## 2020-10-26 DIAGNOSIS — C50411 Malignant neoplasm of upper-outer quadrant of right female breast: Secondary | ICD-10-CM | POA: Diagnosis not present

## 2020-10-26 DIAGNOSIS — D5 Iron deficiency 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 (chronic): Secondary | ICD-10-CM

## 2020-10-26 LAB — IRON AND TIBC
Iron: 52 ug/dL (ref 41–142)
Saturation Ratios: 13 % — ABNORMAL LOW (ref 21–57)
TIBC: 384 ug/dL (ref 236–444)
UIBC: 332 ug/dL (ref 120–384)

## 2020-10-26 LAB — FERRITIN: Ferritin: 26 ng/mL (ref 11–307)

## 2020-10-26 NOTE — Telephone Encounter (Signed)
Scheduled appt per 3/16 los - gave patient AVS

## 2020-10-27 ENCOUNTER — Encounter: Payer: Self-pay | Admitting: Hematology

## 2020-11-14 DIAGNOSIS — L718 Other rosacea: Secondary | ICD-10-CM | POA: Diagnosis not present

## 2020-11-14 DIAGNOSIS — Z85828 Personal history of other malignant neoplasm of skin: Secondary | ICD-10-CM | POA: Diagnosis not present

## 2020-11-14 DIAGNOSIS — L820 Inflamed seborrheic keratosis: Secondary | ICD-10-CM | POA: Diagnosis not present

## 2020-12-28 ENCOUNTER — Other Ambulatory Visit: Payer: Self-pay

## 2020-12-28 ENCOUNTER — Inpatient Hospital Stay: Payer: Medicare Other | Attending: Nurse Practitioner

## 2020-12-28 DIAGNOSIS — Z853 Personal history of malignant neoplasm of breast: Secondary | ICD-10-CM | POA: Diagnosis not present

## 2020-12-28 DIAGNOSIS — D509 Iron deficiency anemia, unspecified: Secondary | ICD-10-CM | POA: Insufficient documentation

## 2020-12-28 DIAGNOSIS — Z170 Estrogen receptor positive status [ER+]: Secondary | ICD-10-CM

## 2020-12-28 DIAGNOSIS — D5 Iron deficiency 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 (chronic): Secondary | ICD-10-CM

## 2020-12-28 DIAGNOSIS — E2839 Other primary ovarian failure: Secondary | ICD-10-CM

## 2020-12-28 LAB — CMP (CANCER CENTER ONLY)
ALT: 10 U/L (ref 0–44)
AST: 12 U/L — ABNORMAL LOW (ref 15–41)
Albumin: 3.5 g/dL (ref 3.5–5.0)
Alkaline Phosphatase: 101 U/L (ref 38–126)
Anion gap: 8 (ref 5–15)
BUN: 18 mg/dL (ref 8–23)
CO2: 25 mmol/L (ref 22–32)
Calcium: 9.2 mg/dL (ref 8.9–10.3)
Chloride: 108 mmol/L (ref 98–111)
Creatinine: 0.72 mg/dL (ref 0.44–1.00)
GFR, Estimated: >60 mL/min (ref >60)
Glucose, Bld: 104 mg/dL — ABNORMAL HIGH (ref 70–99)
Potassium: 4.7 mmol/L (ref 3.5–5.1)
Sodium: 141 mmol/L (ref 135–145)
Total Bilirubin: 0.4 mg/dL (ref 0.3–1.2)
Total Protein: 7.1 g/dL (ref 6.5–8.1)

## 2020-12-28 LAB — CBC WITH DIFFERENTIAL (CANCER CENTER ONLY)
Abs Immature Granulocytes: 0.06 10*3/uL (ref 0.00–0.07)
Basophils Absolute: 0.1 10*3/uL (ref 0.0–0.1)
Basophils Relative: 2 %
Eosinophils Absolute: 0.3 10*3/uL (ref 0.0–0.5)
Eosinophils Relative: 3 %
HCT: 30.9 % — ABNORMAL LOW (ref 36.0–46.0)
Hemoglobin: 9.5 g/dL — ABNORMAL LOW (ref 12.0–15.0)
Immature Granulocytes: 1 %
Lymphocytes Relative: 25 %
Lymphs Abs: 2.2 10*3/uL (ref 0.7–4.0)
MCH: 27.5 pg (ref 26.0–34.0)
MCHC: 30.7 g/dL (ref 30.0–36.0)
MCV: 89.6 fL (ref 80.0–100.0)
Monocytes Absolute: 0.7 10*3/uL (ref 0.1–1.0)
Monocytes Relative: 8 %
Neutro Abs: 5.6 10*3/uL (ref 1.7–7.7)
Neutrophils Relative %: 61 %
Platelet Count: 408 10*3/uL — ABNORMAL HIGH (ref 150–400)
RBC: 3.45 MIL/uL — ABNORMAL LOW (ref 3.87–5.11)
RDW: 14.5 % (ref 11.5–15.5)
WBC Count: 8.9 10*3/uL (ref 4.0–10.5)
nRBC: 0 % (ref 0.0–0.2)

## 2020-12-28 LAB — FERRITIN: Ferritin: 8 ng/mL — ABNORMAL LOW (ref 11–307)

## 2020-12-28 LAB — IRON AND TIBC
Iron: 27 ug/dL — ABNORMAL LOW (ref 41–142)
Saturation Ratios: 6 % — ABNORMAL LOW (ref 21–57)
TIBC: 426 ug/dL (ref 236–444)
UIBC: 399 ug/dL — ABNORMAL HIGH (ref 120–384)

## 2020-12-28 LAB — VITAMIN D 25 HYDROXY (VIT D DEFICIENCY, FRACTURES): Vit D, 25-Hydroxy: 30.89 ng/mL (ref 30–100)

## 2021-01-03 ENCOUNTER — Telehealth: Payer: Self-pay

## 2021-01-03 ENCOUNTER — Telehealth: Payer: Self-pay | Admitting: Hematology

## 2021-01-03 NOTE — Telephone Encounter (Signed)
Scheduled appts per 5/24 sch msg. Pt aware.  

## 2021-01-03 NOTE — Telephone Encounter (Signed)
-----   Message from Truitt Merle, MD sent at 12/30/2020  6:23 PM EDT ----- Please let pt know she is anemic again and iron is low, please schedule iv venofer 400mg  weekly X3, and repeat lab and f/u with last dose, thanks   Truitt Merle  12/30/2020

## 2021-01-03 NOTE — Telephone Encounter (Signed)
This nurse called and spoke with patient.  Informed her of lab results and new orders from Dr. Burr Medico.  Advised that scheduling has been made aware of the orders and will reach out to her to schedule Venofer infusions.  Patient acknowledged understanding and knows to call clinic with any questions or concerns.

## 2021-01-05 DIAGNOSIS — R350 Frequency of micturition: Secondary | ICD-10-CM | POA: Diagnosis not present

## 2021-01-12 ENCOUNTER — Telehealth: Payer: Self-pay | Admitting: Hematology

## 2021-01-12 NOTE — Telephone Encounter (Signed)
Rescheduled per list from [facility]. Called and spoke with pt, confirmed new appt time on 6/15

## 2021-01-25 ENCOUNTER — Ambulatory Visit: Payer: Medicare Other

## 2021-01-25 ENCOUNTER — Inpatient Hospital Stay: Payer: Medicare Other | Attending: Nurse Practitioner

## 2021-01-25 ENCOUNTER — Other Ambulatory Visit: Payer: Self-pay

## 2021-01-25 VITALS — BP 128/46 | HR 71 | Temp 99.1°F | Resp 17

## 2021-01-25 DIAGNOSIS — Z853 Personal history of malignant neoplasm of breast: Secondary | ICD-10-CM | POA: Diagnosis not present

## 2021-01-25 DIAGNOSIS — F32A Depression, unspecified: Secondary | ICD-10-CM | POA: Insufficient documentation

## 2021-01-25 DIAGNOSIS — E559 Vitamin D deficiency, unspecified: Secondary | ICD-10-CM | POA: Diagnosis not present

## 2021-01-25 DIAGNOSIS — D5 Iron deficiency 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 (chronic): Secondary | ICD-10-CM

## 2021-01-25 DIAGNOSIS — D509 Iron deficiency anemia, unspecified: Secondary | ICD-10-CM | POA: Diagnosis not present

## 2021-01-25 MED ORDER — SODIUM CHLORIDE 0.9 % IV SOLN
Freq: Once | INTRAVENOUS | Status: AC
Start: 2021-01-25 — End: 2021-01-25
  Filled 2021-01-25: qty 250

## 2021-01-25 MED ORDER — DIPHENHYDRAMINE HCL 25 MG PO CAPS
ORAL_CAPSULE | ORAL | Status: AC
  Filled 2021-01-25: qty 1

## 2021-01-25 MED ORDER — DIPHENHYDRAMINE HCL 25 MG PO CAPS
25.0000 mg | ORAL_CAPSULE | Freq: Once | ORAL | Status: AC
  Administered 2021-01-25: 25 mg via ORAL

## 2021-01-25 MED ORDER — SODIUM CHLORIDE 0.9 % IV SOLN
400.0000 mg | Freq: Once | INTRAVENOUS | Status: AC
  Administered 2021-01-25: 400 mg via INTRAVENOUS
  Filled 2021-01-25: qty 20

## 2021-01-25 NOTE — Progress Notes (Signed)
Pt declined to stay 30 min post venofer infusion. VSS and pt discharged in stable condition, ambulatory to lobby.

## 2021-01-25 NOTE — Patient Instructions (Signed)

## 2021-01-30 DIAGNOSIS — D5 Iron deficiency 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 (chronic): Secondary | ICD-10-CM | POA: Diagnosis not present

## 2021-01-30 DIAGNOSIS — K2970 Gastritis, unspecified, without bleeding: Secondary | ICD-10-CM | POA: Diagnosis not present

## 2021-01-30 DIAGNOSIS — K219 Gastro-esophageal reflux disease without esophagitis: Secondary | ICD-10-CM | POA: Diagnosis not present

## 2021-01-30 DIAGNOSIS — K5730 Diverticulosis of large intestine without perforation or abscess without bleeding: Secondary | ICD-10-CM | POA: Diagnosis not present

## 2021-02-01 ENCOUNTER — Other Ambulatory Visit: Payer: Self-pay

## 2021-02-01 ENCOUNTER — Inpatient Hospital Stay: Payer: Medicare Other

## 2021-02-01 VITALS — BP 129/65 | HR 63 | Temp 97.8°F | Resp 17

## 2021-02-01 DIAGNOSIS — E559 Vitamin D deficiency, unspecified: Secondary | ICD-10-CM | POA: Diagnosis not present

## 2021-02-01 DIAGNOSIS — D5 Iron deficiency 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 (chronic): Secondary | ICD-10-CM

## 2021-02-01 DIAGNOSIS — F32A Depression, unspecified: Secondary | ICD-10-CM | POA: Diagnosis not present

## 2021-02-01 DIAGNOSIS — D509 Iron deficiency anemia, unspecified: Secondary | ICD-10-CM | POA: Diagnosis not present

## 2021-02-01 DIAGNOSIS — Z853 Personal history of malignant neoplasm of breast: Secondary | ICD-10-CM | POA: Diagnosis not present

## 2021-02-01 MED ORDER — DIPHENHYDRAMINE HCL 25 MG PO CAPS
ORAL_CAPSULE | ORAL | Status: AC
  Filled 2021-02-01: qty 1

## 2021-02-01 MED ORDER — DIPHENHYDRAMINE HCL 25 MG PO CAPS
25.0000 mg | ORAL_CAPSULE | Freq: Once | ORAL | Status: AC
  Administered 2021-02-01: 25 mg via ORAL

## 2021-02-01 MED ORDER — SODIUM CHLORIDE 0.9 % IV SOLN
400.0000 mg | Freq: Once | INTRAVENOUS | Status: AC
  Administered 2021-02-01: 400 mg via INTRAVENOUS
  Filled 2021-02-01: qty 20

## 2021-02-01 MED ORDER — SODIUM CHLORIDE 0.9 % IV SOLN
INTRAVENOUS | Status: DC
  Filled 2021-02-01: qty 250

## 2021-02-01 NOTE — Patient Instructions (Signed)

## 2021-02-07 NOTE — Progress Notes (Signed)
[facility]   [phone] [phone]   Clinic Follow up Note   Patient Care Team: Derinda Late, MD as PCP - General (Family Medicine) Fanny Skates, MD as Consulting Physician (General Surgery) Truitt Merle, MD as Consulting Physician (Hematology) Thea Silversmith, MD as Consulting Physician (Radiation Oncology) Mauro Kaufmann, RN as Registered Nurse Rockwell Germany, RN as Registered Nurse Jake Shark, Johny Blamer, NP as Nurse Practitioner (Nurse Practitioner) Wilford Corner, MD as Consulting Physician (Gastroenterology)  Date of Service:  02/08/2021  CHIEF COMPLAINT: F/u of IDA and breast cancer   SUMMARY OF ONCOLOGIC HISTORY: Oncology History Overview Note  Cancer Staging Breast cancer of upper-outer quadrant of right female breast [facility]) Staging form: Breast, AJCC 7th Edition - Clinical stage from 04/07/2015: Stage IA (T1a, N0, M0) - Signed by Truitt Merle, MD on 05/20/2015 - Pathologic stage from 04/27/2015: Stage IA (T1b, N0, cM0) - Signed by Truitt Merle, MD on 05/20/2015        Breast cancer of upper-outer quadrant of right female breast ([place])  04/01/2015 Mammogram   Diagnostic mammogram and US showed a 0.7x 0.4 x 0.4 cm mass in the right breast 11:00 position, and a second 35mm lesion at upper-midline, and a benign cluster of cysts measuring 1cm.     04/07/2015 Initial Biopsy   Right breast UOQ mass core needle biopsy showed invasive ductal carcinoma and DCIS, grade 1. ER+ (100%), PR+ (80%), HER2/neu negative (ratio 1.25), Ki67 5%.    04/07/2015 Clinical Stage   Stage IA: T1a N0    04/27/2015 Definitive Surgery   Right breast double lumpectomy/SLNB: IDC, 0.9 cm, DCIS, invasive dz 0.1 cm from posterior margin, rem. margins >0.5 cm; second lump: fibrocystic change. 1 LN negative for malignancy    04/27/2015 Pathologic Stage   Stage IA: T1b N0    06/21/2015 - 07/13/2015 Radiation Therapy   Adjuvant RT: Right breast 42.72 Gy over 21  fractions.      Anti-estrogen oral therapy   Pt declined adjuvant anti-estrogen therapy    09/29/2015 Survivorship   Survivorship visit completed and copy of care plan given to patient.    07/17/2016 Imaging   MM DIAG BREAST TOMO BILATERALL 07/17/16 MPRESSION: Probable mildly complicated fluid collection/seroma within the right breast 1130 o'clock along the posterior margin of the lumpectomy site.    09/04/2016 Imaging   US BREAST LTD UNI RIGHT INC AXILLA: 09/04/16 IMPRESSION: Stable probably benign postsurgical fluid collection/seroma within the right breast at the 11:30 o'clock axis, 6 cm from the nipple, measuring 2.3 x 0.9 x 1.2 cm. Recommend additional follow-up right breast diagnostic mammogram and ultrasound in 6 months to ensure continued stability.    03/21/2017 Mammogram   R breast Mammogram and [place]  Mammographic images were processed with CAD.   Targeted ultrasound is performed, showing slightly smaller mixed echogenicity collection within the right breast at 11:30 o'clock 6 cm from nipple measuring 0.9 x 0.7 x 1.4 cm. This is probably a postsurgical seroma.    07/31/2017 Mammogram   IMPRESSION: 1. Indeterminate 9 mm group of fine pleomorphic calcifications involving the upper outer quadrant of the right breast at posterior depth. 2. New partially obscured mass involving the inner left breast at anterior posterior depth.    08/07/2017 Breast US   Targeted right breast ultrasound is performed, showing the previously identified mildly complex fluid collection at the lumpectomy site at the 11:30 o'clock approximately 6 cm from the nipple has decreased in size in the interval, currently measuring  6 x 8 x 8 mm (previously 7 x 9 by 14 mm). No new suspicious solid mass or abnormal acoustic shadowing is identified.   Targeted left breast ultrasound is performed, showing that the new mammographic mass corresponds to an oval circumscribed parallel hypoechoic mass at the 10  o'clock position approximately 4 cm from the nipple measuring approximately 5 x 4 x 5 mm, demonstrating slight posterior acoustic enhancement and no internal power Doppler flow.   IMPRESSION: 1. Interval decrease in size of the postoperative seroma at the lumpectomy site in the upper outer quadrant of the right breast. 2. Likely benign 5 mm complex cyst in the upper inner quadrant of the left breast accounting for the new mammographic finding.    08/07/2017 Procedure   aspiration of the complex cyst in the upper inner quadrant of the left breast at the 10 o'clock position approximately 4 cm from the nipple. Less than 1 cc of cyst fluid was aspirated. The cyst completely collapsed with aspiration and there is no associated solid component.    08/07/2017 Pathology Results   Diagnosis Breast, left, needle core biopsy, UIQ at posterior depth - BENIGN BREAST TISSUE WITH CALCIFICATIONS. - NO MALIGNANCY IDENTIFIED. - SEE COMMENT.     03/10/2018 Mammogram    03/10/2018 Mammogram IMPRESSION: No mammographic evidence of malignancy involving the LEFT breast.       CURRENT THERAPY:  Surveillance   INTERVAL HISTORY: Jessica Roberts is here for a follow up of anemia and right breast cancer. She was last seen by me 3 months ago. She presents to the clinic alone. She recently received two infusions of IV iron on 06/15 and 06/22.She is scheduled for one more today. These have helped to improve her injury. The patient notes a bad night sleep last night with difficulty sleeping and nightmares.   She notes that her new doctor, Dr. Duaine Dredge, thinks that there must be some elements of GI bleeding and has thus changed her to Protonix and Metformin. He believes she has insulin-resistance. Since these changes, she notes some stomach discomfort intermittently that affected her sleep on two different occasions.     REVIEW OF SYSTEMS: Constitutional: Denies fevers, chills or abnormal weight  loss Eyes: Denies blurriness of vision Ears, nose, mouth, throat, and face: Denies mucositis or sore throat Respiratory: Denies cough, dyspnea or wheezes Cardiovascular: Denies palpitation, chest discomfort or lower extremity swelling Gastrointestinal:  Denies nausea, heartburn or change in bowel habits. Intermittent diarrhea that is not new. Skin: Denies abnormal skin rashes Lymphatics: Denies new lymphadenopathy or easy bruising Neurological:Denies numbness, tingling or new weaknesses Behavioral/Psych: Mood is stable, no new changes  All other systems were reviewed with the patient and are negative.  MEDICAL HISTORY:  Past Medical History:  Diagnosis Date   Allergy    Anxiety    Anxiety and depression    Breast cancer (HCC) 04/07/15   right breast   Breast cancer of upper-outer quadrant of right female breast (HCC) 04/11/2015   Broken nose    Complication of anesthesia    "had a very hard time waking up"   Dengue fever    Depression    Fall    broken nose   GERD (gastroesophageal reflux disease)    Head injury 1995   concussion in [place], med evac to [place], ear damage with vertigo   Head injury, acute, with loss of consciousness (HCC) 2000   [place] of the country   Head [place] closed, with concussion 1986  [place]   Headache    Hypertension    Malaria    PONV (postoperative nausea and vomiting)    Typhoid     SURGICAL HISTORY: Past Surgical History:  Procedure Laterality Date   BREAST LUMPECTOMY Right 04/27/2015   BREAST LUMPECTOMY WITH NEEDLE LOCALIZATION AND AXILLARY SENTINEL LYMPH NODE BX Right 04/27/2015   Procedure: RIGHT BREAST LUMPECTOMY WITH  TWO NEEDLE LOCALIZATION AND TWO RIGHT AXILLARY SENTINEL LYMPH NODE BX;  Surgeon: Fanny Skates, MD;  Location: [facility];  Service: General;  Laterality: Right;   COLONOSCOPY W/ BIOPSIES AND POLYPECTOMY     NERVE SURGERY     '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' during surgery for endometriosis   surgical procedure for  endometriosis     TONSILLECTOMY      I have reviewed the social history and family history with the patient and they are unchanged from previous note.  ALLERGIES:  is allergic to codeine, oxycontin [oxycodone hcl], percocet [oxycodone-acetaminophen], and percodan [oxycodone-aspirin].  MEDICATIONS:  Current Outpatient Medications  Medication Sig Dispense Refill   acetaminophen (TYLENOL) 500 MG tablet Take 500 mg by mouth 2 (two) times daily as needed for moderate pain (pain). Reported on 10/12/2015     albuterol (PROVENTIL HFA;VENTOLIN HFA) 108 (90 Base) MCG/ACT inhaler Inhale 1 puff into the lungs every 6 (six) hours as needed for wheezing or shortness of breath.     ANORO ELLIPTA 62.5-25 MCG/INH AEPB Inhale 1 puff into the lungs daily.     ARIPiprazole (ABILIFY) 2 MG tablet Take 2 mg by mouth daily.     aspirin EC 81 MG tablet Take 1 tablet (81 mg total) by mouth daily. Swallow whole. 90 tablet 3   atorvastatin (LIPITOR) 40 MG tablet Take 1 tablet (40 mg total) by mouth daily. 90 tablet 3   metroNIDAZOLE (METROGEL) 0.75 % gel metronidazole 0.75 % topical gel     sertraline (ZOLOFT) 100 MG tablet Take 100 mg by mouth daily.      No current facility-administered medications for this visit.   Facility-Administered Medications Ordered in Other Visits  Medication Dose Route Frequency Provider Last Rate Last Admin   diphenhydrAMINE (BENADRYL) capsule 25 mg  25 mg Oral Once Truitt Merle, MD       iron sucrose (VENOFER) 400 mg in sodium chloride 0.9 % 250 mL IVPB  400 mg Intravenous Once Truitt Merle, MD        PHYSICAL EXAMINATION: ECOG PERFORMANCE STATUS: 1 - Symptomatic but completely ambulatory  Vitals:   02/08/21 0905  BP: 138/78  Pulse: 82  Resp: 18  Temp: 97.8 F (36.6 C)  SpO2: 97%   Filed Weights   02/08/21 0905  Weight: 187 lb 6.4 oz (85 kg)     GENERAL:alert, no distress and comfortable SKIN: skin color, texture, turgor are normal, no rashes or significant lesions EYES:  normal, Conjunctiva are pink and non-injected, sclera clear  HEART: regular rate & rhythm and no murmurs and no lower extremity edema NEURO: alert & oriented x 3 with fluent speech, no focal motor/sensory deficits  No breast exam given today.  LABORATORY DATA:  I have reviewed the data as listed CBC Latest Ref Rng & Units 02/08/2021 12/28/2020 09/27/2020  WBC 4.0 - 10.5 K/uL 9.2 8.9 8.1  Hemoglobin 12.0 - 15.0 g/dL 11.0(L) 9.5(L) 13.1  Hematocrit 36.0 - 46.0 % 35.2(L) 30.9(L) 41.0  Platelets 150 - 400 K/uL 321 408(H) 319     CMP Latest Ref Rng & Units 12/28/2020 09/27/2020 08/23/2020  Glucose 70 - 99 mg/dL 104(H) 99 113(H)  BUN 8 - 23 mg/dL $Remove'18 14 11  'ljciVYJ$ Creatinine 0.44 - 1.00 mg/dL 0.72 0.86 0.77  Sodium 135 - 145 mmol/L 141 142 141  Potassium 3.5 - 5.1 mmol/L 4.7 4.2 4.6  Chloride 98 - 111 mmol/L 108 110 108  CO2 22 - 32 mmol/L $RemoveB'25 24 27  'mNJUJVby$ Calcium 8.9 - 10.3 mg/dL 9.2 9.3 9.4  Total Protein 6.5 - 8.1 g/dL 7.1 7.6 7.5  Total Bilirubin 0.3 - 1.2 mg/dL 0.4 0.3 0.3  Alkaline Phos 38 - 126 U/L 101 93 89  AST 15 - 41 U/L 12(L) 12(L) 12(L)  ALT 0 - 44 U/L $Remo'10 14 14      'gTYhK$ RADIOGRAPHIC STUDIES: I have personally reviewed the radiological images as listed and agreed with the findings in the report. No results found.   ASSESSMENT & PLAN:  Aeriel Boulay is a 75 y.o. female with    1. Right breast invasive ductal carcinoma, pT1bN0M0, stage IA, grade 1, ER positive, PR positive, HER-2 negative, (+) DCIS and ADH -Diagnosed in 2016. Treated with right lumpectomy and radiation. She declined antihormonal therapy. Currently on surveillance -From a breast cancer standpoint, She is clinically doing well. Lab reviewed, her CBC and CMP are within normal limits. Her physical exam and her 02/2020 mammogram were unremarkable. There is no clinical concern for recurrence. -Continue surveillance. Next mammogram in 02/2021. -F/u in 6 months    2. Iron deficient anemia -She has had anemia  intermittently since 2020. Pt denies overt bleeding. -She had colonoscopy and endoscopy (including small bowel) on 07/15/20 with Dr Michail Sermon. Per pt results were benign. Per patient results were benign and no bleeding with hiatal hernia and diverticulosis and gastritis and gallstones. -She is on Oral iron once daily. She was treated with IV Venofer $RemoveBe'400mg'JZMqhfDko$  on 07/26/20, 08/09/20, 08/15/20, 6/15, 6/22, and will get one more today. -Most recent labs show improvement in anemia and pt notes symptoms and fatigue have improved. -She has been encouraged to change her diet and continue to exercise. -Continue oral iron as tolerated. -Will monitor with lab every month    3. Depression -Currently on Zoloft, bupropion, and diazepam. -stable    4. Vitamin D Deficiency -Restart Vitamin D 1000 IU daily. The pt notes she has stopped, but will restart this. -will monitor closely      PLAN: -Will continue to monitor pt's blood counts for any potential IV iron needs. -Reviewed pt's blood counts today. Iron labs are still pending. Will proceed third dose venofer today  -Will check patient for Celiac's Disease. Will get celiac panel at next visit. -Will see back six months -Will schedule bone density scan.Will try to schedule same day as mammogram. -Will get labs qmonthly.     No problem-specific Assessment & Plan notes found for this encounter.   Orders Placed This Encounter  Procedures   DG Bone Density    Standing Status:   Future    Standing Expiration Date:   02/08/2022    Order Specific Question:   Reason for Exam (SYMPTOM  OR DIAGNOSIS REQUIRED)    Answer:   SCREENING    Order Specific Question:   Preferred imaging location?    Answer:   [facility]   Celiac panel 10    Standing Status:   Future    Standing Expiration Date:   02/08/2022   All questions were answered. The patient knows to call the clinic with any problems, questions or concerns.  No barriers to learning was detected. The  total time spent in the appointment was 30 minutes.     Truitt Merle, MD 02/08/2021   I, Reinaldo Raddle, am acting as scribe for Dr. Truitt Merle, MD.

## 2021-02-08 ENCOUNTER — Encounter: Payer: Self-pay | Admitting: Hematology

## 2021-02-08 ENCOUNTER — Inpatient Hospital Stay (HOSPITAL_BASED_OUTPATIENT_CLINIC_OR_DEPARTMENT_OTHER): Payer: Medicare Other | Admitting: Hematology

## 2021-02-08 ENCOUNTER — Inpatient Hospital Stay: Payer: Medicare Other

## 2021-02-08 ENCOUNTER — Other Ambulatory Visit: Payer: Self-pay

## 2021-02-08 VITALS — BP 138/78 | HR 82 | Temp 97.8°F | Resp 18 | Ht 66.0 in | Wt 187.4 lb

## 2021-02-08 VITALS — BP 123/45 | HR 66 | Temp 98.2°F | Resp 16

## 2021-02-08 DIAGNOSIS — Z170 Estrogen receptor positive status [ER+]: Secondary | ICD-10-CM

## 2021-02-08 DIAGNOSIS — E2839 Other primary ovarian failure: Secondary | ICD-10-CM

## 2021-02-08 DIAGNOSIS — D5 Iron deficiency 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 (chronic): Secondary | ICD-10-CM

## 2021-02-08 DIAGNOSIS — D509 Iron deficiency anemia, unspecified: Secondary | ICD-10-CM | POA: Diagnosis not present

## 2021-02-08 DIAGNOSIS — C50411 Malignant neoplasm of upper-outer quadrant of right female breast: Secondary | ICD-10-CM

## 2021-02-08 DIAGNOSIS — Z853 Personal history of malignant neoplasm of breast: Secondary | ICD-10-CM | POA: Diagnosis not present

## 2021-02-08 DIAGNOSIS — E559 Vitamin D deficiency, unspecified: Secondary | ICD-10-CM | POA: Diagnosis not present

## 2021-02-08 DIAGNOSIS — F32A Depression, unspecified: Secondary | ICD-10-CM | POA: Diagnosis not present

## 2021-02-08 LAB — CBC WITH DIFFERENTIAL (CANCER CENTER ONLY)
Abs Immature Granulocytes: 0.05 10*3/uL (ref 0.00–0.07)
Basophils Absolute: 0.1 10*3/uL (ref 0.0–0.1)
Basophils Relative: 1 %
Eosinophils Absolute: 0.4 10*3/uL (ref 0.0–0.5)
Eosinophils Relative: 4 %
HCT: 35.2 % — ABNORMAL LOW (ref 36.0–46.0)
Hemoglobin: 11 g/dL — ABNORMAL LOW (ref 12.0–15.0)
Immature Granulocytes: 1 %
Lymphocytes Relative: 20 %
Lymphs Abs: 1.8 10*3/uL (ref 0.7–4.0)
MCH: 27.1 pg (ref 26.0–34.0)
MCHC: 31.3 g/dL (ref 30.0–36.0)
MCV: 86.7 fL (ref 80.0–100.0)
Monocytes Absolute: 0.7 10*3/uL (ref 0.1–1.0)
Monocytes Relative: 8 %
Neutro Abs: 6.1 10*3/uL (ref 1.7–7.7)
Neutrophils Relative %: 66 %
Platelet Count: 321 10*3/uL (ref 150–400)
RBC: 4.06 MIL/uL (ref 3.87–5.11)
RDW: 17.4 % — ABNORMAL HIGH (ref 11.5–15.5)
WBC Count: 9.2 10*3/uL (ref 4.0–10.5)
nRBC: 0 % (ref 0.0–0.2)

## 2021-02-08 LAB — IRON AND TIBC
Iron: 45 ug/dL (ref 41–142)
Saturation Ratios: 13 % — ABNORMAL LOW (ref 21–57)
TIBC: 331 ug/dL (ref 236–444)
UIBC: 287 ug/dL (ref 120–384)

## 2021-02-08 LAB — FERRITIN: Ferritin: 193 ng/mL (ref 11–307)

## 2021-02-08 MED ORDER — SODIUM CHLORIDE 0.9 % IV SOLN
400.0000 mg | Freq: Once | INTRAVENOUS | Status: AC
  Administered 2021-02-08: 400 mg via INTRAVENOUS
  Filled 2021-02-08: qty 20

## 2021-02-08 MED ORDER — SODIUM CHLORIDE 0.9 % IV SOLN
Freq: Once | INTRAVENOUS | Status: AC
Start: 2021-02-08 — End: 2021-02-08
  Filled 2021-02-08: qty 250

## 2021-02-08 MED ORDER — DIPHENHYDRAMINE HCL 25 MG PO CAPS
25.0000 mg | ORAL_CAPSULE | Freq: Once | ORAL | Status: AC
  Administered 2021-02-08: 25 mg via ORAL

## 2021-02-08 MED ORDER — DIPHENHYDRAMINE HCL 25 MG PO CAPS
ORAL_CAPSULE | ORAL | Status: AC
  Filled 2021-02-08: qty 1

## 2021-02-08 NOTE — Progress Notes (Signed)
Per Dr. Burr Medico, proceed with IV iron today with ferritin being 193.  Patient was observed for 30 minutes post iron infusion with no adverse reactions. Vitals stable and patient with no complaints upon leaving infusion room.

## 2021-02-08 NOTE — Patient Instructions (Signed)

## 2021-02-21 DIAGNOSIS — R3915 Urgency of urination: Secondary | ICD-10-CM | POA: Diagnosis not present

## 2021-02-21 DIAGNOSIS — R350 Frequency of micturition: Secondary | ICD-10-CM | POA: Diagnosis not present

## 2021-02-28 ENCOUNTER — Ambulatory Visit
Admission: RE | Admit: 2021-02-28 | Discharge: 2021-02-28 | Disposition: A | Discharge status: Home / Self Care | Payer: Medicare Other | Source: Ambulatory Visit | Attending: Hematology | Admitting: Hematology

## 2021-02-28 ENCOUNTER — Other Ambulatory Visit: Payer: Self-pay

## 2021-02-28 ENCOUNTER — Other Ambulatory Visit: Payer: Self-pay | Admitting: Hematology

## 2021-02-28 DIAGNOSIS — C50411 Malignant neoplasm of upper-outer quadrant of right female breast: Secondary | ICD-10-CM

## 2021-02-28 DIAGNOSIS — Z170 Estrogen receptor positive status [ER+]: Secondary | ICD-10-CM

## 2021-02-28 DIAGNOSIS — Z1231 Encounter for screening mammogram for malignant neoplasm of breast: Secondary | ICD-10-CM | POA: Diagnosis not present

## 2021-03-01 ENCOUNTER — Inpatient Hospital Stay: Payer: Medicare Other

## 2021-03-02 DIAGNOSIS — R3915 Urgency of urination: Secondary | ICD-10-CM | POA: Diagnosis not present

## 2021-03-02 DIAGNOSIS — D5 Iron deficiency 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 (chronic): Secondary | ICD-10-CM | POA: Diagnosis not present

## 2021-03-02 DIAGNOSIS — R350 Frequency of micturition: Secondary | ICD-10-CM | POA: Diagnosis not present

## 2021-03-08 ENCOUNTER — Inpatient Hospital Stay: Payer: Medicare Other | Attending: Nurse Practitioner

## 2021-04-05 ENCOUNTER — Inpatient Hospital Stay: Payer: Medicare Other | Attending: Nurse Practitioner

## 2021-04-28 ENCOUNTER — Ambulatory Visit: Payer: Medicare Other | Admitting: Internal Medicine

## 2021-05-03 ENCOUNTER — Other Ambulatory Visit: Payer: Medicare Other

## 2021-05-03 ENCOUNTER — Ambulatory Visit: Payer: Medicare Other | Admitting: Hematology

## 2021-05-10 ENCOUNTER — Inpatient Hospital Stay: Payer: Medicare Other | Attending: Nurse Practitioner

## 2021-05-18 DIAGNOSIS — Z85828 Personal history of other malignant neoplasm of skin: Secondary | ICD-10-CM | POA: Diagnosis not present

## 2021-05-18 DIAGNOSIS — L570 Actinic keratosis: Secondary | ICD-10-CM | POA: Diagnosis not present

## 2021-05-18 DIAGNOSIS — D1801 Hemangioma of skin and subcutaneous tissue: Secondary | ICD-10-CM | POA: Diagnosis not present

## 2021-05-18 DIAGNOSIS — L821 Other seborrheic keratosis: Secondary | ICD-10-CM | POA: Diagnosis not present

## 2021-05-24 DIAGNOSIS — R7302 Impaired glucose tolerance (oral): Secondary | ICD-10-CM | POA: Diagnosis not present

## 2021-05-24 DIAGNOSIS — E782 Mixed hyperlipidemia: Secondary | ICD-10-CM | POA: Diagnosis not present

## 2021-05-24 DIAGNOSIS — D5 Iron deficiency 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 (chronic): Secondary | ICD-10-CM | POA: Diagnosis not present

## 2021-05-24 DIAGNOSIS — R3915 Urgency of urination: Secondary | ICD-10-CM | POA: Diagnosis not present

## 2021-05-25 ENCOUNTER — Telehealth: Payer: Self-pay | Admitting: Hematology

## 2021-05-25 NOTE — Telephone Encounter (Signed)
Reschedule per provider sch change, message was left with pt

## 2021-06-07 ENCOUNTER — Inpatient Hospital Stay: Payer: Medicare Other | Attending: Nurse Practitioner

## 2021-07-03 DIAGNOSIS — R109 Unspecified abdominal pain: Secondary | ICD-10-CM | POA: Diagnosis not present

## 2021-07-12 ENCOUNTER — Inpatient Hospital Stay: Payer: Medicare Other | Attending: Nurse Practitioner

## 2021-07-28 DIAGNOSIS — L819 Disorder of pigmentation, unspecified: Secondary | ICD-10-CM | POA: Diagnosis not present

## 2021-07-28 DIAGNOSIS — L03119 Cellulitis of unspecified part of limb: Secondary | ICD-10-CM | POA: Diagnosis not present

## 2021-07-31 DIAGNOSIS — K449 Diaphragmatic hernia without obstruction or gangrene: Secondary | ICD-10-CM | POA: Diagnosis not present

## 2021-07-31 DIAGNOSIS — N281 Cyst of kidney, acquired: Secondary | ICD-10-CM | POA: Diagnosis not present

## 2021-07-31 DIAGNOSIS — K7689 Other specified diseases of liver: Secondary | ICD-10-CM | POA: Diagnosis not present

## 2021-07-31 DIAGNOSIS — K8020 Calculus of gallbladder without cholecystitis without obstruction: Secondary | ICD-10-CM | POA: Diagnosis not present

## 2021-08-01 DIAGNOSIS — E782 Mixed hyperlipidemia: Secondary | ICD-10-CM | POA: Diagnosis not present

## 2021-08-08 ENCOUNTER — Other Ambulatory Visit: Payer: Self-pay

## 2021-08-08 DIAGNOSIS — D509 Iron deficiency anemia, unspecified: Secondary | ICD-10-CM

## 2021-08-09 ENCOUNTER — Other Ambulatory Visit: Payer: Medicare Other

## 2021-08-09 ENCOUNTER — Inpatient Hospital Stay: Payer: Medicare Other | Attending: Nurse Practitioner

## 2021-08-09 ENCOUNTER — Inpatient Hospital Stay: Payer: Medicare Other | Admitting: Hematology

## 2021-08-09 ENCOUNTER — Ambulatory Visit: Payer: Medicare Other | Admitting: Hematology

## 2021-08-22 DIAGNOSIS — R109 Unspecified abdominal pain: Secondary | ICD-10-CM | POA: Diagnosis not present

## 2021-08-23 ENCOUNTER — Other Ambulatory Visit: Payer: Self-pay | Admitting: Family Medicine

## 2021-08-23 DIAGNOSIS — R1032 Left lower quadrant pain: Secondary | ICD-10-CM | POA: Diagnosis not present

## 2021-08-23 DIAGNOSIS — R109 Unspecified abdominal pain: Secondary | ICD-10-CM | POA: Diagnosis not present

## 2021-08-23 DIAGNOSIS — R509 Fever, unspecified: Secondary | ICD-10-CM

## 2021-08-23 DIAGNOSIS — D72829 Elevated white blood cell count, unspecified: Secondary | ICD-10-CM

## 2021-08-23 DIAGNOSIS — K2970 Gastritis, unspecified, without bleeding: Secondary | ICD-10-CM | POA: Diagnosis not present

## 2021-08-23 DIAGNOSIS — K5730 Diverticulosis of large intestine without perforation or abscess without bleeding: Secondary | ICD-10-CM | POA: Diagnosis not present

## 2021-08-24 ENCOUNTER — Ambulatory Visit
Admission: RE | Admit: 2021-08-24 | Discharge: 2021-08-24 | Disposition: A | Discharge status: Home / Self Care | Payer: Medicare Other | Source: Ambulatory Visit | Attending: Family Medicine | Admitting: Family Medicine

## 2021-08-24 DIAGNOSIS — K8020 Calculus of gallbladder without cholecystitis without obstruction: Secondary | ICD-10-CM | POA: Diagnosis not present

## 2021-08-24 DIAGNOSIS — K7689 Other specified diseases of liver: Secondary | ICD-10-CM | POA: Diagnosis not present

## 2021-08-24 DIAGNOSIS — D72829 Elevated white blood cell count, unspecified: Secondary | ICD-10-CM

## 2021-08-24 DIAGNOSIS — N281 Cyst of kidney, acquired: Secondary | ICD-10-CM | POA: Diagnosis not present

## 2021-08-24 DIAGNOSIS — R1032 Left lower quadrant pain: Secondary | ICD-10-CM

## 2021-08-24 DIAGNOSIS — K449 Diaphragmatic hernia without obstruction or gangrene: Secondary | ICD-10-CM | POA: Diagnosis not present

## 2021-08-24 DIAGNOSIS — R509 Fever, unspecified: Secondary | ICD-10-CM

## 2021-08-24 MED ORDER — IOPAMIDOL (ISOVUE-300) INJECTION 61%
100.0000 mL | Freq: Once | INTRAVENOUS | Status: AC | PRN
  Administered 2021-08-24: 100 mL via INTRAVENOUS

## 2021-09-06 DIAGNOSIS — L820 Inflamed seborrheic keratosis: Secondary | ICD-10-CM | POA: Diagnosis not present

## 2021-09-06 DIAGNOSIS — L821 Other seborrheic keratosis: Secondary | ICD-10-CM | POA: Diagnosis not present

## 2021-09-06 DIAGNOSIS — D485 Neoplasm of uncertain behavior of skin: Secondary | ICD-10-CM | POA: Diagnosis not present

## 2021-09-06 DIAGNOSIS — Z85828 Personal history of other malignant neoplasm of skin: Secondary | ICD-10-CM | POA: Diagnosis not present

## 2021-09-06 DIAGNOSIS — L570 Actinic keratosis: Secondary | ICD-10-CM | POA: Diagnosis not present

## 2021-09-12 DIAGNOSIS — K5732 Diverticulitis of large intestine without perforation or abscess without bleeding: Secondary | ICD-10-CM | POA: Diagnosis not present

## 2021-09-12 DIAGNOSIS — G43109 Migraine with aura, not intractable, without status migrainosus: Secondary | ICD-10-CM | POA: Diagnosis not present

## 2021-10-11 DIAGNOSIS — R1030 Lower abdominal pain, unspecified: Secondary | ICD-10-CM | POA: Diagnosis not present

## 2021-10-20 DIAGNOSIS — D5 Iron deficiency 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 (chronic): Secondary | ICD-10-CM | POA: Diagnosis not present

## 2021-10-20 DIAGNOSIS — G2571 Drug induced akathisia: Secondary | ICD-10-CM | POA: Diagnosis not present

## 2021-10-20 DIAGNOSIS — K5732 Diverticulitis of large intestine without perforation or abscess without bleeding: Secondary | ICD-10-CM | POA: Diagnosis not present

## 2021-10-23 DIAGNOSIS — K7689 Other specified diseases of liver: Secondary | ICD-10-CM | POA: Diagnosis not present

## 2021-10-23 DIAGNOSIS — K8020 Calculus of gallbladder without cholecystitis without obstruction: Secondary | ICD-10-CM | POA: Diagnosis not present

## 2021-10-23 DIAGNOSIS — K449 Diaphragmatic hernia without obstruction or gangrene: Secondary | ICD-10-CM | POA: Diagnosis not present

## 2021-10-23 DIAGNOSIS — N281 Cyst of kidney, acquired: Secondary | ICD-10-CM | POA: Diagnosis not present

## 2021-11-21 DIAGNOSIS — Z85828 Personal history of other malignant neoplasm of skin: Secondary | ICD-10-CM | POA: Diagnosis not present

## 2021-11-21 DIAGNOSIS — L812 Freckles: Secondary | ICD-10-CM | POA: Diagnosis not present

## 2021-11-21 DIAGNOSIS — L821 Other seborrheic keratosis: Secondary | ICD-10-CM | POA: Diagnosis not present

## 2021-11-21 DIAGNOSIS — L570 Actinic keratosis: Secondary | ICD-10-CM | POA: Diagnosis not present

## 2021-11-21 DIAGNOSIS — C44629 Squamous cell carcinoma of skin of left upper limb, including shoulder: Secondary | ICD-10-CM | POA: Diagnosis not present

## 2021-11-27 ENCOUNTER — Encounter (INDEPENDENT_AMBULATORY_CARE_PROVIDER_SITE_OTHER): Payer: Medicare Other | Admitting: Ophthalmology

## 2021-11-27 ENCOUNTER — Ambulatory Visit (INDEPENDENT_AMBULATORY_CARE_PROVIDER_SITE_OTHER): Payer: Medicare Other | Admitting: Ophthalmology

## 2021-11-27 DIAGNOSIS — H2513 Age-related nuclear cataract, bilateral: Secondary | ICD-10-CM | POA: Diagnosis not present

## 2021-11-27 DIAGNOSIS — H353131 Nonexudative age-related macular degeneration, bilateral, early dry stage: Secondary | ICD-10-CM | POA: Diagnosis not present

## 2021-11-27 DIAGNOSIS — D3131 Benign neoplasm of right choroid: Secondary | ICD-10-CM | POA: Insufficient documentation

## 2021-11-27 NOTE — Assessment & Plan Note (Addendum)
The nature of age--related macular degeneration was discussed with the patient as well as the distinction between dry and wet types. Checking an Amsler Grid daily with advice to return immediately should a distortion develop, was given to the patient. The patient 's smoking status now and in the past was determined and advice based on the AREDS study was provided regarding the consumption of antioxidant supplements. AREDS 2 vitamin formulation was recommended. Consumption of dark leafy vegetables and fresh fruits of various colors was recommended. Treatment modalities for wet macular degeneration particularly the use of intravitreal injections of anti-blood vessel growth factors was discussed with the patient. Avastin, Lucentis, and Eylea are the available options. On occasion, therapy includes the use of photodynamic therapy and thermal laser. Stressed to the patient do not rub eyes.  Patient was advised to check Amsler Grid daily and return immediately if changes are noted. Instructions on using the grid were given to the patient. All patient questions were answered. ? ?Early OU especially OS, does not require intervention or any kind including vitamin therapy at this stage ?

## 2021-11-27 NOTE — Assessment & Plan Note (Signed)
Follow-up today, no interval exams, no interval change in choroidal nevus temporal portion of macula no high risk features ? ?We will continue to monitor ? ?Patient understands critical importance of continued monitoring to confirm no interval growth changes as it is rare to happen but can ?

## 2021-11-27 NOTE — Assessment & Plan Note (Signed)
Patient has asthenopia symptoms with vague sense of heaviness and achiness in particular in the right eye likely related to progressive Industry changes ? ?Will recommend refraction full evaluation possible cataract evaluation. ? ?I will explained the patient that her lenses in her eyes are like wearing dark sunglasses.  Such that her likely symptoms are difficulty with driving at night and/or driving on dark rainy days. ? ?Upon review of systems I in fact confirmed that these are her symptoms ? ?While with good acuity corrected these difficulties impact her activities of daily living thus we recommend evaluation for possible cataract surgery ?

## 2021-11-27 NOTE — Progress Notes (Signed)
? ? ?11/27/2021 ? ?  ? ?CHIEF COMPLAINT ?Patient presents for  ?Chief Complaint  ?Patient presents with  ? Retina Evaluation  ? ? ? ? ?HISTORY OF PRESENT ILLNESS: ?Jessica Roberts is a 76 y.o. female who presents to the clinic today for:  ? ?HPI   ? ? Retina Evaluation   ? ?      ? Laterality: right eye  ? Associated Symptoms: Flashes, Floaters and Pain.  Negative for Distortion, Blind Spot, Redness, Photophobia, Glare, Trauma, Scalp Tenderness, Jaw Claudication, Shoulder/Hip pain, Fever, Weight Loss and Fatigue  ? ?  ?  ? ? Comments   ?NP- Last seen 2016- FU OCT OU- Some pain OD ?Pt stated, "it feels like we are mashing too hard on on my right eye. I have auroras in my right eye and they're red. It started last month."  ?Pt stated overdue for eye exam. Pt is having blurry vision. ?Pt is seeing floaters and red FOL. ? ? ? ? ?  ?  ?Last edited by Silvestre Moment on 11/27/2021 10:01 AM.  ?  ? ? ?Referring physician: ?Derinda Late, MD ?[address] ?[place],  [place] [id number] ? ?HISTORICAL INFORMATION:  ? ?Selected notes from the [facility] ?  ?   ? ?CURRENT MEDICATIONS: ?No current outpatient medications on file. (Ophthalmic Drugs)  ? ?No current facility-administered medications for this visit. (Ophthalmic Drugs)  ? ?Current Outpatient Medications (Other)  ?Medication Sig  ? acetaminophen (TYLENOL) 500 MG tablet Take 500 mg by mouth 2 (two) times daily as needed for moderate pain (pain). Reported on 10/12/2015  ? albuterol (PROVENTIL HFA;VENTOLIN HFA) 108 (90 Base) MCG/ACT inhaler Inhale 1 puff into the lungs every 6 (six) hours as needed for wheezing or shortness of breath.  ? ANORO ELLIPTA 62.5-25 MCG/INH AEPB Inhale 1 puff into the lungs daily.  ? ARIPiprazole (ABILIFY) 2 MG tablet Take 2 mg by mouth daily.  ? aspirin EC 81 MG tablet Take 1 tablet (81 mg total) by mouth daily. Swallow whole.  ? atorvastatin (LIPITOR) 40 MG tablet Take 1 tablet (40 mg total) by mouth daily.  ? metroNIDAZOLE (METROGEL) 0.75  % gel metronidazole 0.75 % topical gel  ? sertraline (ZOLOFT) 100 MG tablet Take 100 mg by mouth daily.   ? ?No current facility-administered medications for this visit. (Other)  ? ? ? ? ?REVIEW OF SYSTEMS: ? ? ? ?ALLERGIES ?Allergies  ?Allergen Reactions  ? Codeine Other (See Comments)  ?  Nervous system dysfunction, cannot control or use limbs  ? Oxycontin [Oxycodone Hcl] Other (See Comments)  ? Percocet [Oxycodone-Acetaminophen] Nausea And Vomiting  ? Percodan [Oxycodone-Aspirin] Nausea And Vomiting  ? ? ?PAST MEDICAL HISTORY ?Past Medical History:  ?Diagnosis Date  ? Allergy   ? Anxiety   ? Anxiety and depression   ? Breast cancer ([facility]) 04/07/15  ? right breast  ? Breast cancer of upper-outer quadrant of right female breast ([facility]) 04/11/2015  ? Broken nose   ? Complication of anesthesia   ? "had a very hard time waking up"  ? Dengue fever   ? Depression   ? Fall   ? broken nose  ? GERD (gastroesophageal reflux disease)   ? Head injury 1995  ? concussion in [place], med evac to [place], ear damage with vertigo  ? Head injury, acute, with loss of consciousness ([place]) 2000  ? out of the country  ? Head injury, closed, with concussion 1986  ? in [place]  ? Headache   ?  Hypertension   ? Malaria   ? PONV (postoperative nausea and vomiting)   ? Typhoid   ? ?Past Surgical History:  ?Procedure Laterality Date  ? BREAST LUMPECTOMY Right 04/27/2015  ? BREAST LUMPECTOMY WITH NEEDLE LOCALIZATION AND AXILLARY SENTINEL LYMPH NODE BX Right 04/27/2015  ? Procedure: RIGHT BREAST LUMPECTOMY WITH  TWO NEEDLE LOCALIZATION AND TWO RIGHT AXILLARY SENTINEL LYMPH NODE BX;  Surgeon: Fanny Skates, MD;  Location: [facility];  Service: General;  Laterality: Right;  ? COLONOSCOPY W/ BIOPSIES AND POLYPECTOMY    ? NERVE SURGERY    ? '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' during surgery for endometriosis  ? surgical procedure for endometriosis    ? TONSILLECTOMY    ? ? ?FAMILY HISTORY ?Family History  ?Problem Relation Age of Onset  ? Hypertension Mother   ?  Thyroid disease Mother   ? Breast cancer Mother   ? Emphysema Father   ? Diabetes Maternal Grandmother   ? Heart disease Paternal Grandmother   ? Alcohol abuse Paternal Grandfather   ? ? ?SOCIAL HISTORY ?Social History  ? ?Tobacco Use  ? Smoking status: Former  ?  Packs/day: 1.50  ?  Years: 30.00  ?  Pack years: 45.00  ?  Types: Cigarettes  ?  Quit date: 01/11/1994  ?  Years since quitting: 27.8  ? Smokeless tobacco: Never  ?Substance Use Topics  ? Alcohol use: Yes  ?  Alcohol/week: 4.0 standard drinks  ?  Types: 4 Glasses of wine per week  ?  Comment: moderate   ? Drug use: No  ? ?  ? ?  ? ?OPHTHALMIC EXAM: ? ?Base Eye Exam   ? ? Visual Acuity (ETDRS)   ? ?   Right Left  ? Dist cc 20/25 -2 20/30 +2  ? ? Correction: Glasses  ? ?  ?  ? ? Tonometry (Tonopen, 10:09 AM)   ? ?   Right Left  ? Pressure 14 12  ? ?  ?  ? ? Pupils   ? ?   Pupils Dark Light Shape React APD  ? Right PERRL 4 3 Round Brisk None  ? Left PERRL 4 3 Round Brisk None  ? ?  ?  ? ? Visual Fields   ? ?   Left Right  ?  Full Full  ? ?  ?  ? ? Extraocular Movement   ? ?   Right Left  ?  Full Full  ? ?  ?  ? ? Dilation   ? ? Both eyes: 1.0% Mydriacyl, 2.5% Phenylephrine @ 10:09 AM  ? ?  ?  ? ?  ? ?Slit Lamp and Fundus Exam   ? ? External Exam   ? ?   Right Left  ? External Normal Normal  ? ?  ?  ? ? Slit Lamp Exam   ? ?   Right Left  ? Lids/Lashes Normal Normal  ? Conjunctiva/Sclera White and quiet White and quiet  ? Cornea Clear Clear  ? Anterior Chamber Deep and quiet Deep and quiet  ? Iris Round and reactive Round and reactive  ? Lens Cortical spokes, 2+ Nuclear sclerosis Cortical spokes, 2+ Nuclear sclerosis  ? Anterior Vitreous Normal Normal  ? ?  ?  ? ? Fundus Exam   ? ?   Right Left  ? Posterior Vitreous Posterior vitreous detachment Posterior vitreous detachment  ? Disc Normal Normal  ? C/D Ratio 0.55 0.55  ? Macula Early age related macular degeneration  Early age related macular degeneration  ? Vessels Normal Normal  ? Periphery Choroidal nevus  temporal portion of macula right eye, no interval change for 7 years.  No high risk features flat, 1.5-2 disc diameters, no SRF no lipofuscin, no fluid Normal  ? ?  ?  ? ?  ? ? ?IMAGING AND PROCEDURES  ?Imaging and Procedures for 11/27/21 ? ?OCT, Retina - OU - Both Eyes   ? ?   ?Right Eye ?Quality was good. Scan locations included subfoveal. Central Foveal Thickness: 284. Progression has been stable. Findings include normal foveal contour.  ? ?Left Eye ?Quality was good. Scan locations included subfoveal. Central Foveal Thickness: 278. Progression has been stable. Findings include normal foveal contour, retinal drusen .  ? ?Notes ?Rare drusen OS only ? ?  ? ?Color Fundus Photography Optos - OU - Both Eyes   ? ?   ?Right Eye ?Progression has been stable. Disc findings include normal observations. Vessels : normal observations. Periphery : normal observations.  ? ?Left Eye ?Progression has been stable. Macula : drusen. Vessels : normal observations. Periphery : normal observations.  ? ?Notes ?Vitreous debris noted centrally central vitreous floaters ? ?Solitary very small drusen macula left eye ? ?OD macular choroidal nevus no change over time continue to observe ? ? ? ?  ? ? ?  ?  ? ?  ?ASSESSMENT/PLAN: ? ?Choroidal nevus, right ?Follow-up today, no interval exams, no interval change in choroidal nevus temporal portion of macula no high risk features ? ?We will continue to monitor ? ?Patient understands critical importance of continued monitoring to confirm no interval growth changes as it is rare to happen but can ? ?Early stage nonexudative age-related macular degeneration of both eyes ?The nature of age--related macular degeneration was discussed with the patient as well as the distinction between dry and wet types. Checking an Amsler Grid daily with advice to return immediately should a distortion develop, was given to the patient. The patient 's smoking status now and in the past was determined and advice based  on the AREDS study was provided regarding the consumption of antioxidant supplements. AREDS 2 vitamin formulation was recommended. Consumption of dark leafy vegetables and fresh fruits of various colors

## 2021-11-28 DIAGNOSIS — C44629 Squamous cell carcinoma of skin of left upper limb, including shoulder: Secondary | ICD-10-CM | POA: Diagnosis not present

## 2021-11-28 DIAGNOSIS — Z85828 Personal history of other malignant neoplasm of skin: Secondary | ICD-10-CM | POA: Diagnosis not present

## 2021-11-30 DIAGNOSIS — D5 Iron deficiency 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 (chronic): Secondary | ICD-10-CM | POA: Diagnosis not present

## 2021-12-05 ENCOUNTER — Inpatient Hospital Stay: Payer: Medicare Other

## 2021-12-05 ENCOUNTER — Other Ambulatory Visit: Payer: Self-pay | Admitting: Family Medicine

## 2021-12-05 ENCOUNTER — Inpatient Hospital Stay: Payer: Medicare Other | Admitting: Hematology

## 2021-12-05 DIAGNOSIS — Z03818 Encounter for observation for suspected exposure to other biological agents ruled out: Secondary | ICD-10-CM | POA: Diagnosis not present

## 2021-12-05 DIAGNOSIS — Z1159 Encounter for screening for other viral diseases: Secondary | ICD-10-CM | POA: Diagnosis not present

## 2021-12-05 DIAGNOSIS — Z20828 Contact with and (suspected) exposure to other viral communicable diseases: Secondary | ICD-10-CM | POA: Diagnosis not present

## 2021-12-05 DIAGNOSIS — U071 COVID-19: Secondary | ICD-10-CM | POA: Diagnosis not present

## 2021-12-06 DIAGNOSIS — J440 Chronic obstructive pulmonary disease with acute lower respiratory infection: Secondary | ICD-10-CM | POA: Diagnosis not present

## 2021-12-06 LAB — SARS CORONAVIRUS 2 (TAT 6-24 HRS): SARS Coronavirus 2: NEGATIVE

## 2021-12-07 IMAGING — MG DIGITAL DIAGNOSTIC BILAT W/ TOMO W/ CAD
6 of 9 series · 6 of 25 positions shown · non-contrast
Comparison: Previous exam(s).

CLINICAL DATA: 73-year-old female status post malignant right
lumpectomy in 1487 with approximately 3 months intermittent pain
along the lumpectomy scar.

EXAM:
DIGITAL DIAGNOSTIC BILATERAL MAMMOGRAM WITH CAD AND TOMO
ULTRASOUND RIGHT BREAST

[R MLO]
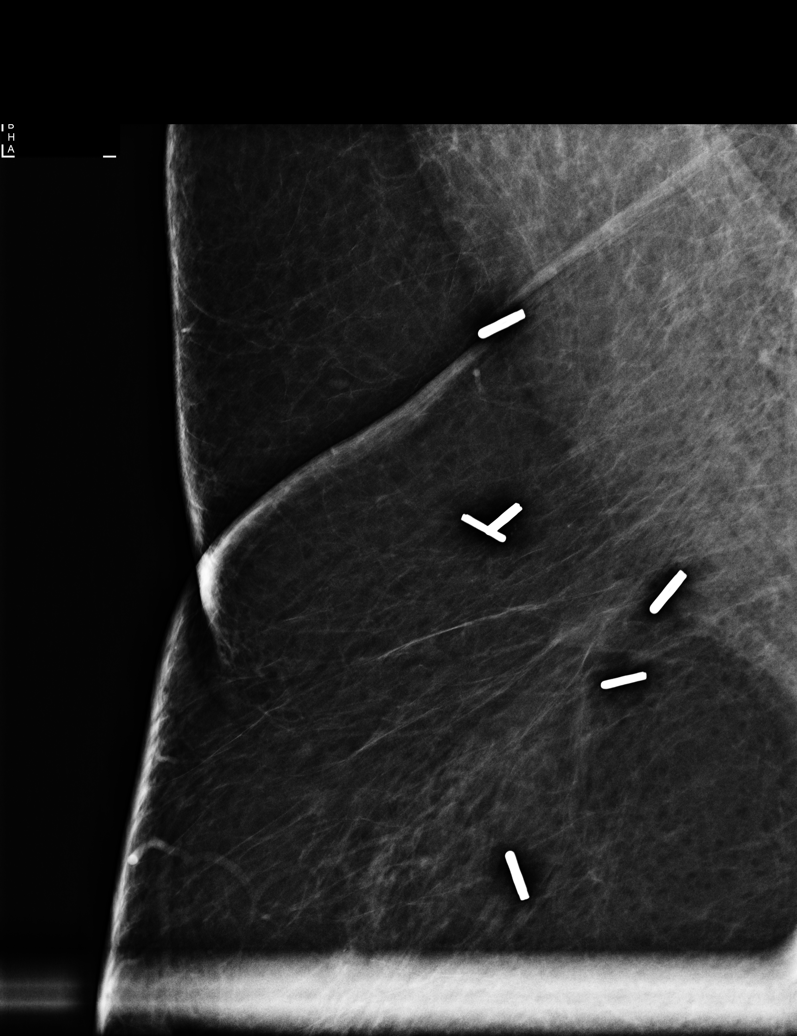

[R CC synth-2D]
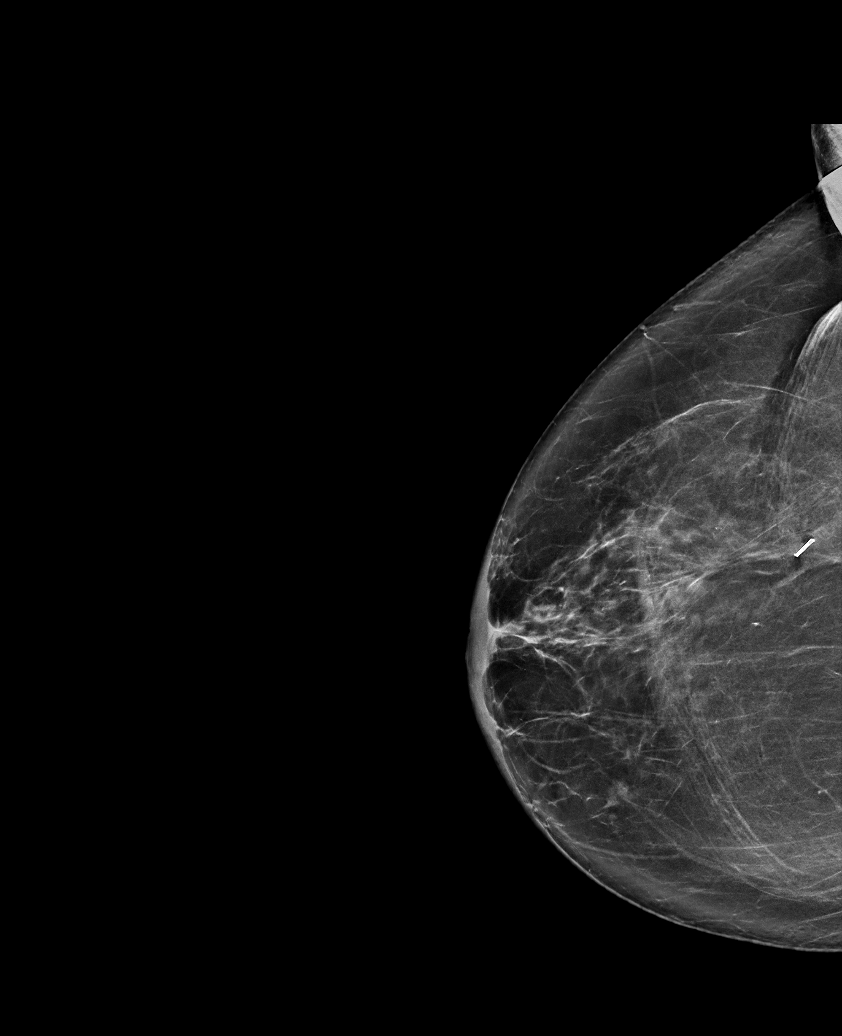

[L CC synth-2D]
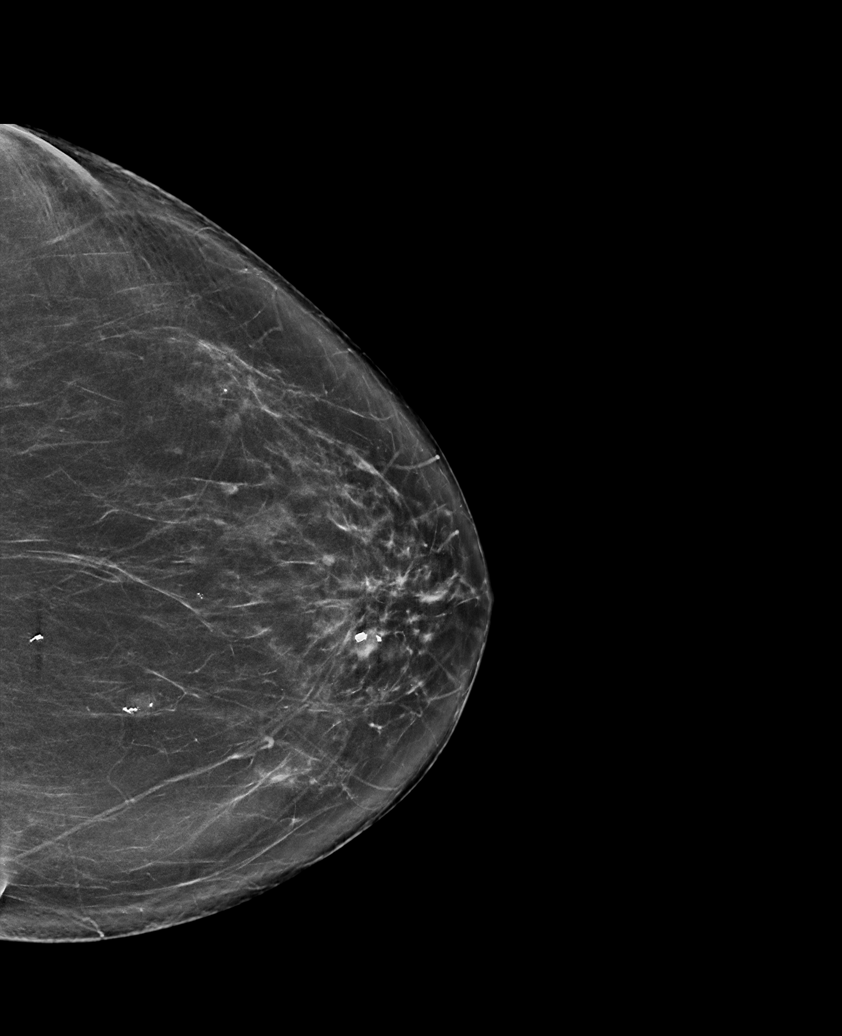

[R MLO synth-2D]
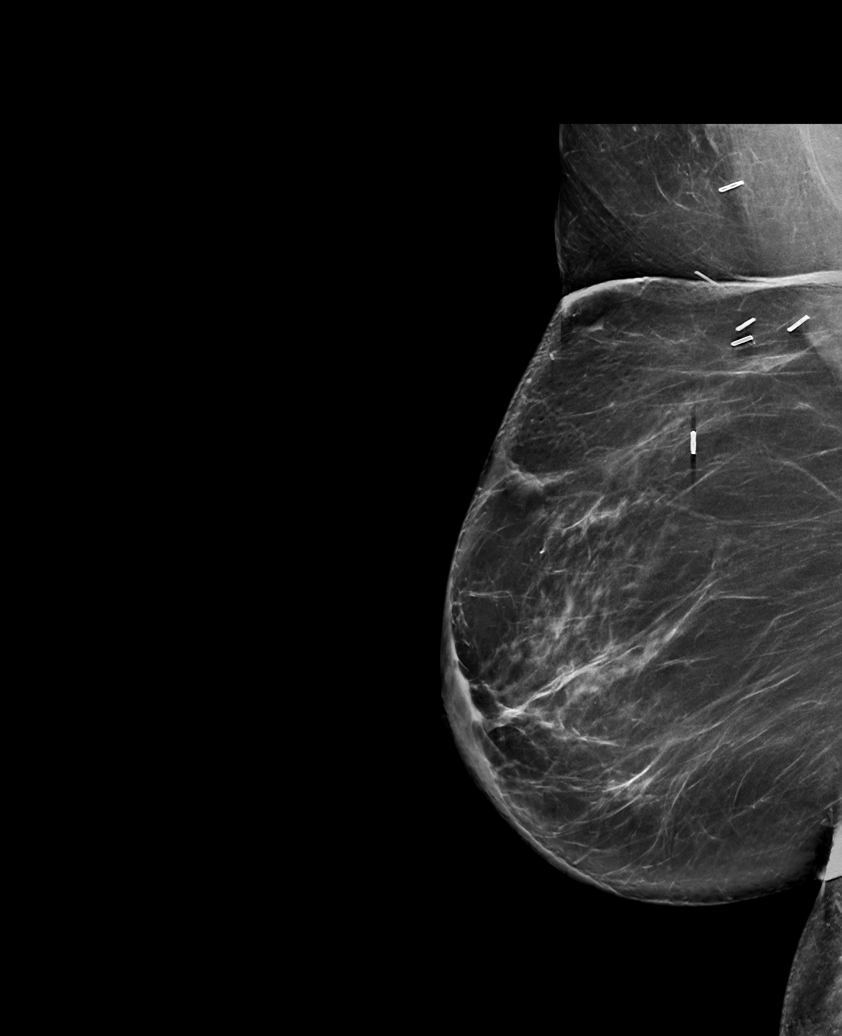

[L MLO synth-2D]
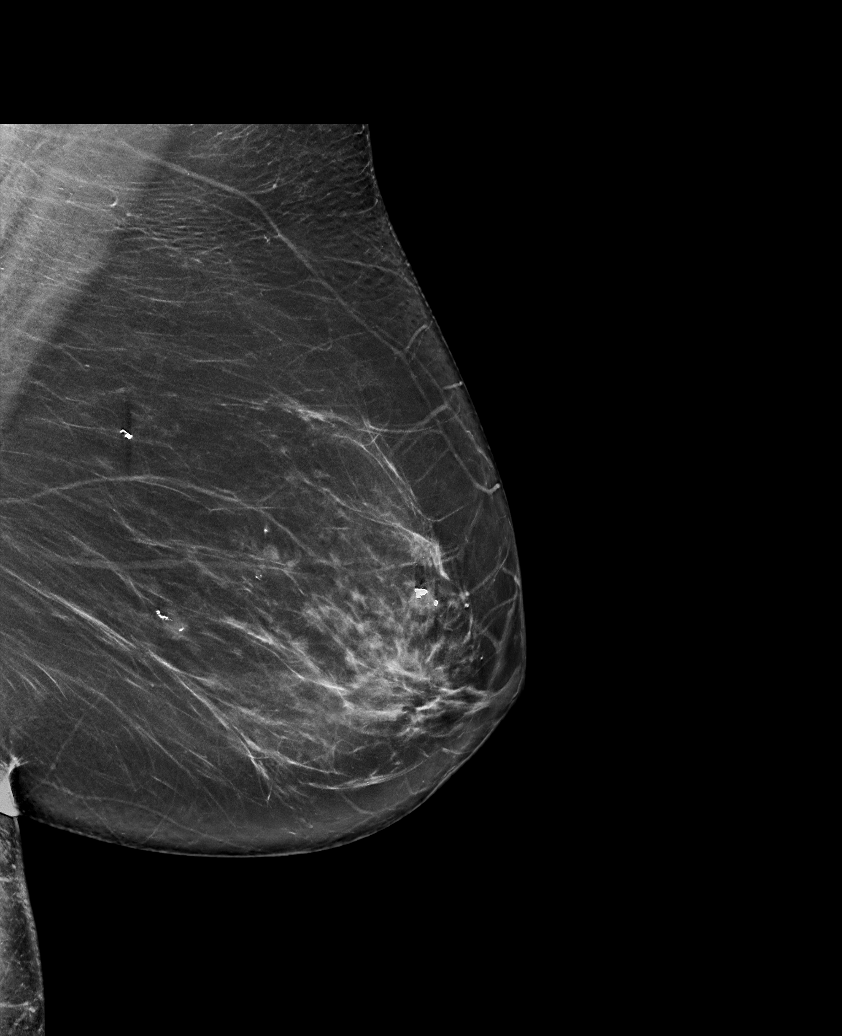

[R MLO tomo · tomo slice 43/84.0]
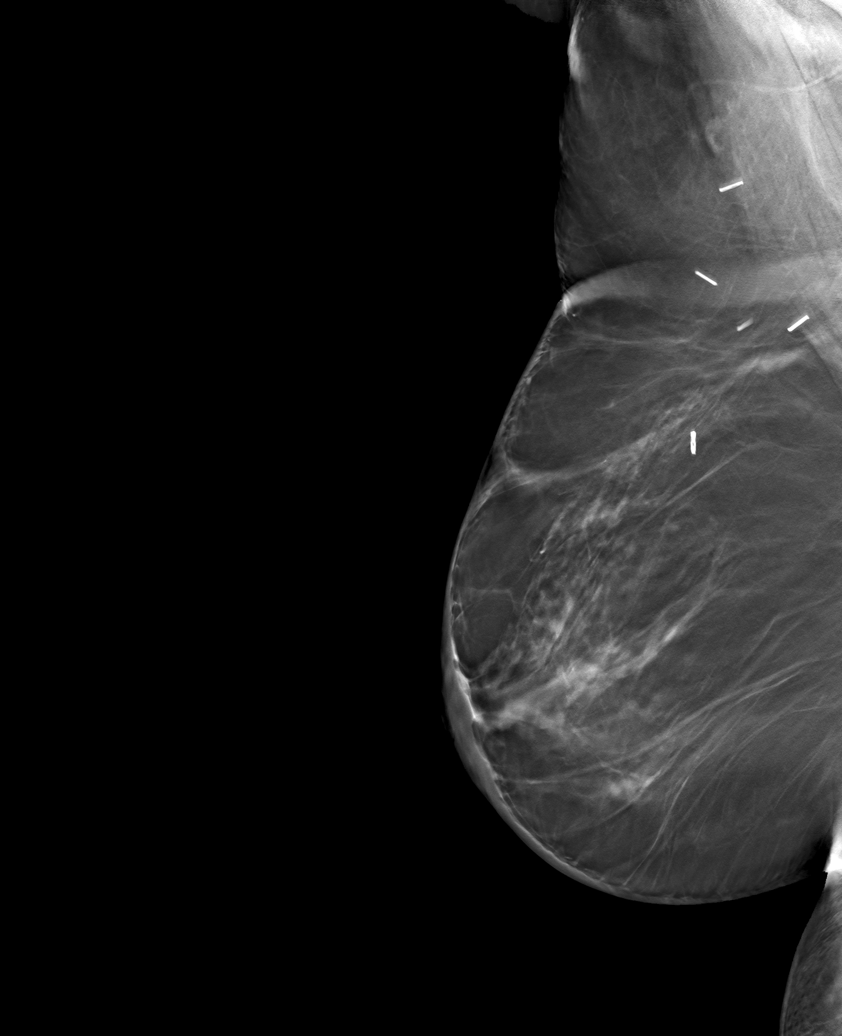

[6 of 25 positions shown; findings below may reference images not displayed]

ACR Breast Density Category b: There are scattered areas of
fibroglandular density.
FINDINGS: Stable postsurgical changes are noted in the superior central right
breast posteriorly. No new or suspicious findings are identified in
the remainder of either breast. The parenchymal pattern is stable.

Mammographic images were processed with CAD.

Targeted ultrasound is performed, showing normal fibroglandular
tissue without focal or suspicious sonographic abnormality.
Evaluation of the upper outer right breast was performed at the site
of the patient's focal pain.
IMPRESSION: 1. No mammographic evidence of malignancy in either breast.
2. Stable right breast posttreatment changes without suspicious
sonographic findings at the site of the patient's focal intermittent
pain.

RECOMMENDATION:
1. Clinical follow-up recommended for the intermittently painful
area of concern in the right breast. Any further workup should be
based on clinical grounds.
2.  Screening mammogram in one year.(Code:CK-W-I3F)

I have discussed the findings and recommendations with the patient.
If applicable, a reminder letter will be sent to the patient
regarding the next appointment.

BI-RADS CATEGORY  2: Benign.

## 2021-12-14 ENCOUNTER — Other Ambulatory Visit: Payer: Self-pay | Admitting: Family Medicine

## 2021-12-14 ENCOUNTER — Ambulatory Visit
Admission: RE | Admit: 2021-12-14 | Discharge: 2021-12-14 | Disposition: A | Discharge status: Home / Self Care | Payer: Medicare Other | Source: Ambulatory Visit | Attending: Family Medicine | Admitting: Family Medicine

## 2021-12-14 DIAGNOSIS — R058 Other specified cough: Secondary | ICD-10-CM

## 2021-12-14 DIAGNOSIS — R059 Cough, unspecified: Secondary | ICD-10-CM | POA: Diagnosis not present

## 2021-12-18 ENCOUNTER — Ambulatory Visit: Payer: Medicare Other | Admitting: Nurse Practitioner

## 2021-12-18 ENCOUNTER — Other Ambulatory Visit: Payer: Medicare Other

## 2021-12-20 ENCOUNTER — Inpatient Hospital Stay: Payer: Medicare Other | Attending: Hematology

## 2021-12-20 ENCOUNTER — Other Ambulatory Visit: Payer: Self-pay

## 2021-12-20 ENCOUNTER — Encounter: Payer: Self-pay | Admitting: Hematology

## 2021-12-20 ENCOUNTER — Inpatient Hospital Stay: Payer: Medicare Other | Admitting: Hematology

## 2021-12-20 VITALS — BP 120/65 | HR 70 | Temp 98.6°F | Resp 17 | Ht 66.0 in | Wt 178.7 lb

## 2021-12-20 DIAGNOSIS — Z853 Personal history of malignant neoplasm of breast: Secondary | ICD-10-CM | POA: Diagnosis not present

## 2021-12-20 DIAGNOSIS — Z170 Estrogen receptor positive status [ER+]: Secondary | ICD-10-CM

## 2021-12-20 DIAGNOSIS — E559 Vitamin D deficiency, unspecified: Secondary | ICD-10-CM | POA: Insufficient documentation

## 2021-12-20 DIAGNOSIS — D509 Iron deficiency anemia, unspecified: Secondary | ICD-10-CM | POA: Insufficient documentation

## 2021-12-20 DIAGNOSIS — F32A Depression, unspecified: Secondary | ICD-10-CM | POA: Insufficient documentation

## 2021-12-20 DIAGNOSIS — Z08 Encounter for follow-up examination after completed treatment for malignant neoplasm: Secondary | ICD-10-CM | POA: Diagnosis not present

## 2021-12-20 DIAGNOSIS — Z1231 Encounter for screening mammogram for malignant neoplasm of breast: Secondary | ICD-10-CM | POA: Diagnosis not present

## 2021-12-20 DIAGNOSIS — E2839 Other primary ovarian failure: Secondary | ICD-10-CM

## 2021-12-20 DIAGNOSIS — C50411 Malignant neoplasm of upper-outer quadrant of right female breast: Secondary | ICD-10-CM

## 2021-12-20 LAB — CBC WITH DIFFERENTIAL (CANCER CENTER ONLY)
Abs Immature Granulocytes: 0.07 10*3/uL (ref 0.00–0.07)
Basophils Absolute: 0.1 10*3/uL (ref 0.0–0.1)
Basophils Relative: 1 %
Eosinophils Absolute: 0 10*3/uL (ref 0.0–0.5)
Eosinophils Relative: 0 %
HCT: 36.4 % (ref 36.0–46.0)
Hemoglobin: 11.6 g/dL — ABNORMAL LOW (ref 12.0–15.0)
Immature Granulocytes: 1 %
Lymphocytes Relative: 19 %
Lymphs Abs: 1.8 10*3/uL (ref 0.7–4.0)
MCH: 28 pg (ref 26.0–34.0)
MCHC: 31.9 g/dL (ref 30.0–36.0)
MCV: 87.7 fL (ref 80.0–100.0)
Monocytes Absolute: 0.6 10*3/uL (ref 0.1–1.0)
Monocytes Relative: 6 %
Neutro Abs: 6.9 10*3/uL (ref 1.7–7.7)
Neutrophils Relative %: 73 %
Platelet Count: 316 10*3/uL (ref 150–400)
RBC: 4.15 MIL/uL (ref 3.87–5.11)
RDW: 14.1 % (ref 11.5–15.5)
WBC Count: 9.5 10*3/uL (ref 4.0–10.5)
nRBC: 0 % (ref 0.0–0.2)

## 2021-12-20 LAB — IRON AND IRON BINDING CAPACITY (CC-WL,HP ONLY)
Iron: 65 ug/dL (ref 28–170)
Saturation Ratios: 16 % (ref 10.4–31.8)
TIBC: 409 ug/dL (ref 250–450)
UIBC: 344 ug/dL (ref 148–442)

## 2021-12-20 LAB — FERRITIN: Ferritin: 11 ng/mL (ref 11–307)

## 2021-12-20 LAB — VITAMIN D 25 HYDROXY (VIT D DEFICIENCY, FRACTURES): Vit D, 25-Hydroxy: 40.14 ng/mL (ref 30–100)

## 2021-12-20 NOTE — Progress Notes (Signed)
This nurse requested Endoscopy reports from Dr. Joette Catching' office, [phone].  Spoke with Balinda Quails who will fax reports today.  No further concerns at this time.   ?

## 2021-12-20 NOTE — Progress Notes (Signed)
?[facility]   ?[phone] [phone]   ?Clinic Follow up Note  ? ?Patient Care Team: ?Derinda Late, MD as PCP - General (Family Medicine) ?Truitt Merle, MD as Consulting Physician (Hematology) ?Wilford Corner, MD as Consulting Physician (Gastroenterology) ?Rankin, Clent Demark, MD as Consulting Physician (Ophthalmology) ? ?Date of Service:  12/20/2021 ? ?CHIEF COMPLAINT: f/u of IDA and right breast cancer ? ?CURRENT THERAPY:  ?Surveillance ? ?ASSESSMENT & PLAN:  ?Jessica Roberts is a 76 y.o. post-menopausal female with  ? ?1. Right breast invasive ductal carcinoma, pT1bN0M0, stage IA, grade 1, ER positive, PR positive, HER-2 negative, (+) DCIS and ADH ?-Diagnosed in 2016. Treated with right lumpectomy and radiation. She declined antihormonal therapy. Currently on surveillance ?-most recent mammogram on 02/28/21 was negative. ?-aside from the new breast tenderness, she is clinically doing well. Lab reviewed, her CBC and CMP are within normal limits. Her physical exam showed tenderness just below her previous incision site but was otherwise unremarkable. There is no clinical concern for recurrence. ?-Continue surveillance. Next mammogram in 02/2022. ?-F/u in 6 months  ? ?2. Iron deficient anemia ?-h/o anemia intermittently since 2020. Pt denies overt bleeding. ?-She had colonoscopy and endoscopy (including small bowel) on 07/15/20 with Dr Michail Sermon. Per patient results were benign, no bleeding with hiatal hernia and diverticulosis and gastritis and gallstones. ?-She is on Oral iron once daily. She was treated with IV Venofer $RemoveBe'400mg'[id number]$  x3 each in 07/2020 and 01/2021. ?-labs reviewed, CBC is improved, hgb 11.6. Other labs are pending. ?-She reports she is working on her diet and overall health. She notes she is having labs done with her PCP. ?  ?3. Depression ?-Currently on Zoloft. She has been able to come off the bupropion and diazepam. ?-stable  ?  ?4. Vitamin D Deficiency ?-Restart  Vitamin D 1000 IU daily. The pt notes she has stopped, but will restart this. ?-will monitor closely  ?  ?  ?PLAN: ?-mammogram due 02/2022, ordered today  ?-Will schedule bone density scan. Will try to schedule same day as mammogram. ?-lab in 3 and 6 months ?-f/u in 6 months ?-will call her with her iron study result from today to see if she needs iv iron  ? ? ?No problem-specific Assessment & Plan notes found for this encounter. ? ? ?SUMMARY OF ONCOLOGIC HISTORY: ?Oncology History Overview Note  ?Cancer Staging ?Breast cancer of upper-outer quadrant of right female breast ([facility]) ?Staging form: Breast, AJCC 7th Edition ?- Clinical stage from 04/07/2015: Stage IA (T1a, N0, M0) - Signed by Truitt Merle, MD on 05/20/2015 ?- Pathologic stage from 04/27/2015: Stage IA (T1b, N0, cM0) - Signed by Truitt Merle, MD on 05/20/2015 ? ? ? ? ?  ?Breast cancer of upper-outer quadrant of right female breast ([facility])  ?04/01/2015 Mammogram  ? Diagnostic mammogram and US showed a 0.7x 0.4 x 0.4 cm mass in the right breast 11:00 position, and a second 71mm lesion at upper-midline, and a benign cluster of cysts measuring 1cm.  ? ?  ?04/07/2015 Initial Biopsy  ? Right breast UOQ mass core needle biopsy showed invasive ductal carcinoma and DCIS, grade 1. ER+ (100%), PR+ (80%), HER2/neu negative (ratio 1.25), Ki67 5%. ? ?  ?04/07/2015 Clinical Stage  ? Stage IA: T1a N0 ? ?  ?04/27/2015 Definitive Surgery  ? Right breast double lumpectomy/SLNB: IDC, 0.9 cm, DCIS, invasive dz 0.1 cm from posterior margin, rem. margins >0.5 cm; second lump: fibrocystic change. 1 LN negative for malignancy ? ?  ?04/27/2015 Pathologic  Stage  ? Stage IA: T1b N0 ? ?  ?06/21/2015 - 07/13/2015 Radiation Therapy  ? Adjuvant RT: Right breast 42.72 Gy over 21 fractions.  ? ?  ? Anti-estrogen oral therapy  ? Pt declined adjuvant anti-estrogen therapy  ?  ?09/29/2015 Survivorship  ? Survivorship visit completed and copy of care plan given to patient. ? ?  ?07/17/2016 Imaging  ? MM DIAG BREAST  TOMO BILATERALL 07/17/16 ?MPRESSION: ?Probable mildly complicated fluid collection/seroma within the right ?breast 1130 o'clock along the posterior margin of the lumpectomy ?site. ? ?  ?09/04/2016 Imaging  ? US BREAST LTD UNI RIGHT INC AXILLA: 09/04/16 ?IMPRESSION: ?Stable probably benign postsurgical fluid collection/seroma within ?the right breast at the 11:30 o'clock axis, 6 cm from the nipple, ?measuring 2.3 x 0.9 x 1.2 cm. Recommend additional follow-up right ?breast diagnostic mammogram and ultrasound in 6 months to ensure ?continued stability. ? ?  ?03/21/2017 Mammogram  ? R breast Mammogram and [place]  ?Mammographic images were processed with CAD. ?  ?Targeted ultrasound is performed, showing slightly smaller mixed echogenicity collection within the right breast at 11:30 o'clock 6 cm from nipple measuring 0.9 x 0.7 x 1.4 cm. This is probably a postsurgical seroma. ? ?  ?07/31/2017 Mammogram  ? IMPRESSION: ?1. Indeterminate 9 mm group of fine pleomorphic calcifications involving the upper outer quadrant of the right breast at posterior depth. ?2. New partially obscured mass involving the inner left breast at anterior posterior depth. ? ?  ?08/07/2017 Breast US  ? Targeted right breast ultrasound is performed, showing the previously identified mildly complex fluid collection at the lumpectomy site at the 11:30 o'clock approximately 6 cm from the nipple has decreased in size in the interval, currently measuring 6 x 8 x 8 mm (previously 7 x 9 by 14 mm). No new suspicious solid mass or abnormal acoustic shadowing is identified. ?  ?Targeted left breast ultrasound is performed, showing that the new mammographic mass corresponds to an oval circumscribed parallel hypoechoic mass at the 10 o'clock position approximately 4 cm from the nipple measuring approximately 5 x 4 x 5 mm, demonstrating slight posterior acoustic enhancement and no internal power Doppler flow. ?  ?IMPRESSION: ?1. Interval decrease in size of the  postoperative seroma at the lumpectomy site in the upper outer quadrant of the right breast. ?2. Likely benign 5 mm complex cyst in the upper inner quadrant of the left breast accounting for the new mammographic finding. ? ?  ?08/07/2017 Procedure  ? aspiration of the complex cyst in the upper inner quadrant of the left breast at the 10 o'clock position approximately 4 cm from the nipple. Less than 1 cc of cyst fluid was aspirated. The cyst completely collapsed with aspiration and there is no associated solid component. ? ?  ?08/07/2017 Pathology Results  ? Diagnosis ?Breast, left, needle core biopsy, UIQ at posterior depth ?- BENIGN BREAST TISSUE WITH CALCIFICATIONS. ?- NO MALIGNANCY IDENTIFIED. ?- SEE COMMENT. ? ? ?  ?03/10/2018 Mammogram  ?  ?03/10/2018 Mammogram ?IMPRESSION: ?No mammographic evidence of malignancy involving the LEFT breast. ? ?  ? ? ? ?INTERVAL HISTORY:  ?Casy Tavano is here for a follow up of anemia and breast cancer. She was last seen by me on 02/08/21. She presents to the clinic alone. ?She reports she hasn't seen [place] due to her "mind wandering." However, she notes she has been getting things together. She explains she is working on her diet and putting more work and care into her health. ?She  reports she requested follow up today due to some new right breast pain. She notes she cares for her grandsons, who are rowdy and have run into her breast. ?  ?All other systems were reviewed with the patient and are negative. ? ?MEDICAL HISTORY:  ?Past Medical History:  ?Diagnosis Date  ? Allergy   ? Anxiety   ? Anxiety and depression   ? Breast cancer ([facility]) 04/07/15  ? right breast  ? Breast cancer of upper-outer quadrant of right female breast ([facility]) 04/11/2015  ? Broken nose   ? Complication of anesthesia   ? "had a very hard time waking up"  ? Dengue fever   ? Depression   ? Fall   ? broken nose  ? GERD (gastroesophageal reflux disease)   ? Head injury 1995  ? concussion in [place], med  evac to [place], ear damage with vertigo  ? Head injury, acute, with loss of consciousness ([facility]) 2000  ? out of the country  ? Head injury, closed, with concussion 1986  ? in [place]  ? Headache   ? Hypertension

## 2021-12-23 LAB — CELIAC PANEL 10
Antigliadin Abs, IgA: 2 units (ref 0–19)
Endomysial Ab, IgA: NEGATIVE
Gliadin IgG: 2 units (ref 0–19)
IgA: 126 mg/dL (ref 64–422)
Tissue Transglut Ab: <2 U/mL (ref 0–5)
Tissue Transglutaminase Ab, IgA: <2 U/mL (ref 0–3)

## 2022-01-01 ENCOUNTER — Encounter: Payer: Self-pay | Admitting: Hematology

## 2022-01-04 ENCOUNTER — Encounter (HOSPITAL_COMMUNITY)
Admission: RE | Admit: 2022-01-04 | Discharge: 2022-01-04 | Disposition: A | Discharge status: Home / Self Care | Payer: Medicare Other | Source: Ambulatory Visit | Attending: Hematology | Admitting: Hematology

## 2022-01-04 ENCOUNTER — Other Ambulatory Visit (HOSPITAL_COMMUNITY): Payer: Medicare Other

## 2022-01-04 ENCOUNTER — Ambulatory Visit (HOSPITAL_COMMUNITY): Payer: Medicare Other

## 2022-01-04 DIAGNOSIS — Z170 Estrogen receptor positive status [ER+]: Secondary | ICD-10-CM | POA: Diagnosis not present

## 2022-01-04 DIAGNOSIS — M170 Bilateral primary osteoarthritis of knee: Secondary | ICD-10-CM | POA: Diagnosis not present

## 2022-01-04 DIAGNOSIS — Z853 Personal history of malignant neoplasm of breast: Secondary | ICD-10-CM | POA: Diagnosis not present

## 2022-01-04 DIAGNOSIS — C50411 Malignant neoplasm of upper-outer quadrant of right female breast: Secondary | ICD-10-CM | POA: Diagnosis not present

## 2022-01-04 MED ORDER — TECHNETIUM TC 99M MEDRONATE IV KIT
20.0000 | PACK | Freq: Once | INTRAVENOUS | Status: AC | PRN
  Administered 2022-01-04: 20 via INTRAVENOUS

## 2022-01-17 ENCOUNTER — Encounter (INDEPENDENT_AMBULATORY_CARE_PROVIDER_SITE_OTHER): Payer: Self-pay

## 2022-01-17 DIAGNOSIS — D3131 Benign neoplasm of right choroid: Secondary | ICD-10-CM | POA: Diagnosis not present

## 2022-01-17 DIAGNOSIS — H2513 Age-related nuclear cataract, bilateral: Secondary | ICD-10-CM | POA: Diagnosis not present

## 2022-01-17 DIAGNOSIS — H353131 Nonexudative age-related macular degeneration, bilateral, early dry stage: Secondary | ICD-10-CM | POA: Diagnosis not present

## 2022-01-18 DIAGNOSIS — D5 Iron deficiency 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 (chronic): Secondary | ICD-10-CM | POA: Diagnosis not present

## 2022-01-18 DIAGNOSIS — L820 Inflamed seborrheic keratosis: Secondary | ICD-10-CM | POA: Diagnosis not present

## 2022-01-18 DIAGNOSIS — Z85828 Personal history of other malignant neoplasm of skin: Secondary | ICD-10-CM | POA: Diagnosis not present

## 2022-02-05 ENCOUNTER — Ambulatory Visit: Payer: Medicare Other | Admitting: Nurse Practitioner

## 2022-02-05 ENCOUNTER — Encounter: Payer: Self-pay | Admitting: Nurse Practitioner

## 2022-02-05 VITALS — BP 140/70 | HR 75 | Ht 66.0 in | Wt 182.4 lb

## 2022-02-05 DIAGNOSIS — G4733 Obstructive sleep apnea (adult) (pediatric): Secondary | ICD-10-CM

## 2022-02-05 DIAGNOSIS — I2510 Atherosclerotic heart disease of native coronary artery without angina pectoris: Secondary | ICD-10-CM | POA: Diagnosis not present

## 2022-02-05 DIAGNOSIS — I10 Essential (primary) hypertension: Secondary | ICD-10-CM

## 2022-02-05 DIAGNOSIS — R0609 Other forms of dyspnea: Secondary | ICD-10-CM | POA: Diagnosis not present

## 2022-02-05 DIAGNOSIS — E785 Hyperlipidemia, unspecified: Secondary | ICD-10-CM | POA: Diagnosis not present

## 2022-02-08 ENCOUNTER — Encounter: Payer: Self-pay | Admitting: Hematology

## 2022-02-08 ENCOUNTER — Telehealth: Payer: Self-pay

## 2022-02-08 NOTE — Telephone Encounter (Signed)
Pt LVM stating she wanted to talk to Dr. Burr Medico regarding why she stopped taking her iron.  Pt stated when she was last into see Dr. Burr Medico, she didn't really explain to Dr. Burr Medico as to why she had stop taking her iron.  Pt wants to tell Dr. Burr Medico that the reason was d/t her PCP had taken her off of the iron.  Pt was last seen by Dr. Burr Medico on 12/20/2021.  Pt is requesting to speak with Dr. Burr Medico to further explain why her PCP discontinued the iron.  Notified Dr. Burr Medico of the pt's request.

## 2022-03-01 ENCOUNTER — Ambulatory Visit
Admission: RE | Admit: 2022-03-01 | Discharge: 2022-03-01 | Disposition: A | Discharge status: Home / Self Care | Payer: Medicare Other | Source: Ambulatory Visit | Attending: Hematology | Admitting: Hematology

## 2022-03-01 DIAGNOSIS — Z1231 Encounter for screening mammogram for malignant neoplasm of breast: Secondary | ICD-10-CM

## 2022-03-09 DIAGNOSIS — J440 Chronic obstructive pulmonary disease with acute lower respiratory infection: Secondary | ICD-10-CM | POA: Diagnosis not present

## 2022-03-15 ENCOUNTER — Other Ambulatory Visit: Payer: Self-pay | Admitting: Family Medicine

## 2022-03-15 ENCOUNTER — Ambulatory Visit
Admission: RE | Admit: 2022-03-15 | Discharge: 2022-03-15 | Disposition: A | Discharge status: Home / Self Care | Payer: Medicare Other | Source: Ambulatory Visit | Attending: Family Medicine | Admitting: Family Medicine

## 2022-03-15 DIAGNOSIS — R0602 Shortness of breath: Secondary | ICD-10-CM | POA: Diagnosis not present

## 2022-03-15 DIAGNOSIS — R058 Other specified cough: Secondary | ICD-10-CM

## 2022-03-15 DIAGNOSIS — R059 Cough, unspecified: Secondary | ICD-10-CM | POA: Diagnosis not present

## 2022-03-22 ENCOUNTER — Inpatient Hospital Stay: Payer: Medicare Other | Attending: Hematology

## 2022-03-22 ENCOUNTER — Other Ambulatory Visit: Payer: Self-pay

## 2022-03-22 DIAGNOSIS — D509 Iron deficiency anemia, unspecified: Secondary | ICD-10-CM | POA: Insufficient documentation

## 2022-03-22 DIAGNOSIS — Z08 Encounter for follow-up examination after completed treatment for malignant neoplasm: Secondary | ICD-10-CM | POA: Insufficient documentation

## 2022-03-22 DIAGNOSIS — Z853 Personal history of malignant neoplasm of breast: Secondary | ICD-10-CM | POA: Insufficient documentation

## 2022-03-22 LAB — CBC WITH DIFFERENTIAL (CANCER CENTER ONLY)
Abs Immature Granulocytes: 0.1 10*3/uL — ABNORMAL HIGH (ref 0.00–0.07)
Basophils Absolute: 0.1 10*3/uL (ref 0.0–0.1)
Basophils Relative: 0 %
Eosinophils Absolute: 0.1 10*3/uL (ref 0.0–0.5)
Eosinophils Relative: 1 %
HCT: 36.9 % (ref 36.0–46.0)
Hemoglobin: 12.1 g/dL (ref 12.0–15.0)
Immature Granulocytes: 1 %
Lymphocytes Relative: 21 %
Lymphs Abs: 2.7 10*3/uL (ref 0.7–4.0)
MCH: 28.9 pg (ref 26.0–34.0)
MCHC: 32.8 g/dL (ref 30.0–36.0)
MCV: 88.3 fL (ref 80.0–100.0)
Monocytes Absolute: 0.9 10*3/uL (ref 0.1–1.0)
Monocytes Relative: 7 %
Neutro Abs: 9 10*3/uL — ABNORMAL HIGH (ref 1.7–7.7)
Neutrophils Relative %: 70 %
Platelet Count: 374 10*3/uL (ref 150–400)
RBC: 4.18 MIL/uL (ref 3.87–5.11)
RDW: 14.6 % (ref 11.5–15.5)
WBC Count: 13 10*3/uL — ABNORMAL HIGH (ref 4.0–10.5)
nRBC: 0 % (ref 0.0–0.2)

## 2022-03-22 LAB — IRON AND IRON BINDING CAPACITY (CC-WL,HP ONLY)
Iron: 46 ug/dL (ref 28–170)
Saturation Ratios: 12 % (ref 10.4–31.8)
TIBC: 395 ug/dL (ref 250–450)
UIBC: 349 ug/dL (ref 148–442)

## 2022-03-22 LAB — FERRITIN: Ferritin: 12 ng/mL (ref 11–307)

## 2022-03-30 DIAGNOSIS — J029 Acute pharyngitis, unspecified: Secondary | ICD-10-CM | POA: Diagnosis not present

## 2022-04-05 DIAGNOSIS — F32A Depression, unspecified: Secondary | ICD-10-CM | POA: Diagnosis not present

## 2022-04-05 DIAGNOSIS — Z8711 Personal history of peptic ulcer disease: Secondary | ICD-10-CM | POA: Diagnosis not present

## 2022-04-05 DIAGNOSIS — K2971 Gastritis, unspecified, with bleeding: Secondary | ICD-10-CM | POA: Diagnosis not present

## 2022-04-05 DIAGNOSIS — Z886 Allergy status to analgesic agent status: Secondary | ICD-10-CM | POA: Diagnosis not present

## 2022-04-05 DIAGNOSIS — J449 Chronic obstructive pulmonary disease, unspecified: Secondary | ICD-10-CM | POA: Diagnosis not present

## 2022-04-05 DIAGNOSIS — K219 Gastro-esophageal reflux disease without esophagitis: Secondary | ICD-10-CM | POA: Diagnosis not present

## 2022-04-05 DIAGNOSIS — Z79899 Other long term (current) drug therapy: Secondary | ICD-10-CM | POA: Diagnosis not present

## 2022-04-05 DIAGNOSIS — D649 Anemia, unspecified: Secondary | ICD-10-CM | POA: Diagnosis not present

## 2022-04-05 DIAGNOSIS — K449 Diaphragmatic hernia without obstruction or gangrene: Secondary | ICD-10-CM | POA: Diagnosis not present

## 2022-04-05 DIAGNOSIS — F4310 Post-traumatic stress disorder, unspecified: Secondary | ICD-10-CM | POA: Diagnosis not present

## 2022-04-05 DIAGNOSIS — K2289 Other specified disease of esophagus: Secondary | ICD-10-CM | POA: Diagnosis not present

## 2022-04-05 DIAGNOSIS — I2510 Atherosclerotic heart disease of native coronary artery without angina pectoris: Secondary | ICD-10-CM | POA: Diagnosis not present

## 2022-04-06 DIAGNOSIS — K2289 Other specified disease of esophagus: Secondary | ICD-10-CM | POA: Diagnosis not present

## 2022-04-06 DIAGNOSIS — K2970 Gastritis, unspecified, without bleeding: Secondary | ICD-10-CM | POA: Diagnosis not present

## 2022-04-06 DIAGNOSIS — K449 Diaphragmatic hernia without obstruction or gangrene: Secondary | ICD-10-CM | POA: Diagnosis not present

## 2022-04-06 DIAGNOSIS — D649 Anemia, unspecified: Secondary | ICD-10-CM | POA: Diagnosis not present

## 2022-04-06 DIAGNOSIS — K3189 Other diseases of stomach and duodenum: Secondary | ICD-10-CM | POA: Diagnosis not present

## 2022-04-12 DIAGNOSIS — H2511 Age-related nuclear cataract, right eye: Secondary | ICD-10-CM | POA: Diagnosis not present

## 2022-04-23 DIAGNOSIS — H2512 Age-related nuclear cataract, left eye: Secondary | ICD-10-CM | POA: Diagnosis not present

## 2022-04-26 DIAGNOSIS — H2512 Age-related nuclear cataract, left eye: Secondary | ICD-10-CM | POA: Diagnosis not present

## 2022-04-30 DIAGNOSIS — K2970 Gastritis, unspecified, without bleeding: Secondary | ICD-10-CM | POA: Diagnosis not present

## 2022-04-30 DIAGNOSIS — I2510 Atherosclerotic heart disease of native coronary artery without angina pectoris: Secondary | ICD-10-CM | POA: Diagnosis not present

## 2022-04-30 DIAGNOSIS — D5 Iron deficiency 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 (chronic): Secondary | ICD-10-CM | POA: Diagnosis not present

## 2022-04-30 DIAGNOSIS — K219 Gastro-esophageal reflux disease without esophagitis: Secondary | ICD-10-CM | POA: Diagnosis not present

## 2022-05-02 DIAGNOSIS — R3129 Other microscopic hematuria: Secondary | ICD-10-CM | POA: Diagnosis not present

## 2022-05-22 DIAGNOSIS — R3129 Other microscopic hematuria: Secondary | ICD-10-CM | POA: Diagnosis not present

## 2022-05-31 DIAGNOSIS — L570 Actinic keratosis: Secondary | ICD-10-CM | POA: Diagnosis not present

## 2022-05-31 DIAGNOSIS — Z85828 Personal history of other malignant neoplasm of skin: Secondary | ICD-10-CM | POA: Diagnosis not present

## 2022-05-31 DIAGNOSIS — L821 Other seborrheic keratosis: Secondary | ICD-10-CM | POA: Diagnosis not present

## 2022-05-31 DIAGNOSIS — L820 Inflamed seborrheic keratosis: Secondary | ICD-10-CM | POA: Diagnosis not present

## 2022-06-14 DIAGNOSIS — N898 Other specified noninflammatory disorders of vagina: Secondary | ICD-10-CM | POA: Diagnosis not present

## 2022-06-14 DIAGNOSIS — Z6829 Body mass index (BMI) 29.0-29.9, adult: Secondary | ICD-10-CM | POA: Diagnosis not present

## 2022-06-14 DIAGNOSIS — Z01419 Encounter for gynecological examination (general) (routine) without abnormal findings: Secondary | ICD-10-CM | POA: Diagnosis not present

## 2022-06-14 DIAGNOSIS — Z124 Encounter for screening for malignant neoplasm of cervix: Secondary | ICD-10-CM | POA: Diagnosis not present

## 2022-06-21 ENCOUNTER — Encounter: Payer: Self-pay | Admitting: Hematology

## 2022-06-21 ENCOUNTER — Inpatient Hospital Stay: Payer: Medicare Other | Admitting: Hematology

## 2022-06-21 ENCOUNTER — Other Ambulatory Visit: Payer: Self-pay

## 2022-06-21 ENCOUNTER — Inpatient Hospital Stay: Payer: Medicare Other | Attending: Hematology

## 2022-06-21 VITALS — BP 150/79 | HR 71 | Temp 98.7°F | Resp 18 | Ht 66.0 in | Wt 183.0 lb

## 2022-06-21 DIAGNOSIS — Z170 Estrogen receptor positive status [ER+]: Secondary | ICD-10-CM

## 2022-06-21 DIAGNOSIS — D5 Iron deficiency 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 (chronic): Secondary | ICD-10-CM | POA: Diagnosis not present

## 2022-06-21 DIAGNOSIS — F32A Depression, unspecified: Secondary | ICD-10-CM | POA: Insufficient documentation

## 2022-06-21 DIAGNOSIS — Z1231 Encounter for screening mammogram for malignant neoplasm of breast: Secondary | ICD-10-CM | POA: Diagnosis not present

## 2022-06-21 DIAGNOSIS — Z853 Personal history of malignant neoplasm of breast: Secondary | ICD-10-CM | POA: Diagnosis not present

## 2022-06-21 DIAGNOSIS — Z08 Encounter for follow-up examination after completed treatment for malignant neoplasm: Secondary | ICD-10-CM | POA: Diagnosis not present

## 2022-06-21 DIAGNOSIS — D509 Iron deficiency anemia, unspecified: Secondary | ICD-10-CM | POA: Diagnosis not present

## 2022-06-21 DIAGNOSIS — C50411 Malignant neoplasm of upper-outer quadrant of right female breast: Secondary | ICD-10-CM

## 2022-06-21 LAB — CBC WITH DIFFERENTIAL (CANCER CENTER ONLY)
Abs Immature Granulocytes: 0.05 10*3/uL (ref 0.00–0.07)
Basophils Absolute: 0.1 10*3/uL (ref 0.0–0.1)
Basophils Relative: 1 %
Eosinophils Absolute: 0.4 10*3/uL (ref 0.0–0.5)
Eosinophils Relative: 5 %
HCT: 34.4 % — ABNORMAL LOW (ref 36.0–46.0)
Hemoglobin: 10.4 g/dL — ABNORMAL LOW (ref 12.0–15.0)
Immature Granulocytes: 1 %
Lymphocytes Relative: 27 %
Lymphs Abs: 2 10*3/uL (ref 0.7–4.0)
MCH: 25.9 pg — ABNORMAL LOW (ref 26.0–34.0)
MCHC: 30.2 g/dL (ref 30.0–36.0)
MCV: 85.8 fL (ref 80.0–100.0)
Monocytes Absolute: 0.6 10*3/uL (ref 0.1–1.0)
Monocytes Relative: 8 %
Neutro Abs: 4.3 10*3/uL (ref 1.7–7.7)
Neutrophils Relative %: 58 %
Platelet Count: 328 10*3/uL (ref 150–400)
RBC: 4.01 MIL/uL (ref 3.87–5.11)
RDW: 15.2 % (ref 11.5–15.5)
WBC Count: 7.4 10*3/uL (ref 4.0–10.5)
nRBC: 0 % (ref 0.0–0.2)

## 2022-06-21 LAB — IRON AND IRON BINDING CAPACITY (CC-WL,HP ONLY)
Iron: 35 ug/dL (ref 28–170)
Saturation Ratios: 8 % — ABNORMAL LOW (ref 10.4–31.8)
TIBC: 433 ug/dL (ref 250–450)
UIBC: 398 ug/dL (ref 148–442)

## 2022-06-21 LAB — FERRITIN: Ferritin: 8 ng/mL — ABNORMAL LOW (ref 11–307)

## 2022-06-21 NOTE — Progress Notes (Addendum)
[place]   [phone] [phone]   Clinic Follow up Note   Patient Care Team: Derinda Late, MD as PCP - General (Family Medicine) Elouise Munroe, MD as PCP - Cardiology (Cardiology) Truitt Merle, MD as Consulting Physician (Hematology) Wilford Corner, MD as Consulting Physician (Gastroenterology) Zadie Rhine Clent Demark, MD as Consulting Physician (Ophthalmology)  Date of Service:  06/21/2022  CHIEF COMPLAINT: f/u of anemia and right breast cancer  CURRENT THERAPY:  Surveillance  ASSESSMENT & PLAN:  Jessica Roberts is a 76 y.o. post-menopausal female with   1. Iron deficient anemia -h/o anemia intermittently since 2020. Pt denies overt bleeding. -She had colonoscopy and endoscopy (including small bowel) on 07/15/20 with Dr Michail Sermon. Per patient results were benign, no bleeding with hiatal hernia and diverticulosis and gastritis and gallstones. -She is on Oral iron once daily. She was treated with IV Venofer 475m x3 each in 07/2020 and 01/2021. -labs reviewed, her hgb has dropped again to 10.4. Today's ferritin is pending, but her last on 03/22/22 was 12. I recommend three doses of IV Venofer weekly anytime in the next few weeks. She is agreeable. -will continue to monitor with labs every three months.  2. Right breast invasive ductal carcinoma, pT1bN0M0, stage IA, grade 1, ER positive, PR positive, HER-2 negative, (+) DCIS and ADH -Diagnosed in 2016. S/p right lumpectomy and radiation. She declined antihormonal therapy, now on surveillance -most recent mammogram on 03/01/22 was negative. -aside persistent breast tenderness, she is clinically doing well. Her physical exam showed tenderness just below her previous incision site but was otherwise unremarkable. There is no clinical concern for recurrence. -Continue surveillance. Next mammogram in 02/2023. -F/u in 12 months    3. Depression -Currently on Zoloft. She has been able to come off  the bupropion and diazepam. -stable      PLAN: -mammogram due 02/2023, ordered today  -lab every 3 months -f/u with NP Lacie in 12 months -will forward her labs and my note to her PCP, Dr. BSandi Mariscaland GI    No problem-specific Assessment & Plan notes found for this encounter.   SUMMARY OF ONCOLOGIC HISTORY: Oncology History Overview Note  Cancer Staging Breast cancer of upper-outer quadrant of right female breast ([facility] Staging form: Breast, AJCC 7th Edition - Clinical stage from 04/07/2015: Stage IA (T1a, N0, M0) - Signed by FTruitt Merle MD on 05/20/2015 - Pathologic stage from 04/27/2015: Stage IA (T1b, N0, cM0) - Signed by FTruitt Merle MD on 05/20/2015       Breast cancer of upper-outer quadrant of right female breast ([place]  04/01/2015 Mammogram   Diagnostic mammogram and UKore[place]owed a 0.7x 0.4 x 0.4 cm mass in the right breast 11:00 position, and a second 537mlesion at upper-midline, and a benign cluster of cysts measuring 1cm.    04/07/2015 Initial Biopsy   Right breast UOQ mass core needle biopsy showed invasive ductal carcinoma and DCIS, grade 1. ER+ (100%), PR+ (80%), HER2/neu negative (ratio 1.25), Ki67 5%.   04/07/2015 Clinical Stage   Stage IA: T1a N0   04/27/2015 Definitive Surgery   Right breast double lumpectomy/SLNB: IDC, 0.9 cm, DCIS, invasive dz 0.1 cm from posterior margin, rem. margins >0.5 cm; second lump: fibrocystic change. 1 LN negative for malignancy   04/27/2015 Pathologic Stage   Stage IA: T1b N0   06/21/2015 - 07/13/2015 Radiation Therapy   Adjuvant RT: Right breast 42.72 Gy over 21 fractions.     Anti-estrogen oral therapy   Pt  declined adjuvant anti-estrogen therapy    09/29/2015 Survivorship   Survivorship visit completed and copy of care plan given to patient.   07/17/2016 Imaging   MM DIAG BREAST TOMO BILATERALL 07/17/16 MPRESSION: Probable mildly complicated fluid collection/seroma within the right breast 1130 o'clock along the posterior margin of  the lumpectomy site.   09/04/2016 Imaging   US BREAST LTD UNI RIGHT INC AXILLA: 09/04/16 IMPRESSION: Stable probably benign postsurgical fluid collection/seroma within the right breast at the 11:30 o'clock axis, 6 cm from the nipple, measuring 2.3 x 0.9 x 1.2 cm. Recommend additional follow-up right breast diagnostic mammogram and ultrasound in 6 months to ensure continued stability.   03/21/2017 Mammogram   R breast Mammogram and [place]  Mammographic images were processed with CAD.   Targeted ultrasound is performed, showing slightly smaller mixed echogenicity collection within the right breast at 11:30 o'clock 6 cm from nipple measuring 0.9 x 0.7 x 1.4 cm. This is probably a postsurgical seroma.   07/31/2017 Mammogram   IMPRESSION: 1. Indeterminate 9 mm group of fine pleomorphic calcifications involving the upper outer quadrant of the right breast at posterior depth. 2. New partially obscured mass involving the inner left breast at anterior posterior depth.   08/07/2017 Breast US   Targeted right breast ultrasound is performed, showing the previously identified mildly complex fluid collection at the lumpectomy site at the 11:30 o'clock approximately 6 cm from the nipple has decreased in size in the interval, currently measuring 6 x 8 x 8 mm (previously 7 x 9 by 14 mm). No new suspicious solid mass or abnormal acoustic shadowing is identified.   Targeted left breast ultrasound is performed, showing that the new mammographic mass corresponds to an oval circumscribed parallel hypoechoic mass at the 10 o'clock position approximately 4 cm from the nipple measuring approximately 5 x 4 x 5 mm, demonstrating slight posterior acoustic enhancement and no internal power Doppler flow.   IMPRESSION: 1. Interval decrease in size of the postoperative seroma at the lumpectomy site in the upper outer quadrant of the right breast. 2. Likely benign 5 mm complex cyst in the upper inner quadrant of the left  breast accounting for the new mammographic finding.   08/07/2017 Procedure   aspiration of the complex cyst in the upper inner quadrant of the left breast at the 10 o'clock position approximately 4 cm from the nipple. Less than 1 cc of cyst fluid was aspirated. The cyst completely collapsed with aspiration and there is no associated solid component.   08/07/2017 Pathology Results   Diagnosis Breast, left, needle core biopsy, UIQ at posterior depth - BENIGN BREAST TISSUE WITH CALCIFICATIONS. - NO MALIGNANCY IDENTIFIED. - SEE COMMENT.    03/10/2018 Mammogram    03/10/2018 Mammogram IMPRESSION: No mammographic evidence of malignancy involving the LEFT breast.      INTERVAL HISTORY:  Jessica Roberts is here for a follow up of anemia and breast cancer. She was last seen by me on 12/20/21. She presents to the clinic alone. She reports continued tenderness above her right breast, likely from surgery. She explains she only needs tylenol if her small grandsons bump into her. She reports an instance of black stool while at the beach and underwent capsule endoscopy locally. Per pt, it was negative. She denies any recurrent black stool. It was around this time she held the oral iron; she has since restarted and continues. She has some erythema on her neck and tells ms she has had skin tags removed.  All other systems were reviewed with the patient and are negative.  MEDICAL HISTORY:  Past Medical History:  Diagnosis Date   Allergy    Anxiety    Anxiety and depression    Breast cancer ([place]) 04/07/15   right breast   Breast cancer of upper-outer quadrant of right female breast ([place]) 04/11/2015   Broken nose    Complication of anesthesia    "had a very hard time waking up"   Dengue fever    Depression    Fall    broken nose   GERD (gastroesophageal reflux disease)    Head injury 1995   concussion in [place], med evac to [place], ear damage with vertigo   Head injury, acute, with  loss of consciousness ([place]) 2000   out of the country   Head injury, closed, with concussion 1986   [place]   Headache    Hypertension    Malaria    PONV (postoperative nausea and vomiting)    Typhoid     SURGICAL HISTORY: Past Surgical History:  Procedure Laterality Date   BREAST LUMPECTOMY Right 04/27/2015   BREAST LUMPECTOMY WITH NEEDLE LOCALIZATION AND AXILLARY SENTINEL LYMPH NODE BX Right 04/27/2015   Procedure: RIGHT BREAST LUMPECTOMY WITH  TWO NEEDLE LOCALIZATION AND TWO RIGHT AXILLARY SENTINEL LYMPH NODE BX;  Surgeon: Fanny Skates, MD;  Location: [facility];  Service: General;  Laterality: Right;   COLONOSCOPY W/ BIOPSIES AND POLYPECTOMY     NERVE SURGERY     '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' during surgery for endometriosis   surgical procedure for endometriosis     TONSILLECTOMY      I have reviewed the social history and family history with the patient and they are unchanged from previous note.  ALLERGIES:  is allergic to codeine, oxycontin [oxycodone hcl], percocet [oxycodone-acetaminophen], and percodan [oxycodone-aspirin].  MEDICATIONS:  Current Outpatient Medications  Medication Sig Dispense Refill   acetaminophen (TYLENOL) 500 MG tablet Take 500 mg by mouth 2 (two) times daily as needed for moderate pain (pain). Reported on 10/12/2015     ADVAIR DISKUS 250-50 MCG/ACT AEPB Inhale 1 puff into the lungs 2 (two) times daily.     albuterol (PROVENTIL HFA;VENTOLIN HFA) 108 (90 Base) MCG/ACT inhaler Inhale 1 puff into the lungs every 6 (six) hours as needed for wheezing or shortness of breath.     ARIPiprazole (ABILIFY) 2 MG tablet Take 2 mg by mouth daily.     aspirin EC 81 MG tablet Take 1 tablet (81 mg total) by mouth daily. Swallow whole. 90 tablet 3   metroNIDAZOLE (METROGEL) 0.75 % gel metronidazole 0.75 % topical gel     rosuvastatin (CRESTOR) 40 MG tablet Take 40 mg by mouth daily.     sertraline (ZOLOFT) 100 MG tablet Take 100 mg by mouth daily.      No  current facility-administered medications for this visit.    PHYSICAL EXAMINATION: ECOG PERFORMANCE STATUS: 1 - Symptomatic but completely ambulatory  Vitals:   06/21/22 0812  BP: (!) 150/79  Pulse: 71  Resp: 18  Temp: 98.7 F (37.1 C)  SpO2: 98%   Wt Readings from Last 3 Encounters:  06/21/22 183 lb (83 kg)  02/05/22 182 lb 6.4 oz (82.7 kg)  12/20/21 178 lb 11.2 oz (81.1 kg)     GENERAL:alert, no distress and comfortable SKIN: skin color, texture, turgor are normal, no rashes or significant lesions EYES: normal, Conjunctiva are pink and non-injected, sclera clear  NECK: supple, thyroid normal  size, non-tender, without nodularity LYMPH:  no palpable lymphadenopathy in the cervical, axillary LUNGS: clear to auscultation and percussion with normal breathing effort HEART: regular rate & rhythm and no murmurs and no lower extremity edema ABDOMEN:abdomen soft, non-tender and normal bowel sounds Musculoskeletal:no cyanosis of digits and no clubbing  NEURO: alert & oriented x 3 with fluent speech, no focal motor/sensory deficits BREAST: (+) No palpable mass, nodules or adenopathy bilaterally. Breast exam benign.   LABORATORY DATA:  I have reviewed the data as listed    Latest Ref Rng & Units 06/21/2022    7:59 AM 03/22/2022    7:27 AM 12/20/2021    7:42 AM  CBC  WBC 4.0 - 10.5 K/uL 7.4  13.0  9.5   Hemoglobin 12.0 - 15.0 g/dL 10.4  12.1  11.6   Hematocrit 36.0 - 46.0 % 34.4  36.9  36.4   Platelets 150 - 400 K/uL 328  374  316         Latest Ref Rng & Units 12/28/2020   11:18 AM 09/27/2020   11:12 AM 08/23/2020   11:24 AM  CMP  Glucose 70 - 99 mg/dL 104  99  113   BUN 8 - 23 mg/dL _0 Creatinine 0.44 - 1.00 mg/dL 0.72  0.86  0.77   Sodium 135 - 145 mmol/L 141  142  141   Potassium 3.5 - 5.1 mmol/L 4.7  4.2  4.6   Chloride 98 - 111 mmol/L 108  110  108   CO2 22 - 32 mmol/L _1 Calcium 8.9 - 10.3 mg/dL 9.2  9.3  9.4   Total Protein 6.5 - 8.1 g/dL 7.1   7.6  7.5   Total Bilirubin 0.3 - 1.2 mg/dL 0.4  0.3  0.3   Alkaline Phos 38 - 126 U/L 101  93  89   AST 15 - 41 U/L _2 ALT 0 - 44 U/L _3 RADIOGRAPHIC STUDIES: I have personally reviewed the radiological images as listed and agreed with the findings in the report. No results found.    Orders Placed This Encounter  Procedures   MM Digital Screening    Standing Status:   Future    Standing Expiration Date:   06/21/2023    Order Specific Question:   Reason for Exam (SYMPTOM  OR DIAGNOSIS REQUIRED)    Answer:   screening    Order Specific Question:   Preferred imaging location?    Answer:   [facility]   All questions were answered. The patient knows to call the clinic with any problems, questions or concerns. No barriers to learning was detected. The total time spent in the appointment was 30 minutes.     Truitt Merle, MD 06/21/2022   I, Wilburn Mylar, am acting as scribe for Truitt Merle, MD.   I have reviewed the above documentation for accuracy and completeness, and I agree with the above.

## 2022-06-26 ENCOUNTER — Inpatient Hospital Stay: Payer: Medicare Other

## 2022-06-26 VITALS — BP 165/82 | HR 65 | Temp 98.2°F | Resp 18

## 2022-06-26 DIAGNOSIS — Z08 Encounter for follow-up examination after completed treatment for malignant neoplasm: Secondary | ICD-10-CM | POA: Diagnosis not present

## 2022-06-26 DIAGNOSIS — D5 Iron deficiency 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 (chronic): Secondary | ICD-10-CM

## 2022-06-26 DIAGNOSIS — F32A Depression, unspecified: Secondary | ICD-10-CM | POA: Diagnosis not present

## 2022-06-26 DIAGNOSIS — D509 Iron deficiency anemia, unspecified: Secondary | ICD-10-CM | POA: Diagnosis not present

## 2022-06-26 DIAGNOSIS — Z853 Personal history of malignant neoplasm of breast: Secondary | ICD-10-CM | POA: Diagnosis not present

## 2022-06-26 MED ORDER — DIPHENHYDRAMINE HCL 25 MG PO CAPS
25.0000 mg | ORAL_CAPSULE | Freq: Once | ORAL | Status: AC
  Administered 2022-06-26: 25 mg via ORAL
  Filled 2022-06-26: qty 1

## 2022-06-26 MED ORDER — SODIUM CHLORIDE 0.9 % IV SOLN
400.0000 mg | Freq: Once | INTRAVENOUS | Status: AC
  Administered 2022-06-26: 400 mg via INTRAVENOUS
  Filled 2022-06-26: qty 20

## 2022-06-26 MED ORDER — SODIUM CHLORIDE 0.9 % IV SOLN: INTRAVENOUS | Status: DC

## 2022-06-26 NOTE — Progress Notes (Signed)
Pt declined 30 min post iron observation. 

## 2022-06-26 NOTE — Patient Instructions (Signed)

## 2022-07-12 ENCOUNTER — Other Ambulatory Visit: Payer: Self-pay

## 2022-07-12 ENCOUNTER — Inpatient Hospital Stay: Payer: Medicare Other

## 2022-07-12 VITALS — BP 148/75 | HR 65 | Temp 97.9°F | Resp 18

## 2022-07-12 DIAGNOSIS — D509 Iron deficiency anemia, unspecified: Secondary | ICD-10-CM | POA: Diagnosis not present

## 2022-07-12 DIAGNOSIS — D5 Iron deficiency 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 (chronic): Secondary | ICD-10-CM

## 2022-07-12 DIAGNOSIS — Z08 Encounter for follow-up examination after completed treatment for malignant neoplasm: Secondary | ICD-10-CM | POA: Diagnosis not present

## 2022-07-12 DIAGNOSIS — Z853 Personal history of malignant neoplasm of breast: Secondary | ICD-10-CM | POA: Diagnosis not present

## 2022-07-12 DIAGNOSIS — F32A Depression, unspecified: Secondary | ICD-10-CM | POA: Diagnosis not present

## 2022-07-12 MED ORDER — SODIUM CHLORIDE 0.9 % IV SOLN: Freq: Once | INTRAVENOUS | Status: AC

## 2022-07-12 MED ORDER — SODIUM CHLORIDE 0.9 % IV SOLN
400.0000 mg | Freq: Once | INTRAVENOUS | Status: AC
  Administered 2022-07-12: 400 mg via INTRAVENOUS
  Filled 2022-07-12: qty 20

## 2022-07-12 MED ORDER — DIPHENHYDRAMINE HCL 25 MG PO CAPS
25.0000 mg | ORAL_CAPSULE | Freq: Once | ORAL | Status: AC
  Administered 2022-07-12: 25 mg via ORAL
  Filled 2022-07-12: qty 1

## 2022-07-12 NOTE — Progress Notes (Signed)
Pt declined to stay for post 30 min obs, discharged with VSS, ambulatory to lobby 

## 2022-07-12 NOTE — Patient Instructions (Signed)

## 2022-07-17 DIAGNOSIS — D5 Iron deficiency 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 (chronic): Secondary | ICD-10-CM | POA: Diagnosis not present

## 2022-07-17 DIAGNOSIS — R7302 Impaired glucose tolerance (oral): Secondary | ICD-10-CM | POA: Diagnosis not present

## 2022-07-17 DIAGNOSIS — E782 Mixed hyperlipidemia: Secondary | ICD-10-CM | POA: Diagnosis not present

## 2022-07-19 ENCOUNTER — Ambulatory Visit: Payer: Medicare Other

## 2022-07-24 DIAGNOSIS — E119 Type 2 diabetes mellitus without complications: Secondary | ICD-10-CM | POA: Diagnosis not present

## 2022-07-24 DIAGNOSIS — K2970 Gastritis, unspecified, without bleeding: Secondary | ICD-10-CM | POA: Diagnosis not present

## 2022-07-24 DIAGNOSIS — D5 Iron deficiency 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 (chronic): Secondary | ICD-10-CM | POA: Diagnosis not present

## 2022-07-24 DIAGNOSIS — J449 Chronic obstructive pulmonary disease, unspecified: Secondary | ICD-10-CM | POA: Diagnosis not present

## 2022-07-26 ENCOUNTER — Inpatient Hospital Stay: Payer: Medicare Other | Attending: Hematology

## 2022-07-26 VITALS — BP 141/66 | HR 81 | Temp 98.4°F | Resp 20

## 2022-07-26 DIAGNOSIS — D5 Iron deficiency 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 (chronic): Secondary | ICD-10-CM

## 2022-07-26 DIAGNOSIS — D509 Iron deficiency anemia, unspecified: Secondary | ICD-10-CM | POA: Insufficient documentation

## 2022-07-26 MED ORDER — SODIUM CHLORIDE 0.9 % IV SOLN: INTRAVENOUS | Status: DC

## 2022-07-26 MED ORDER — SODIUM CHLORIDE 0.9 % IV SOLN
400.0000 mg | Freq: Once | INTRAVENOUS | Status: AC
  Administered 2022-07-26: 400 mg via INTRAVENOUS
  Filled 2022-07-26: qty 20

## 2022-07-26 MED ORDER — DIPHENHYDRAMINE HCL 25 MG PO CAPS
25.0000 mg | ORAL_CAPSULE | Freq: Once | ORAL | Status: AC
  Administered 2022-07-26: 25 mg via ORAL
  Filled 2022-07-26: qty 1

## 2022-07-26 NOTE — Progress Notes (Signed)
Patient tolerated her iron well today. Stayed for the entire 30 minute observation. No signs of any issues. Patient did have to use her inhaler for her COPD, but not related to a reaction to the iron. VSS- BP (!) 141/66 (BP Location: Left Arm, Patient Position: Sitting)   Pulse 81   Temp 98.4 F (36.9 C) (Oral)   Resp 20   SpO2 98%

## 2022-07-26 NOTE — Patient Instructions (Signed)

## 2022-08-25 ENCOUNTER — Encounter: Payer: Self-pay | Admitting: Hematology

## 2022-09-03 DIAGNOSIS — Z853 Personal history of malignant neoplasm of breast: Secondary | ICD-10-CM | POA: Diagnosis not present

## 2022-09-03 DIAGNOSIS — F331 Major depressive disorder, recurrent, moderate: Secondary | ICD-10-CM | POA: Diagnosis not present

## 2022-09-03 DIAGNOSIS — F4311 Post-traumatic stress disorder, acute: Secondary | ICD-10-CM | POA: Diagnosis not present

## 2022-09-03 DIAGNOSIS — Z712 Person consulting for explanation of examination or test findings: Secondary | ICD-10-CM | POA: Diagnosis not present

## 2022-09-03 DIAGNOSIS — Z809 Family history of malignant neoplasm, unspecified: Secondary | ICD-10-CM | POA: Diagnosis not present

## 2022-09-05 ENCOUNTER — Ambulatory Visit (INDEPENDENT_AMBULATORY_CARE_PROVIDER_SITE_OTHER): Payer: Medicare Other

## 2022-09-05 ENCOUNTER — Ambulatory Visit (INDEPENDENT_AMBULATORY_CARE_PROVIDER_SITE_OTHER): Payer: Medicare Other | Admitting: Orthopaedic Surgery

## 2022-09-05 ENCOUNTER — Other Ambulatory Visit: Payer: Self-pay

## 2022-09-05 DIAGNOSIS — M25561 Pain in right knee: Secondary | ICD-10-CM

## 2022-09-05 DIAGNOSIS — M25571 Pain in right ankle and joints of right foot: Secondary | ICD-10-CM

## 2022-09-05 MED ORDER — PREDNISONE 20 MG PO TABS: 20.0000 mg | ORAL_TABLET | Freq: Every day | ORAL | 0 refills | Status: DC

## 2022-09-05 NOTE — Progress Notes (Signed)
A 77 year old female that I am seeing as a new patient for the first time.  She is referred from her primary care physician Dr. Derinda Late to evaluate and treat bilateral knee pain but she is also needing evaluation of right ankle pain and she says her left fifth finger has been hurting quite a bit and she points to the DIP joint as a source of her pain.  She has had no known injuries.  She has never had surgery on her knees but she said when she has been sitting for a while and goes to get up her knees are slightly painful and stiff.  She does not have pain that wakes her up at night and she denies any instability symptoms of her knees.  She has never had injections either in either knee.  She has had no injury of her left fifth finger but did have an ankle sprain about 20 years ago when she points to the lateral malleolus and just posterior to that as a source of her pain on the right ankle.  I was able to review her medical history in detail.  Both knees were examined today and show no effusion.  Both knees have excellent range of motion and no instability on exam.  Both knees have just very slight varus malalignment and some patellofemoral crepitation but are both strong.  An AP and lateral of the right knee she also shows left knee there is only slight medial joint space narrowing on the right side.  Overall alignment is well-maintained.  Examination of her right ankle shows some pain over the peroneal tendons but no subluxation of the tendons.  Her range of motion of her ankle is full.  3 views of the right ankle show normal-appearing ankle joint.  Examination of her left hand fifth finger shows osteophytes and swelling around the DIP joint consistent with osteoarthritis.  I would like to send her to outpatient physical therapy for just a few short visits where she can be shown a home exercise program for strengthening her bilateral lower extremities and especially her quads will help her get  up from a seated position easier and just help overall with her mobility.  I think that is what is going to help her more than anything in terms of just her overall strengthening of these knees.  I will send in some prednisone to see if this will calm down the acute pain with her left fifth finger and have also recommended Voltaren gel that she can try on her finger as well as her knees and her ankle.  She agrees with this treatment plan.  Will see her back in 4 weeks.

## 2022-09-11 ENCOUNTER — Other Ambulatory Visit: Payer: Self-pay

## 2022-09-11 ENCOUNTER — Encounter: Payer: Self-pay | Admitting: Physical Therapy

## 2022-09-11 ENCOUNTER — Ambulatory Visit: Payer: Medicare Other | Admitting: Physical Therapy

## 2022-09-11 DIAGNOSIS — M25562 Pain in left knee: Secondary | ICD-10-CM | POA: Diagnosis not present

## 2022-09-11 DIAGNOSIS — R600 Localized edema: Secondary | ICD-10-CM

## 2022-09-11 DIAGNOSIS — M25571 Pain in right ankle and joints of right foot: Secondary | ICD-10-CM

## 2022-09-11 DIAGNOSIS — M6281 Muscle weakness (generalized): Secondary | ICD-10-CM

## 2022-09-11 DIAGNOSIS — R262 Difficulty in walking, not elsewhere classified: Secondary | ICD-10-CM

## 2022-09-11 DIAGNOSIS — R29810 Facial weakness: Secondary | ICD-10-CM

## 2022-09-11 DIAGNOSIS — G8929 Other chronic pain: Secondary | ICD-10-CM

## 2022-09-11 DIAGNOSIS — M25561 Pain in right knee: Secondary | ICD-10-CM

## 2022-09-11 NOTE — Therapy (Signed)
OUTPATIENT PHYSICAL THERAPY LOWER EXTREMITY EVALUATION   Patient Name: Jessica Roberts MRN: [id number] DOB:09-Jul-1946, 77 y.o., female Today's Date: 09/11/2022  END OF SESSION:  PT End of Session - 09/11/22 1102     Visit Number 1    Number of Visits 20    Date for PT Re-Evaluation 11/23/22    Progress Note Due on Visit 10    Activity Tolerance Patient tolerated treatment well    Behavior During Therapy [facility] for tasks assessed/performed             Past Medical History:  Diagnosis Date   Allergy    Anxiety    Anxiety and depression    Breast cancer ([place]) 04/07/15   right breast   Breast cancer of upper-outer quadrant of right female breast ([place]) 04/11/2015   Broken nose    Complication of anesthesia    "had a very hard time waking up"   Dengue fever    Depression    Fall    broken nose   GERD (gastroesophageal reflux disease)    Head injury 1995   concussion in [place], med evac to [place], ear damage with vertigo   Head injury, acute, with loss of consciousness ([place]) 2000   [place]   Head [place], closed, with concussion 1986   [place]   Headache    Hypertension    Malaria    PONV (postoperative nausea and vomiting)    Typhoid    Past Surgical History:  Procedure Laterality Date   BREAST LUMPECTOMY Right 04/27/2015   BREAST LUMPECTOMY WITH NEEDLE LOCALIZATION AND AXILLARY SENTINEL LYMPH NODE BX Right 04/27/2015   Procedure: RIGHT BREAST LUMPECTOMY WITH  TWO NEEDLE LOCALIZATION AND TWO RIGHT AXILLARY SENTINEL LYMPH NODE BX;  Surgeon: Fanny Skates, MD;  Location: [facility] OR;  Service: General;  Laterality: Right;   COLONOSCOPY W/ BIOPSIES AND POLYPECTOMY     NERVE SURGERY     '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' during surgery for endometriosis   surgical procedure for endometriosis     TONSILLECTOMY     Patient Active Problem List   Diagnosis Date Noted   Early stage nonexudative age-related macular degeneration of both eyes 11/27/2021    Choroidal nevus, right 11/27/2021   Nuclear sclerotic cataract of both eyes 11/27/2021   Iron deficiency 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 (chronic) 07/19/2020   Breast cancer of upper-outer quadrant of right female breast ([place]) 04/11/2015    PCP: Derinda Late, MD   REFERRING PROVIDER:  Mcarthur Rossetti, MD   REFERRING DIAG:  Diagnosis  M25.561,M25.562 (ICD-10-CM) - Pain in both knees, unspecified chronicity    THERAPY DIAG:  Chronic pain of right knee  Chronic pain of left knee  Localized edema  Difficulty in walking, not elsewhere classified  Muscle weakness (generalized)  Pain in right ankle and joints of right foot  Rationale for Evaluation and Treatment: Rehabilitation  ONSET DATE: years ago   SUBJECTIVE STATEMENT: Pt arriving to therapy reporting bilateral knee pain and Rt ankle pain. Pt stating her pain is worse on the Rt LE. Pt stating her pain has been going on for a few years. Pt stating a severe ankle sprain/injury over 30 years ago on her Rt ankle which she has limited ROM since. Pt stating going up and down the stairs make her knee pain worse.   PERTINENT HISTORY: Allergies, breast CA, broken nose, h/o fall, depression, anxiety, head injury, HTN, breast lumpectomy  PAIN:  NPRS scale: 2/10  Pain location: Rt knee and Rt ankle Pain description: achy, tightness Aggravating factors: up and down stairs, when transitioning from sit to stand Relieving factors: Tylenol, Voltaran gel  PRECAUTIONS: None  WEIGHT BEARING RESTRICTIONS: No  FALLS:  Has patient fallen in last 6 months? No  LIVING ENVIRONMENT: Lives with: lives with their family and lives alone Lives in: House/apartment Stairs: Yes: External: 4 steps; bilateral but cannot reach both Has following equipment at home: None  OCCUPATION: retired  PLOF: Independent  PATIENT GOALS: Get up and down out of recliner with no pain or stiffness     Stop knee from "giving out" when going up  steps     OBJECTIVE:   DIAGNOSTIC FINDINGS: Result Narrative from 09/05/22  2 views of the right knee show just slight medial joint space narrowing.   There is otherwise no acute findings.  PATIENT SURVEYS:  09/11/22: FOTO intake: 49   predicted:  62  COGNITION: Overall cognitive status: WFL    SENSATION: WFL  EDEMA:    MUSCLE LENGTH: 09/11/22: Hamstrings: Right 40 deg; Left 45 deg   POSTURE: rounded shoulders and forward head, decreased lumbar lordosis  PALPATION: No pin point tenderness note at eval  LOWER EXTREMITY ROM:   ROM Right 09/11/22 Left 09/11/22  Hip flexion 100 106  Hip extension    Hip abduction    Hip adduction    Hip internal rotation    Hip external rotation    Knee flexion 124 130  Knee extension 0 0  Ankle dorsiflexion 2 20  Ankle plantarflexion 18 32  Ankle inversion 34 40  Ankle eversion 30 34   (Blank rows = not tested)  LOWER EXTREMITY MMT:  MMT Right Eval 09/11/22 Left Eval 09/11/22  Hip flexion 4/5 5/5  Hip extension    Hip abduction 4/5 4+/5  Hip adduction 4/5 4+/5  Hip internal rotation    Hip external rotation    Knee flexion 5/5 5/5  Knee extension 5/5 5/5  Ankle dorsiflexion 4/5 5/5  Ankle plantarflexion 4/5 5/5  Ankle inversion 4/5 5/5  Ankle eversion 4/5 5/5   (Blank rows = not tested)    FUNCTIONAL TESTS:  09/11/22: 5 time sit to stand: 16 seconds no UE support  GAIT: Distance walked: 30 feet  Assistive device utilized: None Level of assistance: Complete Independence Comments: mild decreased heel strike in Rt LE   TODAY'S TREATMENT                                                                          DATE:  09/11/22:  Therex:    HEP instruction/performance c cues for techniques, handout provided.  Trial set performed of each for comprehension and symptom assessment.  See below for exercise list  PATIENT EDUCATION:  Education details: HEP, POC Person educated: Patient Education method: Explanation,  Demonstration, Verbal cues, and Handouts Education comprehension: verbalized understanding, returned demonstration, and verbal cues required  HOME EXERCISE PROGRAM: Access Code: [email or web address] URL: [email or web address] Date: 09/11/2022 Prepared by: Kearney Hard  Exercises - Supine Bridge  - 2 x daily - 2 sets - 10 reps - 5 seconds hold - Hooklying Hamstring Stretch with Strap  - 2 x daily - 3 reps - 30  seconds hold - Seated Hamstring Stretch  - 2 x daily - 3 reps - 30 seconds hold - Seated Ankle Alphabet  - 2 x daily - 1 reps - Heel Raises with Counter Support  - 2 x daily - 2 sets - 10 reps  ASSESSMENT:  CLINICAL IMPRESSION: Patient is a 77 y.o. who comes to clinic with complaints of bilateral knee pain and rt ankle pain with mobility, strength and movement coordination deficits that impair their ability to perform usual daily and recreational functional activities without increase difficulty/symptoms at this time.  Patient to benefit from skilled PT services to address impairments and limitations to improve to previous level of function without restriction secondary to condition.   OBJECTIVE IMPAIRMENTS: decreased activity tolerance, difficulty walking, decreased ROM, decreased strength, and pain.   ACTIVITY LIMITATIONS: standing, squatting, stairs, and transfers  PARTICIPATION LIMITATIONS: cleaning and community activity  PERSONAL FACTORS: 3+ comorbidities: see above history  are also affecting patient's functional outcome.   REHAB POTENTIAL: Good  CLINICAL DECISION MAKING: Stable/uncomplicated  EVALUATION COMPLEXITY: Low   GOALS: Goals reviewed with patient? Yes  SHORT TERM GOALS: (target date for Short term goals are 3 weeks 10/06/22)   1.  Patient will demonstrate independent use of home exercise program to maintain progress from in clinic treatments.  Goal status: New  LONG TERM GOALS: (target dates for all long term goals are 10 weeks  11/23/22 )   1.  Patient will demonstrate/report pain at worst less than or equal to 2/10 to facilitate minimal limitation in daily activity secondary to pain symptoms.  Goal status: New   2. Patient will demonstrate independent use of home exercise program to facilitate ability to maintain/progress functional gains from skilled physical therapy services.  Goal status: New   3. Patient will demonstrate FOTO outcome > or = 62 % to indicate reduced disability due to condition.  Goal status: New   4.  Pt will be able to amb up and down 1 flight of stairs with single hand rail with step over step gait pattern with no pain reported.   Goal status: New   5.  Patient will be able to improve her 5 time sit to stand to </= 12 seconds with no UE support for improved functional mobility  Goal status: New   6.  Patient will be able to report able 20 minutes with no pain in bil knees or Rt ankle.  Goal status: New      PLAN:  PT FREQUENCY: 1-2x/week  PT DURATION: 10 weeks  PLANNED INTERVENTIONS: Therapeutic exercises, Therapeutic activity, Neuro Muscular re-education, Balance training, Gait training, Patient/Family education, Joint mobilization, Stair training, DME instructions, Dry Needling, Electrical stimulation, Traction, Cryotherapy, vasopneumatic deviceMoist heat, Taping, Ultrasound, Ionotophoresis '4mg'$ /ml Dexamethasone, and Manual therapy.  All included unless contraindicated  PLAN FOR NEXT SESSION: Review HEP knowledge/results, general LE strengthening, 4 way ankle exercises and functional mobility training         Oretha Caprice, PT, MPT 09/11/2022, 12:10 PM

## 2022-09-17 ENCOUNTER — Encounter: Payer: Medicare Other | Admitting: Physical Therapy

## 2022-09-18 ENCOUNTER — Other Ambulatory Visit: Payer: Self-pay

## 2022-09-18 DIAGNOSIS — D5 Iron deficiency 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 (chronic): Secondary | ICD-10-CM

## 2022-09-18 DIAGNOSIS — Z170 Estrogen receptor positive status [ER+]: Secondary | ICD-10-CM

## 2022-09-19 ENCOUNTER — Inpatient Hospital Stay: Payer: Medicare Other | Attending: Hematology

## 2022-09-19 ENCOUNTER — Ambulatory Visit: Payer: Medicare Other | Admitting: Physical Therapy

## 2022-09-19 ENCOUNTER — Encounter: Payer: Self-pay | Admitting: Physical Therapy

## 2022-09-19 DIAGNOSIS — R262 Difficulty in walking, not elsewhere classified: Secondary | ICD-10-CM | POA: Diagnosis not present

## 2022-09-19 DIAGNOSIS — M25561 Pain in right knee: Secondary | ICD-10-CM

## 2022-09-19 DIAGNOSIS — M25571 Pain in right ankle and joints of right foot: Secondary | ICD-10-CM

## 2022-09-19 DIAGNOSIS — R600 Localized edema: Secondary | ICD-10-CM | POA: Diagnosis not present

## 2022-09-19 DIAGNOSIS — M25562 Pain in left knee: Secondary | ICD-10-CM | POA: Diagnosis not present

## 2022-09-19 DIAGNOSIS — G8929 Other chronic pain: Secondary | ICD-10-CM

## 2022-09-19 DIAGNOSIS — M6281 Muscle weakness (generalized): Secondary | ICD-10-CM

## 2022-09-19 NOTE — Therapy (Signed)
OUTPATIENT PHYSICAL THERAPY TREATMENT NOTE   Patient Name: Jessica Roberts MRN: [id number] DOB:May 26, 1946, 77 y.o., female Today's Date: 09/19/2022  END OF SESSION:   PT End of Session - 09/19/22 1310     Visit Number 2    Number of Visits 20    Date for PT Re-Evaluation 11/23/22    Progress Note Due on Visit 10    PT Start Time 3295    PT Stop Time 1343    PT Time Calculation (min) 38 min    Activity Tolerance Patient tolerated treatment well    Behavior During Therapy [facility] for tasks assessed/performed             Past Medical History:  Diagnosis Date   Allergy    Anxiety    Anxiety and depression    Breast cancer ([place]) 04/07/15   right breast   Breast cancer of upper-outer quadrant of right female breast ([place]) 04/11/2015   Broken nose    Complication of anesthesia    "had a very hard time waking up"   Dengue fever    Depression    Fall    broken nose   GERD (gastroesophageal reflux disease)    Head injury 1995   concussion in [place], med evac to [place], ear damage with vertigo   Head injury, acute, with loss of consciousness ([place]) 2000   [place] the [place]   Head injury, closed, with concussion 1986   [place]   Headache    Hypertension    Malaria    PONV (postoperative nausea and vomiting)    Typhoid    Past Surgical History:  Procedure Laterality Date   BREAST LUMPECTOMY Right 04/27/2015   BREAST LUMPECTOMY WITH NEEDLE LOCALIZATION AND AXILLARY SENTINEL LYMPH NODE BX Right 04/27/2015   Procedure: RIGHT BREAST LUMPECTOMY WITH  TWO NEEDLE LOCALIZATION AND TWO RIGHT AXILLARY SENTINEL LYMPH NODE BX;  Surgeon: Fanny Skates, MD;  Location: [facility];  Service: General;  Laterality: Right;   COLONOSCOPY W/ BIOPSIES AND POLYPECTOMY     NERVE SURGERY     '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' during surgery for endometriosis   surgical procedure for endometriosis     TONSILLECTOMY     Patient Active Problem List   Diagnosis Date Noted   Early stage  nonexudative age-related macular degeneration of both eyes 11/27/2021   Choroidal nevus, right 11/27/2021   Nuclear sclerotic cataract of both eyes 11/27/2021   Iron deficiency 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 (chronic) 07/19/2020   Breast cancer of upper-outer quadrant of right female breast ([place]) 04/11/2015     THERAPY DIAG:  Chronic pain of right knee  Chronic pain of left knee  Localized edema  Difficulty in walking, not elsewhere classified  Muscle weakness (generalized)  Pain in right ankle and joints of right foot   PCP: Derinda Late, MD   REFERRING PROVIDER:  Mcarthur Rossetti, MD   REFERRING DIAG:  Diagnosis  M25.561,M25.562 (ICD-10-CM) - Pain in both knees, unspecified chronicity    EVAL THERAPY DIAG:  Chronic pain of right knee  Chronic pain of left knee  Localized edema  Difficulty in walking, not elsewhere classified  Muscle weakness (generalized)  Pain in right ankle and joints of right foot  Rationale for Evaluation and Treatment: Rehabilitation  ONSET DATE: years ago   SUBJECTIVE STATEMENT: Knees feel "better."  Reports she over exercised the other day doing calf raises which created some soreness.   PERTINENT HISTORY: Allergies, breast CA, broken  nose, h/o fall, depression, anxiety, head injury, HTN, breast lumpectomy  PAIN:  NPRS scale: 0/10 Pain location: Rt knee and Rt ankle Pain description: achy, tightness Aggravating factors: up and down stairs, when transitioning from sit to stand Relieving factors: Tylenol, Voltaran gel  PRECAUTIONS: None  WEIGHT BEARING RESTRICTIONS: No  FALLS:  Has patient fallen in last 6 months? No  LIVING ENVIRONMENT: Lives with: lives with their family and lives alone Lives in: House/apartment Stairs: Yes: External: 4 steps; bilateral but cannot reach both Has following equipment at home: None  OCCUPATION: retired  PLOF: Independent  PATIENT GOALS: Get up and down out of recliner  with no pain or stiffness     Stop knee from "giving out" when going up steps     OBJECTIVE:   DIAGNOSTIC FINDINGS: Result Narrative from 09/05/22  2 views of the right knee show just slight medial joint space narrowing.   There is otherwise no acute findings.  PATIENT SURVEYS:  09/11/22: FOTO intake: 49   predicted:  62  MUSCLE LENGTH: 09/11/22: Hamstrings: Right 40 deg; Left 45 deg   POSTURE: rounded shoulders and forward head, decreased lumbar lordosis  PALPATION: No pin point tenderness note at eval  LOWER EXTREMITY ROM:   ROM Right 09/11/22 Left 09/11/22  Hip flexion 100 106  Hip extension    Hip abduction    Hip adduction    Hip internal rotation    Hip external rotation    Knee flexion 124 130  Knee extension 0 0  Ankle dorsiflexion 2 20  Ankle plantarflexion 18 32  Ankle inversion 34 40  Ankle eversion 30 34   (Blank rows = not tested)  LOWER EXTREMITY MMT:  MMT Right Eval 09/11/22 Left Eval 09/11/22  Hip flexion 4/5 5/5  Hip extension    Hip abduction 4/5 4+/5  Hip adduction 4/5 4+/5  Hip internal rotation    Hip external rotation    Knee flexion 5/5 5/5  Knee extension 5/5 5/5  Ankle dorsiflexion 4/5 5/5  Ankle plantarflexion 4/5 5/5  Ankle inversion 4/5 5/5  Ankle eversion 4/5 5/5   (Blank rows = not tested)    FUNCTIONAL TESTS:  09/11/22: 5 time sit to stand: 16 seconds no UE support  GAIT: Distance walked: 30 feet  Assistive device utilized: None Level of assistance: Complete Independence Comments: mild decreased heel strike in Rt LE   TODAY'S TREATMENT                                                                          DATE: 09/19/22 TherEx NuStep L5 x 8 min Seated hamstring stretch on Rt 3x30 sec Seated ankle alphabet A-Z Bridges 20 reps x 5 sec hold Rt hamstring stretch 3x30 sec Standing calf raises x 10 reps Rt gastroc stretch 2x30 sec Standing hip abduction 2x10 bil with L3 band Standing hip extension 2x10 bil with  L3 band   09/11/22:  Therex:    HEP instruction/performance c cues for techniques, handout provided.  Trial set performed of each for comprehension and symptom assessment.  See below for exercise list  PATIENT EDUCATION:  Education details: HEP, POC Person educated: Patient Education method: Explanation, Demonstration, Verbal cues, and Handouts Education comprehension:  verbalized understanding, returned demonstration, and verbal cues required  HOME EXERCISE PROGRAM: Access Code: [email or web address] URL: [email or web address] Date: 09/11/2022 Prepared by: Kearney Hard  Exercises - Supine Bridge  - 2 x daily - 2 sets - 10 reps - 5 seconds hold - Hooklying Hamstring Stretch with Strap  - 2 x daily - 3 reps - 30 seconds hold - Seated Hamstring Stretch  - 2 x daily - 3 reps - 30 seconds hold - Seated Ankle Alphabet  - 2 x daily - 1 reps - Heel Raises with Counter Support  - 2 x daily - 2 sets - 10 reps  ASSESSMENT:  CLINICAL IMPRESSION: Pt tolerated session well today with good understanding of review of HEP today.  No changes made as she's doing well and progressing well.  Encouraged considering gym program to continue after PT.  OBJECTIVE IMPAIRMENTS: decreased activity tolerance, difficulty walking, decreased ROM, decreased strength, and pain.   ACTIVITY LIMITATIONS: standing, squatting, stairs, and transfers  PARTICIPATION LIMITATIONS: cleaning and community activity  PERSONAL FACTORS: 3+ comorbidities: see above history are also affecting patient's functional outcome.   REHAB POTENTIAL: Good  CLINICAL DECISION MAKING: Stable/uncomplicated  EVALUATION COMPLEXITY: Low   GOALS: Goals reviewed with patient? Yes  SHORT TERM GOALS: (target date for Short term goals are 3 weeks 10/06/22)   1.  Patient will demonstrate independent use of home exercise program to maintain progress from in clinic treatments.  Goal status: New  LONG TERM GOALS: (target dates for all  long term goals are 10 weeks  11/23/22 )   1. Patient will demonstrate/report pain at worst less than or equal to 2/10 to facilitate minimal limitation in daily activity secondary to pain symptoms.  Goal status: New   2. Patient will demonstrate independent use of home exercise program to facilitate ability to maintain/progress functional gains from skilled physical therapy services.  Goal status: New   3. Patient will demonstrate FOTO outcome > or = 62 % to indicate reduced disability due to condition.  Goal status: New   4.  Pt will be able to amb up and down 1 flight of stairs with single hand rail with step over step gait pattern with no pain reported.   Goal status: New   5.  Patient will be able to improve her 5 time sit to stand to </= 12 seconds with no UE support for improved functional mobility   Goal status: New   6.  Patient will be able to report able 20 minutes with no pain in bil knees or Rt ankle.   Goal status: New    PLAN:  PT FREQUENCY: 1-2x/week  PT DURATION: 10 weeks  PLANNED INTERVENTIONS: Therapeutic exercises, Therapeutic activity, Neuro Muscular re-education, Balance training, Gait training, Patient/Family education, Joint mobilization, Stair training, DME instructions, Dry Needling, Electrical stimulation, Traction, Cryotherapy, vasopneumatic deviceMoist heat, Taping, Ultrasound, Ionotophoresis '4mg'$ /ml Dexamethasone, and Manual therapy.  All included unless contraindicated  PLAN FOR NEXT SESSION:  Review HEP PRN, general LE strengthening, 4 way ankle exercises and functional mobility training   Laureen Abrahams, PT, DPT 09/19/22 1:46 PM

## 2022-09-24 ENCOUNTER — Encounter: Payer: Medicare Other | Admitting: Physical Therapy

## 2022-09-26 ENCOUNTER — Ambulatory Visit (INDEPENDENT_AMBULATORY_CARE_PROVIDER_SITE_OTHER): Payer: Medicare Other | Admitting: Physical Therapy

## 2022-09-26 ENCOUNTER — Encounter: Payer: Self-pay | Admitting: Physical Therapy

## 2022-09-26 DIAGNOSIS — R262 Difficulty in walking, not elsewhere classified: Secondary | ICD-10-CM

## 2022-09-26 DIAGNOSIS — G8929 Other chronic pain: Secondary | ICD-10-CM

## 2022-09-26 DIAGNOSIS — M25562 Pain in left knee: Secondary | ICD-10-CM | POA: Diagnosis not present

## 2022-09-26 DIAGNOSIS — M6281 Muscle weakness (generalized): Secondary | ICD-10-CM

## 2022-09-26 DIAGNOSIS — M25561 Pain in right knee: Secondary | ICD-10-CM | POA: Diagnosis not present

## 2022-09-26 DIAGNOSIS — M25571 Pain in right ankle and joints of right foot: Secondary | ICD-10-CM

## 2022-09-26 DIAGNOSIS — R600 Localized edema: Secondary | ICD-10-CM

## 2022-09-26 NOTE — Therapy (Signed)
OUTPATIENT PHYSICAL THERAPY TREATMENT NOTE   Patient Name: Jessica Roberts MRN: [id number] DOB:07/03/1946, 77 y.o., female Today's Date: 09/26/2022  END OF SESSION:   PT End of Session - 09/26/22 1339     Visit Number 3    Number of Visits 20    Date for PT Re-Evaluation 11/23/22    Progress Note Due on Visit 10    PT Start Time 1316    PT Stop Time 1339    PT Time Calculation (min) 23 min    Activity Tolerance Patient tolerated treatment well    Behavior During Therapy [facility] for tasks assessed/performed              Past Medical History:  Diagnosis Date   Allergy    Anxiety    Anxiety and depression    Breast cancer ([place]) 04/07/15   right breast   Breast cancer of upper-outer quadrant of right female breast ([place]) 04/11/2015   Broken nose    Complication of anesthesia    "had a very hard time waking up"   Dengue fever    Depression    Fall    broken nose   GERD (gastroesophageal reflux disease)    Head injury 1995   concussion in [place], med evac to [place], ear damage with vertigo   Head injury, acute, with loss of consciousness ([place]) 2000   [place] the [place]   Head injury, closed, with concussion 1986   [place]   Headache    Hypertension    Malaria    PONV (postoperative nausea and vomiting)    Typhoid    Past Surgical History:  Procedure Laterality Date   BREAST LUMPECTOMY Right 04/27/2015   BREAST LUMPECTOMY WITH NEEDLE LOCALIZATION AND AXILLARY SENTINEL LYMPH NODE BX Right 04/27/2015   Procedure: RIGHT BREAST LUMPECTOMY WITH  TWO NEEDLE LOCALIZATION AND TWO RIGHT AXILLARY SENTINEL LYMPH NODE BX;  Surgeon: Fanny Skates, MD;  Location: [place];  Service: General;  Laterality: Right;   COLONOSCOPY W/ BIOPSIES AND POLYPECTOMY     NERVE SURGERY     '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' during surgery for endometriosis   surgical procedure for endometriosis     TONSILLECTOMY     Patient Active Problem List   Diagnosis Date Noted   Early stage  nonexudative age-related macular degeneration of both eyes 11/27/2021   Choroidal nevus, right 11/27/2021   Nuclear sclerotic cataract of both eyes 11/27/2021   Iron deficiency 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 (chronic) 07/19/2020   Breast cancer of upper-outer quadrant of right female breast ([place]) 04/11/2015     THERAPY DIAG:  Chronic pain of right knee  Chronic pain of left knee  Localized edema  Difficulty in walking, not elsewhere classified  Muscle weakness (generalized)  Pain in right ankle and joints of right foot   PCP: Derinda Late, MD   REFERRING PROVIDER:  Mcarthur Rossetti, MD   REFERRING DIAG:  Diagnosis  M25.561,M25.562 (ICD-10-CM) - Pain in both knees, unspecified chronicity    EVAL THERAPY DIAG:  Chronic pain of right knee  Chronic pain of left knee  Localized edema  Difficulty in walking, not elsewhere classified  Muscle weakness (generalized)  Pain in right ankle and joints of right foot  Rationale for Evaluation and Treatment: Rehabilitation  ONSET DATE: years ago   SUBJECTIVE STATEMENT: Hasn't done much this past week; no exercises except the ankle alphabet.  Knee is a little uncomfortable, but ankle is doing well  PERTINENT HISTORY: Allergies, breast CA, broken nose, h/o fall, depression, anxiety, head injury, HTN, breast lumpectomy  PAIN:  NPRS scale: 2/10 Pain location: Rt knee and Rt ankle Pain description: achy, tightness Aggravating factors: up and down stairs, when transitioning from sit to stand Relieving factors: Tylenol, Voltaran gel  PRECAUTIONS: None  WEIGHT BEARING RESTRICTIONS: No  FALLS:  Has patient fallen in last 6 months? No  LIVING ENVIRONMENT: Lives with: lives with their family and lives alone Lives in: House/apartment Stairs: Yes: External: 4 steps; bilateral but cannot reach both Has following equipment at home: None  OCCUPATION: retired  PLOF: Independent  PATIENT GOALS: Get up and  down out of recliner with no pain or stiffness     Stop knee from "giving out" when going up steps     OBJECTIVE:   DIAGNOSTIC FINDINGS: Result Narrative from 09/05/22  2 views of the right knee show just slight medial joint space narrowing.   There is otherwise no acute findings.  PATIENT SURVEYS:  09/11/22: FOTO intake: 49   predicted:  62  MUSCLE LENGTH: 09/11/22: Hamstrings: Right 40 deg; Left 45 deg   POSTURE: rounded shoulders and forward head, decreased lumbar lordosis  PALPATION: No pin point tenderness note at eval  LOWER EXTREMITY ROM:   ROM Right 09/11/22 Left 09/11/22  Hip flexion 100 106  Hip extension    Hip abduction    Hip adduction    Hip internal rotation    Hip external rotation    Knee flexion 124 130  Knee extension 0 0  Ankle dorsiflexion 2 20  Ankle plantarflexion 18 32  Ankle inversion 34 40  Ankle eversion 30 34   (Blank rows = not tested)  LOWER EXTREMITY MMT:  MMT Right Eval 09/11/22 Left Eval 09/11/22  Hip flexion 4/5 5/5  Hip extension    Hip abduction 4/5 4+/5  Hip adduction 4/5 4+/5  Hip internal rotation    Hip external rotation    Knee flexion 5/5 5/5  Knee extension 5/5 5/5  Ankle dorsiflexion 4/5 5/5  Ankle plantarflexion 4/5 5/5  Ankle inversion 4/5 5/5  Ankle eversion 4/5 5/5   (Blank rows = not tested)    FUNCTIONAL TESTS:  09/11/22: 5 time sit to stand: 16 seconds no UE support  GAIT: Distance walked: 30 feet  Assistive device utilized: None Level of assistance: Complete Independence Comments: mild decreased heel strike in Rt LE   TODAY'S TREATMENT                                                                          DATE: 09/26/22 TherEx Bridges 20 reps x 5 sec hold Rt hamstring stretch 3x30 sec Sidelying hip circles x 10 reps bil (CW/CCW each)  09/19/22 TherEx NuStep L5 x 8 min Seated hamstring stretch on Rt 3x30 sec Seated ankle alphabet A-Z Bridges 20 reps x 5 sec hold Rt hamstring stretch  3x30 sec Standing calf raises x 10 reps Rt gastroc stretch 2x30 sec Standing hip abduction 2x10 bil with L3 band Standing hip extension 2x10 bil with L3 band   09/11/22:  Therex:    HEP instruction/performance c cues for techniques, handout provided.  Trial set performed of each for comprehension  and symptom assessment.  See below for exercise list  PATIENT EDUCATION:  Education details: HEP, POC Person educated: Patient Education method: Explanation, Demonstration, Verbal cues, and Handouts Education comprehension: verbalized understanding, returned demonstration, and verbal cues required  HOME EXERCISE PROGRAM: Access Code: [email or web address] URL: [email or web address] Date: 09/11/2022 Prepared by: Kearney Hard  Exercises - Supine Bridge  - 2 x daily - 2 sets - 10 reps - 5 seconds hold - Hooklying Hamstring Stretch with Strap  - 2 x daily - 3 reps - 30 seconds hold - Seated Hamstring Stretch  - 2 x daily - 3 reps - 30 seconds hold - Seated Ankle Alphabet  - 2 x daily - 1 reps - Heel Raises with Counter Support  - 2 x daily - 2 sets - 10 reps  ASSESSMENT:  CLINICAL IMPRESSION: Pt arrived late to session today.  Pain returning due to limited compliance with HEP. Pt plans to resume this week.  Will continue to benefit from PT to maximize function.  OBJECTIVE IMPAIRMENTS: decreased activity tolerance, difficulty walking, decreased ROM, decreased strength, and pain.   ACTIVITY LIMITATIONS: standing, squatting, stairs, and transfers  PARTICIPATION LIMITATIONS: cleaning and community activity  PERSONAL FACTORS: 3+ comorbidities: see above history are also affecting patient's functional outcome.   REHAB POTENTIAL: Good  CLINICAL DECISION MAKING: Stable/uncomplicated  EVALUATION COMPLEXITY: Low   GOALS: Goals reviewed with patient? Yes  SHORT TERM GOALS: (target date for Short term goals are 3 weeks 10/06/22)   1.  Patient will demonstrate independent use of home  exercise program to maintain progress from in clinic treatments.  Goal status: New  LONG TERM GOALS: (target dates for all long term goals are 10 weeks  11/23/22 )   1. Patient will demonstrate/report pain at worst less than or equal to 2/10 to facilitate minimal limitation in daily activity secondary to pain symptoms.  Goal status: New   2. Patient will demonstrate independent use of home exercise program to facilitate ability to maintain/progress functional gains from skilled physical therapy services.  Goal status: New   3. Patient will demonstrate FOTO outcome > or = 62 % to indicate reduced disability due to condition.  Goal status: New   4.  Pt will be able to amb up and down 1 flight of stairs with single hand rail with step over step gait pattern with no pain reported.   Goal status: New   5.  Patient will be able to improve her 5 time sit to stand to </= 12 seconds with no UE support for improved functional mobility   Goal status: New   6.  Patient will be able to report able 20 minutes with no pain in bil knees or Rt ankle.   Goal status: New    PLAN:  PT FREQUENCY: 1-2x/week  PT DURATION: 10 weeks  PLANNED INTERVENTIONS: Therapeutic exercises, Therapeutic activity, Neuro Muscular re-education, Balance training, Gait training, Patient/Family education, Joint mobilization, Stair training, DME instructions, Dry Needling, Electrical stimulation, Traction, Cryotherapy, vasopneumatic deviceMoist heat, Taping, Ultrasound, Ionotophoresis 27m/ml Dexamethasone, and Manual therapy.  All included unless contraindicated  PLAN FOR NEXT SESSION:  Review HEP PRN, general LE strengthening, 4 way ankle exercises and functional mobility training   SLaureen Abrahams PT, DPT 09/26/22 1:41 PM

## 2022-10-01 ENCOUNTER — Encounter: Payer: Self-pay | Admitting: Physical Therapy

## 2022-10-01 ENCOUNTER — Ambulatory Visit (INDEPENDENT_AMBULATORY_CARE_PROVIDER_SITE_OTHER): Payer: Medicare Other | Admitting: Physical Therapy

## 2022-10-01 DIAGNOSIS — M25562 Pain in left knee: Secondary | ICD-10-CM

## 2022-10-01 DIAGNOSIS — R262 Difficulty in walking, not elsewhere classified: Secondary | ICD-10-CM | POA: Diagnosis not present

## 2022-10-01 DIAGNOSIS — R600 Localized edema: Secondary | ICD-10-CM | POA: Diagnosis not present

## 2022-10-01 DIAGNOSIS — G8929 Other chronic pain: Secondary | ICD-10-CM

## 2022-10-01 DIAGNOSIS — M25561 Pain in right knee: Secondary | ICD-10-CM

## 2022-10-01 DIAGNOSIS — M25571 Pain in right ankle and joints of right foot: Secondary | ICD-10-CM

## 2022-10-01 DIAGNOSIS — M6281 Muscle weakness (generalized): Secondary | ICD-10-CM

## 2022-10-01 NOTE — Therapy (Signed)
OUTPATIENT PHYSICAL THERAPY TREATMENT NOTE   Jessica Roberts Name: Jessica Roberts MRN: [id number] DOB:1946/04/03, 77 y.o., female Today's Date: 10/01/2022  END OF SESSION:   Jessica Roberts End of Session - 10/01/22 1027     Visit Number 4    Number of Visits 20    Date for Jessica Roberts Re-Evaluation 11/23/22    Progress Note Due on Visit 10    Jessica Roberts Start Time 1018    Jessica Roberts Stop Time 1058    Jessica Roberts Time Calculation (min) 40 min    Activity Tolerance Jessica Roberts tolerated treatment well    Behavior During Therapy [name] for tasks assessed/performed              Past Medical History:  Diagnosis Date   Allergy    Anxiety    Anxiety and depression    Breast cancer ([place]) 04/07/15   right breast   Breast cancer of upper-outer quadrant of right female breast ([place]) 04/11/2015   Broken nose    Complication of anesthesia    "had a very hard time waking up"   Dengue fever    Depression    Fall    broken nose   GERD (gastroesophageal reflux disease)    Head injury 1995   concussion in [place], med evac to [place], ear damage with vertigo   Head injury, acute, with loss of consciousness ([place]) 2000   [place]   Head injury, closed, with concussion 1986   [place]   Headache    Hypertension    Malaria    PONV (postoperative nausea and vomiting)    Typhoid    Past Surgical History:  Procedure Laterality Date   BREAST LUMPECTOMY Right 04/27/2015   BREAST LUMPECTOMY WITH NEEDLE LOCALIZATION AND AXILLARY SENTINEL LYMPH NODE BX Right 04/27/2015   Procedure: RIGHT BREAST LUMPECTOMY WITH  TWO NEEDLE LOCALIZATION AND TWO RIGHT AXILLARY SENTINEL LYMPH NODE BX;  Surgeon: Fanny Skates, MD;  Location: [place];  Service: General;  Laterality: Right;   COLONOSCOPY W/ BIOPSIES AND POLYPECTOMY     NERVE SURGERY     '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' during surgery for endometriosis   surgical procedure for endometriosis     TONSILLECTOMY     Jessica Roberts Active Problem List   Diagnosis Date Noted   Early stage  nonexudative age-related macular degeneration of both eyes 11/27/2021   Choroidal nevus, right 11/27/2021   Nuclear sclerotic cataract of both eyes 11/27/2021   Iron deficiency 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 (chronic) 07/19/2020   Breast cancer of upper-outer quadrant of right female breast ([place]) 04/11/2015     THERAPY DIAG:  Chronic pain of right knee  Chronic pain of left knee  Localized edema  Difficulty in walking, not elsewhere classified  Muscle weakness (generalized)  Pain in right ankle and joints of right foot   PCP: Jessica Late, MD   REFERRING PROVIDER:  Mcarthur Rossetti, MD   REFERRING DIAG:  Diagnosis  M25.561,M25.562 (ICD-10-CM) - Pain in both knees, unspecified chronicity    EVAL THERAPY DIAG:  Chronic pain of right knee  Chronic pain of left knee  Localized edema  Difficulty in walking, not elsewhere classified  Muscle weakness (generalized)  Pain in right ankle and joints of right foot  Rationale for Evaluation and Treatment: Rehabilitation  ONSET DATE: years ago   SUBJECTIVE STATEMENT: Hasn't done much this past week; no exercises except the ankle alphabet.  Knee is a little uncomfortable, but ankle is doing well  PERTINENT HISTORY: Allergies, breast CA, broken nose, h/o fall, depression, anxiety, head injury, HTN, breast lumpectomy  PAIN:  NPRS scale: 2/10 Pain location: Rt knee and Rt ankle Pain description: achy, tightness Aggravating factors: up and down stairs, when transitioning from sit to stand Relieving factors: Tylenol, Voltaran gel  PRECAUTIONS: None  WEIGHT BEARING RESTRICTIONS: No  FALLS:  Has Jessica Roberts fallen in last 6 months? No  LIVING ENVIRONMENT: Lives with: lives with their family and lives alone Lives in: House/apartment Stairs: Yes: External: 4 steps; bilateral but cannot reach both Has following equipment at home: None  OCCUPATION: retired  PLOF: Independent  Jessica Roberts GOALS: Get up and  down out of recliner with no pain or stiffness     Stop knee from "giving out" when going up steps     OBJECTIVE:   DIAGNOSTIC FINDINGS: Result Narrative from 09/05/22  2 views of the right knee show just slight medial joint space narrowing.   There is otherwise no acute findings.  Jessica Roberts SURVEYS:  09/11/22: FOTO intake: 49   predicted:  62  MUSCLE LENGTH: 09/11/22: Hamstrings: Right 40 deg; Left 45 deg   POSTURE: rounded shoulders and forward head, decreased lumbar lordosis  PALPATION: No pin point tenderness note at eval  LOWER EXTREMITY ROM:   ROM Right 09/11/22 Left 09/11/22  Hip flexion 100 106  Hip extension    Hip abduction    Hip adduction    Hip internal rotation    Hip external rotation    Knee flexion 124 130  Knee extension 0 0  Ankle dorsiflexion 2 20  Ankle plantarflexion 18 32  Ankle inversion 34 40  Ankle eversion 30 34   (Blank rows = not tested)  LOWER EXTREMITY MMT:  MMT Right Eval 09/11/22 Left Eval 09/11/22  Hip flexion 4/5 5/5  Hip extension    Hip abduction 4/5 4+/5  Hip adduction 4/5 4+/5  Hip internal rotation    Hip external rotation    Knee flexion 5/5 5/5  Knee extension 5/5 5/5  Ankle dorsiflexion 4/5 5/5  Ankle plantarflexion 4/5 5/5  Ankle inversion 4/5 5/5  Ankle eversion 4/5 5/5   (Blank rows = not tested)    FUNCTIONAL TESTS:  09/11/22: 5 time sit to stand: 16 seconds no UE support  GAIT: Distance walked: 30 feet  Assistive device utilized: None Level of assistance: Complete Independence Comments: mild decreased heel strike in Rt LE   TODAY'S TREATMENT                                                                          DATE: 10/01/22:  Lianne Moris:  Nustep: Level 5 x 12 minutes for strength and endurance Toe and Heel raises x 20 c UE support Lateral step ups x 10 each direction c UE support Standing hamstring curls x 10 each LE 4 way ankle exercises x 10 each direction using Level 2 band Prone: hip  extension x 10 each LE Bridges x 10 holding 5 seconds Rt hamstring stretch x 2 holding 30 sec    09/26/22 TherEx Bridges 20 reps x 5 sec hold Rt hamstring stretch 3x30 sec Sidelying hip circles x 10 reps bil (CW/CCW each)  09/19/22 TherEx NuStep L5 x  8 min Seated hamstring stretch on Rt 3x30 sec Seated ankle alphabet A-Z Bridges 20 reps x 5 sec hold Rt hamstring stretch 3x30 sec Standing calf raises x 10 reps Rt gastroc stretch 2x30 sec Standing hip abduction 2x10 bil with L3 band Standing hip extension 2x10 bil with L3 band     Jessica Roberts EDUCATION:  Education details: HEP, POC Person educated: Jessica Roberts Education method: Consulting civil engineer, Media planner, Verbal cues, and Handouts Education comprehension: verbalized understanding, returned demonstration, and verbal cues required  HOME EXERCISE PROGRAM: Access Code: [email or web address] URL: [email or web address] Date: 10/01/2022 Prepared by: Kearney Hard  Exercises - Supine Bridge  - 2 x daily - 2 sets - 10 reps - 5 seconds hold - Hooklying Hamstring Stretch with Strap  - 2 x daily - 3 reps - 30 seconds hold - Seated Hamstring Stretch  - 2 x daily - 3 reps - 30 seconds hold - Seated Ankle Alphabet  - 2 x daily - 1 reps - Heel Raises with Counter Support  - 2 x daily - 2 sets - 10 reps - Ankle Dorsiflexion with Resistance  - 1-2 x daily - 7 x weekly - 2 sets - 10 reps - Ankle Inversion with Resistance  - 1-2 x daily - 7 x weekly - 2 sets - 10 reps - Ankle Eversion with Resistance  - 1-2 x daily - 7 x weekly - 2 sets - 10 reps - Ankle and Toe Plantarflexion with Resistance  - 1-2 x daily - 7 x weekly - 2 sets - 10 reps  ASSESSMENT:  CLINICAL IMPRESSION: Jessica Roberts arriving today with 3/10 pain in her Rt medial knee. Jessica Roberts stating she believes the sit to stand with thera-band around her knees may have aggravated her pain. Jessica Roberts tolerating all exercises well this visit. Continue with skilled Jessica Roberts to maximize Jessica Roberts's function.   OBJECTIVE  IMPAIRMENTS: decreased activity tolerance, difficulty walking, decreased ROM, decreased strength, and pain.   ACTIVITY LIMITATIONS: standing, squatting, stairs, and transfers  PARTICIPATION LIMITATIONS: cleaning and community activity  PERSONAL FACTORS: 3+ comorbidities: see above history are also affecting Jessica Roberts's functional outcome.   REHAB POTENTIAL: Good  CLINICAL DECISION MAKING: Stable/uncomplicated  EVALUATION COMPLEXITY: Low   GOALS: Goals reviewed with Jessica Roberts? Yes  SHORT TERM GOALS: (target date for Short term goals are 3 weeks 10/06/22)   1.  Jessica Roberts will demonstrate independent use of home exercise program to maintain progress from in clinic treatments.  Goal status: Ongoing 10/01/22  LONG TERM GOALS: (target dates for all long term goals are 10 weeks  11/23/22 )   1. Jessica Roberts will demonstrate/report pain at worst less than or equal to 2/10 to facilitate minimal limitation in daily activity secondary to pain symptoms.  Goal status: New   2. Jessica Roberts will demonstrate independent use of home exercise program to facilitate ability to maintain/progress functional gains from skilled physical therapy services.  Goal status: New   3. Jessica Roberts will demonstrate FOTO outcome > or = 62 % to indicate reduced disability due to condition.  Goal status: New   4.  Jessica Roberts will be able to amb up and down 1 flight of stairs with single hand rail with step over step gait pattern with no pain reported.   Goal status: New   5.  Jessica Roberts will be able to improve her 5 time sit to stand to </= 12 seconds with no UE support for improved functional mobility   Goal status: New   6.  Jessica Roberts will be able to report able 20  minutes with no pain in bil knees or Rt ankle.   Goal status: New    PLAN:  Jessica Roberts FREQUENCY: 1-2x/week  Jessica Roberts DURATION: 10 weeks  PLANNED INTERVENTIONS: Therapeutic exercises, Therapeutic activity, Neuro Muscular re-education, Balance training, Gait training, Jessica Roberts/Family  education, Joint mobilization, Stair training, DME instructions, Dry Needling, Electrical stimulation, Traction, Cryotherapy, vasopneumatic deviceMoist heat, Taping, Ultrasound, Ionotophoresis 56m/ml Dexamethasone, and Manual therapy.  All included unless contraindicated  PLAN FOR NEXT SESSION:  general LE strengthening, and functional mobility training, ankle stability   JKearney Hard Jessica Roberts, MPT 10/01/22 10:35 AM   10/01/22 10:35 AM

## 2022-10-03 ENCOUNTER — Encounter: Payer: Medicare Other | Admitting: Physical Therapy

## 2022-10-08 ENCOUNTER — Encounter: Payer: Medicare Other | Admitting: Physical Therapy

## 2022-10-08 ENCOUNTER — Telehealth: Payer: Self-pay | Admitting: Physical Therapy

## 2022-10-08 NOTE — Telephone Encounter (Signed)
I called pt to follow up after she missed her 11:45 PT appointment. I left a message reminding pt of her upcoming appointment next Monday 10/15/22 at 11:45.   Kearney Hard, PT, MPT 10/08/22 12:14 PM

## 2022-10-09 ENCOUNTER — Ambulatory Visit: Payer: Medicare Other | Admitting: Orthopaedic Surgery

## 2022-10-10 ENCOUNTER — Encounter: Payer: Medicare Other | Admitting: Physical Therapy

## 2022-10-11 ENCOUNTER — Encounter: Payer: Medicare Other | Admitting: Physical Therapy

## 2022-10-15 ENCOUNTER — Ambulatory Visit: Payer: Medicare Other | Admitting: Physical Therapy

## 2022-10-15 ENCOUNTER — Encounter: Payer: Self-pay | Admitting: Physical Therapy

## 2022-10-15 DIAGNOSIS — M25562 Pain in left knee: Secondary | ICD-10-CM | POA: Diagnosis not present

## 2022-10-15 DIAGNOSIS — M25571 Pain in right ankle and joints of right foot: Secondary | ICD-10-CM

## 2022-10-15 DIAGNOSIS — R262 Difficulty in walking, not elsewhere classified: Secondary | ICD-10-CM | POA: Diagnosis not present

## 2022-10-15 DIAGNOSIS — M25561 Pain in right knee: Secondary | ICD-10-CM

## 2022-10-15 DIAGNOSIS — R600 Localized edema: Secondary | ICD-10-CM | POA: Diagnosis not present

## 2022-10-15 DIAGNOSIS — M6281 Muscle weakness (generalized): Secondary | ICD-10-CM

## 2022-10-15 DIAGNOSIS — G8929 Other chronic pain: Secondary | ICD-10-CM

## 2022-10-15 NOTE — Therapy (Signed)
OUTPATIENT PHYSICAL THERAPY TREATMENT NOTE   Patient Name: Jessica Roberts MRN: [id number] DOB:1946-03-31, 77 y.o., female Today's Date: 10/15/2022  END OF SESSION:   PT End of Session - 10/15/22 1154     Visit Number 5    Number of Visits 20    Date for PT Re-Evaluation 11/23/22    Progress Note Due on Visit 10    PT Start Time 1152    PT Stop Time 1230    PT Time Calculation (min) 38 min    Activity Tolerance Patient tolerated treatment well    Behavior During Therapy [facility] for tasks assessed/performed              Past Medical History:  Diagnosis Date   Allergy    Anxiety    Anxiety and depression    Breast cancer ([place]) 04/07/15   right breast   Breast cancer of upper-outer quadrant of right female breast ([place]) 04/11/2015   Broken nose    Complication of anesthesia    "had a very hard time waking up"   Dengue fever    Depression    Fall    broken nose   GERD (gastroesophageal reflux disease)    Head injury 1995   concussion in [place], med evac to [place], ear damage with vertigo   Head injury, acute, with loss of consciousness ([place]) [place]   [place]   Head [place], closed, with concussion 1986   [place]   Headache    Hypertension    Malaria    PONV (postoperative nausea and vomiting)    Typhoid    Past Surgical History:  Procedure Laterality Date   BREAST LUMPECTOMY Right 04/27/2015   BREAST LUMPECTOMY WITH NEEDLE LOCALIZATION AND AXILLARY SENTINEL LYMPH NODE BX Right 04/27/2015   Procedure: RIGHT BREAST LUMPECTOMY WITH  TWO NEEDLE LOCALIZATION AND TWO RIGHT AXILLARY SENTINEL LYMPH NODE BX;  Surgeon: Fanny Skates, MD;  Location: [place];  Service: General;  Laterality: Right;   COLONOSCOPY W/ BIOPSIES AND POLYPECTOMY     NERVE SURGERY     '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' during surgery for endometriosis   surgical procedure for endometriosis     TONSILLECTOMY     Patient Active Problem List   Diagnosis Date Noted   Early stage  nonexudative age-related macular degeneration of both eyes 11/27/2021   Choroidal nevus, right 11/27/2021   Nuclear sclerotic cataract of both eyes 11/27/2021   Iron deficiency 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 (chronic) 07/19/2020   Breast cancer of upper-outer quadrant of right female breast ([place]) 04/11/2015     THERAPY DIAG:  Chronic pain of right knee  Chronic pain of left knee  Localized edema  Difficulty in walking, not elsewhere classified  Pain in right ankle and joints of right foot  Muscle weakness (generalized)   PCP: Derinda Late, MD   REFERRING PROVIDER:  Mcarthur Rossetti, MD   REFERRING DIAG:  Diagnosis  M25.561,M25.562 (ICD-10-CM) - Pain in both knees, unspecified chronicity    EVAL THERAPY DIAG:  Chronic pain of right knee  Chronic pain of left knee  Localized edema  Difficulty in walking, not elsewhere classified  Muscle weakness (generalized)  Pain in right ankle and joints of right foot  Rationale for Evaluation and Treatment: Rehabilitation  ONSET DATE: years ago   SUBJECTIVE STATEMENT: Pt arriving reporting going to water aerobic for the first time this morning. Pt reporting 2/10 pain in Rt knee and 3/10 pain in the  Rt ankle.    PERTINENT HISTORY: Allergies, breast CA, broken nose, h/o fall, depression, anxiety, head injury, HTN, breast lumpectomy  PAIN:  NPRS scale/location: 2/10 in Rt knee, 3/10 in Rt ankle Pain description: achy, tightness Aggravating factors: up and down stairs, when transitioning from sit to stand Relieving factors: Tylenol, Voltaran gel  PRECAUTIONS: None  WEIGHT BEARING RESTRICTIONS: No  FALLS:  Has patient fallen in last 6 months? No  LIVING ENVIRONMENT: Lives with: lives with their family and lives alone Lives in: House/apartment Stairs: Yes: External: 4 steps; bilateral but cannot reach both Has following equipment at home: None  OCCUPATION: retired  PLOF: Independent  PATIENT GOALS:  Get up and down out of recliner with no pain or stiffness     Stop knee from "giving out" when going up steps     OBJECTIVE:   DIAGNOSTIC FINDINGS: Result Narrative from 09/05/22  2 views of the right knee show just slight medial joint space narrowing.   There is otherwise no acute findings.  PATIENT SURVEYS:  09/11/22: FOTO intake: 49   predicted:  62  MUSCLE LENGTH: 09/11/22: Hamstrings: Right 40 deg; Left 45 deg   POSTURE: rounded shoulders and forward head, decreased lumbar lordosis  PALPATION: No pin point tenderness note at eval  LOWER EXTREMITY ROM:   ROM Right 09/11/22 Left 09/11/22 Rt 10/15/22  Hip flexion 100 106   Hip extension     Hip abduction     Hip adduction     Hip internal rotation     Hip external rotation     Knee flexion 124 130   Knee extension 0 0   Ankle dorsiflexion 2 20 A: 6 P: 10  Ankle plantarflexion 18 32 A: 30 P: 35  Ankle inversion 34 40 A: 42 P: 42  Ankle eversion 30 34 A: 30 P: 45   (Blank rows = not tested)  LOWER EXTREMITY MMT:  MMT Right Eval 09/11/22 Left Eval 09/11/22  Hip flexion 4/5 5/5  Hip extension    Hip abduction 4/5 4+/5  Hip adduction 4/5 4+/5  Hip internal rotation    Hip external rotation    Knee flexion 5/5 5/5  Knee extension 5/5 5/5  Ankle dorsiflexion 4/5 5/5  Ankle plantarflexion 4/5 5/5  Ankle inversion 4/5 5/5  Ankle eversion 4/5 5/5   (Blank rows = not tested)    FUNCTIONAL TESTS:  09/11/22: 5 time sit to stand: 16 seconds no UE support  GAIT: Distance walked: 30 feet  Assistive device utilized: None Level of assistance: Complete Independence Comments: mild decreased heel strike in Rt LE   TODAY'S TREATMENT                                                                          DATE: 10/15/22:  TherEx:  Recumbent bike:  L3 x 6 minutes for strength and endurance Toe and Heel raises x 20 c UE support Cone tapping placed in front of each LE x 20 c single UE support Lateral step ups x 10  each direction c UE support Mini squats in parallel bars c UE support x 10 Standing hamstring curls x 10 each LE c UE support BAPS L2 standing: x 1 minute  each direction c UE support Rocker board: fitter first 2 pivot points: (df/pf) 1 minute Standing: hip extension x 10 each LE, Level 2 band Bridges x 10 holding 5 seconds Rt hamstring stretch x 2 holding 30 sec     10/01/22:  TherEx:  Nustep: Level 5 x 12 minutes for strength and endurance Toe and Heel raises x 20 c UE support Lateral step ups x 10 each direction c UE support Standing hamstring curls x 10 each LE 4 way ankle exercises x 10 each direction using Level 2 band Prone: hip extension x 10 each LE Bridges x 10 holding 5 seconds Rt hamstring stretch x 2 holding 30 sec    09/26/22 TherEx Bridges 20 reps x 5 sec hold Rt hamstring stretch 3x30 sec Sidelying hip circles x 10 reps bil (CW/CCW each)       PATIENT EDUCATION:  Education details: HEP, POC Person educated: Patient Education method: Consulting civil engineer, Media planner, Verbal cues, and Handouts Education comprehension: verbalized understanding, returned demonstration, and verbal cues required  HOME EXERCISE PROGRAM: Access Code: [email or web address] URL: [email or web address] Date: 10/01/2022 Prepared by: Kearney Hard  Exercises - Supine Bridge  - 2 x daily - 2 sets - 10 reps - 5 seconds hold - Hooklying Hamstring Stretch with Strap  - 2 x daily - 3 reps - 30 seconds hold - Seated Hamstring Stretch  - 2 x daily - 3 reps - 30 seconds hold - Seated Ankle Alphabet  - 2 x daily - 1 reps - Heel Raises with Counter Support  - 2 x daily - 2 sets - 10 reps - Ankle Dorsiflexion with Resistance  - 1-2 x daily - 7 x weekly - 2 sets - 10 reps - Ankle Inversion with Resistance  - 1-2 x daily - 7 x weekly - 2 sets - 10 reps - Ankle Eversion with Resistance  - 1-2 x daily - 7 x weekly - 2 sets - 10 reps - Ankle and Toe Plantarflexion with Resistance  - 1-2 x daily -  7 x weekly - 2 sets - 10 reps  ASSESSMENT:  CLINICAL IMPRESSION:  Pt tolerating all exercises for ROM and strengthening well for her Rt knee and Rt ankle. Pt stating she began water aerobics this morning.  We discussed options for exercises in the pool to allow pt to build up strength and knee stability at a low level than what the instructor was presenting. Pt's active and passive right ankle ROM has improved since her initial evaluation. Continue with skilled PT to maximize pt's function.   OBJECTIVE IMPAIRMENTS: decreased activity tolerance, difficulty walking, decreased ROM, decreased strength, and pain.   ACTIVITY LIMITATIONS: standing, squatting, stairs, and transfers  PARTICIPATION LIMITATIONS: cleaning and community activity  PERSONAL FACTORS: 3+ comorbidities: see above history are also affecting patient's functional outcome.   REHAB POTENTIAL: Good  CLINICAL DECISION MAKING: Stable/uncomplicated  EVALUATION COMPLEXITY: Low   GOALS: Goals reviewed with patient? Yes  SHORT TERM GOALS: (target date for Short term goals are 3 weeks 10/06/22)   1.  Patient will demonstrate independent use of home exercise program to maintain progress from in clinic treatments.  Goal status: MET 10/15/22  LONG TERM GOALS: (target dates for all long term goals are 10 weeks  11/23/22 )   1. Patient will demonstrate/report pain at worst less than or equal to 2/10 to facilitate minimal limitation in daily activity secondary to pain symptoms.  Goal status: On-going 10/15/22   2. Patient will demonstrate independent use of  home exercise program to facilitate ability to maintain/progress functional gains from skilled physical therapy services.  Goal status: New   3. Patient will demonstrate FOTO outcome > or = 62 % to indicate reduced disability due to condition.  Goal status: New   4.  Pt will be able to amb up and down 1 flight of stairs with single hand rail with step over step gait pattern  with no pain reported.   Goal status: On-going 10/15/22   5.  Patient will be able to improve her 5 time sit to stand to </= 12 seconds with no UE support for improved functional mobility   Goal status: New   6.  Patient will be able to report able 20 minutes with no pain in bil knees or Rt ankle.   Goal status: On-going 10/15/22    PLAN:  PT FREQUENCY: 1-2x/week  PT DURATION: 10 weeks  PLANNED INTERVENTIONS: Therapeutic exercises, Therapeutic activity, Neuro Muscular re-education, Balance training, Gait training, Patient/Family education, Joint mobilization, Stair training, DME instructions, Dry Needling, Electrical stimulation, Traction, Cryotherapy, vasopneumatic deviceMoist heat, Taping, Ultrasound, Ionotophoresis '4mg'$ /ml Dexamethasone, and Manual therapy.  All included unless contraindicated  PLAN FOR NEXT SESSION:  general LE strengthening, and functional mobility training, ankle stability   Kearney Hard, PT, MPT 10/15/22 11:55 AM   10/15/22 11:55 AM

## 2022-10-16 ENCOUNTER — Encounter: Payer: Medicare Other | Admitting: Physical Therapy

## 2022-10-17 ENCOUNTER — Encounter: Payer: Self-pay | Admitting: Physical Therapy

## 2022-10-17 ENCOUNTER — Ambulatory Visit: Payer: Medicare Other | Admitting: Physical Therapy

## 2022-10-17 DIAGNOSIS — M25561 Pain in right knee: Secondary | ICD-10-CM

## 2022-10-17 DIAGNOSIS — R262 Difficulty in walking, not elsewhere classified: Secondary | ICD-10-CM

## 2022-10-17 DIAGNOSIS — M25562 Pain in left knee: Secondary | ICD-10-CM | POA: Diagnosis not present

## 2022-10-17 DIAGNOSIS — R600 Localized edema: Secondary | ICD-10-CM

## 2022-10-17 DIAGNOSIS — M6281 Muscle weakness (generalized): Secondary | ICD-10-CM

## 2022-10-17 DIAGNOSIS — M25571 Pain in right ankle and joints of right foot: Secondary | ICD-10-CM

## 2022-10-17 DIAGNOSIS — G8929 Other chronic pain: Secondary | ICD-10-CM

## 2022-10-17 DIAGNOSIS — E119 Type 2 diabetes mellitus without complications: Secondary | ICD-10-CM | POA: Diagnosis not present

## 2022-10-17 DIAGNOSIS — E782 Mixed hyperlipidemia: Secondary | ICD-10-CM | POA: Diagnosis not present

## 2022-10-17 NOTE — Therapy (Signed)
OUTPATIENT PHYSICAL THERAPY TREATMENT NOTE   Patient Name: Jessica Roberts MRN: [id number] DOB:06/05/1946, 77 y.o., female Today's Date: 10/17/2022  END OF SESSION:   PT End of Session - 10/17/22 1028     Visit Number 6    Number of Visits 20    Date for PT Re-Evaluation 11/23/22    Progress Note Due on Visit 10    PT Start Time 1024    PT Stop Time 1059    PT Time Calculation (min) 35 min    Activity Tolerance Patient tolerated treatment well    Behavior During Therapy [facility] for tasks assessed/performed               Past Medical History:  Diagnosis Date   Allergy    Anxiety    Anxiety and depression    Breast cancer ([place]) 04/07/15   right breast   Breast cancer of upper-outer quadrant of right female breast ([place]) 04/11/2015   Broken nose    Complication of anesthesia    "had a very hard time waking up"   Dengue fever    Depression    Fall    broken nose   GERD (gastroesophageal reflux disease)    Head injury 1995   concussion in [place], med evac to [place], ear damage with vertigo   Head injury, acute, with loss of consciousness ([place]) [place]   [place]   Head injury, closed, with concussion 1986   [place]   Headache    Hypertension    Malaria    PONV (postoperative nausea and vomiting)    Typhoid    Past Surgical History:  Procedure Laterality Date   BREAST LUMPECTOMY Right 04/27/2015   BREAST LUMPECTOMY WITH NEEDLE LOCALIZATION AND AXILLARY SENTINEL LYMPH NODE BX Right 04/27/2015   Procedure: RIGHT BREAST LUMPECTOMY WITH  TWO NEEDLE LOCALIZATION AND TWO RIGHT AXILLARY SENTINEL LYMPH NODE BX;  Surgeon: Fanny Skates, MD;  Location: [place];  Service: General;  Laterality: Right;   COLONOSCOPY W/ BIOPSIES AND POLYPECTOMY     NERVE SURGERY     '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' during surgery for endometriosis   surgical procedure for endometriosis     TONSILLECTOMY     Patient Active Problem List   Diagnosis Date Noted   Early stage  nonexudative age-related macular degeneration of both eyes 11/27/2021   Choroidal nevus, right 11/27/2021   Nuclear sclerotic cataract of both eyes 11/27/2021   Iron deficiency 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 (chronic) 07/19/2020   Breast cancer of upper-outer quadrant of right female breast ([place]) 04/11/2015     THERAPY DIAG:  Chronic pain of right knee  Chronic pain of left knee  Localized edema  Difficulty in walking, not elsewhere classified  Pain in right ankle and joints of right foot  Muscle weakness (generalized)   PCP: Derinda Late, MD   REFERRING PROVIDER:  Mcarthur Rossetti, MD   REFERRING DIAG:  Diagnosis  M25.561,M25.562 (ICD-10-CM) - Pain in both knees, unspecified chronicity    EVAL THERAPY DIAG:  Chronic pain of right knee  Chronic pain of left knee  Localized edema  Difficulty in walking, not elsewhere classified  Muscle weakness (generalized)  Pain in right ankle and joints of right foot  Rationale for Evaluation and Treatment: Rehabilitation  ONSET DATE: years ago   SUBJECTIVE STATEMENT: Still having pain in the knee; taking a water aerobics class.  Feels like it's going well.     PERTINENT HISTORY: Allergies,  breast CA, broken nose, h/o fall, depression, anxiety, head injury, HTN, breast lumpectomy  PAIN:  NPRS scale/location: 2/10 in Rt knee Pain description: achy, tightness Aggravating factors: up and down stairs, when transitioning from sit to stand Relieving factors: Tylenol, Voltaran gel  PRECAUTIONS: None  WEIGHT BEARING RESTRICTIONS: No  FALLS:  Has patient fallen in last 6 months? No  LIVING ENVIRONMENT: Lives with: lives with their family and lives alone Lives in: House/apartment Stairs: Yes: External: 4 steps; bilateral but cannot reach both Has following equipment at home: None  OCCUPATION: retired  PLOF: Independent  PATIENT GOALS: Get up and down out of recliner with no pain or stiffness     Stop  knee from "giving out" when going up steps   OBJECTIVE:   DIAGNOSTIC FINDINGS: Result Narrative from 09/05/22  2 views of the right knee show just slight medial joint space narrowing.   There is otherwise no acute findings.  PATIENT SURVEYS:  09/11/22: FOTO intake: 49   predicted:  62  MUSCLE LENGTH: 09/11/22: Hamstrings: Right 40 deg; Left 45 deg   POSTURE: rounded shoulders and forward head, decreased lumbar lordosis  PALPATION: No pin point tenderness note at eval  LOWER EXTREMITY ROM:   ROM Right 09/11/22 Left 09/11/22 Rt 10/15/22  Hip flexion 100 106   Hip extension     Hip abduction     Hip adduction     Hip internal rotation     Hip external rotation     Knee flexion 124 130   Knee extension 0 0   Ankle dorsiflexion 2 20 A: 6 P: 10  Ankle plantarflexion 18 32 A: 30 P: 35  Ankle inversion 34 40 A: 42 P: 42  Ankle eversion 30 34 A: 30 P: 45   (Blank rows = not tested)  LOWER EXTREMITY MMT:  MMT Right Eval 09/11/22 Left Eval 09/11/22  Hip flexion 4/5 5/5  Hip extension    Hip abduction 4/5 4+/5  Hip adduction 4/5 4+/5  Hip internal rotation    Hip external rotation    Knee flexion 5/5 5/5  Knee extension 5/5 5/5  Ankle dorsiflexion 4/5 5/5  Ankle plantarflexion 4/5 5/5  Ankle inversion 4/5 5/5  Ankle eversion 4/5 5/5   (Blank rows = not tested)    FUNCTIONAL TESTS:  09/11/22: 5 time sit to stand: 16 seconds no UE support  GAIT: Distance walked: 30 feet  Assistive device utilized: None Level of assistance: Complete Independence Comments: mild decreased heel strike in Rt LE   TODAY'S TREATMENT                                                                          DATE: 10/17/22 TherEx Recumbent bike L3 x 6 min Leg Press bil 75# 2x15; single leg 25# 2x15 bil Rocker board: fitter first 2 pivot points: (df/pf) 1 minute; decreasing UE support as able Heel taps from 4" block with light UE support 2x10 bil Bridges x20 reps Single limb clamshell  2x10 bil; L4 band  10/15/22:  TherEx:  Recumbent bike:  L3 x 6 minutes for strength and endurance Toe and Heel raises x 20 c UE support Cone tapping placed in front of each LE x  20 c single UE support Lateral step ups x 10 each direction c UE support Mini squats in parallel bars c UE support x 10 Standing hamstring curls x 10 each LE c UE support BAPS L2 standing: x 1 minute each direction c UE support Rocker board: fitter first 2 pivot points: (df/pf) 1 minute Standing: hip extension x 10 each LE, Level 2 band Bridges x 10 holding 5 seconds Rt hamstring stretch x 2 holding 30 sec   10/01/22:  TherEx:  Nustep: Level 5 x 12 minutes for strength and endurance Toe and Heel raises x 20 c UE support Lateral step ups x 10 each direction c UE support Standing hamstring curls x 10 each LE 4 way ankle exercises x 10 each direction using Level 2 band Prone: hip extension x 10 each LE Bridges x 10 holding 5 seconds Rt hamstring stretch x 2 holding 30 sec   PATIENT EDUCATION:  Education details: HEP, POC Person educated: Patient Education method: Consulting civil engineer, Media planner, Verbal cues, and Handouts Education comprehension: verbalized understanding, returned demonstration, and verbal cues required  HOME EXERCISE PROGRAM: Access Code: [email or web address] URL: [email or web address] Date: 10/01/2022 Prepared by: Kearney Hard  Exercises - Supine Bridge  - 2 x daily - 2 sets - 10 reps - 5 seconds hold - Hooklying Hamstring Stretch with Strap  - 2 x daily - 3 reps - 30 seconds hold - Seated Hamstring Stretch  - 2 x daily - 3 reps - 30 seconds hold - Seated Ankle Alphabet  - 2 x daily - 1 reps - Heel Raises with Counter Support  - 2 x daily - 2 sets - 10 reps - Ankle Dorsiflexion with Resistance  - 1-2 x daily - 7 x weekly - 2 sets - 10 reps - Ankle Inversion with Resistance  - 1-2 x daily - 7 x weekly - 2 sets - 10 reps - Ankle Eversion with Resistance  - 1-2 x daily - 7 x weekly -  2 sets - 10 reps - Ankle and Toe Plantarflexion with Resistance  - 1-2 x daily - 7 x weekly - 2 sets - 10 reps  ASSESSMENT:  CLINICAL IMPRESSION: Pt tolerated session well today, and is consistently attending water aerobics 3x/wk.  Will continue to benefit from PT to maximize function.  OBJECTIVE IMPAIRMENTS: decreased activity tolerance, difficulty walking, decreased ROM, decreased strength, and pain.   ACTIVITY LIMITATIONS: standing, squatting, stairs, and transfers  PARTICIPATION LIMITATIONS: cleaning and community activity  PERSONAL FACTORS: 3+ comorbidities: see above history are also affecting patient's functional outcome.   REHAB POTENTIAL: Good  CLINICAL DECISION MAKING: Stable/uncomplicated  EVALUATION COMPLEXITY: Low   GOALS: Goals reviewed with patient? Yes  SHORT TERM GOALS: (target date for Short term goals are 3 weeks 10/06/22)   1.  Patient will demonstrate independent use of home exercise program to maintain progress from in clinic treatments.  Goal status: MET 10/15/22  LONG TERM GOALS: (target dates for all long term goals are 10 weeks  11/23/22 )   1. Patient will demonstrate/report pain at worst less than or equal to 2/10 to facilitate minimal limitation in daily activity secondary to pain symptoms.  Goal status: On-going 10/15/22   2. Patient will demonstrate independent use of home exercise program to facilitate ability to maintain/progress functional gains from skilled physical therapy services.  Goal status: New   3. Patient will demonstrate FOTO outcome > or = 62 % to indicate reduced disability due to condition.  Goal status: New  4.  Pt will be able to amb up and down 1 flight of stairs with single hand rail with step over step gait pattern with no pain reported.   Goal status: On-going 10/15/22   5.  Patient will be able to improve her 5 time sit to stand to </= 12 seconds with no UE support for improved functional mobility   Goal status: New    6.  Patient will be able to report able 20 minutes with no pain in bil knees or Rt ankle.   Goal status: On-going 10/15/22    PLAN:  PT FREQUENCY: 1-2x/week  PT DURATION: 10 weeks  PLANNED INTERVENTIONS: Therapeutic exercises, Therapeutic activity, Neuro Muscular re-education, Balance training, Gait training, Patient/Family education, Joint mobilization, Stair training, DME instructions, Dry Needling, Electrical stimulation, Traction, Cryotherapy, vasopneumatic deviceMoist heat, Taping, Ultrasound, Ionotophoresis '4mg'$ /ml Dexamethasone, and Manual therapy.  All included unless contraindicated  PLAN FOR NEXT SESSION:  general LE strengthening, and functional mobility training, ankle stability   Laureen Abrahams, PT, DPT 10/17/22 12:10 PM

## 2022-10-18 ENCOUNTER — Encounter: Payer: Medicare Other | Admitting: Physical Therapy

## 2022-10-23 ENCOUNTER — Encounter: Payer: Self-pay | Admitting: Physical Therapy

## 2022-10-23 ENCOUNTER — Ambulatory Visit: Payer: Medicare Other | Admitting: Physical Therapy

## 2022-10-23 DIAGNOSIS — M25562 Pain in left knee: Secondary | ICD-10-CM | POA: Diagnosis not present

## 2022-10-23 DIAGNOSIS — R262 Difficulty in walking, not elsewhere classified: Secondary | ICD-10-CM | POA: Diagnosis not present

## 2022-10-23 DIAGNOSIS — M25561 Pain in right knee: Secondary | ICD-10-CM

## 2022-10-23 DIAGNOSIS — R600 Localized edema: Secondary | ICD-10-CM

## 2022-10-23 DIAGNOSIS — M6281 Muscle weakness (generalized): Secondary | ICD-10-CM

## 2022-10-23 DIAGNOSIS — M25571 Pain in right ankle and joints of right foot: Secondary | ICD-10-CM

## 2022-10-23 DIAGNOSIS — G8929 Other chronic pain: Secondary | ICD-10-CM

## 2022-10-23 NOTE — Therapy (Signed)
OUTPATIENT PHYSICAL THERAPY TREATMENT NOTE   Patient Name: Jessica Roberts MRN: [id number] DOB:1946/04/29, 77 y.o., female Today's Date: 10/23/2022  END OF SESSION:   PT End of Session - 10/23/22 1346     Visit Number 7    Number of Visits 20    Date for PT Re-Evaluation 11/23/22    Progress Note Due on Visit 10    PT Start Time 1306    PT Stop Time 1346    PT Time Calculation (min) 40 min    Activity Tolerance Patient tolerated treatment well    Behavior During Therapy [facility] for tasks assessed/performed               Past Medical History:  Diagnosis Date   Allergy    Anxiety    Anxiety and depression    Breast cancer ([place]) 04/07/15   right breast   Breast cancer of upper-outer quadrant of right female breast ([place]) 04/11/2015   Broken nose    Complication of anesthesia    "had a very hard time waking up"   Dengue fever    Depression    Fall    broken nose   GERD (gastroesophageal reflux disease)    Head injury 1995   concussion in [place], med evac to [place], ear damage with vertigo   Head injury, acute, with loss of consciousness ([place]) 2000   [place]   Head [place], closed, with concussion 1986   [place]   Headache    Hypertension    Malaria    PONV (postoperative nausea and vomiting)    Typhoid    Past Surgical History:  Procedure Laterality Date   BREAST LUMPECTOMY Right 04/27/2015   BREAST LUMPECTOMY WITH NEEDLE LOCALIZATION AND AXILLARY SENTINEL LYMPH NODE BX Right 04/27/2015   Procedure: RIGHT BREAST LUMPECTOMY WITH  TWO NEEDLE LOCALIZATION AND TWO RIGHT AXILLARY SENTINEL LYMPH NODE BX;  Surgeon: Fanny Skates, MD;  Location: [facility];  Service: General;  Laterality: Right;   COLONOSCOPY W/ BIOPSIES AND POLYPECTOMY     NERVE SURGERY     '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' during surgery for endometriosis   surgical procedure for endometriosis     TONSILLECTOMY     Patient Active Problem List   Diagnosis Date Noted   Early stage  nonexudative age-related macular degeneration of both eyes 11/27/2021   Choroidal nevus, right 11/27/2021   Nuclear sclerotic cataract of both eyes 11/27/2021   Iron deficiency 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 (chronic) 07/19/2020   Breast cancer of upper-outer quadrant of right female breast ([place]) 04/11/2015     THERAPY DIAG:  Chronic pain of right knee  Chronic pain of left knee  Localized edema  Difficulty in walking, not elsewhere classified  Pain in right ankle and joints of right foot  Muscle weakness (generalized)   PCP: Derinda Late, MD   REFERRING PROVIDER:  Mcarthur Rossetti, MD   REFERRING DIAG:  Diagnosis  M25.561,M25.562 (ICD-10-CM) - Pain in both knees, unspecified chronicity    EVAL THERAPY DIAG:  Chronic pain of right knee  Chronic pain of left knee  Localized edema  Difficulty in walking, not elsewhere classified  Muscle weakness (generalized)  Pain in right ankle and joints of right foot  Rationale for Evaluation and Treatment: Rehabilitation  ONSET DATE: years ago   SUBJECTIVE STATEMENT: Pt stating her Rt ankle is 2/10 but can get worse at times pending on her level of activity. Pt stating no pain in  her Rt knee at rest. Pt did state getting up from a chair still is painful.   PERTINENT HISTORY: Allergies, breast CA, broken nose, h/o fall, depression, anxiety, head injury, HTN, breast lumpectomy  PAIN:  NPRS scale/location: see above Pain description: achy, tightness Aggravating factors: up and down stairs, when transitioning from sit to stand Relieving factors: Tylenol, Voltaran gel  PRECAUTIONS: None  WEIGHT BEARING RESTRICTIONS: No  FALLS:  Has patient fallen in last 6 months? No  LIVING ENVIRONMENT: Lives with: lives with their family and lives alone Lives in: House/apartment Stairs: Yes: External: 4 steps; bilateral but cannot reach both Has following equipment at home: None  OCCUPATION: retired  PLOF:  Independent  PATIENT GOALS: Get up and down out of recliner with no pain or stiffness     Stop knee from "giving out" when going up steps   OBJECTIVE:   DIAGNOSTIC FINDINGS: Result Narrative from 09/05/22  2 views of the right knee show just slight medial joint space narrowing.   There is otherwise no acute findings.  PATIENT SURVEYS:  09/11/22: FOTO intake: 49   predicted:  62  MUSCLE LENGTH: 09/11/22: Hamstrings: Right 40 deg; Left 45 deg   POSTURE: rounded shoulders and forward head, decreased lumbar lordosis  PALPATION: No pin point tenderness note at eval  LOWER EXTREMITY ROM:   ROM Right 09/11/22 Left 09/11/22 Rt 10/15/22  Hip flexion 100 106   Hip extension     Hip abduction     Hip adduction     Hip internal rotation     Hip external rotation     Knee flexion 124 130   Knee extension 0 0   Ankle dorsiflexion 2 20 A: 6 P: 10  Ankle plantarflexion 18 32 A: 30 P: 35  Ankle inversion 34 40 A: 42 P: 42  Ankle eversion 30 34 A: 30 P: 45   (Blank rows = not tested)  LOWER EXTREMITY MMT:  MMT Right Eval 09/11/22 Left Eval 09/11/22 Rt 10/23/22  Hip flexion 4/5 5/5 4+  Hip extension     Hip abduction 4/5 4+/5 4+  Hip adduction 4/5 4+/5 4+  Hip internal rotation     Hip external rotation     Knee flexion 5/5 5/5 5  Knee extension 5/5 5/5 5  Ankle dorsiflexion 4/5 5/5 4+  Ankle plantarflexion 4/5 5/5 4+  Ankle inversion 4/5 5/5 4+  Ankle eversion 4/5 5/5 4+   (Blank rows = not tested)    FUNCTIONAL TESTS:   10/23/22: 5 time sit to stand: 12 seconds with UE support      5 time sit to stand: 10 seconds  with no UE support   09/11/22: 5 time sit to stand: 16 seconds no UE support  GAIT: Distance walked: 30 feet  Assistive device utilized: None Level of assistance: Complete Independence Comments: mild decreased heel strike in Rt LE   TODAY'S TREATMENT                                                                          DATE: 10/23/22 TherEx:   Recumbent bike:  L3 x 6 minutes for strength and endurance Toe and Heel raises x 20 c UE support Lateral  step ups x 10 each direction c UE support Seated Rt hamstring stretch x 2 holding 30 sec TKE on Rt Level 3 band  2 x 10 holding 3 sec Leg Press:  bilateral LE"s 37# x 10 50# 2 x 10, Rt LE only: 18# x 10 Neuromuscular Re-edu:  Rocker board: fitter first 2 pivot points: (df/pf) 1 minute, side to side x 1 minute Blaze pod (3 pod) tap placed in semicircle in front of pt's while performing SLS on level ground   10/17/22 TherEx Recumbent bike L3 x 6 min Leg Press bil 75# 2x15; single leg 25# 2x15 bil Rocker board: fitter first 2 pivot points: (df/pf) 1 minute; decreasing UE support as able Heel taps from 4" block with light UE support 2x10 bil Bridges x20 reps Single limb clamshell 2x10 bil; L4 band  10/15/22:  TherEx:  Recumbent bike:  L3 x 6 minutes for strength and endurance Toe and Heel raises x 20 c UE support Cone tapping placed in front of each LE x 20 c single UE support Lateral step ups x 10 each direction c UE support Mini squats in parallel bars c UE support x 10 Standing hamstring curls x 10 each LE c UE support BAPS L2 standing: x 1 minute each direction c UE support Rocker board: fitter first 2 pivot points: (df/pf) 1 minute Standing: hip extension x 10 each LE, Level 2 band Bridges x 10 holding 5 seconds Rt hamstring stretch x 2 holding 30 sec     PATIENT EDUCATION:  Education details: HEP, POC Person educated: Patient Education method: Consulting civil engineer, Media planner, Verbal cues, and Handouts Education comprehension: verbalized understanding, returned demonstration, and verbal cues required  HOME EXERCISE PROGRAM: Access Code: [email or web address] URL: [email or web address] Date: 10/01/2022 Prepared by: Kearney Hard  Exercises - Supine Bridge  - 2 x daily - 2 sets - 10 reps - 5 seconds hold - Hooklying Hamstring Stretch with Strap  - 2 x daily - 3 reps -  30 seconds hold - Seated Hamstring Stretch  - 2 x daily - 3 reps - 30 seconds hold - Seated Ankle Alphabet  - 2 x daily - 1 reps - Heel Raises with Counter Support  - 2 x daily - 2 sets - 10 reps - Ankle Dorsiflexion with Resistance  - 1-2 x daily - 7 x weekly - 2 sets - 10 reps - Ankle Inversion with Resistance  - 1-2 x daily - 7 x weekly - 2 sets - 10 reps - Ankle Eversion with Resistance  - 1-2 x daily - 7 x weekly - 2 sets - 10 reps - Ankle and Toe Plantarflexion with Resistance  - 1-2 x daily - 7 x weekly - 2 sets - 10 reps  ASSESSMENT:  CLINICAL IMPRESSION: Treatment focusing on right ankle ROM and strengthening. Pt tolerating all exercises well with no reports of increased pain.  Pt has improved her 5 time sit to stand using no UE support. Continue skilled PT interventions to progress toward LTG's set.   OBJECTIVE IMPAIRMENTS: decreased activity tolerance, difficulty walking, decreased ROM, decreased strength, and pain.   ACTIVITY LIMITATIONS: standing, squatting, stairs, and transfers  PARTICIPATION LIMITATIONS: cleaning and community activity  PERSONAL FACTORS: 3+ comorbidities: see above history are also affecting patient's functional outcome.   REHAB POTENTIAL: Good  CLINICAL DECISION MAKING: Stable/uncomplicated  EVALUATION COMPLEXITY: Low   GOALS: Goals reviewed with patient? Yes  SHORT TERM GOALS: (target date for Short term goals are 3 weeks 10/06/22)   1.  Patient will demonstrate independent use of home exercise program to maintain progress from in clinic treatments.  Goal status: MET 10/15/22  LONG TERM GOALS: (target dates for all long term goals are 10 weeks  11/23/22 )   1. Patient will demonstrate/report pain at worst less than or equal to 2/10 to facilitate minimal limitation in daily activity secondary to pain symptoms.  Goal status: On-going 10/23/22   2. Patient will demonstrate independent use of home exercise program to facilitate ability to  maintain/progress functional gains from skilled physical therapy services.  Goal status: On-going 10/23/22   3. Patient will demonstrate FOTO outcome > or = 62 % to indicate reduced disability due to condition.  Goal status: New   4.  Pt will be able to amb up and down 1 flight of stairs with single hand rail with step over step gait pattern with no pain reported.   Goal status: On-going 10/23/22   5.  Patient will be able to improve her 5 time sit to stand to </= 12 seconds with no UE support for improved functional mobility   Goal status: MET 10/23/22   6.  Patient will be able to report able 20 minutes with no pain in bil knees or Rt ankle.   Goal status: On-going 10/23/22    PLAN:  PT FREQUENCY: 1-2x/week  PT DURATION: 10 weeks  PLANNED INTERVENTIONS: Therapeutic exercises, Therapeutic activity, Neuro Muscular re-education, Balance training, Gait training, Patient/Family education, Joint mobilization, Stair training, DME instructions, Dry Needling, Electrical stimulation, Traction, Cryotherapy, vasopneumatic deviceMoist heat, Taping, Ultrasound, Ionotophoresis '4mg'$ /ml Dexamethasone, and Manual therapy.  All included unless contraindicated  PLAN FOR NEXT SESSION:  general LE strengthening, and functional mobility training, ankle stability   Kearney Hard, PT, MPT 10/23/22 1:48 PM   10/23/22 1:48 PM

## 2022-10-25 ENCOUNTER — Encounter: Payer: Medicare Other | Admitting: Physical Therapy

## 2022-10-29 ENCOUNTER — Encounter: Payer: Self-pay | Admitting: Physical Therapy

## 2022-10-29 ENCOUNTER — Ambulatory Visit: Payer: Medicare Other | Admitting: Physical Therapy

## 2022-10-29 DIAGNOSIS — R262 Difficulty in walking, not elsewhere classified: Secondary | ICD-10-CM

## 2022-10-29 DIAGNOSIS — M25561 Pain in right knee: Secondary | ICD-10-CM | POA: Diagnosis not present

## 2022-10-29 DIAGNOSIS — G8929 Other chronic pain: Secondary | ICD-10-CM

## 2022-10-29 DIAGNOSIS — M25562 Pain in left knee: Secondary | ICD-10-CM

## 2022-10-29 DIAGNOSIS — R600 Localized edema: Secondary | ICD-10-CM

## 2022-10-29 NOTE — Therapy (Signed)
OUTPATIENT PHYSICAL THERAPY TREATMENT NOTE   Patient Name: Jessica Roberts MRN: [id number] DOB:[birth year], 77 y.o., female Today's Date: 10/29/2022  END OF SESSION:   PT End of Session - 10/29/22 1155     Visit Number 8    Number of Visits 20    Date for PT Re-Evaluation 11/23/22    Progress Note Due on Visit 10    PT Start Time [id number]    PT Stop Time 1232    PT Time Calculation (min) 38 min    Activity Tolerance Patient tolerated treatment well    Behavior During Therapy [facility] for tasks assessed/performed                Past Medical History:  Diagnosis Date   Allergy    Anxiety    Anxiety and depression    Breast cancer ([place]) 04/07/15   right breast   Breast cancer of upper-outer quadrant of right female breast ([place]) 04/11/2015   Broken nose    Complication of anesthesia    "had a very hard time waking up"   Dengue fever    Depression    Fall    broken nose   GERD (gastroesophageal reflux disease)    Head injury 1995   concussion in [place], med evac to [place], ear damage with vertigo   Head injury, acute, with loss of consciousness ([place]) 2000   [place]   Head injury, closed, with concussion 1986   [place]   Headache    Hypertension    Malaria    PONV (postoperative nausea and vomiting)    Typhoid    Past Surgical History:  Procedure Laterality Date   BREAST LUMPECTOMY Right 04/27/2015   BREAST LUMPECTOMY WITH NEEDLE LOCALIZATION AND AXILLARY SENTINEL LYMPH NODE BX Right 04/27/2015   Procedure: RIGHT BREAST LUMPECTOMY WITH  TWO NEEDLE LOCALIZATION AND TWO RIGHT AXILLARY SENTINEL LYMPH NODE BX;  Surgeon: Fanny Skates, MD;  Location: [facility];  Service: General;  Laterality: Right;   COLONOSCOPY W/ BIOPSIES AND POLYPECTOMY     NERVE SURGERY     '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' during surgery for endometriosis   surgical procedure for endometriosis     TONSILLECTOMY     Patient Active Problem List   Diagnosis Date Noted   Early  stage nonexudative age-related macular degeneration of both eyes 11/27/2021   Choroidal nevus, right 11/27/2021   Nuclear sclerotic cataract of both eyes 11/27/2021   Iron deficiency 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 (chronic) 07/19/2020   Breast cancer of upper-outer quadrant of right female breast ([place]) 04/11/2015     THERAPY DIAG:  Chronic pain of right knee  Chronic pain of left knee  Localized edema  Difficulty in walking, not elsewhere classified   PCP: Derinda Late, MD   REFERRING PROVIDER:  Mcarthur Rossetti, MD   REFERRING DIAG:  Diagnosis  M25.561,M25.562 (ICD-10-CM) - Pain in both knees, unspecified chronicity    EVAL THERAPY DIAG:  Chronic pain of right knee  Chronic pain of left knee  Localized edema  Difficulty in walking, not elsewhere classified  Muscle weakness (generalized)  Pain in right ankle and joints of right foot  Rationale for Evaluation and Treatment: Rehabilitation  ONSET DATE: years ago   SUBJECTIVE STATEMENT: Pt reporting pain of 4/10 in her Rt knee both medial and lateral to patella. Pt stating she may have aggravated it in water aerobics.   PERTINENT HISTORY: Allergies, breast CA, broken nose, h/o fall, depression,  anxiety, head injury, HTN, breast lumpectomy  PAIN:  NPRS scale/location: 3-4/10 Pain description: achy, tightness, soreness Aggravating factors: up and down stairs, when transitioning from sit to stand Relieving factors: Tylenol, Voltaran gel  PRECAUTIONS: None  WEIGHT BEARING RESTRICTIONS: No  FALLS:  Has patient fallen in last 6 months? No  LIVING ENVIRONMENT: Lives with: lives with their family and lives alone Lives in: House/apartment Stairs: Yes: External: 4 steps; bilateral but cannot reach both Has following equipment at home: None  OCCUPATION: retired  PLOF: Independent  PATIENT GOALS: Get up and down out of recliner with no pain or stiffness     Stop knee from "giving out" when going  up steps   OBJECTIVE:   DIAGNOSTIC FINDINGS: Result Narrative from 09/05/22  2 views of the right knee show just slight medial joint space narrowing.   There is otherwise no acute findings.  PATIENT SURVEYS:  09/11/22: FOTO intake: 49   predicted:  62  MUSCLE LENGTH: 09/11/22: Hamstrings: Right 40 deg; Left 45 deg   POSTURE: rounded shoulders and forward head, decreased lumbar lordosis  PALPATION: No pin point tenderness note at eval  LOWER EXTREMITY ROM:   ROM Right 09/11/22 Left 09/11/22 Rt 10/15/22  Hip flexion 100 106   Hip extension     Hip abduction     Hip adduction     Hip internal rotation     Hip external rotation     Knee flexion 124 130   Knee extension 0 0   Ankle dorsiflexion 2 20 A: 6 P: 10  Ankle plantarflexion 18 32 A: 30 P: 35  Ankle inversion 34 40 A: 42 P: 42  Ankle eversion 30 34 A: 30 P: 45   (Blank rows = not tested)  LOWER EXTREMITY MMT:  MMT Right Eval 09/11/22 Left Eval 09/11/22 Rt 10/23/22  Hip flexion 4/5 5/5 4+  Hip extension     Hip abduction 4/5 4+/5 4+  Hip adduction 4/5 4+/5 4+  Hip internal rotation     Hip external rotation     Knee flexion 5/5 5/5 5  Knee extension 5/5 5/5 5  Ankle dorsiflexion 4/5 5/5 4+  Ankle plantarflexion 4/5 5/5 4+  Ankle inversion 4/5 5/5 4+  Ankle eversion 4/5 5/5 4+   (Blank rows = not tested)    FUNCTIONAL TESTS:   10/23/22: 5 time sit to stand: 12 seconds with UE support      5 time sit to stand: 10 seconds  with no UE support   09/11/22: 5 time sit to stand: 16 seconds no UE support  GAIT: Distance walked: 30 feet  Assistive device utilized: None Level of assistance: Complete Independence Comments: mild decreased heel strike in Rt LE   TODAY'S TREATMENT                                                                          DATE: 10/29/22 TherEx:  Nustep: Level 6 x 8 minutes Calf stretch x 3 holding 30 sec Sit to stand: x 10 no UE support Forward lunge on 4 inch step x 20 in  pain free range Seated LAQ x 20 c 3 # weight on Rt.  Lateral step ups x 15  each direction Manual:  K-tape: in "C" pattern with strip across patella for support    10/23/22 TherEx:  Recumbent bike:  L3 x 6 minutes for strength and endurance Toe and Heel raises x 20 c UE support Lateral step ups x 10 each direction c UE support Seated Rt hamstring stretch x 2 holding 30 sec TKE on Rt Level 3 band  2 x 10 holding 3 sec Leg Press:  bilateral LE"s 37# x 10 50# 2 x 10, Rt LE only: 18# x 10 Neuromuscular Re-edu:  Rocker board: fitter first 2 pivot points: (df/pf) 1 minute, side to side x 1 minute Blaze pod (3 pod) tap placed in semicircle in front of pt's while performing SLS on level ground   10/17/22 TherEx Recumbent bike L3 x 6 min Leg Press bil 75# 2x15; single leg 25# 2x15 bil Rocker board: fitter first 2 pivot points: (df/pf) 1 minute; decreasing UE support as able Heel taps from 4" block with light UE support 2x10 bil Bridges x20 reps Single limb clamshell 2x10 bil; L4 band  10/15/22:  TherEx:  Recumbent bike:  L3 x 6 minutes for strength and endurance Toe and Heel raises x 20 c UE support Cone tapping placed in front of each LE x 20 c single UE support Lateral step ups x 10 each direction c UE support Mini squats in parallel bars c UE support x 10 Standing hamstring curls x 10 each LE c UE support BAPS L2 standing: x 1 minute each direction c UE support Rocker board: fitter first 2 pivot points: (df/pf) 1 minute Standing: hip extension x 10 each LE, Level 2 band Bridges x 10 holding 5 seconds Rt hamstring stretch x 2 holding 30 sec     PATIENT EDUCATION:  Education details: HEP, POC Person educated: Patient Education method: Consulting civil engineer, Media planner, Verbal cues, and Handouts Education comprehension: verbalized understanding, returned demonstration, and verbal cues required  HOME EXERCISE PROGRAM: Access Code: [email or web address] URL:  [email or web address] Date: 10/01/2022 Prepared by: Kearney Hard  Exercises - Supine Bridge  - 2 x daily - 2 sets - 10 reps - 5 seconds hold - Hooklying Hamstring Stretch with Strap  - 2 x daily - 3 reps - 30 seconds hold - Seated Hamstring Stretch  - 2 x daily - 3 reps - 30 seconds hold - Seated Ankle Alphabet  - 2 x daily - 1 reps - Heel Raises with Counter Support  - 2 x daily - 2 sets - 10 reps - Ankle Dorsiflexion with Resistance  - 1-2 x daily - 7 x weekly - 2 sets - 10 reps - Ankle Inversion with Resistance  - 1-2 x daily - 7 x weekly - 2 sets - 10 reps - Ankle Eversion with Resistance  - 1-2 x daily - 7 x weekly - 2 sets - 10 reps - Ankle and Toe Plantarflexion with Resistance  - 1-2 x daily - 7 x weekly - 2 sets - 10 reps  ASSESSMENT:  CLINICAL IMPRESSION: Continuing to focus on LE strengthening, stretching and pain free functional mobility. We discussed pt's different aquatic exercises that may have been aggravating her Rt knee pain and alternatives were given for trial. A trial of K-tape was also placed on pt's Rt knee for support. Pt was scheduled on 11/15/21 for [name]. Continue skilled PT interventions to progress toward LTG's set.   OBJECTIVE IMPAIRMENTS: decreased activity tolerance, difficulty walking, decreased ROM, decreased strength, and pain.   ACTIVITY LIMITATIONS: standing,  squatting, stairs, and transfers  PARTICIPATION LIMITATIONS: cleaning and community activity  PERSONAL FACTORS: 3+ comorbidities: see above history are also affecting patient's functional outcome.   REHAB POTENTIAL: Good  CLINICAL DECISION MAKING: Stable/uncomplicated  EVALUATION COMPLEXITY: Low   GOALS: Goals reviewed with patient? Yes  SHORT TERM GOALS: (target date for Short term goals are 3 weeks 10/06/22)   1.  Patient will demonstrate independent use of home exercise program to maintain progress from in clinic treatments.  Goal status: MET  10/15/22  LONG TERM GOALS: (target dates for all long term goals are 10 weeks  11/23/22 )   1. Patient will demonstrate/report pain at worst less than or equal to 2/10 to facilitate minimal limitation in daily activity secondary to pain symptoms.  Goal status: On-going 10/29/22   2. Patient will demonstrate independent use of home exercise program to facilitate ability to maintain/progress functional gains from skilled physical therapy services.  Goal status: On-going 10/29/22   3. Patient will demonstrate FOTO outcome > or = 62 % to indicate reduced disability due to condition.  Goal status: New   4.  Pt will be able to amb up and down 1 flight of stairs with single hand rail with step over step gait pattern with no pain reported.   Goal status: On-going 10/29/22   5.  Patient will be able to improve her 5 time sit to stand to </= 12 seconds with no UE support for improved functional mobility   Goal status: MET 10/23/22   6.  Patient will be able to report able 20 minutes with no pain in bil knees or Rt ankle.   Goal status: On-going 10/29/22    PLAN:  PT FREQUENCY: 1-2x/week  PT DURATION: 10 weeks  PLANNED INTERVENTIONS: Therapeutic exercises, Therapeutic activity, Neuro Muscular re-education, Balance training, Gait training, Patient/Family education, Joint mobilization, Stair training, DME instructions, Dry Needling, Electrical stimulation, Traction, Cryotherapy, vasopneumatic deviceMoist heat, Taping, Ultrasound, Ionotophoresis 4mg /ml Dexamethasone, and Manual therapy.  All included unless contraindicated  PLAN FOR NEXT SESSION:  general LE strengthening, and functional mobility training, ankle stability   Kearney Hard, PT, MPT 10/29/22 1:00 PM   10/29/22 1:00 PM

## 2022-10-31 DIAGNOSIS — K2970 Gastritis, unspecified, without bleeding: Secondary | ICD-10-CM | POA: Diagnosis not present

## 2022-10-31 DIAGNOSIS — E119 Type 2 diabetes mellitus without complications: Secondary | ICD-10-CM | POA: Diagnosis not present

## 2022-10-31 DIAGNOSIS — D5 Iron deficiency 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 (chronic): Secondary | ICD-10-CM | POA: Diagnosis not present

## 2022-11-05 ENCOUNTER — Encounter: Payer: Self-pay | Admitting: Physical Therapy

## 2022-11-05 ENCOUNTER — Ambulatory Visit: Payer: Medicare Other | Admitting: Physical Therapy

## 2022-11-05 DIAGNOSIS — M25561 Pain in right knee: Secondary | ICD-10-CM | POA: Diagnosis not present

## 2022-11-05 DIAGNOSIS — R600 Localized edema: Secondary | ICD-10-CM | POA: Diagnosis not present

## 2022-11-05 DIAGNOSIS — R262 Difficulty in walking, not elsewhere classified: Secondary | ICD-10-CM | POA: Diagnosis not present

## 2022-11-05 DIAGNOSIS — M6281 Muscle weakness (generalized): Secondary | ICD-10-CM

## 2022-11-05 DIAGNOSIS — M25571 Pain in right ankle and joints of right foot: Secondary | ICD-10-CM

## 2022-11-05 DIAGNOSIS — M25562 Pain in left knee: Secondary | ICD-10-CM

## 2022-11-05 DIAGNOSIS — G8929 Other chronic pain: Secondary | ICD-10-CM

## 2022-11-05 NOTE — Therapy (Signed)
OUTPATIENT PHYSICAL THERAPY TREATMENT NOTE   Patient Name: Jessica Roberts MRN: [id number] DOB:04-Mar-1946, 77 y.o., female Today's Date: 11/05/2022  END OF SESSION:        Past Medical History:  Diagnosis Date   Allergy    Anxiety    Anxiety and depression    Breast cancer ([place]) 04/07/15   right breast   Breast cancer of upper-outer quadrant of right female breast ([place]) 04/11/2015   Broken nose    Complication of anesthesia    "had a very hard time waking up"   Dengue fever    Depression    Fall    broken nose   GERD (gastroesophageal reflux disease)    Head injury 1995   concussion in [place], med evac to [place], ear damage with vertigo   Head injury, acute, with loss of consciousness ([place]) 2000   [place] the [place]   Head injury, closed, with concussion 1986   [place]   Headache    Hypertension    Malaria    PONV (postoperative nausea and vomiting)    Typhoid    Past Surgical History:  Procedure Laterality Date   BREAST LUMPECTOMY Right 04/27/2015   BREAST LUMPECTOMY WITH NEEDLE LOCALIZATION AND AXILLARY SENTINEL LYMPH NODE BX Right 04/27/2015   Procedure: RIGHT BREAST LUMPECTOMY WITH  TWO NEEDLE LOCALIZATION AND TWO RIGHT AXILLARY SENTINEL LYMPH NODE BX;  Surgeon: Fanny Skates, MD;  Location: [facility] OR;  Service: General;  Laterality: Right;   COLONOSCOPY W/ BIOPSIES AND POLYPECTOMY     NERVE SURGERY     '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   PCP: Derinda Late, MD   REFERRING PROVIDER:  Mcarthur Rossetti, MD   REFERRING DIAG:  Diagnosis  [phone] (ICD-10-CM) - Pain in both knees, unspecified chronicity    EVAL THERAPY DIAG:  Chronic pain of right knee  Chronic pain of left knee  Localized edema  Difficulty in walking, not elsewhere classified  Muscle weakness (generalized)  Pain in right ankle and joints of right foot  Rationale for Evaluation and Treatment: Rehabilitation  ONSET DATE: years ago   SUBJECTIVE STATEMENT: Pt arriving a few minutes lat for therapy today. Pt reporting pain of 4/10 in her Rt knee both medial and lateral to patella. Pt stating she may have aggravated it in water aerobics.   PERTINENT HISTORY: Allergies, breast CA, broken nose, h/o fall, depression, anxiety, head injury, HTN, breast lumpectomy  PAIN:  NPRS scale/location: 3-4/10 Pain description: achy, tightness, soreness Aggravating factors: up and down stairs, when transitioning from sit to stand Relieving factors: Tylenol, Voltaran gel  PRECAUTIONS: None  WEIGHT BEARING RESTRICTIONS: No  FALLS:  Has patient fallen in last 6 months? No  LIVING ENVIRONMENT: Lives with: lives with their family and lives alone Lives in: House/apartment Stairs: Yes: External: 4 steps; bilateral but cannot reach both Has following equipment at home: None  OCCUPATION: retired  PLOF: Independent  PATIENT GOALS: Get up and down out of  recliner with no pain or stiffness     Stop knee from "giving out" when going up steps   OBJECTIVE:   DIAGNOSTIC FINDINGS: Result Narrative from 09/05/22  2 views of the right knee show just slight medial joint space narrowing.   There is otherwise no acute findings.  PATIENT SURVEYS:  09/11/22: FOTO intake: 49   predicted:  62  MUSCLE LENGTH: 09/11/22: Hamstrings: Right 40 deg; Left 45 deg   POSTURE: rounded shoulders and forward head, decreased lumbar  lordosis  PALPATION: No pin point tenderness note at eval  LOWER EXTREMITY ROM:   ROM Right 09/11/22 Left 09/11/22 Rt 10/15/22  Hip flexion 100 106   Hip extension     Hip abduction     Hip adduction     Hip internal rotation     Hip external rotation     Knee flexion 124 130   Knee extension 0 0   Ankle dorsiflexion 2 20 A: 6 P: 10  Ankle plantarflexion 18 32 A: 30 P: 35  Ankle inversion 34 40 A: 42 P: 42  Ankle eversion 30 34 A: 30 P: 45   (Blank rows = not tested)  LOWER EXTREMITY MMT:  MMT Right Eval 09/11/22 Left Eval 09/11/22 Rt 10/23/22  Hip flexion 4/5 5/5 4+  Hip extension     Hip abduction 4/5 4+/5 4+  Hip adduction 4/5 4+/5 4+  Hip internal rotation     Hip external rotation     Knee flexion 5/5 5/5 5  Knee extension 5/5 5/5 5  Ankle dorsiflexion 4/5 5/5 4+  Ankle plantarflexion 4/5 5/5 4+  Ankle inversion 4/5 5/5 4+  Ankle eversion 4/5 5/5 4+   (Blank rows = not tested)    FUNCTIONAL TESTS:   10/23/22: 5 time sit to stand: 12 seconds with UE support      5 time sit to stand: 10 seconds  with no UE support   09/11/22: 5 time sit to stand: 16 seconds no UE support  GAIT: Distance walked: 30 feet  Assistive device utilized: None Level of assistance: Complete Independence Comments: mild decreased heel strike in Rt LE   TODAY'S TREATMENT                                                                          DATE: 11/05/22 TherEx:  Recumbent bike:  Level 4 x 7 minutes Calf stretch x 3 holding 30 sec Sit to stand: x 10 no UE support Vectors with sliding disc 60 sec toward 3 cones performed on each LE Side stepping Red TB x 20 feet each direction Forward lunge on 4 inch step x 20 in pain free range Seated LAQ x 20 c 3 # weight on Rt.  Lateral step ups x 15 each direction Manual:  K-tape: in "C" pattern with strip across patella for support   10/29/22 TherEx:  Nustep: Level 6 x 8 minutes Calf stretch x 3 holding 30 sec Sit to stand: x  10 no UE support Forward lunge on 4 inch step x 20 in pain free range Seated LAQ x 20 c 3 # weight on Rt.  Lateral step ups x 15 each direction Manual:  K-tape: in "C"  pattern with strip across patella for support    10/23/22 TherEx:  Recumbent bike:  L3 x 6 minutes for strength and endurance Toe and Heel raises x 20 c UE support Lateral step ups x 10 each direction c UE support Seated Rt hamstring stretch x 2 holding 30 sec TKE on Rt Level 3 band  2 x 10 holding 3 sec Leg Press:  bilateral LE"s 37# x 10 50# 2 x 10, Rt LE only: 18# x 10 Neuromuscular Re-edu:  Rocker board: fitter first 2 pivot points: (df/pf) 1 minute, side to side x 1 minute Blaze pod (3 pod) tap placed in semicircle in front of pt's while performing SLS on level ground   10/17/22 TherEx Recumbent bike L3 x 6 min Leg Press bil 75# 2x15; single leg 25# 2x15 bil Rocker board: fitter first 2 pivot points: (df/pf) 1 minute; decreasing UE support as able Heel taps from 4" block with light UE support 2x10 bil Bridges x20 reps Single limb clamshell 2x10 bil; L4 band       PATIENT EDUCATION:  Education details: HEP, POC Person educated: Patient Education method: Consulting civil engineer, Demonstration, Verbal cues, and Handouts Education comprehension: verbalized understanding, returned demonstration, and verbal cues required  HOME EXERCISE PROGRAM: Access Code: [email or web address] URL: [email or web address] Date: 11/05/2022 Prepared by: Kearney Hard  Exercises - Supine Bridge  - 2 x daily - 2 sets - 10 reps - 5 seconds hold - Hooklying Hamstring Stretch with Strap  - 2 x daily - 3 reps - 30 seconds hold - Seated Hamstring Stretch  - 2 x daily - 3 reps - 30 seconds hold - Seated Ankle Alphabet  - 2 x daily - 1 reps - Heel Raises with Counter Support  - 2 x daily - 2 sets - 10 reps - Ankle Dorsiflexion with Resistance  - 1-2 x daily - 7 x weekly - 2 sets - 10 reps - Ankle Inversion with Resistance  - 1-2 x daily -  7 x weekly - 2 sets - 10 reps - Ankle Eversion with Resistance  - 1-2 x daily - 7 x weekly - 2 sets - 10 reps - Ankle and Toe Plantarflexion with Resistance  - 1-2 x daily - 7 x weekly - 2 sets - 10 reps - Side Stepping with Resistance at Thighs  - 1 x daily - 7 x weekly - 1 reps  ASSESSMENT:  CLINICAL IMPRESSION: Pt stating the K-tape worked for a few days. Taping performed again this visit and sample given along with education on applying for support for home use. Pt tolerating all exercises well for LE strengthening and balance. Pt is scheduled for aquatic therapy this Friday at 8:45. Continue skilled PT interventions to progress toward LTG's set.   OBJECTIVE IMPAIRMENTS: decreased activity tolerance, difficulty walking, decreased ROM, decreased strength, and pain.   ACTIVITY LIMITATIONS: standing, squatting, stairs, and transfers  PARTICIPATION LIMITATIONS: cleaning and community activity  PERSONAL FACTORS: 3+ comorbidities: see above history are also affecting patient's functional outcome.   REHAB POTENTIAL: Good  CLINICAL DECISION MAKING: Stable/uncomplicated  EVALUATION COMPLEXITY: Low   GOALS: Goals reviewed with patient? Yes  SHORT TERM GOALS: (target date for Short term goals are 3 weeks 10/06/22)   1.  Patient will demonstrate independent use of home exercise program to maintain progress from in clinic treatments.  Goal status: MET 10/15/22  LONG TERM GOALS: (target dates for all long term goals are 10 weeks  11/23/22 )   1. Patient will demonstrate/report  pain at worst less than or equal to 2/10 to facilitate minimal limitation in daily activity secondary to pain symptoms.  Goal status: On-going 10/29/22   2. Patient will demonstrate independent use of home exercise program to facilitate ability to maintain/progress functional gains from skilled physical therapy services.  Goal status: On-going 10/29/22   3. Patient will demonstrate FOTO outcome > or = 62 % to  indicate reduced disability due to condition.  Goal status: New   4.  Pt will be able to amb up and down 1 flight of stairs with single hand rail with step over step gait pattern with no pain reported.   Goal status: On-going 10/29/22   5.  Patient will be able to improve her 5 time sit to stand to </= 12 seconds with no UE support for improved functional mobility   Goal status: MET 10/23/22   6.  Patient will be able to report able 20 minutes with no pain in bil knees or Rt ankle.   Goal status: On-going 10/29/22    PLAN:  PT FREQUENCY: 1-2x/week  PT DURATION: 10 weeks  PLANNED INTERVENTIONS: Therapeutic exercises, Therapeutic activity, Neuro Muscular re-education, Balance training, Gait training, Patient/Family education, Joint mobilization, Stair training, DME instructions, Dry Needling, Electrical stimulation, Traction, Cryotherapy, vasopneumatic deviceMoist heat, Taping, Ultrasound, Ionotophoresis 4mg /ml Dexamethasone, and Manual therapy.  All included unless contraindicated  PLAN FOR NEXT SESSION:   Response to aquatic therapy, taping as needed general LE strengthening, and functional mobility training, ankle stability   Kearney Hard, PT, MPT 11/05/22 1:50 PM   11/05/22 1:50 PM

## 2022-11-14 ENCOUNTER — Encounter: Payer: Self-pay | Admitting: Physical Therapy

## 2022-11-14 ENCOUNTER — Ambulatory Visit: Payer: Medicare Other | Admitting: Physical Therapy

## 2022-11-14 DIAGNOSIS — R600 Localized edema: Secondary | ICD-10-CM | POA: Diagnosis not present

## 2022-11-14 DIAGNOSIS — R262 Difficulty in walking, not elsewhere classified: Secondary | ICD-10-CM

## 2022-11-14 DIAGNOSIS — M25561 Pain in right knee: Secondary | ICD-10-CM

## 2022-11-14 DIAGNOSIS — M6281 Muscle weakness (generalized): Secondary | ICD-10-CM

## 2022-11-14 DIAGNOSIS — M25562 Pain in left knee: Secondary | ICD-10-CM

## 2022-11-14 DIAGNOSIS — M25571 Pain in right ankle and joints of right foot: Secondary | ICD-10-CM

## 2022-11-14 DIAGNOSIS — G8929 Other chronic pain: Secondary | ICD-10-CM

## 2022-11-14 NOTE — Therapy (Signed)
OUTPATIENT PHYSICAL THERAPY TREATMENT NOTE Progress NOTE   Patient Name: Jessica Roberts MRN: [id number] DOB:1946-04-21, 76 y.o., female Today's Date: 11/14/2022  Progress Note Reporting Period 09/11/22 to 11/14/2022   See note below for Objective Data and Assessment of Progress/Goals.     END OF SESSION:   PT End of Session - 11/14/22 1103     Visit Number 10    Number of Visits 20    Date for PT Re-Evaluation 11/23/22    Progress Note Due on Visit 20    PT Start Time 0850    PT Stop Time 0930    PT Time Calculation (min) 40 min    Activity Tolerance Patient tolerated treatment well    Behavior During Therapy [facility] for tasks assessed/performed                 Past Medical History:  Diagnosis Date   Allergy    Anxiety    Anxiety and depression    Breast cancer 04/07/15   right breast   Breast cancer of upper-outer quadrant of right female breast 04/11/2015   Broken nose    Complication of anesthesia    "had a very hard time waking up"   Dengue fever    Depression    Fall    broken nose   GERD (gastroesophageal reflux disease)    Head injury 1995   concussion in [place], med evac to [place], ear damage with vertigo   Head injury, acute, with loss of consciousness 2000   [place]   Head injury, closed, with concussion 1986   [place]   Headache    Hypertension    Malaria    PONV (postoperative nausea and vomiting)    Typhoid    Past Surgical History:  Procedure Laterality Date   BREAST LUMPECTOMY Right 04/27/2015   BREAST LUMPECTOMY WITH NEEDLE LOCALIZATION AND AXILLARY SENTINEL LYMPH NODE BX Right 04/27/2015   Procedure: RIGHT BREAST LUMPECTOMY WITH  TWO NEEDLE LOCALIZATION AND TWO RIGHT AXILLARY SENTINEL LYMPH NODE BX;  Surgeon: Fanny Skates, MD;  Location: [facility] OR;  Service: General;  Laterality: Right;   COLONOSCOPY W/ BIOPSIES AND POLYPECTOMY     NERVE SURGERY     '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' during surgery for  endometriosis   surgical procedure for endometriosis     TONSILLECTOMY     Patient Active Problem List   Diagnosis Date Noted   Early stage nonexudative age-related macular degeneration of both eyes 11/27/2021   Choroidal nevus, right 11/27/2021   Nuclear sclerotic cataract of both eyes 11/27/2021   Iron deficiency 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 (chronic) 07/19/2020   Breast cancer of upper-outer quadrant of right female breast 04/11/2015     THERAPY DIAG:  Chronic pain of right knee  Chronic pain of left knee  Difficulty in walking, not elsewhere classified  Localized edema  Pain in right ankle and joints of right foot  Muscle weakness (generalized)   PCP: Derinda Late, MD   REFERRING PROVIDER:  Mcarthur Rossetti, MD   REFERRING DIAG:  Diagnosis  M25.561,M25.562 (ICD-10-CM) - Pain in both knees, unspecified chronicity    EVAL THERAPY DIAG:  Chronic pain of right knee  Chronic pain of left knee  Localized edema  Difficulty in walking, not elsewhere classified  Muscle weakness (generalized)  Pain in right ankle and joints of right foot  Rationale for Evaluation and Treatment: Rehabilitation  ONSET DATE: years ago   SUBJECTIVE STATEMENT: Pt  arriving a few minutes lat for therapy today. Pt reporting pain of 4/10 in her Rt knee both medial and lateral to patella. Pt stating she may have aggravated it in water aerobics.   PERTINENT HISTORY: Allergies, breast CA, broken nose, h/o fall, depression, anxiety, head injury, HTN, breast lumpectomy  PAIN:  NPRS scale/location: 3-4/10 Pain description: achy, tightness, soreness Aggravating factors: up and down stairs, when transitioning from sit to stand Relieving factors: Tylenol, Voltaran gel  PRECAUTIONS: None  WEIGHT BEARING RESTRICTIONS: No  FALLS:  Has patient fallen in last 6 months? No  LIVING ENVIRONMENT: Lives with: lives with their family and lives alone Lives in:  House/apartment Stairs: Yes: External: 4 steps; bilateral but cannot reach both Has following equipment at home: None  OCCUPATION: retired  PLOF: Independent  PATIENT GOALS: Get up and down out of recliner with no pain or stiffness     Stop knee from "giving out" when going up steps   OBJECTIVE:   DIAGNOSTIC FINDINGS: Result Narrative from 09/05/22  2 views of the right knee show just slight medial joint space narrowing.   There is otherwise no acute findings.  PATIENT SURVEYS:  09/11/22: FOTO intake: 49   predicted:  62  MUSCLE LENGTH: 09/11/22: Hamstrings: Right 40 deg; Left 45 deg   POSTURE: rounded shoulders and forward head, decreased lumbar lordosis  PALPATION: No pin point tenderness note at eval  LOWER EXTREMITY ROM:   ROM Right 09/11/22 Left 09/11/22 Rt 10/15/22 Rt 11/14/22 Left 11/14/22  Hip flexion 100 106     Hip extension       Hip abduction       Hip adduction       Hip internal rotation       Hip external rotation       Knee flexion 124 130  A: 142 A: 138  Knee extension 0 0  A: 0 A: 0  Ankle dorsiflexion 2 20 A: 6 P: 10 A:10 P: 12   Ankle plantarflexion 18 32 A: 30 P: 35 A: 32 P: 36   Ankle inversion 34 40 A: 42 P: 42 A: 40 P: 42   Ankle eversion 30 34 A: 30 P: 45 A: 32 P: 44    (Blank rows = not tested)  LOWER EXTREMITY MMT:  MMT Right Eval 09/11/22 Left Eval 09/11/22 Rt 10/23/22  Hip flexion 4/5 5/5 4+  Hip extension     Hip abduction 4/5 4+/5 4+  Hip adduction 4/5 4+/5 4+  Hip internal rotation     Hip external rotation     Knee flexion 5/5 5/5 5  Knee extension 5/5 5/5 5  Ankle dorsiflexion 4/5 5/5 4+  Ankle plantarflexion 4/5 5/5 4+  Ankle inversion 4/5 5/5 4+  Ankle eversion 4/5 5/5 4+   (Blank rows = not tested)    FUNCTIONAL TESTS:   10/23/22: 5 time sit to stand: 12 seconds with UE support      5 time sit to stand: 10 seconds  with no UE support   09/11/22: 5 time sit to stand: 16 seconds no UE  support  GAIT: Distance walked: 30 feet  Assistive device utilized: None Level of assistance: Complete Independence Comments: mild decreased heel strike in Rt LE   TODAY'S TREATMENT  DATE: 11/14/22:  TherEx:  Calf stretch x 3 holding 30 sec Sit to stand: x 10 no UE support (pt reporting increased pain in her left knee) Hip abduction: x 15 Pt's HEP was updated and all exercises reviewed Manual:  K-tape: in "C" pattern with strip across patella for support bilateral LE's    11/05/22 TherEx:  Recumbent bike:  Level 4 x 7 minutes Calf stretch x 3 holding 30 sec Sit to stand: x 10 no UE support Vectors with sliding disc 60 sec toward 3 cones performed on each LE Side stepping Red TB x 20 feet each direction Forward lunge on 4 inch step x 20 in pain free range Seated LAQ x 20 c 3 # weight on Rt.  Lateral step ups x 15 each direction Manual:  K-tape: in "C" pattern with strip across patella for support   10/29/22 TherEx:  Nustep: Level 6 x 8 minutes Calf stretch x 3 holding 30 sec Sit to stand: x 10 no UE support Forward lunge on 4 inch step x 20 in pain free range Seated LAQ x 20 c 3 # weight on Rt.  Lateral step ups x 15 each direction Manual:  K-tape: in "C" pattern with strip across patella for support    10/23/22 TherEx:  Recumbent bike:  L3 x 6 minutes for strength and endurance Toe and Heel raises x 20 c UE support Lateral step ups x 10 each direction c UE support Seated Rt hamstring stretch x 2 holding 30 sec TKE on Rt Level 3 band  2 x 10 holding 3 sec Leg Press:  bilateral LE"s 37# x 10 50# 2 x 10, Rt LE only: 18# x 10 Neuromuscular Re-edu:  Rocker board: fitter first 2 pivot points: (df/pf) 1 minute, side to side x 1 minute Blaze pod (3 pod) tap placed in semicircle in front of pt's while performing SLS on level ground          PATIENT EDUCATION:  Education details: HEP,  POC Person educated: Patient Education method: Consulting civil engineer, Demonstration, Verbal cues, and Handouts Education comprehension: verbalized understanding, returned demonstration, and verbal cues required  HOME EXERCISE PROGRAM: Access Code: [email or web address] URL: [email or web address] Date: 11/14/2022 Prepared by: Kearney Hard  Exercises - Supine Bridge  - 2 x daily - 2 sets - 10 reps - 5 seconds hold - Hooklying Hamstring Stretch with Strap  - 2 x daily - 3 reps - 30 seconds hold - Seated Hamstring Stretch  - 2 x daily - 3 reps - 30 seconds hold - Seated Ankle Alphabet  - 2 x daily - 1 reps - Heel Raises with Counter Support  - 2 x daily - 2 sets - 10 reps - Ankle Dorsiflexion with Resistance  - 1-2 x daily - 7 x weekly - 2 sets - 10 reps - Ankle Inversion with Resistance  - 1-2 x daily - 7 x weekly - 2 sets - 10 reps - Ankle Eversion with Resistance  - 1-2 x daily - 7 x weekly - 2 sets - 10 reps - Ankle and Toe Plantarflexion with Resistance  - 1-2 x daily - 7 x weekly - 2 sets - 10 reps - Side Stepping with Resistance at Thighs  - 1 x daily - 7 x weekly - 1 reps  ASSESSMENT:  CLINICAL IMPRESSION: Pt arriving today reporting bilateral knee pain. Pt wishing to follow up with Dr. Ninfa Linden. Pt feels the taping has proved support along with overall strength and ROM gains since  beginning theapy but feels she has plateaued.  Pt Continue skilled PT interventions to progress toward LTG's set.   OBJECTIVE IMPAIRMENTS: decreased activity tolerance, difficulty walking, decreased ROM, decreased strength, and pain.   ACTIVITY LIMITATIONS: standing, squatting, stairs, and transfers  PARTICIPATION LIMITATIONS: cleaning and community activity  PERSONAL FACTORS: 3+ comorbidities: see above history are also affecting patient's functional outcome.   REHAB POTENTIAL: Good  CLINICAL DECISION MAKING: Stable/uncomplicated  EVALUATION COMPLEXITY: Low   GOALS: Goals reviewed with patient?  Yes  SHORT TERM GOALS: (target date for Short term goals are 3 weeks 10/06/22)   1.  Patient will demonstrate independent use of home exercise program to maintain progress from in clinic treatments.  Goal status: MET 10/15/22  LONG TERM GOALS: (target dates for all long term goals are 10 weeks  11/23/22 )   1. Patient will demonstrate/report pain at worst less than or equal to 2/10 to facilitate minimal limitation in daily activity secondary to pain symptoms.  Goal status: On-going 11/14/22   2. Patient will demonstrate independent use of home exercise program to facilitate ability to maintain/progress functional gains from skilled physical therapy services.  Goal status: On-going 11/14/22   3. Patient will demonstrate FOTO outcome > or = 62 % to indicate reduced disability due to condition.  Goal status: New   4.  Pt will be able to amb up and down 1 flight of stairs with single hand rail with step over step gait pattern with no pain reported.   Goal status: On-going 11/14/22   5.  Patient will be able to improve her 5 time sit to stand to </= 12 seconds with no UE support for improved functional mobility   Goal status: MET 10/23/22   6.  Patient will be able to report able 20 minutes with no pain in bil knees or Rt ankle.   Goal status: On-going 11/14/22    PLAN:  PT FREQUENCY: 1-2x/week  PT DURATION: 10 weeks  PLANNED INTERVENTIONS: Therapeutic exercises, Therapeutic activity, Neuro Muscular re-education, Balance training, Gait training, Patient/Family education, Joint mobilization, Stair training, DME instructions, Dry Needling, Electrical stimulation, Traction, Cryotherapy, vasopneumatic deviceMoist heat, Taping, Ultrasound, Ionotophoresis 4mg /ml Dexamethasone, and Manual therapy.  All included unless contraindicated  PLAN FOR NEXT SESSION:   Response to aquatic therapy, taping as needed general LE strengthening, and functional mobility training, ankle stability   Kearney Hard, PT, MPT 11/14/22 11:04 AM   11/14/22 11:04 AM

## 2022-11-16 ENCOUNTER — Ambulatory Visit (INDEPENDENT_AMBULATORY_CARE_PROVIDER_SITE_OTHER): Payer: Medicare Other | Admitting: Physical Therapy

## 2022-11-16 ENCOUNTER — Encounter: Payer: Self-pay | Admitting: Physical Therapy

## 2022-11-16 DIAGNOSIS — M25561 Pain in right knee: Secondary | ICD-10-CM

## 2022-11-16 DIAGNOSIS — R600 Localized edema: Secondary | ICD-10-CM

## 2022-11-16 DIAGNOSIS — M25571 Pain in right ankle and joints of right foot: Secondary | ICD-10-CM

## 2022-11-16 DIAGNOSIS — R262 Difficulty in walking, not elsewhere classified: Secondary | ICD-10-CM

## 2022-11-16 DIAGNOSIS — M25562 Pain in left knee: Secondary | ICD-10-CM

## 2022-11-16 DIAGNOSIS — M6281 Muscle weakness (generalized): Secondary | ICD-10-CM

## 2022-11-16 DIAGNOSIS — G8929 Other chronic pain: Secondary | ICD-10-CM

## 2022-11-16 NOTE — Therapy (Signed)
OUTPATIENT PHYSICAL THERAPY TREATMENT   Patient Name: Jessica Roberts MRN: [id number] DOB:Dec 19, 1945, 77 y.o., female Today's Date: 11/16/2022     END OF SESSION:   PT End of Session - 11/16/22 0844     Visit Number 11    Number of Visits 20    Date for PT Re-Evaluation 11/23/22    Progress Note Due on Visit 20    PT Start Time 0835    PT Stop Time 0915    PT Time Calculation (min) 40 min    Activity Tolerance Patient tolerated treatment well    Behavior During Therapy [facility] for tasks assessed/performed                 Past Medical History:  Diagnosis Date   Allergy    Anxiety    Anxiety and depression    Breast cancer 04/07/15   right breast   Breast cancer of upper-outer quadrant of right female breast 04/11/2015   Broken nose    Complication of anesthesia    "had a very hard time waking up"   Dengue fever    Depression    Fall    broken nose   GERD (gastroesophageal reflux disease)    Head injury 1995   concussion in [place], med evac to [place], ear damage with vertigo   Head injury, acute, with loss of consciousness 2000   [place] of the country   Head injury, closed, with concussion 1986   [place]   Headache    Hypertension    Malaria    PONV (postoperative nausea and vomiting)    Typhoid    Past Surgical History:  Procedure Laterality Date   BREAST LUMPECTOMY Right 04/27/2015   BREAST LUMPECTOMY WITH NEEDLE LOCALIZATION AND AXILLARY SENTINEL LYMPH NODE BX Right 04/27/2015   Procedure: RIGHT BREAST LUMPECTOMY WITH  TWO NEEDLE LOCALIZATION AND TWO RIGHT AXILLARY SENTINEL LYMPH NODE BX;  Surgeon: Claud Kelp, MD;  Location: [facility] OR;  Service: General;  Laterality: Right;   COLONOSCOPY W/ BIOPSIES AND POLYPECTOMY     NERVE SURGERY     '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' during surgery for endometriosis   surgical procedure for endometriosis     TONSILLECTOMY     Patient Active Problem List   Diagnosis Date Noted   Early stage nonexudative  age-related macular degeneration of both eyes 11/27/2021   Choroidal nevus, right 11/27/2021   Nuclear sclerotic cataract of both eyes 11/27/2021   Iron deficiency 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 (chronic) 07/19/2020   Breast cancer of upper-outer quadrant of right female breast 04/11/2015     THERAPY DIAG:  Chronic pain of right knee  Chronic pain of left knee  Difficulty in walking, not elsewhere classified  Localized edema  Pain in right ankle and joints of right foot  Muscle weakness (generalized)   PCP: [name], MD   REFERRING PROVIDER:  Kathryne Hitch, MD   REFERRING DIAG:  Diagnosis  M25.561,M25.562 (ICD-10-CM) - Pain in both knees, unspecified chronicity    EVAL THERAPY DIAG:  Chronic pain of right knee  Chronic pain of left knee  Localized edema  Difficulty in walking, not elsewhere classified  Muscle weakness (generalized)  Pain in right ankle and joints of right foot  Rationale for Evaluation and Treatment: Rehabilitation  ONSET DATE: years ago   SUBJECTIVE STATEMENT: She relays her knee pops sometimes, she says her Rt leg is weaker  PERTINENT HISTORY: Allergies, breast CA, broken nose, h/o fall, depression,  anxiety, head injury, HTN, breast lumpectomy  PAIN:  NPRS scale/location: 3-4/10 Pain description: achy, tightness, soreness Aggravating factors: up and down stairs, when transitioning from sit to stand Relieving factors: Tylenol, Voltaran gel  PRECAUTIONS: None  WEIGHT BEARING RESTRICTIONS: No  FALLS:  Has patient fallen in last 6 months? No  LIVING ENVIRONMENT: Lives with: lives with their family and lives alone Lives in: House/apartment Stairs: Yes: External: 4 steps; bilateral but cannot reach both Has following equipment at home: None  OCCUPATION: retired  PLOF: Independent  PATIENT GOALS: Get up and down out of recliner with no pain or stiffness     Stop knee from "giving out" when going up  steps   OBJECTIVE:   DIAGNOSTIC FINDINGS: Result Narrative from 09/05/22  2 views of the right knee show just slight medial joint space narrowing.   There is otherwise no acute findings.  PATIENT SURVEYS:  09/11/22: FOTO intake: 49   predicted:  62  MUSCLE LENGTH: 09/11/22: Hamstrings: Right 40 deg; Left 45 deg   POSTURE: rounded shoulders and forward head, decreased lumbar lordosis  PALPATION: No pin point tenderness note at eval  LOWER EXTREMITY ROM:   ROM Right 09/11/22 Left 09/11/22 Rt 10/15/22 Rt 11/14/22 Left 11/14/22  Hip flexion 100 106     Hip extension       Hip abduction       Hip adduction       Hip internal rotation       Hip external rotation       Knee flexion 124 130  A: 142 A: 138  Knee extension 0 0  A: 0 A: 0  Ankle dorsiflexion 2 20 A: 6 P: 10 A:10 P: 12   Ankle plantarflexion 18 32 A: 30 P: 35 A: 32 P: 36   Ankle inversion 34 40 A: 42 P: 42 A: 40 P: 42   Ankle eversion 30 34 A: 30 P: 45 A: 32 P: 44    (Blank rows = not tested)  LOWER EXTREMITY MMT:  MMT Right Eval 09/11/22 Left Eval 09/11/22 Rt 10/23/22  Hip flexion 4/5 5/5 4+  Hip extension     Hip abduction 4/5 4+/5 4+  Hip adduction 4/5 4+/5 4+  Hip internal rotation     Hip external rotation     Knee flexion 5/5 5/5 5  Knee extension 5/5 5/5 5  Ankle dorsiflexion 4/5 5/5 4+  Ankle plantarflexion 4/5 5/5 4+  Ankle inversion 4/5 5/5 4+  Ankle eversion 4/5 5/5 4+   (Blank rows = not tested)    FUNCTIONAL TESTS:   10/23/22: 5 time sit to stand: 12 seconds with UE support      5 time sit to stand: 10 seconds  with no UE support   09/11/22: 5 time sit to stand: 16 seconds no UE support  GAIT: Distance walked: 30 feet  Assistive device utilized: None Level of assistance: Complete Independence Comments: mild decreased heel strike in Rt LE   TODAY'S TREATMENT                                                                          DATE: 11/16/22 Pt seen for aquatic therapy  today.  Treatment  took place in water 3.5-4.75 ft in depth at the [facility] pool. Temp of water was 91.  Pt entered/exited the pool via stairs with rail.  Pt requires the buoyancy and hydrostatic pressure of water for support, and to offload joints by unweighting joint load by at least 50 % in navel deep water and by at least 75-80% in chest to neck deep water.  Viscosity of the water is needed for resistance of strengthening. Water current perturbations provides challenge to standing balance requiring increased core activation. Water exercises: Lateral walking, forward walking, retro walking 3 round trips in pool horizontal (30 feet) Leg swings add/abd X 20 bilat with UE support cues for slow and controlled movement Leg swings flexion/extension X 20 bilat with UE support cues for slow and controlled movements Standing marches with UE support X 20 bilat Standing hamstring curls with UE support X 20 bilat 1/4 squat isometric hold with row/chest press holding kickboard X 20 reps 1/4 squat with shoulder extension/flexion holding kickboard  X20 reps Walking mini lunges with UE support on floating dumbells 3 round trips Lateral walking with mini squat with bilateral shoulder abd/add with floating dumbells 3 round trips 1/4 lunge with shoulder flexion/extension using floating dumbells X 15 bilat Hip extensions X 10 with yellow noodle under knee and back against pool wall, X 10 bilat Sit to stands from pool bench with step underneath 2X10 Bicycle kicks seated on bench  X30 Hip abd/add kicks seated on bench X 30 Bilat hip/knee flexion/extension aka reverse crunch X 15 Standing gastroc stretch at edge of step 30 sec X 2 bilat   11/14/22:  TherEx:  Calf stretch x 3 holding 30 sec Sit to stand: x 10 no UE support (pt reporting increased pain in her left knee) Hip abduction: x 15 Pt's HEP was updated and all exercises reviewed Manual:  K-tape: in "C" pattern with strip across patella for  support bilateral LE's    11/05/22 TherEx:  Recumbent bike:  Level 4 x 7 minutes Calf stretch x 3 holding 30 sec Sit to stand: x 10 no UE support Vectors with sliding disc 60 sec toward 3 cones performed on each LE Side stepping Red TB x 20 feet each direction Forward lunge on 4 inch step x 20 in pain free range Seated LAQ x 20 c 3 # weight on Rt.  Lateral step ups x 15 each direction Manual:  K-tape: in "C" pattern with strip across patella for support   10/29/22 TherEx:  Nustep: Level 6 x 8 minutes Calf stretch x 3 holding 30 sec Sit to stand: x 10 no UE support Forward lunge on 4 inch step x 20 in pain free range Seated LAQ x 20 c 3 # weight on Rt.  Lateral step ups x 15 each direction Manual:  K-tape: in "C" pattern with strip across patella for support    10/23/22 TherEx:  Recumbent bike:  L3 x 6 minutes for strength and endurance Toe and Heel raises x 20 c UE support Lateral step ups x 10 each direction c UE support Seated Rt hamstring stretch x 2 holding 30 sec TKE on Rt Level 3 band  2 x 10 holding 3 sec Leg Press:  bilateral LE"s 37# x 10 50# 2 x 10, Rt LE only: 18# x 10 Neuromuscular Re-edu:  Rocker board: fitter first 2 pivot points: (df/pf) 1 minute, side to side x 1 minute Blaze pod (3 pod) tap placed in semicircle in front of pt's while  performing SLS on level ground          PATIENT EDUCATION:  Education details: HEP, POC Person educated: Patient Education method: Explanation, Demonstration, Verbal cues, and Handouts Education comprehension: verbalized understanding, returned demonstration, and verbal cues required  HOME EXERCISE PROGRAM: Access Code: [email or web address] URL: [email or web address] Date: 11/14/2022 Prepared by: Narda Amber  Exercises - Supine Bridge  - 2 x daily - 2 sets - 10 reps - 5 seconds hold - Hooklying Hamstring Stretch with Strap  - 2 x daily - 3 reps - 30 seconds hold - Seated Hamstring Stretch  - 2 x daily -  3 reps - 30 seconds hold - Seated Ankle Alphabet  - 2 x daily - 1 reps - Heel Raises with Counter Support  - 2 x daily - 2 sets - 10 reps - Ankle Dorsiflexion with Resistance  - 1-2 x daily - 7 x weekly - 2 sets - 10 reps - Ankle Inversion with Resistance  - 1-2 x daily - 7 x weekly - 2 sets - 10 reps - Ankle Eversion with Resistance  - 1-2 x daily - 7 x weekly - 2 sets - 10 reps - Ankle and Toe Plantarflexion with Resistance  - 1-2 x daily - 7 x weekly - 2 sets - 10 reps - Side Stepping with Resistance at Thighs  - 1 x daily - 7 x weekly - 1 reps  ASSESSMENT:  CLINICAL IMPRESSION: She had her first aquatic PT session. The water allowed [place] to work on strengthening and mobility with less demand on her knees for improved activity tolerance.  Pt Continue skilled PT interventions to progress toward LTG's set.   OBJECTIVE IMPAIRMENTS: decreased activity tolerance, difficulty walking, decreased ROM, decreased strength, and pain.   ACTIVITY LIMITATIONS: standing, squatting, stairs, and transfers  PARTICIPATION LIMITATIONS: cleaning and community activity  PERSONAL FACTORS: 3+ comorbidities: see above history are also affecting patient's functional outcome.   REHAB POTENTIAL: Good  CLINICAL DECISION MAKING: Stable/uncomplicated  EVALUATION COMPLEXITY: Low   GOALS: Goals reviewed with patient? Yes  SHORT TERM GOALS: (target date for Short term goals are 3 weeks 10/06/22)   1.  Patient will demonstrate independent use of home exercise program to maintain progress from in clinic treatments.  Goal status: MET 10/15/22  LONG TERM GOALS: (target dates for all long term goals are 10 weeks  11/23/22 )   1. Patient will demonstrate/report pain at worst less than or equal to 2/10 to facilitate minimal limitation in daily activity secondary to pain symptoms.  Goal status: On-going 11/14/22   2. Patient will demonstrate independent use of home exercise program to facilitate ability to  maintain/progress functional gains from skilled physical therapy services.  Goal status: On-going 11/14/22   3. Patient will demonstrate FOTO outcome > or = 62 % to indicate reduced disability due to condition.  Goal status: New   4.  Pt will be able to amb up and down 1 flight of stairs with single hand rail with step over step gait pattern with no pain reported.   Goal status: On-going 11/14/22   5.  Patient will be able to improve her 5 time sit to stand to </= 12 seconds with no UE support for improved functional mobility   Goal status: MET 10/23/22   6.  Patient will be able to report able 20 minutes with no pain in bil knees or Rt ankle.   Goal status: On-going 11/14/22    PLAN:  PT FREQUENCY: 1-2x/week  PT DURATION: 10 weeks  PLANNED INTERVENTIONS: Therapeutic exercises, Therapeutic activity, Neuro Muscular re-education, Balance training, Gait training, Patient/Family education, Joint mobilization, Stair training, DME instructions, Dry Needling, Electrical stimulation, Traction, Cryotherapy, vasopneumatic deviceMoist heat, Taping, Ultrasound, Ionotophoresis 4mg /ml Dexamethasone, and Manual therapy.  All included unless contraindicated  PLAN FOR NEXT SESSION:   Response to aquatic therapy, taping as needed general LE strengthening, and functional mobility training, ankle stability   Ivery QualeBrian Ashawn Rinehart, PT, DPT 11/16/22 9:27 AM

## 2022-11-19 ENCOUNTER — Encounter: Payer: Medicare Other | Admitting: Physical Therapy

## 2022-11-19 ENCOUNTER — Telehealth: Payer: Self-pay | Admitting: Physical Therapy

## 2022-11-19 NOTE — Telephone Encounter (Signed)
I called pt to follow up about her missed appointment today at 8:45 am this morning. Pt stating the appointment just slipped her mind. Pt was reschedule for Wednesday, 11/21/22 at 8:45 am.    Narda Amber, PT, MPT 11/19/22 11:48 AM

## 2022-11-21 ENCOUNTER — Encounter: Payer: Self-pay | Admitting: Physical Therapy

## 2022-11-21 ENCOUNTER — Ambulatory Visit: Payer: Medicare Other | Admitting: Physical Therapy

## 2022-11-21 DIAGNOSIS — M25561 Pain in right knee: Secondary | ICD-10-CM

## 2022-11-21 DIAGNOSIS — G8929 Other chronic pain: Secondary | ICD-10-CM

## 2022-11-21 DIAGNOSIS — R262 Difficulty in walking, not elsewhere classified: Secondary | ICD-10-CM

## 2022-11-21 DIAGNOSIS — F331 Major depressive disorder, recurrent, moderate: Secondary | ICD-10-CM | POA: Diagnosis not present

## 2022-11-21 DIAGNOSIS — M25562 Pain in left knee: Secondary | ICD-10-CM | POA: Diagnosis not present

## 2022-11-21 DIAGNOSIS — F4311 Post-traumatic stress disorder, acute: Secondary | ICD-10-CM | POA: Diagnosis not present

## 2022-11-21 NOTE — Therapy (Signed)
OUTPATIENT PHYSICAL THERAPY TREATMENT   RECERTIFICATION  Patient Name: Jessica CraverDorothy Josephine Roberts MRN: [id number] DOB:Feb 13, 1946, 77 y.o., female Today's Date: 11/21/2022     END OF SESSION:   PT End of Session - 11/21/22 0859     Visit Number 12    Number of Visits 20    Date for PT Re-Evaluation 01/18/23    Progress Note Due on Visit 20    PT Start Time 0853    PT Stop Time 0932    PT Time Calculation (min) 39 min    Activity Tolerance Patient tolerated treatment well    Behavior During Therapy [facility] for tasks assessed/performed                  Past Medical History:  Diagnosis Date   Allergy    Anxiety    Anxiety and depression    Breast cancer 04/07/15   right breast   Breast cancer of upper-outer quadrant of right female breast 04/11/2015   Broken nose    Complication of anesthesia    "had a very hard time waking up"   Dengue fever    Depression    Fall    broken nose   GERD (gastroesophageal reflux disease)    Head injury 1995   concussion in [place], med evac to [place], ear damage with vertigo   Head injury, acute, with loss of consciousness 2000   [place] the [place]   Head injury, closed, with concussion 1986   [place]   Headache    Hypertension    Malaria    PONV (postoperative nausea and vomiting)    Typhoid    Past Surgical History:  Procedure Laterality Date   BREAST LUMPECTOMY Right 04/27/2015   BREAST LUMPECTOMY WITH NEEDLE LOCALIZATION AND AXILLARY SENTINEL LYMPH NODE BX Right 04/27/2015   Procedure: RIGHT BREAST LUMPECTOMY WITH  TWO NEEDLE LOCALIZATION AND TWO RIGHT AXILLARY SENTINEL LYMPH NODE BX;  Surgeon: Claud KelpHaywood Ingram, MD;  Location: [facility] OR;  Service: General;  Laterality: Right;   COLONOSCOPY W/ BIOPSIES AND POLYPECTOMY     NERVE SURGERY     '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' during surgery for endometriosis   surgical procedure for endometriosis     TONSILLECTOMY     Patient Active Problem List   Diagnosis Date Noted    Early stage nonexudative age-related macular degeneration of both eyes 11/27/2021   Choroidal nevus, right 11/27/2021   Nuclear sclerotic cataract of both eyes 11/27/2021   Iron deficiency 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 (chronic) 07/19/2020   Breast cancer of upper-outer quadrant of right female breast 04/11/2015     THERAPY DIAG:  Chronic pain of right knee  Chronic pain of left knee  Difficulty in walking, not elsewhere classified   PCP: Mosetta PuttBlomgren, Peter, MD   REFERRING PROVIDER:  Kathryne HitchBlackman, Christopher Y, MD   REFERRING DIAG:  Diagnosis  M25.561,M25.562 (ICD-10-CM) - Pain in both knees, unspecified chronicity    EVAL THERAPY DIAG:  Chronic pain of right knee  Chronic pain of left knee  Localized edema  Difficulty in walking, not elsewhere classified  Muscle weakness (generalized)  Pain in right ankle and joints of right foot  Rationale for Evaluation and Treatment: Rehabilitation  ONSET DATE: years ago   SUBJECTIVE STATEMENT: Pt reporting more pain in her Left knee today than her right   PERTINENT HISTORY: Allergies, breast CA, broken nose, h/o fall, depression, anxiety, head injury, HTN, breast lumpectomy  PAIN:  NPRS scale/location: 3/10 pain in  Rt knee, 4-5/10 pain in Left knee Pain description: achy, tightness, soreness Aggravating factors: up and down stairs, when transitioning from sit to stand Relieving factors: Tylenol, Voltaran gel  PRECAUTIONS: None  WEIGHT BEARING RESTRICTIONS: No  FALLS:  Has patient fallen in last 6 months? No  LIVING ENVIRONMENT: Lives with: lives with their family and lives alone Lives in: House/apartment Stairs: Yes: External: 4 steps; bilateral but cannot reach both Has following equipment at home: None  OCCUPATION: retired  PLOF: Independent  PATIENT GOALS: Get up and down out of recliner with no pain or stiffness     Stop knee from "giving out" when going up steps   OBJECTIVE:   DIAGNOSTIC FINDINGS:  Result Narrative from 09/05/22  2 views of the right knee show just slight medial joint space narrowing.   There is otherwise no acute findings.  PATIENT SURVEYS:  09/11/22: FOTO intake: 49   predicted:  62  MUSCLE LENGTH: 09/11/22: Hamstrings: Right 40 deg; Left 45 deg   POSTURE: rounded shoulders and forward head, decreased lumbar lordosis  PALPATION: No pin point tenderness note at eval  LOWER EXTREMITY ROM:   ROM Right 09/11/22 Left 09/11/22 Rt 10/15/22 Rt 11/14/22 Left 11/14/22  Hip flexion 100 106     Hip extension       Hip abduction       Hip adduction       Hip internal rotation       Hip external rotation       Knee flexion 124 130  A: 142 A: 138  Knee extension 0 0  A: 0 A: 0  Ankle dorsiflexion 2 20 A: 6 P: 10 A:10 P: 12   Ankle plantarflexion 18 32 A: 30 P: 35 A: 32 P: 36   Ankle inversion 34 40 A: 42 P: 42 A: 40 P: 42   Ankle eversion 30 34 A: 30 P: 45 A: 32 P: 44    (Blank rows = not tested)  LOWER EXTREMITY MMT:  MMT Right Eval 09/11/22 Left Eval 09/11/22 Rt 10/23/22 Left / Rt 11/21/22   Hip flexion 4/5 5/5 4+ 5 / 5  Hip extension      Hip abduction 4/5 4+/5 4+ 4+ / 4+  Hip adduction 4/5 4+/5 4+ 4+ / 4+  Hip internal rotation      Hip external rotation      Knee flexion 5/5 5/5 5 5  / 5  Knee extension 5/5 5/5 5 5  / 5  Ankle dorsiflexion 4/5 5/5 4+ 5 / 4+  Ankle plantarflexion 4/5 5/5 4+ 5 / 4+  Ankle inversion 4/5 5/5 4+ 5 / 4+  Ankle eversion 4/5 5/5 4+    (Blank rows = not tested)    FUNCTIONAL TESTS:  11/21/22:   5 time sit to stand: 10 seconds  with no UE support  10/23/22: 5 time sit to stand: 12 seconds with UE support      5 time sit to stand: 10 seconds  with no UE support   09/11/22: 5 time sit to stand: 16 seconds no UE support  GAIT: Distance walked: 30 feet  Assistive device utilized: None Level of assistance: Complete Independence Comments: mild decreased heel strike in Rt LE   TODAY'S TREATMENT  DATE: 11/21/22 TherEx:  Nustep: x 8 minutes Level 5 Calf stretch on slant board: 30 sec x 2 Standing hip abduction: x 15 c UE support Standing hip extension x 15 c UE support NMR:  Standing feet together on Airex: x 60 sec c finger tap support Standing on Airex stagger stance x 60 sec c finger tap support leading with each LE SLS level surface: 2 x 15 sec each LE c intermittent UE support Manual:  K-tape: in "C" pattern with strip across patella for support      11/16/22 Pt seen for aquatic therapy today.  Treatment took place in water 3.5-4.75 ft in depth at the [facility] pool. Temp of water was 91.  Pt entered/exited the pool via stairs with rail.  Pt requires the buoyancy and hydrostatic pressure of water for support, and to offload joints by unweighting joint load by at least 50 % in navel deep water and by at least 75-80% in chest to neck deep water.  Viscosity of the water is needed for resistance of strengthening. Water current perturbations provides challenge to standing balance requiring increased core activation. Water exercises: Lateral walking, forward walking, retro walking 3 round trips in pool horizontal (30 feet) Leg swings add/abd X 20 bilat with UE support cues for slow and controlled movement Leg swings flexion/extension X 20 bilat with UE support cues for slow and controlled movements Standing marches with UE support X 20 bilat Standing hamstring curls with UE support X 20 bilat 1/4 squat isometric hold with row/chest press holding kickboard X 20 reps 1/4 squat with shoulder extension/flexion holding kickboard  X20 reps Walking mini lunges with UE support on floating dumbells 3 round trips Lateral walking with mini squat with bilateral shoulder abd/add with floating dumbells 3 round trips 1/4 lunge with shoulder flexion/extension using floating dumbells X 15 bilat Hip extensions X 10 with yellow noodle  under knee and back against pool wall, X 10 bilat Sit to stands from pool bench with step underneath 2X10 Bicycle kicks seated on bench  X30 Hip abd/add kicks seated on bench X 30 Bilat hip/knee flexion/extension aka reverse crunch X 15 Standing gastroc stretch at edge of step 30 sec X 2 bilat   11/14/22:  TherEx:  Calf stretch x 3 holding 30 sec Sit to stand: x 10 no UE support (pt reporting increased pain in her left knee) Hip abduction: x 15 Pt's HEP was updated and all exercises reviewed Manual:  K-tape: in "C" pattern with strip across patella for support bilateral LE's      PATIENT EDUCATION:  Education details: HEP, POC Person educated: Patient Education method: Programmer, multimedia, Demonstration, Verbal cues, and Handouts Education comprehension: verbalized understanding, returned demonstration, and verbal cues required  HOME EXERCISE PROGRAM: Access Code: [email or web address] URL: [email or web address] Date: 11/14/2022 Prepared by: Narda Amber  Exercises - Supine Bridge  - 2 x daily - 2 sets - 10 reps - 5 seconds hold - Hooklying Hamstring Stretch with Strap  - 2 x daily - 3 reps - 30 seconds hold - Seated Hamstring Stretch  - 2 x daily - 3 reps - 30 seconds hold - Seated Ankle Alphabet  - 2 x daily - 1 reps - Heel Raises with Counter Support  - 2 x daily - 2 sets - 10 reps - Ankle Dorsiflexion with Resistance  - 1-2 x daily - 7 x weekly - 2 sets - 10 reps - Ankle Inversion with Resistance  - 1-2 x daily - 7 x weekly -  2 sets - 10 reps - Ankle Eversion with Resistance  - 1-2 x daily - 7 x weekly - 2 sets - 10 reps - Ankle and Toe Plantarflexion with Resistance  - 1-2 x daily - 7 x weekly - 2 sets - 10 reps - Side Stepping with Resistance at Thighs  - 1 x daily - 7 x weekly - 1 reps  ASSESSMENT:  CLINICAL IMPRESSION: Pt arriving a few minutes late to therapy. Pt still reporting bilateral knee pain and reports some days her left knee is hurting worse than her right.  Pt reporting good response to aquatic therapy. Pt's bilateral knee AROM has improved, and presents with some mild strength deficits in Rt hip and ankle. I am requesting 8 additional weeks of skilled PT interventions incorporating both land and aquatic rehab.   OBJECTIVE IMPAIRMENTS: decreased activity tolerance, difficulty walking, decreased ROM, decreased strength, and pain.   ACTIVITY LIMITATIONS: standing, squatting, stairs, and transfers  PARTICIPATION LIMITATIONS: cleaning and community activity  PERSONAL FACTORS: 3+ comorbidities: see above history are also affecting patient's functional outcome.   REHAB POTENTIAL: Good  CLINICAL DECISION MAKING: Stable/uncomplicated  EVALUATION COMPLEXITY: Low   GOALS: Goals reviewed with patient? Yes  SHORT TERM GOALS: (target date for Short term goals are 3 weeks 10/06/22)   1.  Patient will demonstrate independent use of home exercise program to maintain progress from in clinic treatments.  Goal status: MET 10/15/22  LONG TERM GOALS: (target dates for all long term goals are 10 weeks  11/23/22 )   1. Patient will demonstrate/report pain at worst less than or equal to 2/10 to facilitate minimal limitation in daily activity secondary to pain symptoms.  Goal status: On-going 11/21/22   2. Patient will demonstrate independent use of home exercise program to facilitate ability to maintain/progress functional gains from skilled physical therapy services.  Goal status: On-going 11/21/22   3. Patient will demonstrate FOTO outcome > or = 62 % to indicate reduced disability due to condition.  Goal status: New   4.  Pt will be able to amb up and down 1 flight of stairs with single hand rail with step over step gait pattern with no pain reported.   Goal status: On-going 11/14/22   5.  Patient will be able to improve her 5 time sit to stand to </= 12 seconds with no UE support for improved functional mobility   Goal status: MET 10/23/22   6.   Patient will be able to report able 20 minutes with no pain in bil knees or Rt ankle.   Goal status: On-going 11/21/22    PLAN:  PT FREQUENCY: 1-2x/week  PT DURATION: 10 weeks  PLANNED INTERVENTIONS: Therapeutic exercises, Therapeutic activity, Neuro Muscular re-education, Balance training, Gait training, Patient/Family education, Joint mobilization, Stair training, DME instructions, Dry Needling, Electrical stimulation, Traction, Cryotherapy, vasopneumatic deviceMoist heat, Taping, Ultrasound, Ionotophoresis 4mg /ml Dexamethasone, and Manual therapy.  All included unless contraindicated  PLAN FOR NEXT SESSION:   taping as needed general LE strengthening, and functional mobility training, ankle stability, alternating land and water therapies   Narda Amber, PT, MPT 11/21/22 10:00 AM   11/21/22 10:00 AM

## 2022-11-23 ENCOUNTER — Ambulatory Visit (INDEPENDENT_AMBULATORY_CARE_PROVIDER_SITE_OTHER): Payer: Medicare Other | Admitting: Physical Therapy

## 2022-11-23 ENCOUNTER — Encounter: Payer: Self-pay | Admitting: Physical Therapy

## 2022-11-23 DIAGNOSIS — R600 Localized edema: Secondary | ICD-10-CM | POA: Diagnosis not present

## 2022-11-23 DIAGNOSIS — M6281 Muscle weakness (generalized): Secondary | ICD-10-CM

## 2022-11-23 DIAGNOSIS — M25562 Pain in left knee: Secondary | ICD-10-CM | POA: Diagnosis not present

## 2022-11-23 DIAGNOSIS — R262 Difficulty in walking, not elsewhere classified: Secondary | ICD-10-CM | POA: Diagnosis not present

## 2022-11-23 DIAGNOSIS — M25561 Pain in right knee: Secondary | ICD-10-CM

## 2022-11-23 DIAGNOSIS — G8929 Other chronic pain: Secondary | ICD-10-CM

## 2022-11-23 DIAGNOSIS — M25571 Pain in right ankle and joints of right foot: Secondary | ICD-10-CM

## 2022-11-23 NOTE — Therapy (Signed)
OUTPATIENT PHYSICAL THERAPY TREATMENT   Patient Name: Jessica Roberts MRN: [id number] DOB:07-14-1946, 77 y.o., female Today's Date: 11/23/2022     END OF SESSION:   PT End of Session - 11/23/22 0858     Visit Number 11    Number of Visits 20    Date for PT Re-Evaluation 01/18/23    Progress Note Due on Visit 20    PT Start Time 0847    PT Stop Time 0930    PT Time Calculation (min) 43 min    Activity Tolerance Patient tolerated treatment well    Behavior During Therapy [facility] for tasks assessed/performed                  Past Medical History:  Diagnosis Date   Allergy    Anxiety    Anxiety and depression    Breast cancer 04/07/15   right breast   Breast cancer of upper-outer quadrant of right female breast 04/11/2015   Broken nose    Complication of anesthesia    "had a very hard time waking up"   Dengue fever    Depression    Fall    broken nose   GERD (gastroesophageal reflux disease)    Head injury 1995   concussion in [place], med evac to [place], ear damage with vertigo   Head injury, acute, with loss of consciousness 2000   [place]   Head injury, closed, with concussion 1986   [place]   Headache    Hypertension    Malaria    PONV (postoperative nausea and vomiting)    Typhoid    Past Surgical History:  Procedure Laterality Date   BREAST LUMPECTOMY Right 04/27/2015   BREAST LUMPECTOMY WITH NEEDLE LOCALIZATION AND AXILLARY SENTINEL LYMPH NODE BX Right 04/27/2015   Procedure: RIGHT BREAST LUMPECTOMY WITH  TWO NEEDLE LOCALIZATION AND TWO RIGHT AXILLARY SENTINEL LYMPH NODE BX;  Surgeon: Claud Kelp, MD;  Location: [facility] OR;  Service: General;  Laterality: Right;   COLONOSCOPY W/ BIOPSIES AND POLYPECTOMY     NERVE SURGERY     '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' during surgery for endometriosis   surgical procedure for endometriosis     TONSILLECTOMY     Patient Active Problem List   Diagnosis Date Noted   Early stage  nonexudative age-related macular degeneration of both eyes 11/27/2021   Choroidal nevus, right 11/27/2021   Nuclear sclerotic cataract of both eyes 11/27/2021   Iron deficiency 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 (chronic) 07/19/2020   Breast cancer of upper-outer quadrant of right female breast 04/11/2015     THERAPY DIAG:  Chronic pain of right knee  Chronic pain of left knee  Difficulty in walking, not elsewhere classified  Localized edema  Pain in right ankle and joints of right foot  Muscle weakness (generalized)   PCP: [name], MD   REFERRING PROVIDER:  Kathryne Hitch, MD   REFERRING DIAG:  Diagnosis  M25.561,M25.562 (ICD-10-CM) - Pain in both knees, unspecified chronicity    EVAL THERAPY DIAG:  Chronic pain of right knee  Chronic pain of left knee  Localized edema  Difficulty in walking, not elsewhere classified  Muscle weakness (generalized)  Pain in right ankle and joints of right foot  Rationale for Evaluation and Treatment: Rehabilitation  ONSET DATE: years ago   SUBJECTIVE STATEMENT: Pt reporting some inferior knee pain upon arrival, a catch in her left hip at time  PERTINENT HISTORY: Allergies, breast CA, broken  nose, h/o fall, depression, anxiety, head injury, HTN, breast lumpectomy  PAIN:  NPRS scale/location: 1/10 pain in knees/hips Aggravating factors: up and down stairs, when transitioning from sit to stand Relieving factors: Tylenol, Voltaran gel  PRECAUTIONS: None  WEIGHT BEARING RESTRICTIONS: No  FALLS:  Has patient fallen in last 6 months? No  LIVING ENVIRONMENT: Lives with: lives with their family and lives alone Lives in: House/apartment Stairs: Yes: External: 4 steps; bilateral but cannot reach both Has following equipment at home: None  OCCUPATION: retired  PLOF: Independent  PATIENT GOALS: Get up and down out of recliner with no pain or stiffness     Stop knee from "giving out" when going up  steps   OBJECTIVE:   DIAGNOSTIC FINDINGS: Result Narrative from 09/05/22  2 views of the right knee show just slight medial joint space narrowing.   There is otherwise no acute findings.  PATIENT SURVEYS:  09/11/22: FOTO intake: 49   predicted:  62  MUSCLE LENGTH: 09/11/22: Hamstrings: Right 40 deg; Left 45 deg   POSTURE: rounded shoulders and forward head, decreased lumbar lordosis  PALPATION: No pin point tenderness note at eval  LOWER EXTREMITY ROM:   ROM Right 09/11/22 Left 09/11/22 Rt 10/15/22 Rt 11/14/22 Left 11/14/22  Hip flexion 100 106     Hip extension       Hip abduction       Hip adduction       Hip internal rotation       Hip external rotation       Knee flexion 124 130  A: 142 A: 138  Knee extension 0 0  A: 0 A: 0  Ankle dorsiflexion 2 20 A: 6 P: 10 A:10 P: 12   Ankle plantarflexion 18 32 A: 30 P: 35 A: 32 P: 36   Ankle inversion 34 40 A: 42 P: 42 A: 40 P: 42   Ankle eversion 30 34 A: 30 P: 45 A: 32 P: 44    (Blank rows = not tested)  LOWER EXTREMITY MMT:  MMT Right Eval 09/11/22 Left Eval 09/11/22 Rt 10/23/22 Left / Rt 11/21/22   Hip flexion 4/5 5/5 4+ 5 / 5  Hip extension      Hip abduction 4/5 4+/5 4+ 4+ / 4+  Hip adduction 4/5 4+/5 4+ 4+ / 4+  Hip internal rotation      Hip external rotation      Knee flexion 5/5 5/5 5 5  / 5  Knee extension 5/5 5/5 5 5  / 5  Ankle dorsiflexion 4/5 5/5 4+ 5 / 4+  Ankle plantarflexion 4/5 5/5 4+ 5 / 4+  Ankle inversion 4/5 5/5 4+ 5 / 4+  Ankle eversion 4/5 5/5 4+    (Blank rows = not tested)    FUNCTIONAL TESTS:  11/21/22:   5 time sit to stand: 10 seconds  with no UE support  10/23/22: 5 time sit to stand: 12 seconds with UE support      5 time sit to stand: 10 seconds  with no UE support   09/11/22: 5 time sit to stand: 16 seconds no UE support  GAIT: Distance walked: 30 feet  Assistive device utilized: None Level of assistance: Complete Independence Comments: mild decreased heel strike in Rt  LE   TODAY'S TREATMENT  DATE: 4/512/24 Pt seen for aquatic therapy today.  Treatment took place in water 3.5-4.75 ft in depth at the [facility] pool. Temp of water was 91.  Pt entered/exited the pool via stairs with rail.  Pt requires the buoyancy and hydrostatic pressure of water for support, and to offload joints by unweighting joint load by at least 50 % in navel deep water and by at least 75-80% in chest to neck deep water.  Viscosity of the water is needed for resistance of strengthening. Water current perturbations provides challenge to standing balance requiring increased core activation. Water exercises: Lateral walking, forward walking, retro walking 3 round trips in pool horizontal (30 feet) Leg swings add/abd X 15 bilat with UE support cues for slow and controlled movement Leg swings flexion/extension X 15 bilat with UE support cues for slow and controlled movements Standing marches with UE support X 15 bilat Standing hamstring curls with UE support X 15 bilat 1/4 squat isometric hold with row/chest press holding kickboard X 15 reps 1/4 squat with shoulder extension/flexion holding kickboard  X20 reps Lateral walking with mini squat with bilateral shoulder abd/add with floating dumbells 2 round trips Hip extensions X 10 with yellow noodle under knee and back against pool wall, X 15 bilat Step ups onto first step in pool X 15 bilat Squats on first step of pool X 10 reps KT tape bilateral knees for knee/patella stability  11/21/22 TherEx:  Nustep: x 8 minutes Level 5 Calf stretch on slant board: 30 sec x 2 Standing hip abduction: x 15 c UE support Standing hip extension x 15 c UE support NMR:  Standing feet together on Airex: x 60 sec c finger tap support Standing on Airex stagger stance x 60 sec c finger tap support leading with each LE SLS level surface: 2 x 15 sec each LE c intermittent UE  support Manual:  K-tape: in "C" pattern with strip across patella for support        PATIENT EDUCATION:  Education details: HEP, POC Person educated: Patient Education method: Programmer, multimedia, Demonstration, Verbal cues, and Handouts Education comprehension: verbalized understanding, returned demonstration, and verbal cues required  HOME EXERCISE PROGRAM: Access Code: [email or web address] URL: [email or web address] Date: 11/14/2022 Prepared by: Narda Amber  Exercises - Supine Bridge  - 2 x daily - 2 sets - 10 reps - 5 seconds hold - Hooklying Hamstring Stretch with Strap  - 2 x daily - 3 reps - 30 seconds hold - Seated Hamstring Stretch  - 2 x daily - 3 reps - 30 seconds hold - Seated Ankle Alphabet  - 2 x daily - 1 reps - Heel Raises with Counter Support  - 2 x daily - 2 sets - 10 reps - Ankle Dorsiflexion with Resistance  - 1-2 x daily - 7 x weekly - 2 sets - 10 reps - Ankle Inversion with Resistance  - 1-2 x daily - 7 x weekly - 2 sets - 10 reps - Ankle Eversion with Resistance  - 1-2 x daily - 7 x weekly - 2 sets - 10 reps - Ankle and Toe Plantarflexion with Resistance  - 1-2 x daily - 7 x weekly - 2 sets - 10 reps - Side Stepping with Resistance at Thighs  - 1 x daily - 7 x weekly - 1 reps  ASSESSMENT:  CLINICAL IMPRESSION: She relays she was fine with intensity of pool from last session so we continued with her aquatic PT exercises with good overall tolerance to this. She  does express some difficulty with squatting on land so we worked on this more in pool which reduces the difficulty and she did great with this. We will work to gradually increase the difficulty in efforts to make this functional task less difficult for her.  OBJECTIVE IMPAIRMENTS: decreased activity tolerance, difficulty walking, decreased ROM, decreased strength, and pain.   ACTIVITY LIMITATIONS: standing, squatting, stairs, and transfers  PARTICIPATION LIMITATIONS: cleaning and community  activity  PERSONAL FACTORS: 3+ comorbidities: see above history are also affecting patient's functional outcome.   REHAB POTENTIAL: Good  CLINICAL DECISION MAKING: Stable/uncomplicated  EVALUATION COMPLEXITY: Low   GOALS: Goals reviewed with patient? Yes  SHORT TERM GOALS: (target date for Short term goals are 3 weeks 10/06/22)   1.  Patient will demonstrate independent use of home exercise program to maintain progress from in clinic treatments.  Goal status: MET 10/15/22  LONG TERM GOALS: (target dates for all long term goals are 10 weeks  11/23/22 )   1. Patient will demonstrate/report pain at worst less than or equal to 2/10 to facilitate minimal limitation in daily activity secondary to pain symptoms.  Goal status: On-going 11/21/22   2. Patient will demonstrate independent use of home exercise program to facilitate ability to maintain/progress functional gains from skilled physical therapy services.  Goal status: On-going 11/21/22   3. Patient will demonstrate FOTO outcome > or = 62 % to indicate reduced disability due to condition.  Goal status: New   4.  Pt will be able to amb up and down 1 flight of stairs with single hand rail with step over step gait pattern with no pain reported.   Goal status: On-going 11/14/22   5.  Patient will be able to improve her 5 time sit to stand to </= 12 seconds with no UE support for improved functional mobility   Goal status: MET 10/23/22   6.  Patient will be able to report able 20 minutes with no pain in bil knees or Rt ankle.   Goal status: On-going 11/21/22    PLAN:  PT FREQUENCY: 1-2x/week  PT DURATION: 10 weeks  PLANNED INTERVENTIONS: Therapeutic exercises, Therapeutic activity, Neuro Muscular re-education, Balance training, Gait training, Patient/Family education, Joint mobilization, Stair training, DME instructions, Dry Needling, Electrical stimulation, Traction, Cryotherapy, vasopneumatic deviceMoist heat, Taping,  Ultrasound, Ionotophoresis /ml Dexamethasone, and Manual therapy.  All included unless contraindicated  PLAN FOR NEXT SESSION:   taping as needed general LE strengthening, and functional mobility training, ankle stability, alternating land and water therapies   Ivery Quale, PT, DPT 11/23/22 8:59 AM

## 2022-11-26 ENCOUNTER — Ambulatory Visit: Payer: Medicare Other | Admitting: Physician Assistant

## 2022-11-27 ENCOUNTER — Encounter: Payer: Medicare Other | Admitting: Physical Therapy

## 2022-11-28 ENCOUNTER — Encounter (INDEPENDENT_AMBULATORY_CARE_PROVIDER_SITE_OTHER): Payer: Medicare Other | Admitting: Ophthalmology

## 2022-12-07 ENCOUNTER — Ambulatory Visit (INDEPENDENT_AMBULATORY_CARE_PROVIDER_SITE_OTHER): Payer: Medicare Other | Admitting: Physical Therapy

## 2022-12-07 ENCOUNTER — Encounter: Payer: Self-pay | Admitting: Physical Therapy

## 2022-12-07 DIAGNOSIS — M25571 Pain in right ankle and joints of right foot: Secondary | ICD-10-CM

## 2022-12-07 DIAGNOSIS — M25561 Pain in right knee: Secondary | ICD-10-CM | POA: Diagnosis not present

## 2022-12-07 DIAGNOSIS — M6281 Muscle weakness (generalized): Secondary | ICD-10-CM

## 2022-12-07 DIAGNOSIS — M25562 Pain in left knee: Secondary | ICD-10-CM

## 2022-12-07 DIAGNOSIS — R262 Difficulty in walking, not elsewhere classified: Secondary | ICD-10-CM | POA: Diagnosis not present

## 2022-12-07 DIAGNOSIS — G8929 Other chronic pain: Secondary | ICD-10-CM

## 2022-12-07 DIAGNOSIS — R600 Localized edema: Secondary | ICD-10-CM | POA: Diagnosis not present

## 2022-12-07 NOTE — Therapy (Signed)
OUTPATIENT PHYSICAL THERAPY TREATMENT   Patient Name: Jessica Roberts MRN: [id number] DOB:October 21, 1945, 77 y.o., female Today's Date: 12/07/2022     END OF SESSION:   PT End of Session - 12/07/22 0808     Visit Number 12    Number of Visits 20    Date for PT Re-Evaluation 01/18/23    Progress Note Due on Visit 20    PT Start Time 0800    PT Stop Time 0843    PT Time Calculation (min) 43 min    Activity Tolerance Patient tolerated treatment well    Behavior During Therapy [facility] for tasks assessed/performed                   Past Medical History:  Diagnosis Date   Allergy    Anxiety    Anxiety and depression    Breast cancer (HCC) 04/07/15   right breast   Breast cancer of upper-outer quadrant of right female breast (HCC) 04/11/2015   Broken nose    Complication of anesthesia    "had a very hard time waking up"   Dengue fever    Depression    Fall    broken nose   GERD (gastroesophageal reflux disease)    Head injury 1995   concussion in [place], med evac to [place], ear damage with vertigo   Head injury, acute, with loss of consciousness (HCC) 2000   [place]   Head injury, closed, with concussion 1986   [place]   Headache    Hypertension    Malaria    PONV (postoperative nausea and vomiting)    Typhoid    Past Surgical History:  Procedure Laterality Date   BREAST LUMPECTOMY Right 04/27/2015   BREAST LUMPECTOMY WITH NEEDLE LOCALIZATION AND AXILLARY SENTINEL LYMPH NODE BX Right 04/27/2015   Procedure: RIGHT BREAST LUMPECTOMY WITH  TWO NEEDLE LOCALIZATION AND TWO RIGHT AXILLARY SENTINEL LYMPH NODE BX;  Surgeon: Claud Kelp, MD;  Location: [facility] OR;  Service: General;  Laterality: Right;   COLONOSCOPY W/ BIOPSIES AND POLYPECTOMY     NERVE SURGERY     '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' during surgery for endometriosis   surgical procedure for endometriosis     TONSILLECTOMY     Patient Active Problem List   Diagnosis Date Noted    Early stage nonexudative age-related macular degeneration of both eyes 11/27/2021   Choroidal nevus, right 11/27/2021   Nuclear sclerotic cataract of both eyes 11/27/2021   Iron deficiency 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 (chronic) 07/19/2020   Breast cancer of upper-outer quadrant of right female breast (HCC) 04/11/2015     THERAPY DIAG:  Chronic pain of right knee  Chronic pain of left knee  Difficulty in walking, not elsewhere classified  Localized edema  Pain in right ankle and joints of right foot  Muscle weakness (generalized)   PCP: [name], MD   REFERRING PROVIDER:  Kathryne Hitch, MD   REFERRING DIAG:  Diagnosis  M25.561,M25.562 (ICD-10-CM) - Pain in both knees, unspecified chronicity    EVAL THERAPY DIAG:  Chronic pain of right knee  Chronic pain of left knee  Localized edema  Difficulty in walking, not elsewhere classified  Muscle weakness (generalized)  Pain in right ankle and joints of right foot  Rationale for Evaluation and Treatment: Rehabilitation  ONSET DATE: years ago   SUBJECTIVE STATEMENT: Pt reporting he is actually not having any knee pain upon arrival, does still have some mild ankle  pain  PERTINENT HISTORY: Allergies, breast CA, broken nose, h/o fall, depression, anxiety, head injury, HTN, breast lumpectomy  PAIN:  NPRS scale/location: 1/10 pain in ankle Aggravating factors: up and down stairs, when transitioning from sit to stand Relieving factors: Tylenol, Voltaran gel  PRECAUTIONS: None  WEIGHT BEARING RESTRICTIONS: No  FALLS:  Has patient fallen in last 6 months? No  LIVING ENVIRONMENT: Lives with: lives with their family and lives alone Lives in: House/apartment Stairs: Yes: External: 4 steps; bilateral but cannot reach both Has following equipment at home: None  OCCUPATION: retired  PLOF: Independent  PATIENT GOALS: Get up and down out of recliner with no pain or stiffness     Stop knee from  "giving out" when going up steps   OBJECTIVE:   DIAGNOSTIC FINDINGS: Result Narrative from 09/05/22  2 views of the right knee show just slight medial joint space narrowing.   There is otherwise no acute findings.  PATIENT SURVEYS:  09/11/22: FOTO intake: 49   predicted:  62  MUSCLE LENGTH: 09/11/22: Hamstrings: Right 40 deg; Left 45 deg   POSTURE: rounded shoulders and forward head, decreased lumbar lordosis  PALPATION: No pin point tenderness note at eval  LOWER EXTREMITY ROM:   ROM Right 09/11/22 Left 09/11/22 Rt 10/15/22 Rt 11/14/22 Left 11/14/22  Hip flexion 100 106     Hip extension       Hip abduction       Hip adduction       Hip internal rotation       Hip external rotation       Knee flexion 124 130  A: 142 A: 138  Knee extension 0 0  A: 0 A: 0  Ankle dorsiflexion 2 20 A: 6 P: 10 A:10 P: 12   Ankle plantarflexion 18 32 A: 30 P: 35 A: 32 P: 36   Ankle inversion 34 40 A: 42 P: 42 A: 40 P: 42   Ankle eversion 30 34 A: 30 P: 45 A: 32 P: 44    (Blank rows = not tested)  LOWER EXTREMITY MMT:  MMT Right Eval 09/11/22 Left Eval 09/11/22 Rt 10/23/22 Left / Rt 11/21/22   Hip flexion 4/5 5/5 4+ 5 / 5  Hip extension      Hip abduction 4/5 4+/5 4+ 4+ / 4+  Hip adduction 4/5 4+/5 4+ 4+ / 4+  Hip internal rotation      Hip external rotation      Knee flexion 5/5 5/5 5 5  / 5  Knee extension 5/5 5/5 5 5  / 5  Ankle dorsiflexion 4/5 5/5 4+ 5 / 4+  Ankle plantarflexion 4/5 5/5 4+ 5 / 4+  Ankle inversion 4/5 5/5 4+ 5 / 4+  Ankle eversion 4/5 5/5 4+    (Blank rows = not tested)    FUNCTIONAL TESTS:  11/21/22:   5 time sit to stand: 10 seconds  with no UE support  10/23/22: 5 time sit to stand: 12 seconds with UE support      5 time sit to stand: 10 seconds  with no UE support   09/11/22: 5 time sit to stand: 16 seconds no UE support  GAIT: Distance walked: 30 feet  Assistive device utilized: None Level of assistance: Complete Independence Comments: mild  decreased heel strike in Rt LE   TODAY'S TREATMENT  DATE: 12/07/22 Pt seen for aquatic therapy today.  Treatment took place in water 3.5-4.75 ft in depth at the [facility] pool. Temp of water was 91.  Pt entered/exited the pool via stairs with rail.  Pt requires the buoyancy and hydrostatic pressure of water for support, and to offload joints by unweighting joint load by at least 50 % in navel deep water and by at least 75-80% in chest to neck deep water.  Viscosity of the water is needed for resistance of strengthening. Water current perturbations provides challenge to standing balance requiring increased core activation. Water exercises: Lateral walking, forward walking, retro walking 3 round trips in pool horizontal (30 feet) Leg swings add/abd X 15 bilat with UE support cues for slow and controlled movement Leg swings flexion/extension X 15 bilat with UE support cues for slow and controlled movements Standing marches with UE support X 15 bilat Standing hamstring curls with UE support X 15 bilat 1/4 squat isometric hold with row/chest press holding kickboard X 15 reps 1/4 squat with shoulder extension/flexion holding kickboard  X20 reps March walking horizontal width of pool holding dumbells X 3 round trips Monster walks horizontal width of pool holding dumbells X 2 round trips Lateral walking with mini squat with bilateral shoulder abd/add with floating dumbells 2 round trips Seated on bench performing bicycle kicks X 20, hip abd/add X 20, bilat hip/knee flexion/ext X 20 Step ups onto first step in pool X 10 bilat Squats on first step of pool X 15 reps   11/23/22 Pt seen for aquatic therapy today.  Treatment took place in water 3.5-4.75 ft in depth at the [facility] pool. Temp of water was 91.  Pt entered/exited the pool via stairs with rail.  Pt requires the buoyancy and hydrostatic pressure of  water for support, and to offload joints by unweighting joint load by at least 50 % in navel deep water and by at least 75-80% in chest to neck deep water.  Viscosity of the water is needed for resistance of strengthening. Water current perturbations provides challenge to standing balance requiring increased core activation. Water exercises: Lateral walking, forward walking, retro walking 3 round trips in pool horizontal (30 feet) Leg swings add/abd X 15 bilat with UE support cues for slow and controlled movement Leg swings flexion/extension X 15 bilat with UE support cues for slow and controlled movements Standing marches with UE support X 15 bilat Standing hamstring curls with UE support X 15 bilat 1/4 squat isometric hold with row/chest press holding kickboard X 15 reps 1/4 squat with shoulder extension/flexion holding kickboard  X20 reps Lateral walking with mini squat with bilateral shoulder abd/add with floating dumbells 2 round trips Hip extensions X 10 with yellow noodle under knee and back against pool wall, X 15 bilat Step ups onto first step in pool X 15 bilat Squats on first step of pool X 10 reps KT tape bilateral knees for knee/patella stability  11/21/22 TherEx:  Nustep: x 8 minutes Level 5 Calf stretch on slant board: 30 sec x 2 Standing hip abduction: x 15 c UE support Standing hip extension x 15 c UE support NMR:  Standing feet together on Airex: x 60 sec c finger tap support Standing on Airex stagger stance x 60 sec c finger tap support leading with each LE SLS level surface: 2 x 15 sec each LE c intermittent UE support Manual:  K-tape: in "C" pattern with strip across patella for support  PATIENT EDUCATION:  Education details: HEP, POC Person educated: Patient Education method: Programmer, multimedia, Demonstration, Verbal cues, and Handouts Education comprehension: verbalized understanding, returned demonstration, and verbal cues required  HOME EXERCISE  PROGRAM: Access Code: [email or web address] URL: [email or web address] Date: 11/14/2022 Prepared by: Narda Amber  Exercises - Supine Bridge  - 2 x daily - 2 sets - 10 reps - 5 seconds hold - Hooklying Hamstring Stretch with Strap  - 2 x daily - 3 reps - 30 seconds hold - Seated Hamstring Stretch  - 2 x daily - 3 reps - 30 seconds hold - Seated Ankle Alphabet  - 2 x daily - 1 reps - Heel Raises with Counter Support  - 2 x daily - 2 sets - 10 reps - Ankle Dorsiflexion with Resistance  - 1-2 x daily - 7 x weekly - 2 sets - 10 reps - Ankle Inversion with Resistance  - 1-2 x daily - 7 x weekly - 2 sets - 10 reps - Ankle Eversion with Resistance  - 1-2 x daily - 7 x weekly - 2 sets - 10 reps - Ankle and Toe Plantarflexion with Resistance  - 1-2 x daily - 7 x weekly - 2 sets - 10 reps - Side Stepping with Resistance at Thighs  - 1 x daily - 7 x weekly - 1 reps  ASSESSMENT:  CLINICAL IMPRESSION: She had good response from last aquatic session and was not having knee pain today. I did progress her intensity in the pool slightly today with good overall tolerance. PT recommending to continue with current plan.   OBJECTIVE IMPAIRMENTS: decreased activity tolerance, difficulty walking, decreased ROM, decreased strength, and pain.   ACTIVITY LIMITATIONS: standing, squatting, stairs, and transfers  PARTICIPATION LIMITATIONS: cleaning and community activity  PERSONAL FACTORS: 3+ comorbidities: see above history are also affecting patient's functional outcome.   REHAB POTENTIAL: Good  CLINICAL DECISION MAKING: Stable/uncomplicated  EVALUATION COMPLEXITY: Low   GOALS: Goals reviewed with patient? Yes  SHORT TERM GOALS: (target date for Short term goals are 3 weeks 10/06/22)   1.  Patient will demonstrate independent use of home exercise program to maintain progress from in clinic treatments.  Goal status: MET 10/15/22  LONG TERM GOALS: (target dates for all long term goals are 10 weeks   11/23/22 )   1. Patient will demonstrate/report pain at worst less than or equal to 2/10 to facilitate minimal limitation in daily activity secondary to pain symptoms.  Goal status: On-going 11/21/22   2. Patient will demonstrate independent use of home exercise program to facilitate ability to maintain/progress functional gains from skilled physical therapy services.  Goal status: On-going 11/21/22   3. Patient will demonstrate FOTO outcome > or = 62 % to indicate reduced disability due to condition.  Goal status: New   4.  Pt will be able to amb up and down 1 flight of stairs with single hand rail with step over step gait pattern with no pain reported.   Goal status: On-going 11/14/22   5.  Patient will be able to improve her 5 time sit to stand to </= 12 seconds with no UE support for improved functional mobility   Goal status: MET 10/23/22   6.  Patient will be able to report able 20 minutes with no pain in bil knees or Rt ankle.   Goal status: On-going 11/21/22    PLAN:  PT FREQUENCY: 1-2x/week  PT DURATION: 10 weeks  PLANNED INTERVENTIONS: Therapeutic exercises, Therapeutic activity, Neuro Muscular re-education, Balance  training, Gait training, Patient/Family education, Joint mobilization, Stair training, DME instructions, Dry Needling, Electrical stimulation, Traction, Cryotherapy, vasopneumatic deviceMoist heat, Taping, Ultrasound, Ionotophoresis 4mg /ml Dexamethasone, and Manual therapy.  All included unless contraindicated  PLAN FOR NEXT SESSION:   taping as needed general LE strengthening, and functional mobility training, ankle stability   NEEDS FOTO UPDATE  Ivery Quale, PT, DPT 12/07/22 8:09 AM

## 2022-12-14 ENCOUNTER — Encounter: Payer: Self-pay | Admitting: Physical Therapy

## 2022-12-14 ENCOUNTER — Ambulatory Visit (INDEPENDENT_AMBULATORY_CARE_PROVIDER_SITE_OTHER): Payer: Medicare Other | Admitting: Physical Therapy

## 2022-12-14 DIAGNOSIS — R600 Localized edema: Secondary | ICD-10-CM | POA: Diagnosis not present

## 2022-12-14 DIAGNOSIS — M25562 Pain in left knee: Secondary | ICD-10-CM | POA: Diagnosis not present

## 2022-12-14 DIAGNOSIS — M25561 Pain in right knee: Secondary | ICD-10-CM

## 2022-12-14 DIAGNOSIS — R262 Difficulty in walking, not elsewhere classified: Secondary | ICD-10-CM

## 2022-12-14 DIAGNOSIS — G8929 Other chronic pain: Secondary | ICD-10-CM

## 2022-12-14 DIAGNOSIS — M6281 Muscle weakness (generalized): Secondary | ICD-10-CM

## 2022-12-14 DIAGNOSIS — M25571 Pain in right ankle and joints of right foot: Secondary | ICD-10-CM

## 2022-12-14 NOTE — Therapy (Signed)
OUTPATIENT PHYSICAL THERAPY TREATMENT   Patient Name: Jessica Roberts MRN: [id number] DOB:April 13, 1946, 77 y.o., female Today's Date: 12/14/2022     END OF SESSION:   PT End of Session - 12/14/22 0936     Visit Number 13    Number of Visits 20    Date for PT Re-Evaluation 01/18/23    Progress Note Due on Visit 20    PT Start Time 0930    PT Stop Time 1012    PT Time Calculation (min) 42 min    Activity Tolerance Patient tolerated treatment well    Behavior During Therapy [facility] for tasks assessed/performed                   Past Medical History:  Diagnosis Date   Allergy    Anxiety    Anxiety and depression    Breast cancer (HCC) 04/07/15   right breast   Breast cancer of upper-outer quadrant of right female breast (HCC) 04/11/2015   Broken nose    Complication of anesthesia    "had a very hard time waking up"   Dengue fever    Depression    Fall    broken nose   GERD (gastroesophageal reflux disease)    Head injury 1995   concussion in [place], med evac to [place], ear damage with vertigo   Head injury, acute, with loss of consciousness (HCC) 2000   [place] of the country   Head injury, closed, with concussion 1986   [place]   Headache    Hypertension    Malaria    PONV (postoperative nausea and vomiting)    Typhoid    Past Surgical History:  Procedure Laterality Date   BREAST LUMPECTOMY Right 04/27/2015   BREAST LUMPECTOMY WITH NEEDLE LOCALIZATION AND AXILLARY SENTINEL LYMPH NODE BX Right 04/27/2015   Procedure: RIGHT BREAST LUMPECTOMY WITH  TWO NEEDLE LOCALIZATION AND TWO RIGHT AXILLARY SENTINEL LYMPH NODE BX;  Surgeon: Claud Kelp, MD;  Location: [facility] OR;  Service: General;  Laterality: Right;   COLONOSCOPY W/ BIOPSIES AND POLYPECTOMY     NERVE SURGERY     '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' during surgery for endometriosis   surgical procedure for endometriosis     TONSILLECTOMY     Patient Active Problem List   Diagnosis Date Noted    Early stage nonexudative age-related macular degeneration of both eyes 11/27/2021   Choroidal nevus, right 11/27/2021   Nuclear sclerotic cataract of both eyes 11/27/2021   Iron deficiency 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 (chronic) 07/19/2020   Breast cancer of upper-outer quadrant of right female breast (HCC) 04/11/2015     THERAPY DIAG:  Chronic pain of right knee  Chronic pain of left knee  Difficulty in walking, not elsewhere classified  Localized edema  Pain in right ankle and joints of right foot  Muscle weakness (generalized)   PCP: [name], MD   REFERRING PROVIDER:  Kathryne Hitch, MD   REFERRING DIAG:  Diagnosis  M25.561,M25.562 (ICD-10-CM) - Pain in both knees, unspecified chronicity    EVAL THERAPY DIAG:  Chronic pain of right knee  Chronic pain of left knee  Localized edema  Difficulty in walking, not elsewhere classified  Muscle weakness (generalized)  Pain in right ankle and joints of right foot  Rationale for Evaluation and Treatment: Rehabilitation  ONSET DATE: years ago   SUBJECTIVE STATEMENT: Pt reporting relays only a twinge of pain in her Rt knee, overall feels better. She was able  to walk a mile in the park, but did have some moderate to little bit of difficulty with this  PERTINENT HISTORY: Allergies, breast CA, broken nose, h/o fall, depression, anxiety, head injury, HTN, breast lumpectomy  PAIN:  NPRS scale/location: 1/10 pain in ankle Aggravating factors: up and down stairs, when transitioning from sit to stand Relieving factors: Tylenol, Voltaran gel  PRECAUTIONS: None  WEIGHT BEARING RESTRICTIONS: No  FALLS:  Has patient fallen in last 6 months? No  LIVING ENVIRONMENT: Lives with: lives with their family and lives alone Lives in: House/apartment Stairs: Yes: External: 4 steps; bilateral but cannot reach both Has following equipment at home: None  OCCUPATION: retired  PLOF: Independent  PATIENT  GOALS: Get up and down out of recliner with no pain or stiffness     Stop knee from "giving out" when going up steps   OBJECTIVE:   DIAGNOSTIC FINDINGS: Result Narrative from 09/05/22  2 views of the right knee show just slight medial joint space narrowing.   There is otherwise no acute findings.  PATIENT SURVEYS:  09/11/22: FOTO intake: 49   predicted:  62 12/14/22: FOTO improved to 55%  MUSCLE LENGTH: 09/11/22: Hamstrings: Right 40 deg; Left 45 deg   POSTURE: rounded shoulders and forward head, decreased lumbar lordosis  PALPATION: No pin point tenderness note at eval  LOWER EXTREMITY ROM:   ROM Right 09/11/22 Left 09/11/22 Rt 10/15/22 Rt 11/14/22 Left 11/14/22  Hip flexion 100 106     Hip extension       Hip abduction       Hip adduction       Hip internal rotation       Hip external rotation       Knee flexion 124 130  A: 142 A: 138  Knee extension 0 0  A: 0 A: 0  Ankle dorsiflexion 2 20 A: 6 P: 10 A:10 P: 12   Ankle plantarflexion 18 32 A: 30 P: 35 A: 32 P: 36   Ankle inversion 34 40 A: 42 P: 42 A: 40 P: 42   Ankle eversion 30 34 A: 30 P: 45 A: 32 P: 44    (Blank rows = not tested)  LOWER EXTREMITY MMT:  MMT Right Eval 09/11/22 Left Eval 09/11/22 Rt 10/23/22 Left / Rt 11/21/22   Hip flexion 4/5 5/5 4+ 5 / 5  Hip extension      Hip abduction 4/5 4+/5 4+ 4+ / 4+  Hip adduction 4/5 4+/5 4+ 4+ / 4+  Hip internal rotation      Hip external rotation      Knee flexion 5/5 5/5 5 5  / 5  Knee extension 5/5 5/5 5 5  / 5  Ankle dorsiflexion 4/5 5/5 4+ 5 / 4+  Ankle plantarflexion 4/5 5/5 4+ 5 / 4+  Ankle inversion 4/5 5/5 4+ 5 / 4+  Ankle eversion 4/5 5/5 4+    (Blank rows = not tested)    FUNCTIONAL TESTS:  11/21/22:   5 time sit to stand: 10 seconds  with no UE support  10/23/22: 5 time sit to stand: 12 seconds with UE support      5 time sit to stand: 10 seconds  with no UE support   09/11/22: 5 time sit to stand: 16 seconds no UE  support  GAIT: Distance walked: 30 feet  Assistive device utilized: None Level of assistance: Complete Independence Comments: mild decreased heel strike in Rt LE   TODAY'S TREATMENT  DATE: 12/14/22 Pt seen for aquatic therapy today.  Treatment took place in water 3.5-4.75 ft in depth at the [facility] pool. Temp of water was 91.  Pt entered/exited the pool via stairs with rail.  Pt requires the buoyancy and hydrostatic pressure of water for support, and to offload joints by unweighting joint load by at least 50 % in navel deep water and by at least 75-80% in chest to neck deep water.  Viscosity of the water is needed for resistance of strengthening. Water current perturbations provides challenge to standing balance requiring increased core activation. Water exercises: Lateral walking, forward walking, retro walking 3 round trips in pool horizontal (30 feet) Leg swings add/abd X 15 bilat with UE support cues for slow and controlled movement Leg swings flexion/extension X 15 bilat with UE support cues for slow and controlled movements March walking 3 round trips  Retro walking with hamstring curls 3 round trips 1/4 squat isometric hold with row/chest press holding kickboard X 15 reps 1/4 squat with shoulder extension/flexion holding kickboard  X20 reps Monster walks horizontal width of pool holding dumbells X 2 round trips Lateral walking with mini squat with bilateral shoulder abd/add with floating dumbells 3 round trips Standing 3 way stepping (front, side, back)  X10 bilat holding dumbells Heel and toe raises X 15 bilat Step ups onto first step in pool X 10 bilat Squats on first step of pool X 15 reps  KT tape to Rt knee one I strip vertical over patella and one I strip horizontal at joint line  TODAY'S TREATMENT                                                                          DATE: 12/07/22 Pt  seen for aquatic therapy today.  Treatment took place in water 3.5-4.75 ft in depth at the [facility] pool. Temp of water was 91.  Pt entered/exited the pool via stairs with rail.  Pt requires the buoyancy and hydrostatic pressure of water for support, and to offload joints by unweighting joint load by at least 50 % in navel deep water and by at least 75-80% in chest to neck deep water.  Viscosity of the water is needed for resistance of strengthening. Water current perturbations provides challenge to standing balance requiring increased core activation. Water exercises: Lateral walking, forward walking, retro walking 3 round trips in pool horizontal (30 feet) Leg swings add/abd X 15 bilat with UE support cues for slow and controlled movement Leg swings flexion/extension X 15 bilat with UE support cues for slow and controlled movements Standing marches with UE support X 15 bilat Standing hamstring curls with UE support X 15 bilat 1/4 squat isometric hold with row/chest press holding kickboard X 15 reps 1/4 squat with shoulder extension/flexion holding kickboard  X20 reps March walking horizontal width of pool holding dumbells X 3 round trips Monster walks horizontal width of pool holding dumbells X 2 round trips Lateral walking with mini squat with bilateral shoulder abd/add with floating dumbells 2 round trips Seated on bench performing bicycle kicks X 20, hip abd/add X 20, bilat hip/knee flexion/ext X 20 Step ups onto first step in pool X 10 bilat Squats on first step of  pool X 15 reps   11/23/22 Pt seen for aquatic therapy today.  Treatment took place in water 3.5-4.75 ft in depth at the [facility] pool. Temp of water was 91.  Pt entered/exited the pool via stairs with rail.  Pt requires the buoyancy and hydrostatic pressure of water for support, and to offload joints by unweighting joint load by at least 50 % in navel deep water and by at least 75-80% in chest to neck  deep water.  Viscosity of the water is needed for resistance of strengthening. Water current perturbations provides challenge to standing balance requiring increased core activation. Water exercises: Lateral walking, forward walking, retro walking 3 round trips in pool horizontal (30 feet) Leg swings add/abd X 15 bilat with UE support cues for slow and controlled movement Leg swings flexion/extension X 15 bilat with UE support cues for slow and controlled movements Standing marches with UE support X 15 bilat Standing hamstring curls with UE support X 15 bilat 1/4 squat isometric hold with row/chest press holding kickboard X 15 reps 1/4 squat with shoulder extension/flexion holding kickboard  X20 reps Lateral walking with mini squat with bilateral shoulder abd/add with floating dumbells 2 round trips Hip extensions X 10 with yellow noodle under knee and back against pool wall, X 15 bilat Step ups onto first step in pool X 15 bilat Squats on first step of pool X 10 reps KT tape bilateral knees for knee/patella stability  11/21/22 TherEx:  Nustep: x 8 minutes Level 5 Calf stretch on slant board: 30 sec x 2 Standing hip abduction: x 15 c UE support Standing hip extension x 15 c UE support NMR:  Standing feet together on Airex: x 60 sec c finger tap support Standing on Airex stagger stance x 60 sec c finger tap support leading with each LE SLS level surface: 2 x 15 sec each LE c intermittent UE support Manual:  K-tape: in "C" pattern with strip across patella for support        PATIENT EDUCATION:  Education details: HEP, POC Person educated: Patient Education method: Programmer, multimedia, Demonstration, Verbal cues, and Handouts Education comprehension: verbalized understanding, returned demonstration, and verbal cues required  HOME EXERCISE PROGRAM: Access Code: [email or web address] URL: [email or web address] Date: 11/14/2022 Prepared by: Narda Amber  Exercises - Supine Bridge   - 2 x daily - 2 sets - 10 reps - 5 seconds hold - Hooklying Hamstring Stretch with Strap  - 2 x daily - 3 reps - 30 seconds hold - Seated Hamstring Stretch  - 2 x daily - 3 reps - 30 seconds hold - Seated Ankle Alphabet  - 2 x daily - 1 reps - Heel Raises with Counter Support  - 2 x daily - 2 sets - 10 reps - Ankle Dorsiflexion with Resistance  - 1-2 x daily - 7 x weekly - 2 sets - 10 reps - Ankle Inversion with Resistance  - 1-2 x daily - 7 x weekly - 2 sets - 10 reps - Ankle Eversion with Resistance  - 1-2 x daily - 7 x weekly - 2 sets - 10 reps - Ankle and Toe Plantarflexion with Resistance  - 1-2 x daily - 7 x weekly - 2 sets - 10 reps - Side Stepping with Resistance at Thighs  - 1 x daily - 7 x weekly - 1 reps  ASSESSMENT:  CLINICAL IMPRESSION: FOTO update shows she has made some functional progress but still has not yet met her goal for this and PT  recommending to continue with current plan. She did have good tolerance to aquatic PT session today and I KT taped her Rt knee at her request but this time taped over the patella vs around it as this as her pain location today.   OBJECTIVE IMPAIRMENTS: decreased activity tolerance, difficulty walking, decreased ROM, decreased strength, and pain.   ACTIVITY LIMITATIONS: standing, squatting, stairs, and transfers  PARTICIPATION LIMITATIONS: cleaning and community activity  PERSONAL FACTORS: 3+ comorbidities: see above history are also affecting patient's functional outcome.   REHAB POTENTIAL: Good  CLINICAL DECISION MAKING: Stable/uncomplicated  EVALUATION COMPLEXITY: Low   GOALS: Goals reviewed with patient? Yes  SHORT TERM GOALS: (target date for Short term goals are 3 weeks 10/06/22)   1.  Patient will demonstrate independent use of home exercise program to maintain progress from in clinic treatments.  Goal status: MET 10/15/22  LONG TERM GOALS: (target dates for all long term goals are 10 weeks  11/23/22 )   1. Patient will  demonstrate/report pain at worst less than or equal to 2/10 to facilitate minimal limitation in daily activity secondary to pain symptoms.  Goal status: On-going 12/14/22   2. Patient will demonstrate independent use of home exercise program to facilitate ability to maintain/progress functional gains from skilled physical therapy services.  Goal status: MET 12/14/22   3. Patient will demonstrate FOTO outcome > or = 62 % to indicate reduced disability due to condition.  Goal status: On-going 12/14/22   4.  Pt will be able to amb up and down 1 flight of stairs with single hand rail with step over step gait pattern with no pain reported.   Goal status: On-going 11/14/22   5.  Patient will be able to improve her 5 time sit to stand to </= 12 seconds with no UE support for improved functional mobility   Goal status: MET 10/23/22   6.  Patient will be able to report able 20 minutes with no pain in bil knees or Rt ankle.   Goal status: On-going 12/14/22    PLAN:  PT FREQUENCY: 1-2x/week  PT DURATION: 10 weeks  PLANNED INTERVENTIONS: Therapeutic exercises, Therapeutic activity, Neuro Muscular re-education, Balance training, Gait training, Patient/Family education, Joint mobilization, Stair training, DME instructions, Dry Needling, Electrical stimulation, Traction, Cryotherapy, vasopneumatic deviceMoist heat, Taping, Ultrasound, Ionotophoresis 4mg /ml Dexamethasone, and Manual therapy.  All included unless contraindicated  PLAN FOR NEXT SESSION:   Taping and or aquatic PT as needed general LE strengthening, and functional mobility training, ankle stability   Ivery Quale, PT, DPT 12/14/22 9:37 AM

## 2022-12-18 DIAGNOSIS — E119 Type 2 diabetes mellitus without complications: Secondary | ICD-10-CM | POA: Diagnosis not present

## 2022-12-19 ENCOUNTER — Ambulatory Visit: Payer: Medicare Other | Admitting: Physical Therapy

## 2022-12-19 ENCOUNTER — Encounter: Payer: Self-pay | Admitting: Physical Therapy

## 2022-12-19 ENCOUNTER — Inpatient Hospital Stay: Payer: Medicare Other | Attending: Hematology

## 2022-12-19 DIAGNOSIS — M25562 Pain in left knee: Secondary | ICD-10-CM

## 2022-12-19 DIAGNOSIS — M25571 Pain in right ankle and joints of right foot: Secondary | ICD-10-CM

## 2022-12-19 DIAGNOSIS — D509 Iron deficiency anemia, unspecified: Secondary | ICD-10-CM | POA: Insufficient documentation

## 2022-12-19 DIAGNOSIS — G8929 Other chronic pain: Secondary | ICD-10-CM

## 2022-12-19 DIAGNOSIS — R262 Difficulty in walking, not elsewhere classified: Secondary | ICD-10-CM | POA: Diagnosis not present

## 2022-12-19 DIAGNOSIS — M6281 Muscle weakness (generalized): Secondary | ICD-10-CM

## 2022-12-19 DIAGNOSIS — C50411 Malignant neoplasm of upper-outer quadrant of right female breast: Secondary | ICD-10-CM

## 2022-12-19 DIAGNOSIS — R600 Localized edema: Secondary | ICD-10-CM

## 2022-12-19 DIAGNOSIS — D5 Iron deficiency 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 (chronic): Secondary | ICD-10-CM

## 2022-12-19 DIAGNOSIS — M25561 Pain in right knee: Secondary | ICD-10-CM | POA: Diagnosis not present

## 2022-12-19 DIAGNOSIS — Z170 Estrogen receptor positive status [ER+]: Secondary | ICD-10-CM

## 2022-12-19 LAB — CBC WITH DIFFERENTIAL (CANCER CENTER ONLY)
Abs Immature Granulocytes: 0.02 10*3/uL (ref 0.00–0.07)
Basophils Absolute: 0.1 10*3/uL (ref 0.0–0.1)
Basophils Relative: 1 %
Eosinophils Absolute: 0.3 10*3/uL (ref 0.0–0.5)
Eosinophils Relative: 3 %
HCT: 42.3 % (ref 36.0–46.0)
Hemoglobin: 13.7 g/dL (ref 12.0–15.0)
Immature Granulocytes: 0 %
Lymphocytes Relative: 35 %
Lymphs Abs: 3 10*3/uL (ref 0.7–4.0)
MCH: 30.2 pg (ref 26.0–34.0)
MCHC: 32.4 g/dL (ref 30.0–36.0)
MCV: 93.2 fL (ref 80.0–100.0)
Monocytes Absolute: 0.6 10*3/uL (ref 0.1–1.0)
Monocytes Relative: 7 %
Neutro Abs: 4.5 10*3/uL (ref 1.7–7.7)
Neutrophils Relative %: 54 %
Platelet Count: 295 10*3/uL (ref 150–400)
RBC: 4.54 MIL/uL (ref 3.87–5.11)
RDW: 13.8 % (ref 11.5–15.5)
WBC Count: 8.5 10*3/uL (ref 4.0–10.5)
nRBC: 0 % (ref 0.0–0.2)

## 2022-12-19 LAB — IRON AND IRON BINDING CAPACITY (CC-WL,HP ONLY)
Iron: 66 ug/dL (ref 28–170)
Saturation Ratios: 19 % (ref 10.4–31.8)
TIBC: 346 ug/dL (ref 250–450)
UIBC: 280 ug/dL (ref 148–442)

## 2022-12-19 LAB — FERRITIN: Ferritin: 73 ng/mL (ref 11–307)

## 2022-12-19 NOTE — Therapy (Signed)
OUTPATIENT PHYSICAL THERAPY TREATMENT   Patient Name: Jessica Roberts MRN: [id number] DOB:1946-02-09, 77 y.o., female Today's Date: 12/19/2022     END OF SESSION:   PT End of Session - 12/19/22 0935     Visit Number 14    Number of Visits 20    Date for PT Re-Evaluation 01/18/23    Progress Note Due on Visit 20    PT Start Time 0850    PT Stop Time 0930    PT Time Calculation (min) 40 min    Activity Tolerance Patient tolerated treatment well    Behavior During Therapy [facility] for tasks assessed/performed                   Past Medical History:  Diagnosis Date   Allergy    Anxiety    Anxiety and depression    Breast cancer (HCC) 04/07/15   right breast   Breast cancer of upper-outer quadrant of right female breast (HCC) 04/11/2015   Broken nose    Complication of anesthesia    "had a very hard time waking up"   Dengue fever    Depression    Fall    broken nose   GERD (gastroesophageal reflux disease)    Head injury 1995   concussion in [place], med evac to [place], ear damage with vertigo   Head injury, acute, with loss of consciousness (HCC) 2000   [place] the country   Head injury, closed, with concussion 1986   [place]   Headache    Hypertension    Malaria    PONV (postoperative nausea and vomiting)    Typhoid    Past Surgical History:  Procedure Laterality Date   BREAST LUMPECTOMY Right 04/27/2015   BREAST LUMPECTOMY WITH NEEDLE LOCALIZATION AND AXILLARY SENTINEL LYMPH NODE BX Right 04/27/2015   Procedure: RIGHT BREAST LUMPECTOMY WITH  TWO NEEDLE LOCALIZATION AND TWO RIGHT AXILLARY SENTINEL LYMPH NODE BX;  Surgeon: Claud Kelp, MD;  Location: [facility] OR;  Service: General;  Laterality: Right;   COLONOSCOPY W/ BIOPSIES AND POLYPECTOMY     NERVE SURGERY     '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' during surgery for endometriosis   surgical procedure for endometriosis     TONSILLECTOMY     Patient Active Problem List   Diagnosis Date Noted    Early stage nonexudative age-related macular degeneration of both eyes 11/27/2021   Choroidal nevus, right 11/27/2021   Nuclear sclerotic cataract of both eyes 11/27/2021   Iron deficiency 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 (chronic) 07/19/2020   Breast cancer of upper-outer quadrant of right female breast (HCC) 04/11/2015     THERAPY DIAG:  Chronic pain of right knee  Difficulty in walking, not elsewhere classified  Pain in right ankle and joints of right foot  Chronic pain of left knee  Localized edema  Muscle weakness (generalized)   PCP: [name], MD   REFERRING PROVIDER:  Kathryne Hitch, MD   REFERRING DIAG:  Diagnosis  M25.561,M25.562 (ICD-10-CM) - Pain in both knees, unspecified chronicity    EVAL THERAPY DIAG:  Chronic pain of right knee  Chronic pain of left knee  Localized edema  Difficulty in walking, not elsewhere classified  Muscle weakness (generalized)  Pain in right ankle and joints of right foot  Rationale for Evaluation and Treatment: Rehabilitation  ONSET DATE: years ago   SUBJECTIVE STATEMENT: Pt reporting no pain at rest. Pt reporting pain first thing in the morning when getting out of  bed and with end ranges of df.   PERTINENT HISTORY: Allergies, breast CA, broken nose, h/o fall, depression, anxiety, head injury, HTN, breast lumpectomy  PAIN:  NPRS scale/location: 6/10 pain in ankle with end ranges of flexion Aggravating factors: up and down stairs, when transitioning from sit to stand Relieving factors: Tylenol, Voltaran gel  PRECAUTIONS: None  WEIGHT BEARING RESTRICTIONS: No  FALLS:  Has patient fallen in last 6 months? No  LIVING ENVIRONMENT: Lives with: lives with their family and lives alone Lives in: House/apartment Stairs: Yes: External: 4 steps; bilateral but cannot reach both Has following equipment at home: None  OCCUPATION: retired  PLOF: Independent  PATIENT GOALS: Get up and down out of  recliner with no pain or stiffness     Stop knee from "giving out" when going up steps   OBJECTIVE:   DIAGNOSTIC FINDINGS: Result Narrative from 09/05/22  2 views of the right knee show just slight medial joint space narrowing.   There is otherwise no acute findings.  PATIENT SURVEYS:  09/11/22: FOTO intake: 49   predicted:  62 12/14/22: FOTO improved to 55%  MUSCLE LENGTH: 09/11/22: Hamstrings: Right 40 deg; Left 45 deg   POSTURE: rounded shoulders and forward head, decreased lumbar lordosis  PALPATION: No pin point tenderness note at eval  LOWER EXTREMITY ROM:   ROM Right 09/11/22 Left 09/11/22 Rt 10/15/22 Rt 11/14/22 Left 11/14/22 Rt 12/19/22  Hip flexion 100 106      Hip extension        Hip abduction        Hip adduction        Hip internal rotation        Hip external rotation        Knee flexion 124 130  A: 142 A: 138   Knee extension 0 0  A: 0 A: 0   Ankle dorsiflexion 2 20 A: 6 P: 10 A:10 P: 12  A:12 P: 20  Ankle plantarflexion 18 32 A: 30 P: 35 A: 32 P: 36  A: 34 P: 36  Ankle inversion 34 40 A: 42 P: 42 A: 40 P: 42  A: 40 P; 44  Ankle eversion 30 34 A: 30 P: 45 A: 32 P: 44  A: 32 P: 45   (Blank rows = not tested)  LOWER EXTREMITY MMT:  MMT Right Eval 09/11/22 Left Eval 09/11/22 Rt 10/23/22 Left / Rt 11/21/22  Left / RT 12/19/22  Hip flexion 4/5 5/5 4+ 5 / 5 5 / 5  Hip extension       Hip abduction 4/5 4+/5 4+ 4+ / 4+ 5 / 5  Hip adduction 4/5 4+/5 4+ 4+ / 4+ 5 / 5  Hip internal rotation       Hip external rotation       Knee flexion 5/5 5/5 5 5  / 5 5 / 5  Knee extension 5/5 5/5 5 5  / 5 5 / 5  Ankle dorsiflexion 4/5 5/5 4+ 5 / 4+ 5 / 5  Ankle plantarflexion 4/5 5/5 4+ 5 / 4+ 5 / 5  Ankle inversion 4/5 5/5 4+ 5 / 4+ 5 / 5  Ankle eversion 4/5 5/5 4+  5 / 4+   (Blank rows = not tested)    FUNCTIONAL TESTS:  11/21/22:   5 time sit to stand: 10 seconds  with no UE support  10/23/22: 5 time sit to stand: 12 seconds with UE support      5 time sit  to stand: 10 seconds  with no UE support   09/11/22: 5 time sit to stand: 16 seconds no UE support  GAIT: Distance walked: 30 feet  Assistive device utilized: None Level of assistance: Complete Independence Comments: mild decreased heel strike in Rt LE   TODAY'S TREATMENT                                                                          DATE: 12/19/22 TherEx:  Calf stretch off edge of step x 4 holding 30 sec  ROM and MMT performed and updated  NMR:  Blaze Pods:  standing on level ground: 4 pod tap c UE's x 4  Staning on Airex: 4 pod tap c UE's x 4 Standing on Airex: 4 pod tap reaching across with her UE's x 4 Standing staggered stance: 4 pod tap reaching across with her UE's x 4: Rt arm tapping red light and Left arm tapping purple Manual:  K-tape: in "T" pattern with strip across patella both vertical and horizontal for support        12/14/22 Pt seen for aquatic therapy today.  Treatment took place in water 3.5-4.75 ft in depth at the [facility] pool. Temp of water was 91.  Pt entered/exited the pool via stairs with rail.  Pt requires the buoyancy and hydrostatic pressure of water for support, and to offload joints by unweighting joint load by at least 50 % in navel deep water and by at least 75-80% in chest to neck deep water.  Viscosity of the water is needed for resistance of strengthening. Water current perturbations provides challenge to standing balance requiring increased core activation. Water exercises: Lateral walking, forward walking, retro walking 3 round trips in pool horizontal (30 feet) Leg swings add/abd X 15 bilat with UE support cues for slow and controlled movement Leg swings flexion/extension X 15 bilat with UE support cues for slow and controlled movements March walking 3 round trips  Retro walking with hamstring curls 3 round trips 1/4 squat isometric hold with row/chest press holding kickboard X 15 reps 1/4 squat with shoulder  extension/flexion holding kickboard  X20 reps Monster walks horizontal width of pool holding dumbells X 2 round trips Lateral walking with mini squat with bilateral shoulder abd/add with floating dumbells 3 round trips Standing 3 way stepping (front, side, back)  X10 bilat holding dumbells Heel and toe raises X 15 bilat Step ups onto first step in pool X 10 bilat Squats on first step of pool X 15 reps  KT tape to Rt knee one I strip vertical over patella and one I strip horizontal at joint line  TODAY'S TREATMENT                                                                          DATE: 12/07/22 Pt seen for aquatic therapy today.  Treatment took place in water 3.5-4.75 ft in depth at the [facility] pool. Temp of  water was 91.  Pt entered/exited the pool via stairs with rail.  Pt requires the buoyancy and hydrostatic pressure of water for support, and to offload joints by unweighting joint load by at least 50 % in navel deep water and by at least 75-80% in chest to neck deep water.  Viscosity of the water is needed for resistance of strengthening. Water current perturbations provides challenge to standing balance requiring increased core activation. Water exercises: Lateral walking, forward walking, retro walking 3 round trips in pool horizontal (30 feet) Leg swings add/abd X 15 bilat with UE support cues for slow and controlled movement Leg swings flexion/extension X 15 bilat with UE support cues for slow and controlled movements Standing marches with UE support X 15 bilat Standing hamstring curls with UE support X 15 bilat 1/4 squat isometric hold with row/chest press holding kickboard X 15 reps 1/4 squat with shoulder extension/flexion holding kickboard  X20 reps March walking horizontal width of pool holding dumbells X 3 round trips Monster walks horizontal width of pool holding dumbells X 2 round trips Lateral walking with mini squat with bilateral shoulder abd/add with  floating dumbells 2 round trips Seated on bench performing bicycle kicks X 20, hip abd/add X 20, bilat hip/knee flexion/ext X 20 Step ups onto first step in pool X 10 bilat Squats on first step of pool X 15 reps   11/23/22 Pt seen for aquatic therapy today.  Treatment took place in water 3.5-4.75 ft in depth at the [facility] pool. Temp of water was 91.  Pt entered/exited the pool via stairs with rail.  Pt requires the buoyancy and hydrostatic pressure of water for support, and to offload joints by unweighting joint load by at least 50 % in navel deep water and by at least 75-80% in chest to neck deep water.  Viscosity of the water is needed for resistance of strengthening. Water current perturbations provides challenge to standing balance requiring increased core activation. Water exercises: Lateral walking, forward walking, retro walking 3 round trips in pool horizontal (30 feet) Leg swings add/abd X 15 bilat with UE support cues for slow and controlled movement Leg swings flexion/extension X 15 bilat with UE support cues for slow and controlled movements Standing marches with UE support X 15 bilat Standing hamstring curls with UE support X 15 bilat 1/4 squat isometric hold with row/chest press holding kickboard X 15 reps 1/4 squat with shoulder extension/flexion holding kickboard  X20 reps Lateral walking with mini squat with bilateral shoulder abd/add with floating dumbells 2 round trips Hip extensions X 10 with yellow noodle under knee and back against pool wall, X 15 bilat Step ups onto first step in pool X 15 bilat Squats on first step of pool X 10 reps KT tape bilateral knees for knee/patella stability        PATIENT EDUCATION:  Education details: HEP, POC Person educated: Patient Education method: Programmer, multimedia, Demonstration, Verbal cues, and Handouts Education comprehension: verbalized understanding, returned demonstration, and verbal cues required  HOME  EXERCISE PROGRAM: Access Code: [email or web address] URL: [email or web address] Date: 11/14/2022 Prepared by: Narda Amber  Exercises - Supine Bridge  - 2 x daily - 2 sets - 10 reps - 5 seconds hold - Hooklying Hamstring Stretch with Strap  - 2 x daily - 3 reps - 30 seconds hold - Seated Hamstring Stretch  - 2 x daily - 3 reps - 30 seconds hold - Seated Ankle Alphabet  - 2 x daily - 1 reps - Heel Raises  with Counter Support  - 2 x daily - 2 sets - 10 reps - Ankle Dorsiflexion with Resistance  - 1-2 x daily - 7 x weekly - 2 sets - 10 reps - Ankle Inversion with Resistance  - 1-2 x daily - 7 x weekly - 2 sets - 10 reps - Ankle Eversion with Resistance  - 1-2 x daily - 7 x weekly - 2 sets - 10 reps - Ankle and Toe Plantarflexion with Resistance  - 1-2 x daily - 7 x weekly - 2 sets - 10 reps - Side Stepping with Resistance at Thighs  - 1 x daily - 7 x weekly - 1 reps  ASSESSMENT:  CLINICAL IMPRESSION: Pt was edu on tapping technique today after stating she was going to purchase tape for home use. Pt's MMT and ROM was updated with improvements noted. Pt still with mild deficit in DF ROM and Eversion strength. Pt reporting overall improvements in her daily function. Continued skilled PT to continue to work on decreasing pt's pain in her Rt ankle.   OBJECTIVE IMPAIRMENTS: decreased activity tolerance, difficulty walking, decreased ROM, decreased strength, and pain.   ACTIVITY LIMITATIONS: standing, squatting, stairs, and transfers  PARTICIPATION LIMITATIONS: cleaning and community activity  PERSONAL FACTORS: 3+ comorbidities: see above history are also affecting patient's functional outcome.   REHAB POTENTIAL: Good  CLINICAL DECISION MAKING: Stable/uncomplicated  EVALUATION COMPLEXITY: Low   GOALS: Goals reviewed with patient? Yes  SHORT TERM GOALS: (target date for Short term goals are 3 weeks 10/06/22)   1.  Patient will demonstrate independent use of home exercise program to  maintain progress from in clinic treatments.  Goal status: MET 10/15/22  LONG TERM GOALS: (target dates for all long term goals are 10 weeks  11/23/22 )   1. Patient will demonstrate/report pain at worst less than or equal to 2/10 to facilitate minimal limitation in daily activity secondary to pain symptoms.  Goal status: On-going 12/14/22   2. Patient will demonstrate independent use of home exercise program to facilitate ability to maintain/progress functional gains from skilled physical therapy services.  Goal status: MET 12/14/22   3. Patient will demonstrate FOTO outcome > or = 62 % to indicate reduced disability due to condition.  Goal status: On-going 12/14/22   4.  Pt will be able to amb up and down 1 flight of stairs with single hand rail with step over step gait pattern with no pain reported.   Goal status: On-going 11/14/22   5.  Patient will be able to improve her 5 time sit to stand to </= 12 seconds with no UE support for improved functional mobility   Goal status: MET 10/23/22   6.  Patient will be able to report able 20 minutes with no pain in bil knees or Rt ankle.   Goal status: On-going 12/14/22    PLAN:  PT FREQUENCY: 1-2x/week  PT DURATION: 10 weeks  PLANNED INTERVENTIONS: Therapeutic exercises, Therapeutic activity, Neuro Muscular re-education, Balance training, Gait training, Patient/Family education, Joint mobilization, Stair training, DME instructions, Dry Needling, Electrical stimulation, Traction, Cryotherapy, vasopneumatic deviceMoist heat, Taping, Ultrasound, Ionotophoresis 4mg /ml Dexamethasone, and Manual therapy.  All included unless contraindicated  PLAN FOR NEXT SESSION:   Taping and or aquatic PT as needed general LE strengthening, and functional mobility training, ankle stability   Narda Amber, PT, MPT 12/19/22 9:45 AM   12/19/22 9:45 AM

## 2022-12-21 ENCOUNTER — Ambulatory Visit (INDEPENDENT_AMBULATORY_CARE_PROVIDER_SITE_OTHER): Payer: Medicare Other | Admitting: Physical Therapy

## 2022-12-21 ENCOUNTER — Encounter: Payer: Self-pay | Admitting: Physical Therapy

## 2022-12-21 DIAGNOSIS — M25571 Pain in right ankle and joints of right foot: Secondary | ICD-10-CM | POA: Diagnosis not present

## 2022-12-21 DIAGNOSIS — R262 Difficulty in walking, not elsewhere classified: Secondary | ICD-10-CM | POA: Diagnosis not present

## 2022-12-21 DIAGNOSIS — M25561 Pain in right knee: Secondary | ICD-10-CM

## 2022-12-21 DIAGNOSIS — M25562 Pain in left knee: Secondary | ICD-10-CM | POA: Diagnosis not present

## 2022-12-21 DIAGNOSIS — R600 Localized edema: Secondary | ICD-10-CM

## 2022-12-21 DIAGNOSIS — M6281 Muscle weakness (generalized): Secondary | ICD-10-CM

## 2022-12-21 DIAGNOSIS — G8929 Other chronic pain: Secondary | ICD-10-CM

## 2022-12-21 NOTE — Therapy (Signed)
OUTPATIENT PHYSICAL THERAPY TREATMENT   Patient Name: Jessica Roberts MRN: [id number] DOB:1946-08-11, 77 y.o., female Today's Date: 12/21/2022     END OF SESSION:   PT End of Session - 12/21/22 0858     Visit Number 15    Number of Visits 20    Date for PT Re-Evaluation 01/18/23    Progress Note Due on Visit 20    PT Start Time 0850    PT Stop Time 0930    PT Time Calculation (min) 40 min    Activity Tolerance Patient tolerated treatment well    Behavior During Therapy [facility] for tasks assessed/performed                   Past Medical History:  Diagnosis Date   Allergy    Anxiety    Anxiety and depression    Breast cancer (HCC) 04/07/15   right breast   Breast cancer of upper-outer quadrant of right female breast (HCC) 04/11/2015   Broken nose    Complication of anesthesia    "had a very hard time waking up"   Dengue fever    Depression    Fall    broken nose   GERD (gastroesophageal reflux disease)    Head injury 1995   concussion in [place], med evac to [place], ear damage with vertigo   Head injury, acute, with loss of consciousness (HCC) 2000   [place] the [place]   Head injury, closed, with concussion 1986   [place]   Headache    Hypertension    Malaria    PONV (postoperative nausea and vomiting)    Typhoid    Past Surgical History:  Procedure Laterality Date   BREAST LUMPECTOMY Right 04/27/2015   BREAST LUMPECTOMY WITH NEEDLE LOCALIZATION AND AXILLARY SENTINEL LYMPH NODE BX Right 04/27/2015   Procedure: RIGHT BREAST LUMPECTOMY WITH  TWO NEEDLE LOCALIZATION AND TWO RIGHT AXILLARY SENTINEL LYMPH NODE BX;  Surgeon: Claud Kelp, MD;  Location: [facility] OR;  Service: General;  Laterality: Right;   COLONOSCOPY W/ BIOPSIES AND POLYPECTOMY     NERVE SURGERY     '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' during surgery for endometriosis   surgical procedure for endometriosis     TONSILLECTOMY     Patient Active Problem List   Diagnosis Date Noted    Early stage nonexudative age-related macular degeneration of both eyes 11/27/2021   Choroidal nevus, right 11/27/2021   Nuclear sclerotic cataract of both eyes 11/27/2021   Iron deficiency 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 (chronic) 07/19/2020   Breast cancer of upper-outer quadrant of right female breast (HCC) 04/11/2015     THERAPY DIAG:  Chronic pain of right knee  Difficulty in walking, not elsewhere classified  Pain in right ankle and joints of right foot  Chronic pain of left knee  Localized edema  Muscle weakness (generalized)   PCP: [name], MD   REFERRING PROVIDER:  Kathryne Hitch, MD   REFERRING DIAG:  Diagnosis  M25.561,M25.562 (ICD-10-CM) - Pain in both knees, unspecified chronicity    EVAL THERAPY DIAG:  Chronic pain of right knee  Chronic pain of left knee  Localized edema  Difficulty in walking, not elsewhere classified  Muscle weakness (generalized)  Pain in right ankle and joints of right foot  Rationale for Evaluation and Treatment: Rehabilitation  ONSET DATE: years ago   SUBJECTIVE STATEMENT: Pt relays her body feels good today  PERTINENT HISTORY: Allergies, breast CA, broken nose, h/o fall, depression,  anxiety, head injury, HTN, breast lumpectomy  PAIN:  NPRS scale/location: 3/10 pain in ankle with end ranges of flexion Aggravating factors: up and down stairs, when transitioning from sit to stand Relieving factors: Tylenol, Voltaran gel  PRECAUTIONS: None  WEIGHT BEARING RESTRICTIONS: No  FALLS:  Has patient fallen in last 6 months? No  LIVING ENVIRONMENT: Lives with: lives with their family and lives alone Lives in: House/apartment Stairs: Yes: External: 4 steps; bilateral but cannot reach both Has following equipment at home: None  OCCUPATION: retired  PLOF: Independent  PATIENT GOALS: Get up and down out of recliner with no pain or stiffness     Stop knee from "giving out" when going up  steps   OBJECTIVE:   DIAGNOSTIC FINDINGS: Result Narrative from 09/05/22  2 views of the right knee show just slight medial joint space narrowing.   There is otherwise no acute findings.  PATIENT SURVEYS:  09/11/22: FOTO intake: 49   predicted:  62 12/14/22: FOTO improved to 55%  MUSCLE LENGTH: 09/11/22: Hamstrings: Right 40 deg; Left 45 deg   POSTURE: rounded shoulders and forward head, decreased lumbar lordosis  PALPATION: No pin point tenderness note at eval  LOWER EXTREMITY ROM:   ROM Right 09/11/22 Left 09/11/22 Rt 10/15/22 Rt 11/14/22 Left 11/14/22 Rt 12/19/22  Hip flexion 100 106      Hip extension        Hip abduction        Hip adduction        Hip internal rotation        Hip external rotation        Knee flexion 124 130  A: 142 A: 138   Knee extension 0 0  A: 0 A: 0   Ankle dorsiflexion 2 20 A: 6 P: 10 A:10 P: 12  A:12 P: 20  Ankle plantarflexion 18 32 A: 30 P: 35 A: 32 P: 36  A: 34 P: 36  Ankle inversion 34 40 A: 42 P: 42 A: 40 P: 42  A: 40 P; 44  Ankle eversion 30 34 A: 30 P: 45 A: 32 P: 44  A: 32 P: 45   (Blank rows = not tested)  LOWER EXTREMITY MMT:  MMT Right Eval 09/11/22 Left Eval 09/11/22 Rt 10/23/22 Left / Rt 11/21/22  Left / RT 12/19/22  Hip flexion 4/5 5/5 4+ 5 / 5 5 / 5  Hip extension       Hip abduction 4/5 4+/5 4+ 4+ / 4+ 5 / 5  Hip adduction 4/5 4+/5 4+ 4+ / 4+ 5 / 5  Hip internal rotation       Hip external rotation       Knee flexion 5/5 5/5 5 5  / 5 5 / 5  Knee extension 5/5 5/5 5 5  / 5 5 / 5  Ankle dorsiflexion 4/5 5/5 4+ 5 / 4+ 5 / 5  Ankle plantarflexion 4/5 5/5 4+ 5 / 4+ 5 / 5  Ankle inversion 4/5 5/5 4+ 5 / 4+ 5 / 5  Ankle eversion 4/5 5/5 4+  5 / 4+   (Blank rows = not tested)    FUNCTIONAL TESTS:  11/21/22:   5 time sit to stand: 10 seconds  with no UE support  10/23/22: 5 time sit to stand: 12 seconds with UE support      5 time sit to stand: 10 seconds  with no UE support   09/11/22: 5 time sit to stand: 16  seconds no UE support  GAIT: Distance walked: 30 feet  Assistive device utilized: None Level of assistance: Complete Independence Comments: mild decreased heel strike in Rt LE   TODAY'S TREATMENT                                                                          DATE:  12/21/22 Pt seen for aquatic therapy today.  Treatment took place in water 3.5-4.75 ft in depth at the [facility] pool. Temp of water was 91.  Pt entered/exited the pool via stairs with rail.  Pt requires the buoyancy and hydrostatic pressure of water for support, and to offload joints by unweighting joint load by at least 50 % in navel deep water and by at least 75-80% in chest to neck deep water.  Viscosity of the water is needed for resistance of strengthening. Water current perturbations provides challenge to standing balance requiring increased core activation. Water exercises: Lateral walking, forward walking, retro walking 3 round trips in pool horizontal (30 feet) Leg swings add/abd X 15 bilat with UE support cues for slow and controlled movement Leg swings flexion/extension X 15 bilat with UE support cues for slow and controlled movements March walking 3 round trips  Retro walking with hamstring curls 3 round trips 1/4 squat isometric hold with row/chest press holding kickboard X 15 reps 1/4 squat with shoulder extension/flexion holding kickboard  X15  reps Monster walks horizontal width of pool holding dumbells X 2 round trips Lateral walking with mini squat with bilateral shoulder abd/add with floating dumbells 3 round trips Standing 3 way stepping (front, side, back)  X10 bilat holding dumbells Heel raises off first step in pool X 15 Step ups onto first step in pool X 10 bilat Squats on first step of pool X 15 reps  12/19/22 TherEx:  Calf stretch off edge of step x 4 holding 30 sec  ROM and MMT performed and updated  NMR:  Blaze Pods:  standing on level ground: 4 pod tap c UE's x 4   Staning on Airex: 4 pod tap c UE's x 4 Standing on Airex: 4 pod tap reaching across with her UE's x 4 Standing staggered stance: 4 pod tap reaching across with her UE's x 4: Rt arm tapping red light and Left arm tapping purple Manual:  K-tape: in "T" pattern with strip across patella both vertical and horizontal for support   PATIENT EDUCATION:  Education details: HEP, POC Person educated: Patient Education method: Programmer, multimedia, Demonstration, Verbal cues, and Handouts Education comprehension: verbalized understanding, returned demonstration, and verbal cues required  HOME EXERCISE PROGRAM: Access Code: [email or web address] URL: [email or web address] Date: 11/14/2022 Prepared by: Narda Amber  Exercises - Supine Bridge  - 2 x daily - 2 sets - 10 reps - 5 seconds hold - Hooklying Hamstring Stretch with Strap  - 2 x daily - 3 reps - 30 seconds hold - Seated Hamstring Stretch  - 2 x daily - 3 reps - 30 seconds hold - Seated Ankle Alphabet  - 2 x daily - 1 reps - Heel Raises with Counter Support  - 2 x daily - 2 sets - 10 reps - Ankle Dorsiflexion with Resistance  - 1-2 x daily - 7 x  weekly - 2 sets - 10 reps - Ankle Inversion with Resistance  - 1-2 x daily - 7 x weekly - 2 sets - 10 reps - Ankle Eversion with Resistance  - 1-2 x daily - 7 x weekly - 2 sets - 10 reps - Ankle and Toe Plantarflexion with Resistance  - 1-2 x daily - 7 x weekly - 2 sets - 10 reps - Side Stepping with Resistance at Thighs  - 1 x daily - 7 x weekly - 1 reps  ASSESSMENT:  CLINICAL IMPRESSION: She showed good overall tolerance with her aquatic PT exercises for leg ROM and strengthening. She will continue to benefit land and aquatic PT sessions.   OBJECTIVE IMPAIRMENTS: decreased activity tolerance, difficulty walking, decreased ROM, decreased strength, and pain.   ACTIVITY LIMITATIONS: standing, squatting, stairs, and transfers  PARTICIPATION LIMITATIONS: cleaning and community activity  PERSONAL  FACTORS: 3+ comorbidities: see above history are also affecting patient's functional outcome.   REHAB POTENTIAL: Good  CLINICAL DECISION MAKING: Stable/uncomplicated  EVALUATION COMPLEXITY: Low   GOALS: Goals reviewed with patient? Yes  SHORT TERM GOALS: (target date for Short term goals are 3 weeks 10/06/22)   1.  Patient will demonstrate independent use of home exercise program to maintain progress from in clinic treatments.  Goal status: MET 10/15/22  LONG TERM GOALS: (target dates for all long term goals are 10 weeks  11/23/22 )   1. Patient will demonstrate/report pain at worst less than or equal to 2/10 to facilitate minimal limitation in daily activity secondary to pain symptoms.  Goal status: On-going 12/14/22   2. Patient will demonstrate independent use of home exercise program to facilitate ability to maintain/progress functional gains from skilled physical therapy services.  Goal status: MET 12/14/22   3. Patient will demonstrate FOTO outcome > or = 62 % to indicate reduced disability due to condition.  Goal status: On-going 12/14/22   4.  Pt will be able to amb up and down 1 flight of stairs with single hand rail with step over step gait pattern with no pain reported.   Goal status: On-going 11/14/22   5.  Patient will be able to improve her 5 time sit to stand to </= 12 seconds with no UE support for improved functional mobility   Goal status: MET 10/23/22   6.  Patient will be able to report able 20 minutes with no pain in bil knees or Rt ankle.   Goal status: On-going 12/14/22    PLAN:  PT FREQUENCY: 1-2x/week  PT DURATION: 10 weeks  PLANNED INTERVENTIONS: Therapeutic exercises, Therapeutic activity, Neuro Muscular re-education, Balance training, Gait training, Patient/Family education, Joint mobilization, Stair training, DME instructions, Dry Needling, Electrical stimulation, Traction, Cryotherapy, vasopneumatic deviceMoist heat, Taping, Ultrasound, Ionotophoresis  4mg /ml Dexamethasone, and Manual therapy.  All included unless contraindicated  PLAN FOR NEXT SESSION:   Taping and or aquatic PT as needed general LE strengthening, and functional mobility training, ankle stability  Ivery Quale, PT, DPT 12/21/22 9:00 AM

## 2022-12-26 ENCOUNTER — Encounter: Payer: Medicare Other | Admitting: Physical Therapy

## 2022-12-28 ENCOUNTER — Encounter: Payer: Self-pay | Admitting: Physical Therapy

## 2022-12-28 ENCOUNTER — Ambulatory Visit (INDEPENDENT_AMBULATORY_CARE_PROVIDER_SITE_OTHER): Payer: Medicare Other | Admitting: Physical Therapy

## 2022-12-28 DIAGNOSIS — R600 Localized edema: Secondary | ICD-10-CM

## 2022-12-28 DIAGNOSIS — M25561 Pain in right knee: Secondary | ICD-10-CM | POA: Diagnosis not present

## 2022-12-28 DIAGNOSIS — M25562 Pain in left knee: Secondary | ICD-10-CM

## 2022-12-28 DIAGNOSIS — R262 Difficulty in walking, not elsewhere classified: Secondary | ICD-10-CM | POA: Diagnosis not present

## 2022-12-28 DIAGNOSIS — M6281 Muscle weakness (generalized): Secondary | ICD-10-CM

## 2022-12-28 DIAGNOSIS — M25571 Pain in right ankle and joints of right foot: Secondary | ICD-10-CM | POA: Diagnosis not present

## 2022-12-28 DIAGNOSIS — G8929 Other chronic pain: Secondary | ICD-10-CM

## 2022-12-28 NOTE — Therapy (Addendum)
OUTPATIENT PHYSICAL THERAPY TREATMENT /DISCHARGE  Patient Name: Jessica Roberts MRN: [id number] DOB:April 16, 1946, 77 y.o., female Today's Date: 12/28/2022     END OF SESSION:   PT End of Session - 12/28/22 0852     Visit Number 16    Number of Visits 20    Date for PT Re-Evaluation 01/18/23    Progress Note Due on Visit 20    PT Start Time 0845    PT Stop Time 0930    PT Time Calculation (min) 45 min    Activity Tolerance Patient tolerated treatment well    Behavior During Therapy [facility] for tasks assessed/performed                   Past Medical History:  Diagnosis Date   Allergy    Anxiety    Anxiety and depression    Breast cancer (HCC) 04/07/15   right breast   Breast cancer of upper-outer quadrant of right female breast (HCC) 04/11/2015   Broken nose    Complication of anesthesia    "had a very hard time waking up"   Dengue fever    Depression    Fall    broken nose   GERD (gastroesophageal reflux disease)    Head injury 1995   concussion in [place], med evac to [place], ear damage with vertigo   Head injury, acute, with loss of consciousness (HCC) 2000   [place] the [place]   Head injury, closed, with concussion 1986   [place]   Headache    Hypertension    Malaria    PONV (postoperative nausea and vomiting)    Typhoid    Past Surgical History:  Procedure Laterality Date   BREAST LUMPECTOMY Right 04/27/2015   BREAST LUMPECTOMY WITH NEEDLE LOCALIZATION AND AXILLARY SENTINEL LYMPH NODE BX Right 04/27/2015   Procedure: RIGHT BREAST LUMPECTOMY WITH  TWO NEEDLE LOCALIZATION AND TWO RIGHT AXILLARY SENTINEL LYMPH NODE BX;  Surgeon: Claud Kelp, MD;  Location: [facility] OR;  Service: General;  Laterality: Right;   COLONOSCOPY W/ BIOPSIES AND POLYPECTOMY     NERVE SURGERY     '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' during surgery for endometriosis   surgical procedure for endometriosis     TONSILLECTOMY     Patient Active Problem List   Diagnosis Date  Noted   Early stage nonexudative age-related macular degeneration of both eyes 11/27/2021   Choroidal nevus, right 11/27/2021   Nuclear sclerotic cataract of both eyes 11/27/2021   Iron deficiency 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 (chronic) 07/19/2020   Breast cancer of upper-outer quadrant of right female breast (HCC) 04/11/2015     THERAPY DIAG:  Chronic pain of right knee  Difficulty in walking, not elsewhere classified  Pain in right ankle and joints of right foot  Chronic pain of left knee  Localized edema  Muscle weakness (generalized)   PCP: [name], MD   REFERRING PROVIDER:  Kathryne Hitch, MD   REFERRING DIAG:  Diagnosis  M25.561,M25.562 (ICD-10-CM) - Pain in both knees, unspecified chronicity    EVAL THERAPY DIAG:  Chronic pain of right knee  Chronic pain of left knee  Localized edema  Difficulty in walking, not elsewhere classified  Muscle weakness (generalized)  Pain in right ankle and joints of right foot  Rationale for Evaluation and Treatment: Rehabilitation  ONSET DATE: years ago   SUBJECTIVE STATEMENT: Pt relays some pain in her knee when she squats, the walked a mile yesterday so maybe pain  from that but not bad.   PERTINENT HISTORY: Allergies, breast CA, broken nose, h/o fall, depression, anxiety, head injury, HTN, breast lumpectomy  PAIN:  NPRS scale/location: 3/10 pain in ankle with end ranges of flexion Aggravating factors: up and down stairs, when transitioning from sit to stand Relieving factors: Tylenol, Voltaran gel  PRECAUTIONS: None  WEIGHT BEARING RESTRICTIONS: No  FALLS:  Has patient fallen in last 6 months? No  LIVING ENVIRONMENT: Lives with: lives with their family and lives alone Lives in: House/apartment Stairs: Yes: External: 4 steps; bilateral but cannot reach both Has following equipment at home: None  OCCUPATION: retired  PLOF: Independent  PATIENT GOALS: Get up and down out of recliner  with no pain or stiffness     Stop knee from "giving out" when going up steps   OBJECTIVE:   DIAGNOSTIC FINDINGS: Result Narrative from 09/05/22  2 views of the right knee show just slight medial joint space narrowing.   There is otherwise no acute findings.  PATIENT SURVEYS:  09/11/22: FOTO intake: 49   predicted:  62 12/14/22: FOTO improved to 55%  MUSCLE LENGTH: 09/11/22: Hamstrings: Right 40 deg; Left 45 deg   POSTURE: rounded shoulders and forward head, decreased lumbar lordosis  PALPATION: No pin point tenderness note at eval  LOWER EXTREMITY ROM:   ROM Right 09/11/22 Left 09/11/22 Rt 10/15/22 Rt 11/14/22 Left 11/14/22 Rt 12/19/22  Hip flexion 100 106      Hip extension        Hip abduction        Hip adduction        Hip internal rotation        Hip external rotation        Knee flexion 124 130  A: 142 A: 138   Knee extension 0 0  A: 0 A: 0   Ankle dorsiflexion 2 20 A: 6 P: 10 A:10 P: 12  A:12 P: 20  Ankle plantarflexion 18 32 A: 30 P: 35 A: 32 P: 36  A: 34 P: 36  Ankle inversion 34 40 A: 42 P: 42 A: 40 P: 42  A: 40 P; 44  Ankle eversion 30 34 A: 30 P: 45 A: 32 P: 44  A: 32 P: 45   (Blank rows = not tested)  LOWER EXTREMITY MMT:  MMT Right Eval 09/11/22 Left Eval 09/11/22 Rt 10/23/22 Left / Rt 11/21/22  Left / RT 12/19/22  Hip flexion 4/5 5/5 4+ 5 / 5 5 / 5  Hip extension       Hip abduction 4/5 4+/5 4+ 4+ / 4+ 5 / 5  Hip adduction 4/5 4+/5 4+ 4+ / 4+ 5 / 5  Hip internal rotation       Hip external rotation       Knee flexion 5/5 5/5 5 5  / 5 5 / 5  Knee extension 5/5 5/5 5 5  / 5 5 / 5  Ankle dorsiflexion 4/5 5/5 4+ 5 / 4+ 5 / 5  Ankle plantarflexion 4/5 5/5 4+ 5 / 4+ 5 / 5  Ankle inversion 4/5 5/5 4+ 5 / 4+ 5 / 5  Ankle eversion 4/5 5/5 4+  5 / 4+   (Blank rows = not tested)    FUNCTIONAL TESTS:  11/21/22:   5 time sit to stand: 10 seconds  with no UE support  10/23/22: 5 time sit to stand: 12 seconds with UE support      5 time sit to stand:  10 seconds  with no UE support   09/11/22: 5 time sit to stand: 16 seconds no UE support  GAIT: Distance walked: 30 feet  Assistive device utilized: None Level of assistance: Complete Independence Comments: mild decreased heel strike in Rt LE   TODAY'S TREATMENT                                                                          DATE: 12/28/22 Pt seen for aquatic therapy today.  Treatment took place in water 3.5-4.75 ft in depth at the [facility] pool. Temp of water was 91.  Pt entered/exited the pool via stairs with rail.  Pt requires the buoyancy and hydrostatic pressure of water for support, and to offload joints by unweighting joint load by at least 50 % in navel deep water and by at least 75-80% in chest to neck deep water.  Viscosity of the water is needed for resistance of strengthening. Water current perturbations provides challenge to standing balance requiring increased core activation. Water exercises: Lateral walking, forward walking, retro walking 3 round trips in pool horizontal (30 feet) Leg swings add/abd X 15 bilat with UE support cues for slow and controlled movement Leg swings flexion/extension X 15 bilat with UE support cues for slow and controlled movements March walking 3 round trips  Retro walking with hamstring curls 3 round trips 1/4 squat isometric hold with row/chest press holding kickboard X 20 reps 1/4 squat with shoulder extension/flexion holding kickboard  X15  reps Monster walks horizontal width of pool holding dumbells X 2 round trips Lateral walking with mini squat with bilateral shoulder abd/add with floating dumbells 3 round trips Standing 3 way stepping (front, side, back)  X10 bilat holding dumbells Heel raises off first step in pool X 15 Step ups onto first step in pool X 10 bilat Squats in shallow end of pooll X 20 reps Seated on pool bench: bilat hip flexion/extension X 20, bilat hip abd/add X 20, cycling X 20  12/21/22 Pt seen  for aquatic therapy today.  Treatment took place in water 3.5-4.75 ft in depth at the [facility] pool. Temp of water was 91.  Pt entered/exited the pool via stairs with rail.  Pt requires the buoyancy and hydrostatic pressure of water for support, and to offload joints by unweighting joint load by at least 50 % in navel deep water and by at least 75-80% in chest to neck deep water.  Viscosity of the water is needed for resistance of strengthening. Water current perturbations provides challenge to standing balance requiring increased core activation. Water exercises: Lateral walking, forward walking, retro walking 3 round trips in pool horizontal (30 feet) Leg swings add/abd X 15 bilat with UE support cues for slow and controlled movement Leg swings flexion/extension X 15 bilat with UE support cues for slow and controlled movements March walking 3 round trips  Retro walking with hamstring curls 3 round trips 1/4 squat isometric hold with row/chest press holding kickboard X 15 reps 1/4 squat with shoulder extension/flexion holding kickboard  X15  reps Monster walks horizontal width of pool holding dumbells X 2 round trips Lateral walking with mini squat with bilateral shoulder abd/add with floating dumbells 3 round trips Standing  3 way stepping (front, side, back)  X10 bilat holding dumbells Heel raises off first step in pool X 15 Step ups onto first step in pool X 10 bilat Squats on first step of pool X 15 reps  12/19/22 TherEx:  Calf stretch off edge of step x 4 holding 30 sec  ROM and MMT performed and updated  NMR:  Blaze Pods:  standing on level ground: 4 pod tap c UE's x 4  Staning on Airex: 4 pod tap c UE's x 4 Standing on Airex: 4 pod tap reaching across with her UE's x 4 Standing staggered stance: 4 pod tap reaching across with her UE's x 4: Rt arm tapping red light and Left arm tapping purple Manual:  K-tape: in "T" pattern with strip across patella both vertical and  horizontal for support   PATIENT EDUCATION:  Education details: HEP, POC Person educated: Patient Education method: Programmer, multimedia, Demonstration, Verbal cues, and Handouts Education comprehension: verbalized understanding, returned demonstration, and verbal cues required  HOME EXERCISE PROGRAM: Access Code: [email or web address] URL: [email or web address] Date: 11/14/2022 Prepared by: Narda Amber  Exercises - Supine Bridge  - 2 x daily - 2 sets - 10 reps - 5 seconds hold - Hooklying Hamstring Stretch with Strap  - 2 x daily - 3 reps - 30 seconds hold - Seated Hamstring Stretch  - 2 x daily - 3 reps - 30 seconds hold - Seated Ankle Alphabet  - 2 x daily - 1 reps - Heel Raises with Counter Support  - 2 x daily - 2 sets - 10 reps - Ankle Dorsiflexion with Resistance  - 1-2 x daily - 7 x weekly - 2 sets - 10 reps - Ankle Inversion with Resistance  - 1-2 x daily - 7 x weekly - 2 sets - 10 reps - Ankle Eversion with Resistance  - 1-2 x daily - 7 x weekly - 2 sets - 10 reps - Ankle and Toe Plantarflexion with Resistance  - 1-2 x daily - 7 x weekly - 2 sets - 10 reps - Side Stepping with Resistance at Thighs  - 1 x daily - 7 x weekly - 1 reps  ASSESSMENT:  CLINICAL IMPRESSION: She showed good overall tolerance with her aquatic PT exercises and has ordered KT tape so she can tape herself at home. She has one more visit scheduled at this time so reassess for progress and need to continue with PT.   OBJECTIVE IMPAIRMENTS: decreased activity tolerance, difficulty walking, decreased ROM, decreased strength, and pain.   ACTIVITY LIMITATIONS: standing, squatting, stairs, and transfers  PARTICIPATION LIMITATIONS: cleaning and community activity  PERSONAL FACTORS: 3+ comorbidities: see above history are also affecting patient's functional outcome.   REHAB POTENTIAL: Good  CLINICAL DECISION MAKING: Stable/uncomplicated  EVALUATION COMPLEXITY: Low   GOALS: Goals reviewed with patient?  Yes  SHORT TERM GOALS: (target date for Short term goals are 3 weeks 10/06/22)   1.  Patient will demonstrate independent use of home exercise program to maintain progress from in clinic treatments.  Goal status: MET 10/15/22  LONG TERM GOALS: (target dates for all long term goals are 10 weeks  11/23/22 )   1. Patient will demonstrate/report pain at worst less than or equal to 2/10 to facilitate minimal limitation in daily activity secondary to pain symptoms.  Goal status: On-going 12/14/22   2. Patient will demonstrate independent use of home exercise program to facilitate ability to maintain/progress functional gains from skilled physical therapy services.  Goal status: MET  12/14/22   3. Patient will demonstrate FOTO outcome > or = 62 % to indicate reduced disability due to condition.  Goal status: On-going 12/14/22   4.  Pt will be able to amb up and down 1 flight of stairs with single hand rail with step over step gait pattern with no pain reported.   Goal status: On-going 11/14/22   5.  Patient will be able to improve her 5 time sit to stand to </= 12 seconds with no UE support for improved functional mobility   Goal status: MET 10/23/22   6.  Patient will be able to report able 20 minutes with no pain in bil knees or Rt ankle.   Goal status: On-going 12/14/22    PLAN:  PT FREQUENCY: 1-2x/week  PT DURATION: 10 weeks  PLANNED INTERVENTIONS: Therapeutic exercises, Therapeutic activity, Neuro Muscular re-education, Balance training, Gait training, Patient/Family education, Joint mobilization, Stair training, DME instructions, Dry Needling, Electrical stimulation, Traction, Cryotherapy, vasopneumatic deviceMoist heat, Taping, Ultrasound, Ionotophoresis 4mg /ml Dexamethasone, and Manual therapy.  All included unless contraindicated  PLAN FOR NEXT SESSION:   Taping and or aquatic PT as needed general LE strengthening, and functional mobility training, ankle stability  Ivery Quale, PT,  DPT 12/28/22 8:52 AM    PHYSICAL THERAPY DISCHARGE SUMMARY  Visits from Start of Care: 16  Current functional level related to goals / functional outcomes: See note   Remaining deficits: See note   Education / Equipment: HEP  Patient goals were partially met. Patient is being discharged due to not returning since the last visit.  Chyrel Masson, PT, DPT, OCS, ATC 01/31/23  3:21 PM

## 2023-01-08 ENCOUNTER — Telehealth: Payer: Self-pay

## 2023-01-08 NOTE — Telephone Encounter (Addendum)
Called patient and relayed message below as per Dr. [name], patient voiced full understanding.   ----- Message from Malachy Mood, MD sent at 01/07/2023  4:25 PM EDT ----- Please let pt know her lab from 5/8 was good, anemia resolved, iron level normal. Please change her lab order to under Lacie's name, and add lab appointment on day of her next visit with lacie in 06/2023, thx   Malachy Mood

## 2023-01-09 ENCOUNTER — Encounter: Payer: Medicare Other | Admitting: Physical Therapy

## 2023-01-15 DIAGNOSIS — D3131 Benign neoplasm of right choroid: Secondary | ICD-10-CM | POA: Diagnosis not present

## 2023-01-15 DIAGNOSIS — Z961 Presence of intraocular lens: Secondary | ICD-10-CM | POA: Diagnosis not present

## 2023-01-15 DIAGNOSIS — H353131 Nonexudative age-related macular degeneration, bilateral, early dry stage: Secondary | ICD-10-CM | POA: Diagnosis not present

## 2023-01-30 DIAGNOSIS — H16141 Punctate keratitis, right eye: Secondary | ICD-10-CM | POA: Diagnosis not present

## 2023-02-04 DIAGNOSIS — E119 Type 2 diabetes mellitus without complications: Secondary | ICD-10-CM | POA: Diagnosis not present

## 2023-02-22 DIAGNOSIS — E119 Type 2 diabetes mellitus without complications: Secondary | ICD-10-CM | POA: Diagnosis not present

## 2023-02-22 DIAGNOSIS — F32A Depression, unspecified: Secondary | ICD-10-CM | POA: Diagnosis not present

## 2023-02-22 DIAGNOSIS — F4310 Post-traumatic stress disorder, unspecified: Secondary | ICD-10-CM | POA: Diagnosis not present

## 2023-03-05 ENCOUNTER — Ambulatory Visit: Payer: Medicare Other

## 2023-03-21 ENCOUNTER — Telehealth: Payer: Self-pay | Admitting: Nurse Practitioner

## 2023-03-21 ENCOUNTER — Inpatient Hospital Stay: Payer: Medicare Other

## 2023-03-27 ENCOUNTER — Ambulatory Visit: Payer: Medicare Other

## 2023-03-28 ENCOUNTER — Inpatient Hospital Stay: Payer: Medicare Other | Attending: Hematology

## 2023-04-18 DIAGNOSIS — R350 Frequency of micturition: Secondary | ICD-10-CM | POA: Diagnosis not present

## 2023-04-18 DIAGNOSIS — R319 Hematuria, unspecified: Secondary | ICD-10-CM | POA: Diagnosis not present

## 2023-05-01 DIAGNOSIS — F331 Major depressive disorder, recurrent, moderate: Secondary | ICD-10-CM | POA: Diagnosis not present

## 2023-05-01 DIAGNOSIS — Z87891 Personal history of nicotine dependence: Secondary | ICD-10-CM | POA: Diagnosis not present

## 2023-05-01 DIAGNOSIS — R350 Frequency of micturition: Secondary | ICD-10-CM | POA: Diagnosis not present

## 2023-05-02 DIAGNOSIS — R102 Pelvic and perineal pain: Secondary | ICD-10-CM | POA: Diagnosis not present

## 2023-05-02 DIAGNOSIS — N898 Other specified noninflammatory disorders of vagina: Secondary | ICD-10-CM | POA: Diagnosis not present

## 2023-05-02 DIAGNOSIS — R3915 Urgency of urination: Secondary | ICD-10-CM | POA: Diagnosis not present

## 2023-05-10 DIAGNOSIS — K219 Gastro-esophageal reflux disease without esophagitis: Secondary | ICD-10-CM | POA: Diagnosis not present

## 2023-05-10 DIAGNOSIS — Z0000 Encounter for general adult medical examination without abnormal findings: Secondary | ICD-10-CM | POA: Diagnosis not present

## 2023-05-10 DIAGNOSIS — K2970 Gastritis, unspecified, without bleeding: Secondary | ICD-10-CM | POA: Diagnosis not present

## 2023-05-10 DIAGNOSIS — D5 Iron deficiency 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 (chronic): Secondary | ICD-10-CM | POA: Diagnosis not present

## 2023-05-10 DIAGNOSIS — I2510 Atherosclerotic heart disease of native coronary artery without angina pectoris: Secondary | ICD-10-CM | POA: Diagnosis not present

## 2023-05-23 DIAGNOSIS — R102 Pelvic and perineal pain: Secondary | ICD-10-CM | POA: Diagnosis not present

## 2023-06-12 DIAGNOSIS — C44622 Squamous cell carcinoma of skin of right upper limb, including shoulder: Secondary | ICD-10-CM | POA: Diagnosis not present

## 2023-06-12 DIAGNOSIS — Z85828 Personal history of other malignant neoplasm of skin: Secondary | ICD-10-CM | POA: Diagnosis not present

## 2023-06-12 DIAGNOSIS — L820 Inflamed seborrheic keratosis: Secondary | ICD-10-CM | POA: Diagnosis not present

## 2023-06-12 DIAGNOSIS — L821 Other seborrheic keratosis: Secondary | ICD-10-CM | POA: Diagnosis not present

## 2023-06-18 DIAGNOSIS — R9389 Abnormal findings on diagnostic imaging of other specified body structures: Secondary | ICD-10-CM | POA: Diagnosis not present

## 2023-06-19 NOTE — Progress Notes (Deleted)
Patient Care Team: [name], MD as PCP - General (Family Medicine) Parke Poisson, MD as PCP - Cardiology (Cardiology) Malachy Mood, MD as Consulting Physician (Hematology) Charlott Rakes, MD as Consulting Physician (Gastroenterology) Luciana Axe Alford Highland, MD as Consulting Physician (Ophthalmology)  Clinic Day:  06/19/2023  Referring physician: [name], MD  ASSESSMENT & PLAN:   Assessment & Plan: Breast cancer of upper-outer quadrant of right female breast  Right breast invasive ductal carcinoma, pT1bN0M0, stage IA, grade 1, ER positive, PR positive, HER-2 negative, (+) DCIS and ADH -Diagnosed in 2016. S/p right lumpectomy and radiation. She declined antihormonal therapy, now on surveillance -most recent mammogram on 03/01/22 was negative. -aside persistent breast tenderness, she is clinically doing well. Her physical exam showed tenderness just below her previous incision site but was otherwise unremarkable. There is no clinical concern for recurrence. -Continue surveillance. Mammogram was due 02/2023. Ordered but results unavailable. Unsure if done or if results need to be requested.  -F/u in 12 months   Iron deficiency 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 (chronic) Iron deficient anemia -h/o anemia intermittently since 2020. Pt denies overt bleeding. -She had colonoscopy and endoscopy (including small bowel) on 07/15/20 with Dr Bosie Clos. Per patient results were benign, no bleeding with hiatal hernia and diverticulosis and gastritis and gallstones. -She is on Oral iron once daily. She was treated with IV Venofer 400mg  x3 each in 07/2020 and 01/2021. -labs reviewed, her hgb has dropped again to 10.4. Today's ferritin is pending, but her last on 03/22/22 was 12. I recommend three doses of IV Venofer weekly anytime in the next few weeks. She is agreeable. -will continue to monitor with labs every three months.    The patient understands the plans discussed today and is in agreement  with them.  She knows to contact our office if she develops concerns prior to her next appointment.  I provided *** minutes of face-to-face time during this encounter and > 50% was spent counseling as documented under my assessment and plan.    [name], NP  [facility] - [facility] DEPT [facility] [address] [place] [address] Dept: [phone] Dept Fax: [phone]   No orders of the defined types were placed in this encounter.     CHIEF COMPLAINT:  CC: f/u right breast cancer and iron deficiency anemia  Current Treatment:  surveillance for breast cancer. IV iron as needed   INTERVAL HISTORY:  Lachina is here today for repeat clinical assessment. She last saw Dr. [name] 06/21/2022. Overdue for mammogram. She denies fevers or chills. She denies pain. Her appetite is good. Her weight {Weight change:10426}.  I have reviewed the past medical history, past surgical history, social history and family history with the patient and they are unchanged from previous note.  ALLERGIES:  is allergic to codeine, oxycontin [oxycodone hcl], percocet [oxycodone-acetaminophen], and percodan [oxycodone-aspirin].  MEDICATIONS:  Current Outpatient Medications  Medication Sig Dispense Refill   acetaminophen (TYLENOL) 500 MG tablet Take 500 mg by mouth 2 (two) times daily as needed for moderate pain (pain). Reported on 10/12/2015     ADVAIR DISKUS 250-50 MCG/ACT AEPB Inhale 1 puff into the lungs 2 (two) times daily.     albuterol (PROVENTIL HFA;VENTOLIN HFA) 108 (90 Base) MCG/ACT inhaler Inhale 1 puff into the lungs every 6 (six) hours as needed for wheezing or shortness of breath.     ARIPiprazole (ABILIFY) 2 MG tablet Take 2 mg  by mouth daily.     metroNIDAZOLE (METROGEL) 0.75 % gel metronidazole 0.75 % topical gel     pantoprazole (PROTONIX) 40 MG tablet Take 1 tablet every day by oral route.     prazosin (MINIPRESS) 2 MG  capsule Take 2 mg by mouth at bedtime.     predniSONE (DELTASONE) 20 MG tablet Take 1 tablet (20 mg total) by mouth daily with breakfast. Take one tablet daily for 5 days. 5 tablet 0   rosuvastatin (CRESTOR) 40 MG tablet Take 40 mg by mouth daily.     sertraline (ZOLOFT) 100 MG tablet Take 100 mg by mouth daily.      TRELEGY ELLIPTA 100-62.5-25 MCG/ACT AEPB Inhale 1 puff into the lungs daily.     No current facility-administered medications for this visit.    HISTORY OF PRESENT ILLNESS:   Oncology History Overview Note  Cancer Staging Breast cancer of upper-outer quadrant of right female breast [facility]) Staging form: Breast, AJCC 7th Edition - Clinical stage from 04/07/2015: Stage IA (T1a, N0, M0) - Signed by Malachy Mood, MD on 05/20/2015 - Pathologic stage from 04/27/2015: Stage IA (T1b, N0, cM0) - Signed by Malachy Mood, MD on 05/20/2015       Breast cancer of upper-outer quadrant of right female breast (HCC)  04/01/2015 Mammogram   Diagnostic mammogram and US showed a 0.7x 0.4 x 0.4 cm mass in the right breast 11:00 position, and a second 5mm lesion at upper-midline, and a benign cluster of cysts measuring 1cm.    04/07/2015 Initial Biopsy   Right breast UOQ mass core needle biopsy showed invasive ductal carcinoma and DCIS, grade 1. ER+ (100%), PR+ (80%), HER2/neu negative (ratio 1.25), Ki67 5%.   04/07/2015 Clinical Stage   Stage IA: T1a N0   04/27/2015 Definitive Surgery   Right breast double lumpectomy/SLNB: IDC, 0.9 cm, DCIS, invasive dz 0.1 cm from posterior margin, rem. margins >0.5 cm; second lump: fibrocystic change. 1 LN negative for malignancy   04/27/2015 Pathologic Stage   Stage IA: T1b N0   06/21/2015 - 07/13/2015 Radiation Therapy   Adjuvant RT: Right breast 42.72 Gy over 21 fractions.     Anti-estrogen oral therapy   Pt declined adjuvant anti-estrogen therapy    09/29/2015 Survivorship   Survivorship visit completed and copy of care plan given to patient.   07/17/2016  Imaging   MM DIAG BREAST TOMO BILATERALL 07/17/16 MPRESSION: Probable mildly complicated fluid collection/seroma within the right breast 1130 o'clock along the posterior margin of the lumpectomy site.   09/04/2016 Imaging   US BREAST LTD UNI RIGHT INC AXILLA: 09/04/16 IMPRESSION: Stable probably benign postsurgical fluid collection/seroma within the right breast at the 11:30 o'clock axis, 6 cm from the nipple, measuring 2.3 x 0.9 x 1.2 cm. Recommend additional follow-up right breast diagnostic mammogram and ultrasound in 6 months to ensure continued stability.   03/21/2017 Mammogram   R breast Mammogram and [place]  Mammographic images were processed with CAD.   Targeted ultrasound is performed, showing slightly smaller mixed echogenicity collection within the right breast at 11:30 o'clock 6 cm from nipple measuring 0.9 x 0.7 x 1.4 cm. This is probably a postsurgical seroma.   07/31/2017 Mammogram   IMPRESSION: 1. Indeterminate 9 mm group of fine pleomorphic calcifications involving the upper outer quadrant of the right breast at posterior depth. 2. New partially obscured mass involving the inner left breast at anterior posterior depth.   08/07/2017 Breast US   Targeted right breast ultrasound is performed, showing  the previously identified mildly complex fluid collection at the lumpectomy site at the 11:30 o'clock approximately 6 cm from the nipple has decreased in size in the interval, currently measuring 6 x 8 x 8 mm (previously 7 x 9 by 14 mm). No new suspicious solid mass or abnormal acoustic shadowing is identified.   Targeted left breast ultrasound is performed, showing that the new mammographic mass corresponds to an oval circumscribed parallel hypoechoic mass at the 10 o'clock position approximately 4 cm from the nipple measuring approximately 5 x 4 x 5 mm, demonstrating slight posterior acoustic enhancement and no internal power Doppler flow.   IMPRESSION: 1. Interval decrease in  size of the postoperative seroma at the lumpectomy site in the upper outer quadrant of the right breast. 2. Likely benign 5 mm complex cyst in the upper inner quadrant of the left breast accounting for the new mammographic finding.   08/07/2017 Procedure   aspiration of the complex cyst in the upper inner quadrant of the left breast at the 10 o'clock position approximately 4 cm from the nipple. Less than 1 cc of cyst fluid was aspirated. The cyst completely collapsed with aspiration and there is no associated solid component.   08/07/2017 Pathology Results   Diagnosis Breast, left, needle core biopsy, UIQ at posterior depth - BENIGN BREAST TISSUE WITH CALCIFICATIONS. - NO MALIGNANCY IDENTIFIED. - SEE COMMENT.    03/10/2018 Mammogram    03/10/2018 Mammogram IMPRESSION: No mammographic evidence of malignancy involving the LEFT breast.       REVIEW OF SYSTEMS:   Constitutional: Denies fevers, chills or abnormal weight loss Eyes: Denies blurriness of vision Ears, nose, mouth, throat, and face: Denies mucositis or sore throat Respiratory: Denies cough, dyspnea or wheezes Cardiovascular: Denies palpitation, chest discomfort or lower extremity swelling Gastrointestinal:  Denies nausea, heartburn or change in bowel habits Skin: Denies abnormal skin rashes Lymphatics: Denies new lymphadenopathy or easy bruising Neurological:Denies numbness, tingling or new weaknesses Behavioral/Psych: Mood is stable, no new changes  All other systems were reviewed with the patient and are negative.   VITALS:  There were no vitals taken for this visit.  Wt Readings from Last 3 Encounters:  06/21/22 183 lb (83 kg)  02/05/22 182 lb 6.4 oz (82.7 kg)  12/20/21 178 lb 11.2 oz (81.1 kg)    There is no height or weight on file to calculate BMI.  Performance status (ECOG): {CHL ONC [id number]  PHYSICAL EXAM:   GENERAL:alert, no distress and comfortable SKIN: skin color, texture, turgor are normal,  no rashes or significant lesions EYES: normal, Conjunctiva are pink and non-injected, sclera clear OROPHARYNX:no exudate, no erythema and lips, buccal mucosa, and tongue normal  NECK: supple, thyroid normal size, non-tender, without nodularity LYMPH:  no palpable lymphadenopathy in the cervical, axillary or inguinal LUNGS: clear to auscultation and percussion with normal breathing effort HEART: regular rate & rhythm and no murmurs and no lower extremity edema ABDOMEN:abdomen soft, non-tender and normal bowel sounds Musculoskeletal:no cyanosis of digits and no clubbing  NEURO: alert & oriented x 3 with fluent speech, no focal motor/sensory deficits  LABORATORY DATA:  I have reviewed the data as listed    Component Value Date/Time   NA 141 12/28/2020 1118   NA 141 04/01/2017 1523   K 4.7 12/28/2020 1118   K 3.8 04/01/2017 1523   CL 108 12/28/2020 1118   CO2 25 12/28/2020 1118   CO2 25 04/01/2017 1523   GLUCOSE 104 (H) 12/28/2020 1118   GLUCOSE 120  04/01/2017 1523   BUN 18 12/28/2020 1118   BUN 13.6 04/01/2017 1523   CREATININE 0.72 12/28/2020 1118   CREATININE 0.8 04/01/2017 1523   CALCIUM 9.2 12/28/2020 1118   CALCIUM 9.5 04/01/2017 1523   PROT 7.1 12/28/2020 1118   PROT 7.3 04/01/2017 1523   ALBUMIN 3.5 12/28/2020 1118   ALBUMIN 3.5 04/01/2017 1523   AST 12 (L) 12/28/2020 1118   AST 16 04/01/2017 1523   ALT 10 12/28/2020 1118   ALT 21 04/01/2017 1523   ALKPHOS 101 12/28/2020 1118   ALKPHOS 93 04/01/2017 1523   BILITOT 0.4 12/28/2020 1118   BILITOT <0.22 04/01/2017 1523   GFRNONAA >60 12/28/2020 1118   GFRAA >60 04/19/2020 1114    No results found for: "SPEP", "UPEP"  Lab Results  Component Value Date   WBC 8.5 12/19/2022   NEUTROABS 4.5 12/19/2022   HGB 13.7 12/19/2022   HCT 42.3 12/19/2022   MCV 93.2 12/19/2022   PLT 295 12/19/2022      Chemistry      Component Value Date/Time   NA 141 12/28/2020 1118   NA 141 04/01/2017 1523   K 4.7 12/28/2020 1118    K 3.8 04/01/2017 1523   CL 108 12/28/2020 1118   CO2 25 12/28/2020 1118   CO2 25 04/01/2017 1523   BUN 18 12/28/2020 1118   BUN 13.6 04/01/2017 1523   CREATININE 0.72 12/28/2020 1118   CREATININE 0.8 04/01/2017 1523      Component Value Date/Time   CALCIUM 9.2 12/28/2020 1118   CALCIUM 9.5 04/01/2017 1523   ALKPHOS 101 12/28/2020 1118   ALKPHOS 93 04/01/2017 1523   AST 12 (L) 12/28/2020 1118   AST 16 04/01/2017 1523   ALT 10 12/28/2020 1118   ALT 21 04/01/2017 1523   BILITOT 0.4 12/28/2020 1118   BILITOT <0.22 04/01/2017 1523       RADIOGRAPHIC STUDIES: I have personally reviewed the radiological images as listed and agreed with the findings in the report. No results found.

## 2023-06-19 NOTE — Assessment & Plan Note (Deleted)
Right breast invasive ductal carcinoma, pT1bN0M0, stage IA, grade 1, ER positive, PR positive, HER-2 negative, (+) DCIS and ADH -Diagnosed in 2016. S/p right lumpectomy and radiation. She declined antihormonal therapy, now on surveillance -most recent mammogram on 03/01/22 was negative. -aside persistent breast tenderness, she is clinically doing well. Her physical exam showed tenderness just below her previous incision site but was otherwise unremarkable. There is no clinical concern for recurrence. -Continue surveillance. Mammogram was due 02/2023. Ordered but results unavailable. Unsure if done or if results need to be requested.  -F/u in 12 months

## 2023-06-19 NOTE — Assessment & Plan Note (Deleted)
Iron deficient anemia -h/o anemia intermittently since 2020. Pt denies overt bleeding. -She had colonoscopy and endoscopy (including small bowel) on 07/15/20 with Dr Bosie Clos. Per patient results were benign, no bleeding with hiatal hernia and diverticulosis and gastritis and gallstones. -She is on Oral iron once daily. She was treated with IV Venofer 400mg  x3 each in 07/2020 and 01/2021. -labs reviewed, her hgb has dropped again to 10.4. Today's ferritin is pending, but her last on 03/22/22 was 12. I recommend three doses of IV Venofer weekly anytime in the next few weeks. She is agreeable. -will continue to monitor with labs every three months.

## 2023-06-20 ENCOUNTER — Inpatient Hospital Stay: Payer: Medicare Other | Attending: Hematology | Admitting: Nurse Practitioner

## 2023-06-20 DIAGNOSIS — C50411 Malignant neoplasm of upper-outer quadrant of right female breast: Secondary | ICD-10-CM | POA: Insufficient documentation

## 2023-06-20 DIAGNOSIS — Z170 Estrogen receptor positive status [ER+]: Secondary | ICD-10-CM | POA: Insufficient documentation

## 2023-06-20 DIAGNOSIS — D5 Iron deficiency 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 (chronic): Secondary | ICD-10-CM

## 2023-06-20 DIAGNOSIS — D509 Iron deficiency anemia, unspecified: Secondary | ICD-10-CM | POA: Insufficient documentation

## 2023-06-21 ENCOUNTER — Telehealth: Payer: Self-pay | Admitting: Nurse Practitioner

## 2023-06-30 ENCOUNTER — Other Ambulatory Visit: Payer: Self-pay | Admitting: Nurse Practitioner

## 2023-06-30 DIAGNOSIS — Z170 Estrogen receptor positive status [ER+]: Secondary | ICD-10-CM

## 2023-06-30 DIAGNOSIS — D5 Iron deficiency 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 (chronic): Secondary | ICD-10-CM

## 2023-06-30 NOTE — Assessment & Plan Note (Signed)
 Iron deficient anemia -h/o anemia intermittently since 2020. Pt denies overt bleeding. -She had colonoscopy and endoscopy (including small bowel) on 07/15/20 with Dr Bosie Clos. Per patient results were benign, no bleeding with hiatal hernia and diverticulosis and gastritis and gallstones. -She is on Oral iron once daily. She was treated with IV Venofer 400mg  x3 each in 07/2020 and 01/2021. -labs reviewed, her hgb has dropped again to 10.4. Today's ferritin is pending, but her last on 03/22/22 was 12. I recommend three doses of IV Venofer weekly anytime in the next few weeks. She is agreeable. -will continue to monitor with labs every three months.

## 2023-06-30 NOTE — Assessment & Plan Note (Signed)
Right breast invasive ductal carcinoma, pT1bN0M0, stage IA, grade 1, ER positive, PR positive, HER-2 negative, (+) DCIS and ADH -Diagnosed in 2016. S/p right lumpectomy and radiation. She declined antihormonal therapy, now on surveillance -most recent mammogram on 03/01/22 was negative. -aside from persistent breast tenderness, she is clinically doing well. Her physical exam showed tenderness just below her previous incision site but was otherwise unremarkable. There is no clinical concern for recurrence. -Continue surveillance. Mammogram was due 02/2023. Ordered but results unavailable. Unsure if done or if results need to be requested.  -F/u in 12 months

## 2023-06-30 NOTE — Progress Notes (Unsigned)
Patient Care Team: Jessica Putt, MD as PCP - General (Family Medicine) Jessica Poisson, MD as PCP - Cardiology (Cardiology) Jessica Mood, MD as Consulting Physician (Hematology) Jessica Rakes, MD as Consulting Physician (Gastroenterology) Jessica Axe Alford Highland, MD as Consulting Physician (Ophthalmology)  Clinic Day:  07/03/2023  Referring physician: [name], MD  ASSESSMENT & PLAN:   Assessment & Plan: Breast cancer of upper-outer quadrant of right female breast  Right breast invasive ductal carcinoma, pT1bN0M0, stage IA, grade 1, ER positive, PR positive, HER-2 negative, (+) DCIS and ADH -Diagnosed in 2016. S/p right lumpectomy and radiation. She declined antihormonal therapy, now on surveillance -most recent mammogram on 03/01/22 was negative. -aside from persistent breast tenderness, she is clinically doing well. Her physical exam showed tenderness just below her previous incision site but was otherwise unremarkable. There is no clinical concern for recurrence. -Continue surveillance. Mammogram was due 02/2023. Ordered but results unavailable. Unsure if done or if results need to be requested.  -F/u in 12 months   Iron deficiency 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 (chronic) Iron deficient anemia -h/o anemia intermittently since 2020. Pt denies overt bleeding. -She had colonoscopy and endoscopy (including small bowel) on 07/15/20 with Dr Jessica Roberts. Per patient results were benign, no bleeding with hiatal hernia and diverticulosis and gastritis and gallstones. -She is on Oral iron once daily. She was treated with IV Venofer 400mg  x3 each in 07/2020 and 01/2021. -labs reviewed, her hgb has dropped again to 10.4. Today's ferritin is pending, but her last on 03/22/22 was 12. I recommend three doses of IV Venofer weekly anytime in the next few weeks. She is agreeable. -will continue to monitor with labs every three months.    Plan: Labs reviewed  -CBC showing WBC 7.2; Hgb 13.2; Hct 40.3;  Plt 281; Anc 4.2 -CMP - K 4.3; glucose 312; BUN 11; Creatinine 0.66; eGFR >60; Ca 9.3; LFTs normal  -iron 68; TIBC 410; saturation ratio 17; ferritin pending --patient to restart oral iron supplement.  overdue for screening mammogram. New order placed today.    Continue yearly breast cancer surveillance. Labs and follow up in 1 year  The patient understands the plans discussed today and is in agreement with them.  She knows to contact our office if she develops concerns prior to her next appointment.  I provided 25 minutes of face-to-face time during this encounter and > 50% was spent counseling as documented under my assessment and plan.    Jessica Jews, NP  [facility] - A DEPT OF [facility] [address] [place] [address] Dept: [phone] Dept Fax: [phone]   No orders of the defined types were placed in this encounter.     CHIEF COMPLAINT:  CC: f/u right breast cancer and iron deficiency anemia  Current Treatment:  surveillance for breast cancer. IV iron as needed   INTERVAL HISTORY:  Jessica Roberts is here today for repeat clinical assessment. She last saw Dr. Mosetta Roberts 06/21/2022. Overdue for mammogram. New order placed today. States that she struggled with her depression for past 6 months. Larey Seat behind with routine care. Worked with psychiatrist to change medications. Has been on new regimen for about a month. Reports doing much better. States that she does need to see her therapist as well. She denies fevers or chills. She denies pain. Her appetite is good. Her weight has increased 6 pounds over last year .  I have reviewed the past medical history, past  surgical history, social history and family history with the patient and they are unchanged from previous note.  ALLERGIES:  is allergic to codeine, oxycontin [oxycodone hcl], percocet [oxycodone-acetaminophen], and percodan  [oxycodone-aspirin].  MEDICATIONS:  Current Outpatient Medications  Medication Sig Dispense Refill   rosuvastatin (CRESTOR) 40 MG tablet Take 40 mg by mouth daily.     traZODone (DESYREL) 50 MG tablet      acetaminophen (TYLENOL) 500 MG tablet Take 500 mg by mouth 2 (two) times daily as needed for moderate pain (pain). Reported on 10/12/2015     ADVAIR DISKUS 250-50 MCG/ACT AEPB Inhale 1 puff into the lungs 2 (two) times daily.     albuterol (PROVENTIL HFA;VENTOLIN HFA) 108 (90 Base) MCG/ACT inhaler Inhale 1 puff into the lungs every 6 (six) hours as needed for wheezing or shortness of breath.     ARIPiprazole (ABILIFY) 2 MG tablet Take 2 mg by mouth daily.     atorvastatin (LIPITOR) 20 MG tablet 1 tablet Orally Once a day for 30 day(s)     metroNIDAZOLE (METROGEL) 0.75 % gel metronidazole 0.75 % topical gel     pantoprazole (PROTONIX) 40 MG tablet Take 1 tablet every day by oral route.     prazosin (MINIPRESS) 2 MG capsule Take 2 mg by mouth at bedtime.     predniSONE (DELTASONE) 20 MG tablet Take 1 tablet (20 mg total) by mouth daily with breakfast. Take one tablet daily for 5 days. (Patient not taking: Reported on 07/01/2023) 5 tablet 0   sertraline (ZOLOFT) 100 MG tablet Take 100 mg by mouth daily.  (Patient not taking: Reported on 07/01/2023)     SUMAtriptan (IMITREX) 100 MG tablet Take by mouth.     TRELEGY ELLIPTA 100-62.5-25 MCG/ACT AEPB Inhale 1 puff into the lungs daily.     No current facility-administered medications for this visit.    HISTORY OF PRESENT ILLNESS:   Oncology History Overview Note  Cancer Staging Breast cancer of upper-outer quadrant of right female breast [facility]) Staging form: Breast, AJCC 7th Edition - Clinical stage from 04/07/2015: Stage IA (T1a, N0, M0) - Signed by Jessica Mood, MD on 05/20/2015 - Pathologic stage from 04/27/2015: Stage IA (T1b, N0, cM0) - Signed by Jessica Mood, MD on 05/20/2015       Breast cancer of upper-outer quadrant of right female breast  (HCC)  04/01/2015 Mammogram   Diagnostic mammogram and US showed a 0.7x 0.4 x 0.4 cm mass in the right breast 11:00 position, and a second 5mm lesion at upper-midline, and a benign cluster of cysts measuring 1cm.    04/07/2015 Initial Biopsy   Right breast UOQ mass core needle biopsy showed invasive ductal carcinoma and DCIS, grade 1. ER+ (100%), PR+ (80%), HER2/neu negative (ratio 1.25), Ki67 5%.   04/07/2015 Clinical Stage   Stage IA: T1a N0   04/27/2015 Definitive Surgery   Right breast double lumpectomy/SLNB: IDC, 0.9 cm, DCIS, invasive dz 0.1 cm from posterior margin, rem. margins >0.5 cm; second lump: fibrocystic change. 1 LN negative for malignancy   04/27/2015 Pathologic Stage   Stage IA: T1b N0   06/21/2015 - 07/13/2015 Radiation Therapy   Adjuvant RT: Right breast 42.72 Gy over 21 fractions.     Anti-estrogen oral therapy   Pt declined adjuvant anti-estrogen therapy    09/29/2015 Survivorship   Survivorship visit completed and copy of care plan given to patient.   07/17/2016 Imaging   MM DIAG BREAST TOMO BILATERALL 07/17/16 MPRESSION: Probable mildly complicated fluid collection/seroma  within the right breast 1130 o'clock along the posterior margin of the lumpectomy site.   09/04/2016 Imaging   US BREAST LTD UNI RIGHT INC AXILLA: 09/04/16 IMPRESSION: Stable probably benign postsurgical fluid collection/seroma within the right breast at the 11:30 o'clock axis, 6 cm from the nipple, measuring 2.3 x 0.9 x 1.2 cm. Recommend additional follow-up right breast diagnostic mammogram and ultrasound in 6 months to ensure continued stability.   03/21/2017 Mammogram   R breast Mammogram and [place]  Mammographic images were processed with CAD.   Targeted ultrasound is performed, showing slightly smaller mixed echogenicity collection within the right breast at 11:30 o'clock 6 cm from nipple measuring 0.9 x 0.7 x 1.4 cm. This is probably a postsurgical seroma.   07/31/2017 Mammogram    IMPRESSION: 1. Indeterminate 9 mm group of fine pleomorphic calcifications involving the upper outer quadrant of the right breast at posterior depth. 2. New partially obscured mass involving the inner left breast at anterior posterior depth.   08/07/2017 Breast US   Targeted right breast ultrasound is performed, showing the previously identified mildly complex fluid collection at the lumpectomy site at the 11:30 o'clock approximately 6 cm from the nipple has decreased in size in the interval, currently measuring 6 x 8 x 8 mm (previously 7 x 9 by 14 mm). No new suspicious solid mass or abnormal acoustic shadowing is identified.   Targeted left breast ultrasound is performed, showing that the new mammographic mass corresponds to an oval circumscribed parallel hypoechoic mass at the 10 o'clock position approximately 4 cm from the nipple measuring approximately 5 x 4 x 5 mm, demonstrating slight posterior acoustic enhancement and no internal power Doppler flow.   IMPRESSION: 1. Interval decrease in size of the postoperative seroma at the lumpectomy site in the upper outer quadrant of the right breast. 2. Likely benign 5 mm complex cyst in the upper inner quadrant of the left breast accounting for the new mammographic finding.   08/07/2017 Procedure   aspiration of the complex cyst in the upper inner quadrant of the left breast at the 10 o'clock position approximately 4 cm from the nipple. Less than 1 cc of cyst fluid was aspirated. The cyst completely collapsed with aspiration and there is no associated solid component.   08/07/2017 Pathology Results   Diagnosis Breast, left, needle core biopsy, UIQ at posterior depth - BENIGN BREAST TISSUE WITH CALCIFICATIONS. - NO MALIGNANCY IDENTIFIED. - SEE COMMENT.    03/10/2018 Mammogram    03/10/2018 Mammogram IMPRESSION: No mammographic evidence of malignancy involving the LEFT breast.       REVIEW OF SYSTEMS:   Constitutional: Denies fevers,  chills or abnormal weight loss Eyes: Denies blurriness of vision Ears, nose, mouth, throat, and face: Denies mucositis or sore throat Respiratory: Denies cough, dyspnea or wheezes Cardiovascular: Denies palpitation, chest discomfort or lower extremity swelling Gastrointestinal:  Denies nausea, heartburn or change in bowel habits Skin: Denies abnormal skin rashes Lymphatics: Denies new lymphadenopathy or easy bruising Neurological:Denies numbness, tingling or new weaknesses Behavioral/Psych: Roberts is stable. Working with psychiatrist on medication management for depression. Plans to make appointment with therapist also.  All other systems were reviewed with the patient and are negative.   VITALS:   Today's Vitals   07/01/23 1300 07/01/23 1327  BP: (!) 151/70 (!) 142/72  Pulse: 80   Resp: 18   Temp: 98.4 F (36.9 C)   TempSrc: Temporal   SpO2: 97%   Weight: 189 lb 9.6 oz (86 kg)  Height: 5\' 6"  (1.676 m)   PainSc: 0-No pain    Body mass index is 30.6 kg/m.   Wt Readings from Last 3 Encounters:  07/01/23 189 lb 9.6 oz (86 kg)  06/21/22 183 lb (83 kg)  02/05/22 182 lb 6.4 oz (82.7 kg)    Body mass index is 30.6 kg/m.  Performance status (ECOG): 1 - Symptomatic but completely ambulatory  PHYSICAL EXAM:   GENERAL:alert, no distress and comfortable SKIN: skin color, texture, turgor are normal, no rashes or significant lesions EYES: normal, Conjunctiva are pink and non-injected, sclera clear OROPHARYNX:no exudate, no erythema and lips, buccal mucosa, and tongue normal  NECK: supple, thyroid normal size, non-tender, without nodularity LYMPH:  no palpable lymphadenopathy in the cervical, axillary or inguinal LUNGS: clear to auscultation and percussion with normal breathing effort HEART: regular rate & rhythm and no murmurs and no lower extremity edema ABDOMEN:abdomen soft, non-tender and normal bowel sounds Musculoskeletal:no cyanosis of digits and no clubbing  NEURO: alert  & oriented x 3 with fluent speech, no focal motor/sensory deficits BREAST: well healed surgical scars in right axillary region and another along the right outer breast. No palpable masses or abnormalities. No right axillary lymphadenopathy noted. Left breast was without palpable abnormalities or masses. No left axillary lymphadenopathy.   LABORATORY DATA:  I have reviewed the data as listed    Component Value Date/Time   NA 139 07/01/2023 1234   NA 141 04/01/2017 1523   K 4.5 07/01/2023 1234   K 3.8 04/01/2017 1523   CL 108 07/01/2023 1234   CO2 27 07/01/2023 1234   CO2 25 04/01/2017 1523   GLUCOSE 312 (H) 07/01/2023 1234   GLUCOSE 120 04/01/2017 1523   BUN 11 07/01/2023 1234   BUN 13.6 04/01/2017 1523   CREATININE 0.66 07/01/2023 1234   CREATININE 0.8 04/01/2017 1523   CALCIUM 9.3 07/01/2023 1234   CALCIUM 9.5 04/01/2017 1523   PROT 7.4 07/01/2023 1234   PROT 7.3 04/01/2017 1523   ALBUMIN 4.2 07/01/2023 1234   ALBUMIN 3.5 04/01/2017 1523   AST 29 07/01/2023 1234   AST 16 04/01/2017 1523   ALT 34 07/01/2023 1234   ALT 21 04/01/2017 1523   ALKPHOS 72 07/01/2023 1234   ALKPHOS 93 04/01/2017 1523   BILITOT 0.4 07/01/2023 1234   BILITOT <0.22 04/01/2017 1523   GFRNONAA >60 07/01/2023 1234   GFRAA >60 04/19/2020 1114     Lab Results  Component Value Date   WBC 7.2 07/01/2023   NEUTROABS 4.2 07/01/2023   HGB 13.2 07/01/2023   HCT 40.3 07/01/2023   MCV 93.9 07/01/2023   PLT 281 07/01/2023

## 2023-07-01 ENCOUNTER — Inpatient Hospital Stay: Payer: Medicare Other

## 2023-07-01 ENCOUNTER — Other Ambulatory Visit: Payer: Self-pay | Admitting: Nurse Practitioner

## 2023-07-01 ENCOUNTER — Inpatient Hospital Stay: Payer: Medicare Other | Admitting: Nurse Practitioner

## 2023-07-01 VITALS — BP 142/72 | HR 80 | Temp 98.4°F | Resp 18 | Ht 66.0 in | Wt 189.6 lb

## 2023-07-01 DIAGNOSIS — C50411 Malignant neoplasm of upper-outer quadrant of right female breast: Secondary | ICD-10-CM

## 2023-07-01 DIAGNOSIS — Z1231 Encounter for screening mammogram for malignant neoplasm of breast: Secondary | ICD-10-CM

## 2023-07-01 DIAGNOSIS — D5 Iron deficiency 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 (chronic): Secondary | ICD-10-CM | POA: Diagnosis not present

## 2023-07-01 DIAGNOSIS — Z170 Estrogen receptor positive status [ER+]: Secondary | ICD-10-CM | POA: Diagnosis not present

## 2023-07-01 DIAGNOSIS — D509 Iron deficiency anemia, unspecified: Secondary | ICD-10-CM | POA: Diagnosis not present

## 2023-07-01 LAB — FERRITIN: Ferritin: 37 ng/mL (ref 11–307)

## 2023-07-01 LAB — CBC WITH DIFFERENTIAL (CANCER CENTER ONLY)
Abs Immature Granulocytes: 0.04 10*3/uL (ref 0.00–0.07)
Basophils Absolute: 0.1 10*3/uL (ref 0.0–0.1)
Basophils Relative: 1 %
Eosinophils Absolute: 0.3 10*3/uL (ref 0.0–0.5)
Eosinophils Relative: 4 %
HCT: 40.3 % (ref 36.0–46.0)
Hemoglobin: 13.2 g/dL (ref 12.0–15.0)
Immature Granulocytes: 1 %
Lymphocytes Relative: 29 %
Lymphs Abs: 2.1 10*3/uL (ref 0.7–4.0)
MCH: 30.8 pg (ref 26.0–34.0)
MCHC: 32.8 g/dL (ref 30.0–36.0)
MCV: 93.9 fL (ref 80.0–100.0)
Monocytes Absolute: 0.5 10*3/uL (ref 0.1–1.0)
Monocytes Relative: 8 %
Neutro Abs: 4.2 10*3/uL (ref 1.7–7.7)
Neutrophils Relative %: 57 %
Platelet Count: 281 10*3/uL (ref 150–400)
RBC: 4.29 MIL/uL (ref 3.87–5.11)
RDW: 13.7 % (ref 11.5–15.5)
WBC Count: 7.2 10*3/uL (ref 4.0–10.5)
nRBC: 0 % (ref 0.0–0.2)

## 2023-07-01 LAB — CMP (CANCER CENTER ONLY)
ALT: 34 U/L (ref 0–44)
AST: 29 U/L (ref 15–41)
Albumin: 4.2 g/dL (ref 3.5–5.0)
Alkaline Phosphatase: 72 U/L (ref 38–126)
Anion gap: 4 — ABNORMAL LOW (ref 5–15)
BUN: 11 mg/dL (ref 8–23)
CO2: 27 mmol/L (ref 22–32)
Calcium: 9.3 mg/dL (ref 8.9–10.3)
Chloride: 108 mmol/L (ref 98–111)
Creatinine: 0.66 mg/dL (ref 0.44–1.00)
GFR, Estimated: >60 mL/min (ref >60)
Glucose, Bld: 312 mg/dL — ABNORMAL HIGH (ref 70–99)
Potassium: 4.5 mmol/L (ref 3.5–5.1)
Sodium: 139 mmol/L (ref 135–145)
Total Bilirubin: 0.4 mg/dL (ref <1.2)
Total Protein: 7.4 g/dL (ref 6.5–8.1)

## 2023-07-01 LAB — IRON AND IRON BINDING CAPACITY (CC-WL,HP ONLY)
Iron: 68 ug/dL (ref 28–170)
Saturation Ratios: 17 % (ref 10.4–31.8)
TIBC: 410 ug/dL (ref 250–450)
UIBC: 342 ug/dL (ref 148–442)

## 2023-07-02 ENCOUNTER — Encounter: Payer: Self-pay | Admitting: Nurse Practitioner

## 2023-07-03 ENCOUNTER — Encounter: Payer: Self-pay | Admitting: Nurse Practitioner

## 2023-07-03 ENCOUNTER — Encounter: Payer: Self-pay | Admitting: Hematology

## 2023-07-05 ENCOUNTER — Ambulatory Visit
Admission: RE | Admit: 2023-07-05 | Discharge: 2023-07-05 | Disposition: A | Discharge status: Home / Self Care | Payer: Medicare Other | Source: Ambulatory Visit | Attending: Nurse Practitioner | Admitting: Nurse Practitioner

## 2023-07-05 DIAGNOSIS — Z1231 Encounter for screening mammogram for malignant neoplasm of breast: Secondary | ICD-10-CM

## 2023-07-17 ENCOUNTER — Telehealth: Payer: Self-pay | Admitting: *Deleted

## 2023-07-17 NOTE — Telephone Encounter (Signed)
   Pre-operative Risk Assessment    Patient Name: Jessica Roberts  DOB: [birth year] MRN: [id number]  DATE OF LAST VISIT: 02/05/22 Bernadene Person, NP DATE OF NEXT VISIT: NONE    Request for Surgical Clearance    Procedure:   HYST D&C  Date of Surgery:  Clearance 07/25/23                                 Surgeon:  DR. Vincente Poli Surgeon's Group or Practice Name:  [facility] Phone number:  [phone] Fax number:  [phone]   Type of Clearance Requested:   - Medical ; NONE INDICATED TO BE HELD   Type of Anesthesia:   PROPOFOL   Additional requests/questions:    Elpidio Anis   07/17/2023, 5:48 PM

## 2023-07-18 NOTE — Telephone Encounter (Signed)
    Primary Cardiologist:Gayatri Wynell Balloon, MD  Chart reviewed as part of pre-operative protocol coverage. Because of Vanesia Working Busbee's past medical history and time since last visit, he/she will require a follow-up visit in order to better assess preoperative cardiovascular risk.  Pre-op covering staff: - Please schedule  Office appointment and call patient to inform them. - Please contact requesting surgeon's office via preferred method (i.e, phone, fax) to inform them of need for appointment prior to surgery.  If applicable, this message will also be routed to pharmacy pool and/or primary cardiologist for input on holding anticoagulant/antiplatelet agent as requested below so that this information is available at time of patient's appointment.   Ronney Asters, NP  07/18/2023, 9:43 AM

## 2023-07-18 NOTE — Telephone Encounter (Signed)
Pt has been scheduled to see Gillian Shields, NP 07/22/23 @ 2:20 at our [facility] location. Pt aware of the location and is grateful for the appt.   I will update the surgeon office as well. No cardiac meds needing to be held.

## 2023-07-22 ENCOUNTER — Ambulatory Visit (HOSPITAL_BASED_OUTPATIENT_CLINIC_OR_DEPARTMENT_OTHER): Payer: Medicare Other | Admitting: Family

## 2023-07-22 ENCOUNTER — Encounter (HOSPITAL_BASED_OUTPATIENT_CLINIC_OR_DEPARTMENT_OTHER): Payer: Self-pay | Admitting: Family

## 2023-07-22 VITALS — BP 124/82 | HR 68 | Ht 66.0 in | Wt 187.9 lb

## 2023-07-22 DIAGNOSIS — R0609 Other forms of dyspnea: Secondary | ICD-10-CM | POA: Diagnosis not present

## 2023-07-22 DIAGNOSIS — I2510 Atherosclerotic heart disease of native coronary artery without angina pectoris: Secondary | ICD-10-CM

## 2023-07-22 DIAGNOSIS — E118 Type 2 diabetes mellitus with unspecified complications: Secondary | ICD-10-CM

## 2023-07-22 DIAGNOSIS — Z01818 Encounter for other preprocedural examination: Secondary | ICD-10-CM | POA: Diagnosis not present

## 2023-07-22 DIAGNOSIS — E785 Hyperlipidemia, unspecified: Secondary | ICD-10-CM

## 2023-07-22 NOTE — Progress Notes (Signed)
Cardiology Office Note:  .   Date:  07/22/2023  ID:  Jessica Roberts, DOB 02/06/1946, MRN [id number] PCP: [name], MD  [facility] Providers Cardiologist:  Parke Poisson, MD    History of Present Illness: .   Jessica Roberts is a 77 y.o. female with history of CAD, hypertension, hyperlipidemia, OSA, dyspnea on exertion, GERD, hiatal hernia, iron deficiency anemia.  Prior echo 06/2020 LVEF 60 to 65%, mild LVH, no RWMA, grade 1 diastolic dysfunction.  Coronary CTA 06/2020 calcium score 105 placing her in the 60th percentile with FFR demonstrating significant stenosis in proximal to mid OM1 branch, tapering flow limitation to the LAD, small vessel toward the apex.  Clinical correlation with angina was recommended.  Also noted exertional dyspnea exacerbated by IDA.  Seen 10/19/2020 stable from cardiac standpoint with no symptoms concerning for angina.  Last seen 02/05/2022 doing overall well from a cardiac perspective-no changes were made and she was recommended to follow-up in 1 year.  She was not wearing CPAP and noted she would be uninterested in wearing, repeat sleep study deferred.  Presents today for preoperative clearance for hysterectomy D&C scheduled 07/25/2023. Discussed the use of AI scribe software for clinical note transcription with the patient, who gave verbal consent to proceed.    She reports overall stability from a cardiac perspective, with no symptoms concerning for angina.  The patient also discusses her struggle with depression and PTSD, which has significantly impacted her lifestyle. Follows routinely with psychiatry team. She reports a period of severe depression, during which she was largely sedentary and had poor dietary habits, including frequent consumption of potato chips and chocolate. This period of depression followed a diagnosis of COVID-19 and cessation of her regular water exercise routine. The patient reports that she is now  emerging from this depressive episode and is motivated to improve her health behaviors. Interested in [employer], health coaching, and meeting with diabetes educator after being diagnosed with diabetes.  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 She reports that this experience has influenced her approach to health, leading her to compartmentalize her cardiac issues and often only consider them during her annual check-ups. We reviewed her diagnosis of nonobstructive CAD extensively as well as secondary prevention.      ROS: Please see the history of present illness.    All other systems reviewed and are negative.   Studies Reviewed: Marland Kitchen   EKG Interpretation Date/Time:  Monday July 22 2023 14:51:24 EST Ventricular Rate:  68 PR Interval:  196 QRS Duration:  86 QT Interval:  422 QTC Calculation: 448 R Axis:   -14  Text Interpretation: Normal sinus rhythm Normal ECG When compared with ECG of 22-Apr-2015 15:24, No significant change was found Confirmed by Gillian Shields ([id number]) on 07/22/2023 2:59:40 PM    Cardiac Studies & Procedures       ECHOCARDIOGRAM  ECHOCARDIOGRAM COMPLETE 06/28/2020  Narrative ECHOCARDIOGRAM REPORT    Patient Name:   Jessica Roberts Date of Exam: 06/28/2020 Medical Rec #:  [id number]                 Height:       65.5 in Accession #:    [id number]                Weight:       182.4 lb Date of Birth:  03-Oct-1945  BSA:          1.913 m Patient Age:    73 years                  BP:           148/72 mmHg Patient Gender: F                         HR:           73 bpm. Exam Location:  [address]  Procedure: 2D Echo, Cardiac Doppler and Color Doppler  Indications:    R06.00 Dyspnea  History:        Patient has no prior history of Echocardiogram examinations. Signs/Symptoms:Dyspnea; Risk Factors:Family History of Coronary Artery Disease,  Hypertension, Former Smoker and Sleep Apnea. Right Breast Cancer with Lumpectomy and Radiation, Edema.  Sonographer:    Farrel Conners RDCS Referring Phys: [id number] Parke Poisson  IMPRESSIONS   1. Left ventricular ejection fraction, by estimation, is 60 to 65%. The left ventricle has normal function. The left ventricle has no regional wall motion abnormalities. There is mild concentric left ventricular hypertrophy. Left ventricular diastolic parameters are consistent with Grade I diastolic dysfunction (impaired relaxation). 2. Right ventricular systolic function is normal. The right ventricular size is normal. There is normal pulmonary artery systolic pressure. The estimated right ventricular systolic pressure is 26.8 mmHg. 3. The mitral valve is normal in structure. No evidence of mitral valve regurgitation. No evidence of mitral stenosis. 4. The aortic valve is normal in structure. Aortic valve regurgitation is not visualized. No aortic stenosis is present. 5. The inferior vena cava is normal in size with greater than 50% respiratory variability, suggesting right atrial pressure of 3 mmHg.  FINDINGS Left Ventricle: Left ventricular ejection fraction, by estimation, is 60 to 65%. The left ventricle has normal function. The left ventricle has no regional wall motion abnormalities. The left ventricular internal cavity size was normal in size. There is mild concentric left ventricular hypertrophy. Left ventricular diastolic parameters are consistent with Grade I diastolic dysfunction (impaired relaxation). Indeterminate filling pressures.  Right Ventricle: The right ventricular size is normal. No increase in right ventricular wall thickness. Right ventricular systolic function is normal. There is normal pulmonary artery systolic pressure. The tricuspid regurgitant velocity is 2.44 m/s, and with an assumed right atrial pressure of 3 mmHg, the estimated right ventricular systolic pressure is  26.8 mmHg.  Left Atrium: Left atrial size was normal in size.  Right Atrium: Right atrial size was normal in size.  Pericardium: There is no evidence of pericardial effusion.  Mitral Valve: The mitral valve is normal in structure. No evidence of mitral valve regurgitation. No evidence of mitral valve stenosis.  Tricuspid Valve: The tricuspid valve is normal in structure. Tricuspid valve regurgitation is not demonstrated. No evidence of tricuspid stenosis.  Aortic Valve: The aortic valve is normal in structure. Aortic valve regurgitation is not visualized. No aortic stenosis is present.  Pulmonic Valve: The pulmonic valve was normal in structure. Pulmonic valve regurgitation is not visualized. No evidence of pulmonic stenosis.  Aorta: The aortic root is normal in size and structure.  Venous: The inferior vena cava is normal in size with greater than 50% respiratory variability, suggesting right atrial pressure of 3 mmHg.  IAS/Shunts: No atrial level shunt detected by color flow Doppler.   LEFT VENTRICLE PLAX 2D LVIDd:         3.85 cm  Diastology LVIDs:  1.85 cm  LV e' medial:    5.87 cm/s LV PW:         1.25 cm  LV E/e' medial:  11.8 LV IVS:        1.25 cm  LV e' lateral:   7.77 cm/s LVOT diam:     2.00 cm  LV E/e' lateral: 8.9 LV SV:         72 LV SV Index:   38 LVOT Area:     3.14 cm   RIGHT VENTRICLE RV S prime:     15.50 cm/s TAPSE (M-mode): 2.2 cm  LEFT ATRIUM             Index       RIGHT ATRIUM           Index LA diam:        3.00 cm 1.57 cm/m  RA Area:     16.30 cm LA Vol (A2C):   28.6 ml 14.95 ml/m RA Volume:   45.20 ml  23.63 ml/m LA Vol (A4C):   56.2 ml 29.38 ml/m LA Biplane Vol: 41.7 ml 21.80 ml/m AORTIC VALVE LVOT Vmax:   98.90 cm/s LVOT Vmean:  61.100 cm/s LVOT VTI:    0.229 m  AORTA Ao Root diam: 3.30 cm Ao Asc diam:  3.40 cm  MITRAL VALVE               TRICUSPID VALVE MV Area (PHT): cm         TR Peak grad:   23.8 mmHg MV Decel  Time: 210 msec    TR Vmax:        244.00 cm/s MV E velocity: 69.40 cm/s MV A velocity: 97.30 cm/s  SHUNTS MV E/A ratio:  0.71        Systemic VTI:  0.23 m Systemic Diam: 2.00 cm  Rachelle Hora Croitoru MD Electronically signed by Thurmon Fair MD Signature Date/Time: 06/28/2020/4:08:44 PM    Final     CT SCANS  CT CORONARY MORPH W/CTA COR W/SCORE 07/05/2020  Addendum 07/05/2020 10:14 PM ADDENDUM REPORT: 07/05/2020 22:12  HISTORY: 77 yo female with chest pain/anginal equiv, 37yr CHD risk 10-20%, treadmill candidate Dyspnea on exertion (DOE)  EXAM: Cardiac/Coronary CTA  TECHNIQUE: The patient was scanned on a [employer].  PROTOCOL: A 120 kV prospective scan was triggered in the descending thoracic aorta at 111 HU's. Axial non-contrast 3 mm slices were carried out through the heart. The data set was analyzed on a dedicated work station and scored using the Agatson method. Gantry rotation speed was 250 msecs and collimation was .6 mm. Beta blockade and 0.8 mg of sl NTG was given. The 3D data set was reconstructed in 5% intervals of the 67-82 % of the R-R cycle. Diastolic phases were analyzed on a dedicated work station using MPR, MIP and VRT modes. The patient received 80mL OMNIPAQUE IOHEXOL 350 MG/ML SOLN of contrast.  FINDINGS: Quality: Good, HR 56  Coronary calcium score: The patient's coronary artery calcium score is 105, which places the patient in the 68th percentile.  Coronary arteries: Normal coronary origins.  Right dominance.  Right Coronary Artery: Dominant. Minimal mixed 1-24% mid-vessel stenosis at the crux. Normal R-PDA and R-PLB branches.  Left Main Coronary Artery: Normal. Bifurcates into the LAD and LCx arteries.  Left Anterior Descending Coronary Artery: There is mild mixed proximal 25-49% stenosis (CADRADS2). Moderate sized D1 branch with minimal non-calcified proximal stenosis (CADRADS1).  Left Circumflex Artery: AV groove vessel with  minimal mixed proximal stenosis 1-24% (CADRADS1). Tortuous mid-vessel OM1 branch with possibly severe non-calcified proximal stenosis versus artifact.  Aorta: Normal size, 30 mm at the mid ascending aorta (level of the PA bifurcation) measured double oblique. Aortic atherosclerosis. No dissection.  Aortic Valve: Trileaflet.  No calcifications.  Other findings:  Normal pulmonary vein drainage into the left atrium.  Normal left atrial appendage without a thrombus.  Normal size of the pulmonary artery.  IMPRESSION: 1. Mild non-obstructive mixed CAD, primarily of the proximal LAD, CADRADS = 2.  2 Non-calcified filling defect of the proximal portion of a tortuous OM1 branch, could represent severe stenosis or possible artifact. Will send for FFR.  3. Coronary calcium score of 105. This was 68th percentile for age and sex matched control.  4. Normal coronary origin with right dominance.  5. Aortic atherosclerosis.  6. Large hiatal hernia (see radiology over-read)   Electronically Signed By: Chrystie Nose M.D. On: 07/05/2020 22:12  Narrative EXAM: OVER-READ INTERPRETATION  CT CHEST  The following report is an over-read performed by radiologist Dr. Charlett Nose of [facility] on 07/05/2020. This over-read does not include interpretation of cardiac or coronary anatomy or pathology. The coronary CTA interpretation by the cardiologist is attached.  COMPARISON:  None.  FINDINGS: Vascular: Heart is normal size. Aorta normal caliber. Scattered calcifications in the descending thoracic aorta.  Mediastinum/Nodes: No adenopathy.  Large hiatal hernia.  Lungs/Pleura: No confluent opacities or effusions.  Upper Abdomen: Imaging into the upper abdomen demonstrates no acute findings.  Musculoskeletal: Chest wall soft tissues are unremarkable. No acute bony abnormality.  IMPRESSION: Large hiatal hernia.  Descending aortic scattered atherosclerosis.  No  acute findings.  Electronically Signed: By: Charlett Nose M.D. On: 07/05/2020 13:32          Risk Assessment/Calculations:             Physical Exam:   VS:  BP 124/82   Pulse 68   Ht 5\' 6"  (1.676 m)   Wt 187 lb 14.4 oz (85.2 kg)   SpO2 98%   BMI 30.33 kg/m    Wt Readings from Last 3 Encounters:  07/22/23 187 lb 14.4 oz (85.2 kg)  07/01/23 189 lb 9.6 oz (86 kg)  06/21/22 183 lb (83 kg)    GEN: Well nourished, well developed in no acute distress NECK: No JVD; No carotid bruits CARDIAC: RRR, no murmurs, rubs, gallops RESPIRATORY:  Clear to auscultation without rales, wheezing or rhonchi  ABDOMEN: Soft, non-tender, non-distended EXTREMITIES:  No edema; No deformity   ASSESSMENT AND PLAN: .       Coronary Artery Disease Stable with no symptoms concerning for angina. Coronary CTA from 06/2020 showed significant stenosis in proximal OM1 branch and tapering flow limitation to the LAD. As asymptomatic, no indication for intervention. Discussed the importance of cholesterol control and lifestyle modifications. -Continue Rosuvastatin. No ASA due to IDA.  -Will request cholesterol results from primary care physician. -Consider repeating cholesterol labs in 4-6 months if not at goal as plans on lifestyle changes.  Preoperative Clearance for Hysterectomy and DNC According to the Revised Cardiac Risk Index (RCRI), her Perioperative Risk of Major Cardiac Event is (%): 0.9.  Her Functional Capacity in METs is: 5.07 according to the Duke Activity Status Index (DASI).  -Per AHA/ACC guidelines, she is deemed acceptable risk for the planned procedure without additional cardiovascular testing. Will route to surgical team so they are aware.   -No need to hold any medications before procedure.  Obstructive Sleep Apnea Patient not using CPAP and previously uninterested in repeat sleep study. -No changes recommended at this time.  Diabetes Patient previously on Ozempic and lost weight, but  stopped due to depression. Plans to restart Ozempic in the new year. -Refer to diabetes educator for dietary guidance. -Refer to Physician Referred Exercise Program (PREP).  Depression and PTSD Recently changed medications and reports improvement in depressive symptoms. -Continue current mental health management with psychiatry team.   Follow-up in 1 year or sooner if any issues arise.              Signed, Alver Sorrow, NP

## 2023-07-22 NOTE — Patient Instructions (Addendum)
Medication Instructions:  Continue your current medication  *If you need a refill on your cardiac medications before your next appointment, please call your pharmacy*  Follow-Up: At [facility], you and your health needs are our priority.  As part of our continuing mission to provide you with exceptional heart care, we have created designated Provider Care Teams.  These Care Teams include your primary Cardiologist (physician) and Advanced Practice Providers (APPs -  Physician Assistants and Nurse Practitioners) who all work together to provide you with the care you need, when you need it.  We recommend signing up for the patient portal called "MyChart".  Sign up information is provided on this After Visit Summary.  MyChart is used to connect with patients for Virtual Visits (Telemedicine).  Patients are able to view lab/test results, encounter notes, upcoming appointments, etc.  Non-urgent messages can be sent to your provider as well.   To learn more about what you can do with MyChart, go to [email or web address].    Your next appointment:   1 year(s)  Provider:   Parke Poisson, MD  or Alver Sorrow, NP    Other Instructions   For coronary artery disease often called "heart disease" we aim for optimal guideline directed medical therapy. We use the "A, B, C"s to help keep [place] on track!  A = Aspirin - we skip this for you due to your other medical history. B = Blood pressure goal to goal less than 130/80.  C = Cholesterol control. You take Rosuvastatin to help control your cholesterol. We would like your LDL (bad cholesterol) to be less than 70.  D = Diet - choosing a heart healthy diet.  E = Exercise - gradually increasing to 150 minutes per week.

## 2023-07-24 ENCOUNTER — Telehealth: Payer: Self-pay

## 2023-07-24 NOTE — Telephone Encounter (Signed)
Called to discuss PREP program referral; She is in line at [employer] [facility], wants a call back. Will return call later today

## 2023-07-24 NOTE — Telephone Encounter (Signed)
Returned her call, explained PREP, she would like to attend at [facility] on January 14, every T/Th 2-3:15; will contact first of January to confirm and set up assessment visit.

## 2023-08-01 ENCOUNTER — Telehealth: Payer: Self-pay

## 2023-08-01 DIAGNOSIS — Z0000 Encounter for general adult medical examination without abnormal findings: Secondary | ICD-10-CM

## 2023-08-01 NOTE — Telephone Encounter (Signed)
Called patient per health coaching referral from Gillian Shields, NP regarding patient's interest in participating in health coaching to improve health behaviors. Patient did not answer. Left voicemail with contact information for patient to return call.   [name], MS, ERHD, [facility]  Care Guide, Health & Wellness Coach [address] [place] [address] Telephone: [phone] Email: [email or web address]

## 2023-08-03 DIAGNOSIS — S0502XA Injury of conjunctiva and corneal abrasion without foreign body, left eye, initial encounter: Secondary | ICD-10-CM | POA: Diagnosis not present

## 2023-08-03 DIAGNOSIS — X58XXXA Exposure to other specified factors, initial encounter: Secondary | ICD-10-CM | POA: Diagnosis not present

## 2023-08-08 ENCOUNTER — Telehealth: Payer: Self-pay

## 2023-08-08 DIAGNOSIS — Z0000 Encounter for general adult medical examination without abnormal findings: Secondary | ICD-10-CM

## 2023-08-08 NOTE — Telephone Encounter (Signed)
Returned patient's call to schedule an initial health coaching appointment per referral from Gillian Shields, NP. Patient is interesting in improving healthy eating habits. Patient has been scheduled for her initial appointment in-person on 12/30 at 11:00am.   [name], MS, ERHD, [facility]  Care Guide, Health & Wellness Coach [address], [address] [place] [address] Telephone: [phone] Email: [email or web address]

## 2023-08-12 ENCOUNTER — Ambulatory Visit: Payer: Medicare Other

## 2023-08-12 ENCOUNTER — Telehealth: Payer: Self-pay

## 2023-08-12 DIAGNOSIS — Z0000 Encounter for general adult medical examination without abnormal findings: Secondary | ICD-10-CM

## 2023-08-12 NOTE — Telephone Encounter (Signed)
Called patient to determine if she was in route to health coaching appointment. Patient stated that she overslept and is out of sorts. Patient requested to be rescheduled. Patient has been rescheduled for 12/31 at 2:00pm for an in-person health coaching appointment.   [name], MS, ERHD, [facility]  Care Guide, Health & Wellness Coach [address], [address] [place] [address] Telephone: [phone] Email: [email or web address]

## 2023-08-13 ENCOUNTER — Telehealth: Payer: Self-pay | Admitting: Internal Medicine

## 2023-08-13 ENCOUNTER — Ambulatory Visit: Payer: Medicare Other

## 2023-08-13 NOTE — Telephone Encounter (Signed)
Patient canceled Nurse Visit today as she has COVID and stated she will call back to reschedule.

## 2023-08-19 ENCOUNTER — Telehealth: Payer: Self-pay

## 2023-08-19 NOTE — Telephone Encounter (Signed)
 Called to confirm participation in next PREP class at [facility], assessment visit scheduled for 1/13 at 11:30

## 2023-08-21 ENCOUNTER — Other Ambulatory Visit: Payer: Self-pay | Admitting: Obstetrics and Gynecology

## 2023-08-22 LAB — SURGICAL PATHOLOGY

## 2023-08-26 NOTE — Progress Notes (Signed)
 [facility] PREP Evaluation  Patient Details  Name: Jessica Roberts MRN: [id number] Date of Birth: 12/24/1945 Age: 78 y.o. PCP: Windy Coy, MD  Vitals:   08/26/23 1204  BP: 112/66  Pulse: 94  SpO2: 95%  Weight: 186 lb (84.4 kg)     [facility] Eval - 08/26/23 1200       [facility] PREP Location   [facility] PREP Location [facility]      Referral    Referring Provider Walker    Reason for referral Hypertension;Inactivity    Program Start Date 08/27/23      Measurement   Waist Circumference 44 inches    Hip Circumference 46.5 inches    Body fat 45 percent      Information for Trainer   Goals --   Establish cardio and strength training exercise routine; improve mental and physical health   Current Exercise none    Orthopedic Concerns --   Knees   Pertinent Medical History --   R Breast CA; CAD, HTN, OSA, hyperlipidemia     Timed Up and Go (TUGS)   Timed Up and Go Low risk <9 seconds      Mobility and Daily Activities   I find it easy to walk up or down two or more flights of stairs. 1    I have no trouble taking out the trash. 2    I do housework such as vacuuming and dusting on my own without difficulty. 2    I can easily lift a gallon of milk (8lbs). 2    I can easily walk a mile. 1    I have no trouble reaching into high cupboards or reaching down to pick up something from the floor. 2    I do not have trouble doing out-door work such as loss adjuster, chartered, raking leaves, or gardening. 2      Mobility and Daily Activities   I feel younger than my age. 1    I feel independent. 3    I feel energetic. 3    I live an active life.  2    I feel strong. 1    I feel healthy. 3    I feel active as other people my age. 1      How fit and strong are you.   Fit and Strong Total Score 26            Past Medical History:  Diagnosis Date   Allergy    Anxiety    Anxiety and depression    Breast cancer (HCC) 04/07/15   right breast   Breast cancer of upper-outer  quadrant of right female breast (HCC) 04/11/2015   Broken nose    Complication of anesthesia    had a very hard time waking up   Dengue fever    Depression    Fall    broken nose   GERD (gastroesophageal reflux disease)    Head injury 1995   concussion in [place], med evac to [place] , ear damage with vertigo   Head injury, acute, with loss of consciousness (HCC) 2000   [place] the country   Head injury, closed, with concussion 1986   [place]   Headache    Hypertension    Malaria    PONV (postoperative nausea and vomiting)    Typhoid    Past Surgical History:  Procedure Laterality Date   BREAST LUMPECTOMY Right 04/27/2015   BREAST LUMPECTOMY WITH NEEDLE LOCALIZATION AND AXILLARY  SENTINEL LYMPH NODE BX Right 04/27/2015   Procedure: RIGHT BREAST LUMPECTOMY WITH  TWO NEEDLE LOCALIZATION AND TWO RIGHT AXILLARY SENTINEL LYMPH NODE BX;  Surgeon: Elon Pacini, MD;  Location: [facility] OR;  Service: General;  Laterality: Right;   COLONOSCOPY W/ BIOPSIES AND POLYPECTOMY     NERVE SURGERY     '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' during surgery for endometriosis   surgical procedure for endometriosis     TONSILLECTOMY     Social History   Tobacco Use  Smoking Status Former   Current packs/day: 0.00   Average packs/day: 1.5 packs/day for 30.0 years (45.0 ttl pk-yrs)   Types: Cigarettes   Start date: 01/12/1964   Quit date: 01/11/1994   Years since quitting: 29.6  Smokeless Tobacco Never  To begin PREP class at [employer] [facility] 1/14, every T/Th 2-3:15  [name] 08/26/2023, 12:07 PM

## 2023-08-27 ENCOUNTER — Telehealth (HOSPITAL_BASED_OUTPATIENT_CLINIC_OR_DEPARTMENT_OTHER): Payer: Self-pay

## 2023-08-27 DIAGNOSIS — Z0000 Encounter for general adult medical examination without abnormal findings: Secondary | ICD-10-CM

## 2023-08-27 NOTE — Progress Notes (Signed)
 [facility] PREP Weekly Session  Patient Details  Name: Jessica Roberts MRN: [id number] Date of Birth: 1945/11/25 Age: 78 y.o. PCP: Windy Coy, MD  There were no vitals filed for this visit.   [facility] Weekly seesion - 08/27/23 1500       [facility] PREP Location   [facility] PREP Location [facility]      Weekly Session   Topic Discussed Goal setting and welcome to the program   Introductions, reivew of notebook, tour of facility; option to work out on cardio machine   Classes attended to date 1             Jessica Roberts 08/27/2023, 3:53 PM

## 2023-08-27 NOTE — Telephone Encounter (Signed)
 Called patient to reschedule health coaching appointment that she cancelled due to having Covid. Patient is interested in having an in-person visit on 08/28/23 at 10:30am. Patient has been scheduled accordingly.   Greig Ruth, MS, ERHD, [facility]  Care Guide, Health & Wellness Coach [address] [place] [address] Telephone: [phone] Email: [email or web address]

## 2023-08-28 ENCOUNTER — Ambulatory Visit: Payer: Medicare Other

## 2023-08-28 ENCOUNTER — Telehealth: Payer: Self-pay | Admitting: Licensed Clinical Social Worker

## 2023-08-28 NOTE — Telephone Encounter (Signed)
 CSW contacted patient to inform that health coach Amy Merlyn Starring is out of the office unexpectedly and appointment scheduled for today has been cancelled. Patient appreciative of the call. Aubry Blase, LCSW, CCSW-MCS [phone]

## 2023-09-03 ENCOUNTER — Encounter: Payer: Medicare Other | Attending: Family Medicine | Admitting: Dietician

## 2023-09-03 ENCOUNTER — Encounter: Payer: Self-pay | Admitting: Dietician

## 2023-09-03 DIAGNOSIS — E1169 Type 2 diabetes mellitus with other specified complication: Secondary | ICD-10-CM | POA: Diagnosis present

## 2023-09-03 NOTE — Progress Notes (Signed)
Patient was seen on 09/03/23 for the first of a series of three diabetes self-management courses at the [facility].  Patient Education Plan per assessed needs and concerns is to attend three course education program for Diabetes Self Management Education.  Blood glucose was 312 on 07/01/23  The following learning objectives were met by the patient during this class: Describe diabetes, types of diabetes and pathophysiology State some common risk factors for diabetes Defines the role of glucose and insulin Describe the relationship between diabetes and cardiovascular and other risks State the members of the Healthcare Team States the rationale for glucose monitoring and when to test State their individual Target Range State the importance of logging glucose readings and how to interpret the readings Identifies A1C target Explain the correlation between A1c and eAG values State symptoms and treatment of high blood glucose and low blood glucose Explain proper technique for glucose testing and identify proper sharps disposal  Handouts given during class include: How to Thrive:  A Guide for Your Journey with Diabetes by the ADA Meal Plan Card and carbohydrate content list Dietary intake form Low Sodium Flavoring Tips Types of Fats Dining Out Label reading Snack list The diabetes portion plate Diabetes Resources A1c to eAG Conversion Chart Blood Glucose Log Diabetes Recommended Care Schedule Support Group Diabetes Success Plan Core Class Satisfaction Survey   Follow-Up Plan: Attend core 2

## 2023-09-03 NOTE — Progress Notes (Signed)
[facility] PREP Weekly Session  Patient Details  Name: Jessica Roberts MRN: [id number] Date of Birth: 12/17/1945 Age: 78 y.o. PCP: [name], MD  Vitals:   09/03/23 1538  Weight: 184 lb 12.8 oz (83.8 kg)     [facility] Weekly seesion - 09/03/23 1500       [facility] "PREP" Location   [facility] "PREP[facility] Location [facility]      Weekly Session   Topic Discussed Importance of resistance training;Other ways to be active   Goal: work up to 150 minutes of cardio/wk; strength training 2-3 times a week for 20-40 minutes   Minutes exercised this week 50 minutes    Classes attended to date 3             Juliani Laduke B Levaughn Puccinelli 09/03/2023, 3:40 PM

## 2023-09-10 ENCOUNTER — Ambulatory Visit: Payer: Medicare Other | Admitting: Dietician

## 2023-09-11 ENCOUNTER — Telehealth: Payer: Self-pay

## 2023-09-11 DIAGNOSIS — Z0000 Encounter for general adult medical examination without abnormal findings: Secondary | ICD-10-CM

## 2023-09-11 NOTE — Telephone Encounter (Signed)
Called patient to reschedule cancelled health coaching appt due to me being out of office on 08/28/23. Patient expressed that she was recovering from not feeling well. Patient is opened to a telephonic health coaching appt since she is not 100% well. Patient has been scheduled for 1/31 at 3pm. Patient will be called at that time.   [name], MS, ERHD, [facility]  Care Guide, Health & Wellness Coach [address] [place] [address] Telephone: [phone] Email: [email or web address]

## 2023-09-13 ENCOUNTER — Telehealth: Payer: Self-pay

## 2023-09-13 ENCOUNTER — Ambulatory Visit: Payer: Medicare Other

## 2023-09-13 DIAGNOSIS — Z0000 Encounter for general adult medical examination without abnormal findings: Secondary | ICD-10-CM

## 2023-09-13 NOTE — Telephone Encounter (Signed)
Called patient per schedule health coaching appointment. Patient is still sick and unable to talk without having bouts with coughing. Patient was provided my contact information so that she could reach out when she was better to schedule an in-person health coaching appointment.   [name], MS, ERHD, [facility]  Care Guide, Health & Wellness Coach [address] [place] [address] Telephone: [phone] Email: [email or web address]

## 2023-09-17 ENCOUNTER — Ambulatory Visit: Payer: Medicare Other

## 2023-09-17 NOTE — Progress Notes (Signed)
 [facility] PRE[facility] Weekly Session  Patient Details  Name: Jessica Roberts MRN: [id number] Date of Birth: [birth year] Age: 78 y.o. PCP: Windy Coy, MD  There were no vitals filed for this visit.   [facility] Weekly seesion - 09/17/23 1500       [facility] PREP Location   [facility] PREP Location [facility]      Weekly Session   Topic Discussed Health habits   Sugar demo; limit added sugars for 24 gm for women and 36 gms for men; stay hydrated---1/2 body in oz of water/day   Classes attended to date 5             Marionna Gonia B Matas Burrows 09/17/2023, 3:21 PM

## 2023-09-24 NOTE — Progress Notes (Signed)
[facility] PREP Weekly Session  Patient Details  Name: Jessica Roberts MRN: [id number] Date of Birth: 03/06/1946 Age: 78 y.o. PCP: [name], MD  Vitals:   09/24/23 1511  Weight: 180 lb (81.6 kg)     [facility] Weekly seesion - 09/24/23 1500       [facility] "PREP" Location   [facility]PREP[facility] Location [facility]      Weekly Session   Topic Discussed Restaurant Eating   Limit salt intake to 1500-2300mg /day; salt demo   Minutes exercised this week 30 minutes    Classes attended to date 8             Vernor Monnig B Mava Suares 09/24/2023, 3:13 PM

## 2023-10-01 NOTE — Progress Notes (Signed)
 [facility] PRE[facility] Weekly Session  Patient Details  Name: Jessica Roberts MRN: [id number] Date of Birth: 18-May-1946 Age: 78 y.o. PCP: [name], MD  There were no vitals filed for this visit.   [facility] Weekly seesion - 10/01/23 1500       [facility] "PREP" Location   [facility] "PREP[facility] Location [facility]      Weekly Session   Topic Discussed Stress management and problem solving   importance of sleep; finger tip mudra breathwork practice; shared electronic version of sleep guidlines   Classes attended to date 30             Jessica Roberts 10/01/2023, 3:38 PM

## 2023-10-03 ENCOUNTER — Ambulatory Visit: Payer: Medicare Other

## 2023-10-10 ENCOUNTER — Ambulatory Visit: Payer: Medicare Other

## 2023-10-15 NOTE — Progress Notes (Signed)
 [facility] PRE[facility] Weekly Session  Patient Details  Name: Jessica Roberts MRN: [id number] Date of Birth: 1946-07-31 Age: 78 y.o. PCP: [name], MD  Vitals:   10/15/23 1521  Weight: 185 lb (83.9 kg)     [facility] Weekly seesion - 10/15/23 1500       [facility] "PREP" Location   [facility]PREP[facility] Location [facility]      Weekly Session   Topic Discussed Eating for the season;Water    Classes attended to date 92             Sonia Baller 10/15/2023, 3:25 PM

## 2023-10-22 ENCOUNTER — Encounter: Payer: Medicare Other | Attending: Family | Admitting: Skilled Nursing Facility1

## 2023-10-22 ENCOUNTER — Telehealth: Payer: Self-pay

## 2023-10-22 DIAGNOSIS — E119 Type 2 diabetes mellitus without complications: Secondary | ICD-10-CM | POA: Diagnosis present

## 2023-10-22 DIAGNOSIS — Z0000 Encounter for general adult medical examination without abnormal findings: Secondary | ICD-10-CM

## 2023-10-22 NOTE — Progress Notes (Signed)
 [facility] PRE[facility] Weekly Session  Patient Details  Name: Teretha Chalupa MRN: [id number] Date of Birth: 28-May-1946 Age: 78 y.o. PCP: [name], MD  Vitals:   10/22/23 1528  Weight: 187 lb (84.8 kg)     [facility] Weekly seesion - 10/22/23 1500       [facility] "PREP" Location   [facility] "PREP[facility] Location [facility]      Weekly Session   Topic Discussed Other   Portion control, visualize your portion size demo; review of [employer] food label.   Minutes exercised this week 20 minutes    Classes attended to date 23             Razi Hickle B Noga Fogg 10/22/2023, 3:29 PM

## 2023-10-22 NOTE — Telephone Encounter (Signed)
 Called patient to determine if she was interested in resuming health coaching and she hung up the phone. Patient has been removed from the program.   [name], MS, ERHD, [facility]  Care Guide, Health & Wellness Coach [address] [place] [address] Telephone: [phone] Email: [email or web address]

## 2023-10-23 ENCOUNTER — Encounter: Payer: Self-pay | Admitting: Skilled Nursing Facility1

## 2023-10-23 NOTE — Progress Notes (Signed)

## 2023-10-29 ENCOUNTER — Encounter: Payer: Medicare Other | Attending: Family | Admitting: Skilled Nursing Facility1

## 2023-10-29 DIAGNOSIS — E119 Type 2 diabetes mellitus without complications: Secondary | ICD-10-CM | POA: Insufficient documentation

## 2023-10-30 ENCOUNTER — Encounter: Payer: Self-pay | Admitting: Skilled Nursing Facility1

## 2023-10-30 NOTE — Progress Notes (Signed)
 Patient was seen on 10/30/2023 for the third of a series of three diabetes self-management courses at the [facility]. The following learning objectives were met by the patient during this class:  State the amount of activity recommended for healthy living Describe activities suitable for individual needs Identify ways to regularly incorporate activity into daily life Identify barriers to activity and ways to over come these barriers Identify diabetes medications being personally used and their primary action for lowering glucose and possible side effects Describe role of stress on blood glucose and develop strategies to address psychosocial issues Identify diabetes complications and ways to prevent them Explain how to manage diabetes during illness Evaluate success in meeting personal goal Establish 2-3 goals that they will plan to diligently work on until they return for the  3-month follow-up visit  Goals:  I will avoid late night snacking I will go for walks  Your patient has identified these potential barriers to change:  Motivation   Your patient has identified their diabetes self-care support plan as   Family Support  Plan:  Attend Monthly Diabetes Support Group as needed or make a future follow up appointment

## 2023-11-07 ENCOUNTER — Ambulatory Visit: Payer: Medicare Other

## 2023-11-18 NOTE — Progress Notes (Signed)
 [facility] PRE[facility] Evaluation  Patient Details  Name: Jessica Roberts MRN: [id number] Date of Birth: 1945/10/20 Age: 78 y.o. PCP: [name], MD  Vitals:   11/18/23 1141  BP: 108/70  Pulse: 85  SpO2: 95%  Weight: 184 lb 6.4 oz (83.6 kg)     [facility] Eval - 11/18/23 1100       [facility] "PREP" Location   [facility] "PREP[facility] Location [facility]      Referral    Program Start Date 08/27/23    Program End Date 11/14/23      Measurement   Waist Circumference 44 inches    Waist Circumference End Program 43 inches    Hip Circumference 46.5 inches    Hip Circumference End Program 46 inches    Body fat 44.7 percent      Mobility and Daily Activities   I find it easy to walk up or down two or more flights of stairs. 1    I have no trouble taking out the trash. 4    I do housework such as vacuuming and dusting on my own without difficulty. 3    I can easily lift a gallon of milk (8lbs). 2    I can easily walk a mile. 2    I have no trouble reaching into high cupboards or reaching down to pick up something from the floor. 3    I do not have trouble doing out-door work such as Loss adjuster, chartered, raking leaves, or gardening. 2      Mobility and Daily Activities   I feel younger than my age. 2    I feel independent. 3    I feel energetic. 2    I live an active life.  3    I feel strong. 1    I feel healthy. 1    I feel active as other people my age. 3      How fit and strong are you.   Fit and Strong Total Score 32            Past Medical History:  Diagnosis Date   Allergy    Anxiety    Anxiety and depression    Breast cancer (HCC) 04/07/15   right breast   Breast cancer of upper-outer quadrant of right female breast (HCC) 04/11/2015   Broken nose    Complication of anesthesia    "had a very hard time waking up"   Dengue fever    Depression    Fall    broken nose   GERD (gastroesophageal reflux disease)    Head injury 1995   concussion in [place], med evac to  [place], ear damage with vertigo   Head injury, acute, with loss of consciousness (HCC) 2000   [place] of the country   Head injury, closed, with concussion 1986   [place]   Headache    Hypertension    Malaria    PONV (postoperative nausea and vomiting)    Typhoid    Past Surgical History:  Procedure Laterality Date   BREAST LUMPECTOMY Right 04/27/2015   BREAST LUMPECTOMY WITH NEEDLE LOCALIZATION AND AXILLARY SENTINEL LYMPH NODE BX Right 04/27/2015   Procedure: RIGHT BREAST LUMPECTOMY WITH  TWO NEEDLE LOCALIZATION AND TWO RIGHT AXILLARY SENTINEL LYMPH NODE BX;  Surgeon: Claud Kelp, MD;  Location: [facility] OR;  Service: General;  Laterality: Right;   COLONOSCOPY W/ BIOPSIES AND POLYPECTOMY     NERVE SURGERY     'zapped nerves in  spinal column' during surgery for endometriosis   surgical procedure for endometriosis     TONSILLECTOMY     Social History   Tobacco Use  Smoking Status Former   Current packs/day: 0.00   Average packs/day: 1.5 packs/day for 30.0 years (45.0 ttl pk-yrs)   Types: Cigarettes   Start date: 01/12/1964   Quit date: 01/11/1994   Years since quitting: 29.8  Smokeless Tobacco Never  Wt loss: 1.6 pounds Inches lost 1.5 How fit and strong survey: 08/26/23: 26 11/18/23: 32 Arrived today to complete final assessment visit as she missed last 3 weeks of class. Education sessions complete: 7 Strength training sessions completed: 6    Liyla Radliff B Adelaine Roppolo 11/18/2023, 11:43 AM

## 2023-11-30 ENCOUNTER — Emergency Department (HOSPITAL_COMMUNITY)

## 2023-11-30 ENCOUNTER — Encounter (HOSPITAL_COMMUNITY): Payer: Self-pay

## 2023-11-30 ENCOUNTER — Emergency Department (HOSPITAL_COMMUNITY)
Admission: EM | Admit: 2023-11-30 | Discharge: 2023-11-30 | Disposition: A | Discharge status: Home / Self Care | Attending: Emergency Medicine | Admitting: Emergency Medicine

## 2023-11-30 ENCOUNTER — Other Ambulatory Visit: Payer: Self-pay

## 2023-11-30 DIAGNOSIS — Z79899 Other long term (current) drug therapy: Secondary | ICD-10-CM | POA: Insufficient documentation

## 2023-11-30 DIAGNOSIS — R531 Weakness: Secondary | ICD-10-CM

## 2023-11-30 DIAGNOSIS — Z853 Personal history of malignant neoplasm of breast: Secondary | ICD-10-CM | POA: Diagnosis not present

## 2023-11-30 DIAGNOSIS — I10 Essential (primary) hypertension: Secondary | ICD-10-CM | POA: Insufficient documentation

## 2023-11-30 DIAGNOSIS — R110 Nausea: Secondary | ICD-10-CM | POA: Diagnosis not present

## 2023-11-30 DIAGNOSIS — D352 Benign neoplasm of pituitary gland: Secondary | ICD-10-CM | POA: Diagnosis not present

## 2023-11-30 DIAGNOSIS — R4701 Aphasia: Secondary | ICD-10-CM

## 2023-11-30 DIAGNOSIS — R109 Unspecified abdominal pain: Secondary | ICD-10-CM | POA: Diagnosis not present

## 2023-11-30 DIAGNOSIS — R42 Dizziness and giddiness: Secondary | ICD-10-CM | POA: Insufficient documentation

## 2023-11-30 DIAGNOSIS — L299 Pruritus, unspecified: Secondary | ICD-10-CM | POA: Diagnosis present

## 2023-11-30 LAB — DIFFERENTIAL
Abs Immature Granulocytes: 0.04 10*3/uL (ref 0.00–0.07)
Basophils Absolute: 0.1 10*3/uL (ref 0.0–0.1)
Basophils Relative: 1 %
Eosinophils Absolute: 0.3 10*3/uL (ref 0.0–0.5)
Eosinophils Relative: 3 %
Immature Granulocytes: 0 %
Lymphocytes Relative: 24 %
Lymphs Abs: 2.2 10*3/uL (ref 0.7–4.0)
Monocytes Absolute: 0.7 10*3/uL (ref 0.1–1.0)
Monocytes Relative: 7 %
Neutro Abs: 5.8 10*3/uL (ref 1.7–7.7)
Neutrophils Relative %: 65 %

## 2023-11-30 LAB — TROPONIN I (HIGH SENSITIVITY)
Troponin I (High Sensitivity): 6 ng/L (ref <18)
Troponin I (High Sensitivity): 7 ng/L (ref <18)

## 2023-11-30 LAB — COMPREHENSIVE METABOLIC PANEL WITH GFR
ALT: 21 U/L (ref 0–44)
AST: 22 U/L (ref 15–41)
Albumin: 3.5 g/dL (ref 3.5–5.0)
Alkaline Phosphatase: 75 U/L (ref 38–126)
Anion gap: 11 (ref 5–15)
BUN: 14 mg/dL (ref 8–23)
CO2: 23 mmol/L (ref 22–32)
Calcium: 9.5 mg/dL (ref 8.9–10.3)
Chloride: 103 mmol/L (ref 98–111)
Creatinine, Ser: 0.7 mg/dL (ref 0.44–1.00)
GFR, Estimated: >60 mL/min (ref >60)
Glucose, Bld: 187 mg/dL — ABNORMAL HIGH (ref 70–99)
Potassium: 4 mmol/L (ref 3.5–5.1)
Sodium: 137 mmol/L (ref 135–145)
Total Bilirubin: 0.6 mg/dL (ref 0.0–1.2)
Total Protein: 7.2 g/dL (ref 6.5–8.1)

## 2023-11-30 LAB — I-STAT CHEM 8, ED
BUN: 14 mg/dL (ref 8–23)
Calcium, Ion: 1.14 mmol/L — ABNORMAL LOW (ref 1.15–1.40)
Chloride: 103 mmol/L (ref 98–111)
Creatinine, Ser: 0.6 mg/dL (ref 0.44–1.00)
Glucose, Bld: 183 mg/dL — ABNORMAL HIGH (ref 70–99)
HCT: 37 % (ref 36.0–46.0)
Hemoglobin: 12.6 g/dL (ref 12.0–15.0)
Potassium: 4 mmol/L (ref 3.5–5.1)
Sodium: 136 mmol/L (ref 135–145)
TCO2: 23 mmol/L (ref 22–32)

## 2023-11-30 LAB — URINALYSIS, W/ REFLEX TO CULTURE (INFECTION SUSPECTED)
Bilirubin Urine: NEGATIVE
Glucose, UA: NEGATIVE mg/dL
Ketones, ur: NEGATIVE mg/dL
Leukocytes,Ua: NEGATIVE
Nitrite: NEGATIVE
Protein, ur: NEGATIVE mg/dL
Specific Gravity, Urine: 1.018 (ref 1.005–1.030)
pH: 5 (ref 5.0–8.0)

## 2023-11-30 LAB — APTT: aPTT: 25 s (ref 24–36)

## 2023-11-30 LAB — CBC
HCT: 38 % (ref 36.0–46.0)
Hemoglobin: 12.6 g/dL (ref 12.0–15.0)
MCH: 30.1 pg (ref 26.0–34.0)
MCHC: 33.2 g/dL (ref 30.0–36.0)
MCV: 90.9 fL (ref 80.0–100.0)
Platelets: 269 10*3/uL (ref 150–400)
RBC: 4.18 MIL/uL (ref 3.87–5.11)
RDW: 13.6 % (ref 11.5–15.5)
WBC: 9 10*3/uL (ref 4.0–10.5)
nRBC: 0 % (ref 0.0–0.2)

## 2023-11-30 LAB — ETHANOL: Alcohol, Ethyl (B): <10 mg/dL (ref <10)

## 2023-11-30 LAB — PROTIME-INR
INR: 1 (ref 0.8–1.2)
Prothrombin Time: 13.7 s (ref 11.4–15.2)

## 2023-11-30 LAB — CBG MONITORING, ED: Glucose-Capillary: 186 mg/dL — ABNORMAL HIGH (ref 70–99)

## 2023-11-30 LAB — LIPASE, BLOOD: Lipase: 32 U/L (ref 11–51)

## 2023-11-30 MED ORDER — MECLIZINE HCL 25 MG PO TABS: 25.0000 mg | ORAL_TABLET | Freq: Three times a day (TID) | ORAL | 0 refills | Status: DC | PRN

## 2023-11-30 MED ORDER — MECLIZINE HCL 25 MG PO TABS
25.0000 mg | ORAL_TABLET | Freq: Once | ORAL | Status: AC
  Administered 2023-11-30: 25 mg via ORAL
  Filled 2023-11-30: qty 1

## 2023-11-30 MED ORDER — ACETAMINOPHEN 325 MG PO TABS
650.0000 mg | ORAL_TABLET | Freq: Once | ORAL | Status: AC
  Administered 2023-11-30: 650 mg via ORAL
  Filled 2023-11-30: qty 2

## 2023-11-30 MED ORDER — IOHEXOL 350 MG/ML SOLN
75.0000 mL | Freq: Once | INTRAVENOUS | Status: AC | PRN
  Administered 2023-11-30: 75 mL via INTRAVENOUS

## 2023-11-30 MED ORDER — ONDANSETRON HCL 4 MG/2ML IJ SOLN
4.0000 mg | Freq: Four times a day (QID) | INTRAMUSCULAR | Status: DC | PRN
  Administered 2023-11-30: 4 mg via INTRAVENOUS

## 2023-11-30 MED ORDER — ONDANSETRON 4 MG PO TBDP: 4.0000 mg | ORAL_TABLET | Freq: Three times a day (TID) | ORAL | 0 refills | Status: DC | PRN

## 2023-11-30 NOTE — ED Provider Notes (Signed)
 [place] EMERGENCY DEPARTMENT AT [facility] Provider Note   CSN: [id number] Arrival date & time: 11/30/23  1055  An emergency department physician performed an initial assessment on this suspected stroke patient at 1055.  History  Chief Complaint  Patient presents with   Stroke Like Symptoms   Abdominal Pain    Jessica Roberts is a 78 y.o. female.  HPI       78yo female with history of anxiety, hypertension, breast cancer who presents as a Code Stroke with concern for episode of difficulty speaking.   Went to bed around 11PM, woke at 8AM with severe itching all over. Felt like she was weak all over at that point. Very extreme itching but no rash, no dyspnea, no chest pain, no tongue or throat swelling.  She then called EMS and with them had difficulty finding her words and Code Stroke called. By the time she arrived to the ED and for Neuro evaluation had improvement in her speech. She developed nausea and abdominal pain.  No dyspnea or chest pain. No numbness or focal weakness. Did have some dizziness.    Past Medical History:  Diagnosis Date   Allergy    Anxiety    Anxiety and depression    Breast cancer (HCC) 04/07/15   right breast   Breast cancer of upper-outer quadrant of right female breast (HCC) 04/11/2015   Broken nose    Complication of anesthesia    "had a very hard time waking up"   Dengue fever    Depression    Fall    broken nose   GERD (gastroesophageal reflux disease)    Head injury 1995   concussion in [place], med evac to [place] , ear damage with vertigo   Head injury, acute, with loss of consciousness (HCC) [place]   Head [place], closed, with concussion 1986   [place]   Headache    Hypertension    Malaria    PONV (postoperative nausea and vomiting)    Typhoid     Home Medications Prior to Admission medications   Medication Sig Start Date End Date Taking? Authorizing Provider  meclizine  (ANTIVERT ) 25  MG tablet Take 1 tablet (25 mg total) by mouth 3 (three) times daily as needed for dizziness. 11/30/23  Yes Curatolo, Adam, DO  ondansetron  (ZOFRAN -ODT) 4 MG disintegrating tablet Take 1 tablet (4 mg total) by mouth every 8 (eight) hours as needed for nausea or vomiting. 11/30/23  Yes Curatolo, Adam, DO  acetaminophen  (TYLENOL ) 500 MG tablet Take 500 mg by mouth 2 (two) times daily as needed for moderate pain (pain). Reported on 10/12/2015    [provider]  albuterol  (PROVENTIL  HFA;VENTOLIN  HFA) 108 (90 Base) MCG/ACT inhaler Inhale 1 puff into the lungs every 6 (six) hours as needed for wheezing or shortness of breath.    [provider]  atorvastatin  (LIPITOR) 20 MG tablet 1 tablet Orally Once a day for 30 day(s)    [provider]  buPROPion ER (WELLBUTRIN SR) 100 MG 12 hr tablet Take 100 mg by mouth 2 (two) times daily.    [provider]  diazepam (VALIUM) 5 MG tablet Take 5 mg by mouth every 6 (six) hours as needed for anxiety. As needed    [provider]  DULoxetine (CYMBALTA) 20 MG capsule Take 20 mg by mouth daily.    [provider]  ezetimibe (ZETIA) 10 MG tablet Take 10 mg by mouth daily.  [provider]  metroNIDAZOLE (METROGEL) 0.75 % gel metronidazole 0.75 % topical gel    [provider]  pantoprazole  (PROTONIX ) 40 MG tablet Take 1 tablet every day by oral route. 04/06/22   [provider]  prazosin (MINIPRESS) 2 MG capsule Take 2 mg by mouth at bedtime. 06/10/22   [provider]  rosuvastatin (CRESTOR) 40 MG tablet Take 40 mg by mouth daily. 08/10/21   [provider]  SUMAtriptan (IMITREX) 100 MG tablet Take by mouth. As needed for Migraine    [provider]  traZODone (DESYREL) 50 MG tablet  04/06/22   [provider]  TRELEGY ELLIPTA 100-62.5-25 MCG/ACT AEPB Inhale 1 puff into the lungs daily. 05/28/22   [provider]      Allergies    Codeine,  Oxycontin  [oxycodone  hcl], Percocet [oxycodone -acetaminophen ], and Percodan [oxycodone -aspirin ]    Review of Systems   Review of Systems  Physical Exam Updated Vital Signs BP (!) 158/59   Pulse 80   Temp 98.6 F (37 C) (Oral)   Resp 15   Ht 5\' 6"  (1.676 m)   Wt 82.8 kg   SpO2 94%   BMI 29.46 kg/m  Physical Exam Vitals and nursing note reviewed.  Constitutional:      General: She is not in acute distress.    Appearance: Normal appearance. She is well-developed. She is not ill-appearing or diaphoretic.  HENT:     Head: Normocephalic and atraumatic.  Eyes:     General: No visual field deficit.    Extraocular Movements: Extraocular movements intact.     Conjunctiva/sclera: Conjunctivae normal.     Pupils: Pupils are equal, round, and reactive to light.  Cardiovascular:     Rate and Rhythm: Normal rate and regular rhythm.     Pulses: Normal pulses.     Heart sounds: Normal heart sounds. No murmur heard.    No friction rub. No gallop.  Pulmonary:     Effort: Pulmonary effort is normal. No respiratory distress.     Breath sounds: Normal breath sounds. No wheezing or rales.  Abdominal:     General: There is no distension.     Palpations: Abdomen is soft.     Tenderness: There is no abdominal tenderness. There is no guarding.  Musculoskeletal:        General: No swelling or tenderness.     Cervical back: Normal range of motion.  Skin:    General: Skin is warm and dry.     Findings: No erythema or rash.  Neurological:     General: No focal deficit present.     Mental Status: She is alert and oriented to person, place, and time.     GCS: GCS eye subscore is 4. GCS verbal subscore is 5. GCS motor subscore is 6.     Cranial Nerves: No cranial nerve deficit, dysarthria or facial asymmetry.     Sensory: No sensory deficit.     Motor: No weakness or tremor.     Coordination: Coordination normal. Finger-Nose-Finger Test normal.     Gait: Gait normal.     ED Results /  Procedures / Treatments   Labs (all labs ordered are listed, but only abnormal results are displayed) Labs Reviewed  COMPREHENSIVE METABOLIC PANEL WITH GFR - Abnormal; Notable for the following components:      Result Value   Glucose, Bld 187 (*)    All other components within normal limits  URINALYSIS, W/ REFLEX TO CULTURE (INFECTION SUSPECTED) -  Abnormal; Notable for the following components:   Hgb urine dipstick SMALL (*)    Bacteria, UA RARE (*)    All other components within normal limits  I-STAT CHEM 8, ED - Abnormal; Notable for the following components:   Glucose, Bld 183 (*)    Calcium , Ion 1.14 (*)    All other components within normal limits  CBG MONITORING, ED - Abnormal; Notable for the following components:   Glucose-Capillary 186 (*)    All other components within normal limits  PROTIME-INR  APTT  CBC  DIFFERENTIAL  ETHANOL  LIPASE, BLOOD  TROPONIN I (HIGH SENSITIVITY)  TROPONIN I (HIGH SENSITIVITY)    EKG EKG Interpretation Date/Time:  Saturday November 30 2023 11:20:05 EDT Ventricular Rate:  76 PR Interval:  192 QRS Duration:  103 QT Interval:  498 QTC Calculation: 560 R Axis:   -19  Text Interpretation: Sinus rhythm Borderline left axis deviation Nonspecific T abnrm, anterolateral leads Prolonged QT interval No significant change since last tracing Confirmed by Scarlette Currier ([id number]) on 11/30/2023 4:17:19 PM  Radiology CT ABDOMEN PELVIS W CONTRAST Result Date: 11/30/2023 CLINICAL DATA:  Abdomen pain EXAM: CT ABDOMEN AND PELVIS WITH CONTRAST TECHNIQUE: Multidetector CT imaging of the abdomen and pelvis was performed using the standard protocol following bolus administration of intravenous contrast. RADIATION DOSE REDUCTION: This exam was performed according to the departmental dose-optimization program which includes automated exposure control, adjustment of the mA and/or kV according to patient size and/or use of iterative reconstruction technique. CONTRAST:   75mL OMNIPAQUE  IOHEXOL  350 MG/ML SOLN COMPARISON:  CT 08/24/2021 FINDINGS: Lower chest: Lung bases demonstrate no acute airspace disease. Large hiatal hernia Hepatobiliary: Hepatic steatosis. Gallstones. No biliary dilatation. Liver is enlarged, right hepatic lobe craniocaudal measurement of 22 cm. Pancreas: Unremarkable. No pancreatic ductal dilatation or surrounding inflammatory changes. Spleen: Normal in size without focal abnormality. Adrenals/Urinary Tract: Adrenal glands are normal. Kidneys show no hydronephrosis. Renal cysts for which no imaging follow-up is recommended. The bladder is normal. Stomach/Bowel: The stomach is nonenlarged. There is no dilated small bowel. No acute bowel wall thickening. Negative appendix. Diverticular disease of the left colon Vascular/Lymphatic: Aortic atherosclerosis. No enlarged abdominal or pelvic lymph nodes. Reproductive: Uterus and bilateral adnexa are unremarkable. Other: Negative for pelvic effusion or free air. Small fat containing umbilical hernia Musculoskeletal: No acute osseous abnormality. Probable bone islands in the left iliac bone, no change IMPRESSION: 1. No CT evidence for acute intra-abdominal or pelvic abnormality. 2. Hepatic steatosis and hepatomegaly. 3. Cholelithiasis. 4. Diverticular disease of the left colon without acute inflammatory process. 5. Large hiatal hernia. 6. Aortic atherosclerosis. Aortic Atherosclerosis (ICD10-I70.0). Electronically Signed   By: Esmeralda Hedge M.D.   On: 11/30/2023 17:42   CT ANGIO HEAD NECK W WO CM Result Date: 11/30/2023 CLINICAL DATA:  Stroke/TIA.  Determine embolic source.  Aphasia. EXAM: CT ANGIOGRAPHY HEAD AND NECK WITH AND WITHOUT CONTRAST TECHNIQUE: Multidetector CT imaging of the head and neck was performed using the standard protocol during bolus administration of intravenous contrast. Multiplanar CT image reconstructions and MIPs were obtained to evaluate the vascular anatomy. Carotid stenosis measurements  (when applicable) are obtained utilizing NASCET criteria, using the distal internal carotid diameter as the denominator. RADIATION DOSE REDUCTION: This exam was performed according to the departmental dose-optimization program which includes automated exposure control, adjustment of the mA and/or kV according to patient size and/or use of iterative reconstruction technique. CONTRAST:  75mL OMNIPAQUE  IOHEXOL  350 MG/ML SOLN COMPARISON:  None Available. FINDINGS: CTA NECK FINDINGS  Aortic arch: Normal Right carotid system: Common carotid artery widely patent to the bifurcation. Soft plaque at the carotid bifurcation and ICA bulb with some irregularity. No stenosis. This could be a source of embolic disease. ICA widely patent beyond that. Left carotid system: Common carotid artery widely patent to the bifurcation. Calcified plaque at the bifurcation and ICA bulb but no stenosis. Cervical ICA widely patent beyond that. Vertebral arteries: Both vertebral artery origins are widely patent. Both vessels are normal through the cervical region to the foramen magnum. The left is dominant. Skeleton: Ordinary mid cervical spondylosis. Other neck: No mass or lymphadenopathy. Nodule at the lower pole of the right lobe of the thyroid  measuring 1.5 cm in diameter. No follow-up recommended. Upper chest: Interstitial prominence, likely chronic. No focal finding otherwise. Review of the MIP images confirms the above findings CTA HEAD FINDINGS Anterior circulation: Both internal carotid arteries are patent through the skull base and siphon regions. The anterior and middle cerebral vessels are patent. No large vessel occlusion or proximal stenosis. No aneurysm or vascular malformation. Posterior circulation: Both vertebral arteries widely patent through the foramen magnum to the basilar artery. No basilar stenosis. Posterior circulation branch vessels are normal. Venous sinuses: Patent and normal. Anatomic variants: None significant.  Review of the MIP images confirms the above findings IMPRESSION: 1. No intracranial large vessel occlusion or proximal stenosis. 2. Soft plaque at the right carotid bifurcation and ICA bulb with some irregularity. No stenosis. This could be a source of embolic disease. 3. Calcified plaque at the left carotid bifurcation and ICA bulb but no stenosis. 4. 1.5 cm nodule at the lower pole of the right lobe of the thyroid . No follow-up recommended. Electronically Signed   By: Bettylou Brunner M.D.   On: 11/30/2023 15:21   MR BRAIN WO CONTRAST Result Date: 11/30/2023 CLINICAL DATA:  Neuro deficit, acute, stroke suspected.  Aphasia. EXAM: MRI HEAD WITHOUT CONTRAST TECHNIQUE: Multiplanar, multiecho pulse sequences of the brain and surrounding structures were obtained without intravenous contrast. COMPARISON:  Head CT earlier today.  MRI 09/04/2013 FINDINGS: Brain: Diffusion imaging does not show any acute or subacute infarction or other cause of restricted diffusion. There are extensive chronic small-vessel ischemic changes throughout the pons. No focal cerebellar finding. Cerebral hemispheres show chronic small-vessel ischemic changes of the thalami, basal ganglia and throughout the cerebral hemispheric white matter. No large vessel stroke. Focus of hemosiderin deposition in the left parieto-occipital white matter consistent with an old hemorrhagic small vessel stroke in that location. This was not present in 2015. The findings in general are progressive over time. No evidence of neoplastic mass, hydrocephalus or extra-axial collection. The patient has developed a mass filling the sella and bulging into the suprasellar region consistent with a pituitary adenoma. This measures 2.0 x 1.5 x 1.6 cm. Definite cavernous sinus extension is not seen. The mass approaches the optic chiasm but does not definitely compress the optic chiasm. Vascular: Major vessels at the base of the brain show flow. Skull and upper cervical spine:  Negative Sinuses/Orbits: Acute right maxillary rhinosinusitis. Mucosal inflammatory changes elsewhere within the sinuses as well. Orbits negative. Other: None IMPRESSION: 1. No acute brain finding. Extensive chronic small-vessel ischemic changes of the pons, thalami, basal ganglia and cerebral hemispheric white matter. Focus of hemosiderin deposition in the left parieto-occipital white matter consistent with an old hemorrhagic small vessel stroke in that location. The findings in general are progressive over time. 2. 2.0 x 1.5 x 1.6 cm mass filling the sella  and bulging into the suprasellar region consistent with a pituitary adenoma. Definite cavernous sinus extension is not seen. The mass approaches the optic chiasm but does not definitely compress the optic chiasm. 3. Acute right maxillary rhinosinusitis. Mucosal inflammatory changes elsewhere within the sinuses as well. Electronically Signed   By: Bettylou Brunner M.D.   On: 11/30/2023 15:03   CT HEAD CODE STROKE WO CONTRAST Result Date: 11/30/2023 CLINICAL DATA:  Code stroke. Neuro deficit, acute, stroke suspected. Aphasia. EXAM: CT HEAD WITHOUT CONTRAST TECHNIQUE: Contiguous axial images were obtained from the base of the skull through the vertex without intravenous contrast. RADIATION DOSE REDUCTION: This exam was performed according to the departmental dose-optimization program which includes automated exposure control, adjustment of the mA and/or kV according to patient size and/or use of iterative reconstruction technique. COMPARISON:  09/04/2013 FINDINGS: Brain: Age related volume loss. Chronic small-vessel ischemic change and or demyelinating disease within the cerebral hemispheric white matter appears similar or minimally progressive when compared to the MRI of 2015. No sign of acute infarction, mass lesion, hemorrhage, hydrocephalus or extra-axial collection. Vascular: There is atherosclerotic calcification of the major vessels at the base of the brain.  Skull: Negative Sinuses/Orbits: Rhinosinusitis of the right maxillary sinus. Lesser inflammatory changes in the ethmoid and frontal sinuses. Orbits negative. Other: None ASPECTS (Alberta Stroke Program Early CT Score) - Ganglionic level infarction (caudate, lentiform nuclei, internal capsule, insula, M1-M3 cortex): 7 - Supraganglionic infarction (M4-M6 cortex): 3 Total score (0-10 with 10 being normal): 10 IMPRESSION: 1. No acute CT finding. Age related volume loss. Chronic small-vessel ischemic change and/or demyelinating disease within the cerebral hemispheric white matter, similar or minimally progressive when compared to the MRI of 2015. 2. Aspects is 10. 3. Right maxillary sinusitis. Lesser inflammatory changes in the ethmoid and frontal sinuses. 4. These results were communicated to Dr. Cleone Dad at 11:11 am on 11/30/2023 by text page via the [facility] messaging system. Electronically Signed   By: Bettylou Brunner M.D.   On: 11/30/2023 11:12    Procedures Procedures    Medications Ordered in ED Medications  ondansetron  (ZOFRAN ) injection 4 mg (4 mg Intravenous Given 11/30/23 1129)  iohexol  (OMNIPAQUE ) 350 MG/ML injection 75 mL (75 mLs Intravenous Contrast Given 11/30/23 1437)  meclizine  (ANTIVERT ) tablet 25 mg (25 mg Oral Given 11/30/23 1640)  acetaminophen  (TYLENOL ) tablet 650 mg (650 mg Oral Given 11/30/23 1832)    ED Course/ Medical Decision Making/ A&P                                  79yo female with history of anxiety, hypertension, breast cancer who presents as a Code Stroke with concern for episode of difficulty speaking with symptoms of itching preceding this and nausea.  DDx includes anaphylaxis, CVA, TIA, electrolyte abnormalities, anemia, arrhythmia, stroke, intraabdominal process, infection.  CT head completed and without signs of ICH.  Discussed with neurology and exam not consistent with LVO that would need acute intervention.  Not having rash, no tongue or throat swelling, no  continued itching and have low suspicion for anaphylactic reaction.  Labs evalauted by me without significant electrolyte abnormalities. Troponin negative.  Lipase WNL.  CTA with some plaque , MRI with findings consistent with possible pituitary adenoma. Plan to follow up on CT abdomen pelvis and Neurology recommendations.   CT abdomen pelvis pending.       Final Clinical Impression(s) / ED Diagnoses Final diagnoses:  Pituitary adenoma (  HCC)  Abdominal pain, unspecified abdominal location  Dizziness    Rx / DC Orders ED Discharge Orders          Ordered    Ambulatory referral to Endocrinology        11/30/23 1633    meclizine  (ANTIVERT ) 25 MG tablet  3 times daily PRN        11/30/23 1824    ondansetron  (ZOFRAN -ODT) 4 MG disintegrating tablet  Every 8 hours PRN        11/30/23 Lus Salter, MD 11/30/23 2349

## 2023-11-30 NOTE — ED Notes (Signed)
Lab made aware of add-on labs

## 2023-11-30 NOTE — ED Provider Notes (Signed)
 Patient with unremarkable MRIs.  Incidental pituitary adenoma.  Will follow-up with ophthalmology and endocrine outpatient.  Overall no evidence of stroke.  Cleared by neurology from their standpoint.  No need for further workup at this time.  Abdominal CT is unremarkable.  She is not having any abdominal pain on my examination.  Dizzy feelings improved with meclizine .  She is ambulatory with me in the room with very minimal symptoms.  Ultimately will prescribe meclizine  and Zofran  and have her follow-up with endocrine and ophthalmology and her primary care doctor.  Told to return if symptoms worsen.  Discharged in good condition.  This chart was dictated using voice recognition software.  Despite best efforts to proofread,  errors can occur which can change the documentation meaning.    Lowery Rue, DO 11/30/23 1824

## 2023-11-30 NOTE — ED Notes (Signed)
 Per EDP, Pt given a sandwich bag with additional saltines, cranberry juice, and ginger ale.

## 2023-11-30 NOTE — ED Notes (Signed)
 Pt attempted to ambulate.  Pt reported dizziness upon sitting up and throughout ambulation.  Pt was only about to walk about 30ft and held very tightly onto this writer during ambulation.

## 2023-11-30 NOTE — ED Notes (Signed)
 Pt continues to get very dizzy with any movement.  Pt reports she was diagnosed w/ vertigo x20 years ago.

## 2023-11-30 NOTE — Progress Notes (Signed)
 PT Cancellation Note  Patient Details Name: Jessica Roberts MRN: [id number] DOB: 03-Jul-1946   Cancelled Treatment:    Reason Eval/Treat Not Completed: Other (comment). Discussed with Dr. Colonel Dears, no current need for vestibular evaluation. PT will complete orders at this time. If evaluation does become necessary please re-consult physical therapy.   Rexie Catena 11/30/2023, 5:11 PM

## 2023-11-30 NOTE — ED Triage Notes (Signed)
 Patient BIB EMS with stroke like symptoms (aphasia). Patient reports last known well to be 10 PM last night. Patient called this AM for generalized weakness when she then noticed the stroke like symptoms. C/O nausea. 4mg  Zofran  given en route.  CBG 186 BP 163/77

## 2023-11-30 NOTE — ED Notes (Signed)
 Neurologist at bedside.

## 2023-11-30 NOTE — ED Notes (Signed)
 Pt c/o dizziness and nausea w/ movement.

## 2023-11-30 NOTE — Code Documentation (Addendum)
 Stroke Response Nurse Documentation Code Documentation  Jessica Roberts is a 78 y.o. female arriving to [facility]  via [facility] as Code Stroke Activation. Patient LKW 2300 last night prior to bed. Upon waking up this AM she was itching all over and having expressive aphasia.   Stroke team met pt at bridge. Labs drawn, airway cleared by EDP Schlossman and pt taken to CT. NIH 0. CT head completed. Out of window, not consistent with LVO. Patient still endorses dizziness and nausea, returned to ED room for further medical workup. Zofran  IV given. Care Plan: q2h NIH and vitals.  CTA ordered. Bedside handoff with ED RN Vernice Goodell.    Ronney Cola K  Rapid Response RN

## 2023-11-30 NOTE — Discharge Instructions (Signed)
 Follow-up with ophthalmology and endocrinology regarding your incidental pituitary adenoma.  Follow-up with your primary care doctor regarding your abdominal pain and dizziness.  Take meclizine  and Zofran  as needed for dizziness and nausea respectively.  Return if symptoms worsen.

## 2023-11-30 NOTE — Consult Note (Addendum)
 NEUROLOGY CONSULT NOTE   Date of service: November 30, 2023 Patient Name: Jessica Roberts MRN:  [id number] DOB:  11-07-1945 Chief Complaint: "Code Stroke" Requesting Provider: Scarlette Currier, MD  History of Present Illness  Jessica Roberts is a 78 y.o. female with hx of anxiety, CAD, breast cancer, depression, GERD, head injury with loss of consciousness and resulting vertigo, hypertension, hyperlipidemia, PTSD/anxiety, macular degeneration bilateral eyes, dengue fever, malaria, typhoid (she has traveled back and forth to [place]).    Last night she went to bed around 11 PM in her normal state of health.  She woke up around 8 AM and felt like she was itching all over.  She also reports feeling generally weak and felt like she could not walk.. She called 911. While on the phone with 911 she noted difficulty getting her words out.  She states she knew what she wanted to say but she could not get out.  And route with EMS she became nauseous and required 4 mg of Zofran .  She was able to tolerate her CT head, however prior to the CTA she became nauseous and additionally reported dizziness, abdominal pain and some neck pain on the right side going into her shoulder.    It was decided to go back to the ED room for further medical evaluation to determine all necessary scans prior to returning to CT, given her examination was not consistent with an LVO.  In this setting CTA head and neck would be a more routine studies for stroke/TIA workup rather than emergent decision-making.   She received an additional 4mg  of zofran  and EKG was done. Denies urinary symptoms, changes to diet, or recent illiness  LKW: 11pm Modified rankin score: 0 IV Thrombolysis: No, out of the window EVT: No, no LVO  NIHSS components Score: Comment  1a Level of Conscious 0[x]  1[]  2[]  3[]      1b LOC Questions 0[x]  1[]  2[]       1c LOC Commands 0[x]  1[]  2[]       2 Best Gaze 0[x]  1[]  2[]       3 Visual 0[x]  1[]   2[]  3[]      4 Facial Palsy 0[x]  1[]  2[]  3[]      5a Motor Arm - left 0[x]  1[]  2[]  3[]  4[]  UN[]    5b Motor Arm - Right 0[x]  1[]  2[]  3[]  4[]  UN[]    6a Motor Leg - Left 0[x]  1[]  2[]  3[]  4[]  UN[]    6b Motor Leg - Right 0[x]  1[]  2[]  3[]  4[]  UN[]    7 Limb Ataxia 0[x]  1[]  2[]  3[]  UN[]     8 Sensory 0[x]  1[]  2[]  UN[]      9 Best Language 0[x]  1[]  2[]  3[]      10 Dysarthria 0[x]  1[]  2[]  UN[]      11 Extinct. and Inattention 0[x]  1[]  2[]       TOTAL: 0       ROS  Comprehensive ROS performed and pertinent positives documented in HPI   Past History   Past Medical History:  Diagnosis Date   Allergy    Anxiety    Anxiety and depression    Breast cancer (HCC) 04/07/15   right breast   Breast cancer of upper-outer quadrant of right female breast (HCC) 04/11/2015   Broken nose    Complication of anesthesia    "had a very hard time waking up"   Dengue fever    Depression    Fall    broken nose   GERD (gastroesophageal reflux disease)  Head injury 1995   concussion in [place], med evac to [place] , ear damage with vertigo   Head injury, acute, with loss of consciousness (HCC) 2000   [place] of the country   Head injury, closed, with concussion 1986   in [place]   Headache    Hypertension    Malaria    PONV (postoperative nausea and vomiting)    Typhoid     Past Surgical History:  Procedure Laterality Date   BREAST LUMPECTOMY Right 04/27/2015   BREAST LUMPECTOMY WITH NEEDLE LOCALIZATION AND AXILLARY SENTINEL LYMPH NODE BX Right 04/27/2015   Procedure: RIGHT BREAST LUMPECTOMY WITH  TWO NEEDLE LOCALIZATION AND TWO RIGHT AXILLARY SENTINEL LYMPH NODE BX;  Surgeon: Boyce Byes, MD;  Location: [facility] OR;  Service: General;  Laterality: Right;   COLONOSCOPY W/ BIOPSIES AND POLYPECTOMY     NERVE SURGERY     '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' during surgery for endometriosis   surgical procedure for endometriosis     TONSILLECTOMY      Family History: Family History  Problem Relation Age of Onset    Hypertension Mother    Thyroid  disease Mother    Breast cancer Mother    Emphysema Father    Diabetes Maternal Grandmother    Heart disease Paternal Grandmother    Alcohol abuse Paternal Grandfather     Social History  reports that she quit smoking about 29 years ago. Her smoking use included cigarettes. She started smoking about 59 years ago. She has a 45 pack-year smoking history. She has never used smokeless tobacco. She reports current alcohol use of about 4.0 standard drinks of alcohol per week. She reports that she does not use drugs.  Allergies  Allergen Reactions   Codeine Other (See Comments)    Nervous system dysfunction, cannot control or use limbs   Oxycontin  [Oxycodone  Hcl] Other (See Comments)   Percocet [Oxycodone -Acetaminophen ] Nausea And Vomiting   Percodan [Oxycodone -Aspirin ] Nausea And Vomiting    Medications   Current Facility-Administered Medications:    ondansetron  (ZOFRAN ) injection 4 mg, 4 mg, Intravenous, Q6H PRN, Dela Favor, Devon, NP, 4 mg at 11/30/23 1129  Current Outpatient Medications:    acetaminophen  (TYLENOL ) 500 MG tablet, Take 500 mg by mouth 2 (two) times daily as needed for moderate pain (pain). Reported on 10/12/2015, Disp: , Rfl:    albuterol  (PROVENTIL  HFA;VENTOLIN  HFA) 108 (90 Base) MCG/ACT inhaler, Inhale 1 puff into the lungs every 6 (six) hours as needed for wheezing or shortness of breath., Disp: , Rfl:    atorvastatin  (LIPITOR) 20 MG tablet, 1 tablet Orally Once a day for 30 day(s), Disp: , Rfl:    buPROPion ER (WELLBUTRIN SR) 100 MG 12 hr tablet, Take 100 mg by mouth 2 (two) times daily., Disp: , Rfl:    diazepam (VALIUM) 5 MG tablet, Take 5 mg by mouth every 6 (six) hours as needed for anxiety. As needed, Disp: , Rfl:    DULoxetine (CYMBALTA) 20 MG capsule, Take 20 mg by mouth daily., Disp: , Rfl:    ezetimibe (ZETIA) 10 MG tablet, Take 10 mg by mouth daily., Disp: , Rfl:    metroNIDAZOLE (METROGEL) 0.75 % gel, metronidazole 0.75 %  topical gel, Disp: , Rfl:    pantoprazole  (PROTONIX ) 40 MG tablet, Take 1 tablet every day by oral route., Disp: , Rfl:    prazosin (MINIPRESS) 2 MG capsule, Take 2 mg by mouth at bedtime., Disp: , Rfl:    rosuvastatin (CRESTOR) 40 MG tablet, Take 40 mg  by mouth daily., Disp: , Rfl:    SUMAtriptan (IMITREX) 100 MG tablet, Take by mouth. As needed for Migraine, Disp: , Rfl:    traZODone (DESYREL) 50 MG tablet, , Disp: , Rfl:    TRELEGY ELLIPTA 100-62.5-25 MCG/ACT AEPB, Inhale 1 puff into the lungs daily., Disp: , Rfl:   Vitals   Vitals:   12-14-23 1130 Dec 14, 2023 1145 14-Dec-2023 1200 2023/12/14 1215  BP: (!) 162/76 (!) 183/94  (!) 162/79  Pulse: 74 73 66 69  Resp: 15 14 (!) 7 12  Temp:      TempSrc:      SpO2: 98% (!) 88% 94% 91%  Weight:      Height:        Body mass index is 29.46 kg/m.  Physical Exam   Constitutional: Appears well-developed and well-nourished.  Psych: Affect appropriate to situation.  Eyes: No scleral injection.  HENT: No OP obstruction.  Head: Normocephalic.  Cardiovascular: Normal rate and regular rhythm.  Respiratory: Effort normal, non-labored breathing.  GI: Diffuse abdominal pain, nausea and retching. No emesis.  Skin: WDI.   Neurologic Examination   Neuro: Mental Status: Patient is awake, alert, oriented to person, place, month, year, and situation. Patient is able to give a clear and coherent history. No signs of aphasia or neglect Cranial Nerves: II: Visual Fields are full. Pupils are equal, round, and reactive to light.   III,IV, VI: EOMI without ptosis or diploplia, subtle left beating nystagmus with left gaze. Dizziness does not worsen with lateral or horizontal eye movements.  V: Facial sensation is symmetric to temperature VII: Facial movement is symmetric resting and smiling VIII: Hearing is intact to voice X: Palate elevates symmetrically XI: Shoulder shrug is symmetric. XII: Tongue protrudes midline without atrophy or fasciculations.   Motor: Tone is normal. Bulk is normal. 5/5 strength was present in all four extremities.  Sensory: Sensation is symmetric to light touch and temperature in the arms and legs. No extinction to DSS present.  Cerebellar: FNF and HKS are intact bilaterally  Labs/Imaging/Neurodiagnostic studies   CBC:  Recent Labs  Lab 14-Dec-2023 1058 12/14/2023 1101  WBC 9.0  --   NEUTROABS 5.8  --   HGB 12.6 12.6  HCT 38.0 37.0  MCV 90.9  --   PLT 269  --    Basic Metabolic Panel:  Lab Results  Component Value Date   NA 136 Dec 14, 2023   K 4.0 14-Dec-2023   CO2 23 12/14/23   GLUCOSE 183 (H) 14-Dec-2023   BUN 14 12/14/23   CREATININE 0.60 2023/12/14   CALCIUM  9.5 2023/12/14   GFRNONAA >60 12-14-23   Alcohol Level     Component Value Date/Time   ETH <10 12-14-2023 1058   INR  Lab Results  Component Value Date   INR 1.0 12-14-23   APTT  Lab Results  Component Value Date   APTT 25 12-14-2023   CT Head without contrast(Personally reviewed): No acute CT finding. Age related volume loss. Chronic small-vessel ischemic change and/or demyelinating disease within the cerebral hemispheric white matter, similar or minimally progressive when compared to the MRI of 2015. Aspects is 10.  CTA head and neck 1. No intracranial large vessel occlusion or proximal stenosis. 2. Soft plaque at the right carotid bifurcation and ICA bulb with some irregularity. No stenosis. This could be a source of embolic disease. 3. Calcified plaque at the left carotid bifurcation and ICA bulb but no stenosis. 4. 1.5 cm nodule at the lower pole of the  right lobe of the thyroid . No follow-up recommended.  MRI brain (personally reviewed) 1. No acute brain finding. Extensive chronic small-vessel ischemic changes of the pons, thalami, basal ganglia and cerebral hemispheric white matter. 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 The findings in general are progressive over time. 2. 2.0 x 1.5 x 1.6 cm mass filling the sella and bulging into the suprasellar region consistent with a pituitary adenoma. Definite cavernous sinus extension is not seen. The mass approaches the optic chiasm but does not definitely compress the optic chiasm. 3. Acute right maxillary rhinosinusitis. Mucosal inflammatory changes elsewhere within the sinuses as well.   ASSESSMENT   Jessica Roberts is a 78 y.o. right-handed woman presenting with a transient episode of aphasia, generalized weakness and nausea. CTA Head and Neck ordered, recommend additional work up for nausea and abdominal pain.  During initial evaluation, unclear of the etiology of her speech difficulty was secondary to the other symptoms she was experiencing or truly represents a TIA.  However on further discussion after MRI and CTA completed, patient describes fairly nonspecific speech difficulties and reports she had similar difficulties during MRI, but no speech difficulties when she is talking to her friend at bedside before and after MRI.  She confirms she is right-handed and therefore the soft plaque in the right carotid would not anatomically explain her symptoms  RECOMMENDATIONS   Emergent recommendations: - CTA Head and Neck when able to lay flat - Abdominal work up per EDP- CT Abd/pelvis, troponin, lipase - UA pending - MRI brain without contrast  Additional recommendations following MRI brain and CTA head and neck, reports included above: - Speech difficulty not consistent with TIA on further history, no further TIA workup - Please note Dix-Hallpike attempted on reevaluation at approximately 4:15 PM, negative but slightly suboptimal particularly to the right side due to patient tolerance of positioning - Abdominal pain workup per ED as above - Outpatient follow-up with ophthalmology and endocrinology for pituitary adenoma -  Vestibular PT if she is having ambulatory difficulty - Inpatient neurology will sign off at this time, please reach out if any other questions or concerns  ______________________________________________________________________  Signed, Imogene Mana, NP [facility]  Attending Neurologist's note:  I personally saw this patient, gathering history, performing a neurologic examination, reviewing relevant labs, personally reviewing relevant imaging including head CT, and formulated the assessment and plan, adding the note above for completeness and clarity to accurately reflect my thoughts  Baldwin Levee MD-PhD [facility] [phone] Available 7 AM to 7 PM, outside these hours please contact Neurologist on call listed on AMION   CRITICAL CARE Performed by: Ronnette Coke   Total critical care time: 60 minutes  Critical care time was exclusive of separately billable procedures and treating other patients.  Critical care was necessary to treat or prevent imminent or life-threatening deterioration -- emergent evaluation for consideration of thrombectomy or thrombolytic  Critical care was time spent personally by me on the following activities: development of treatment plan with patient and/or surrogate as well as nursing, discussions with consultants, evaluation of patient's response to treatment, examination of patient, obtaining history from patient or surrogate, ordering and performing treatments and interventions, ordering and review of laboratory studies, ordering and review of radiographic studies, pulse oximetry and re-evaluation of patient's condition.

## 2023-12-16 ENCOUNTER — Other Ambulatory Visit: Payer: Self-pay

## 2023-12-16 DIAGNOSIS — Z170 Estrogen receptor positive status [ER+]: Secondary | ICD-10-CM

## 2023-12-17 ENCOUNTER — Inpatient Hospital Stay: Payer: Medicare Other | Attending: Nurse Practitioner

## 2023-12-17 DIAGNOSIS — C50411 Malignant neoplasm of upper-outer quadrant of right female breast: Secondary | ICD-10-CM

## 2023-12-17 DIAGNOSIS — Z853 Personal history of malignant neoplasm of breast: Secondary | ICD-10-CM | POA: Diagnosis present

## 2023-12-17 DIAGNOSIS — Z08 Encounter for follow-up examination after completed treatment for malignant neoplasm: Secondary | ICD-10-CM | POA: Diagnosis present

## 2023-12-17 DIAGNOSIS — D509 Iron deficiency anemia, unspecified: Secondary | ICD-10-CM | POA: Diagnosis present

## 2023-12-17 LAB — CMP (CANCER CENTER ONLY)
ALT: 23 U/L (ref 0–44)
AST: 25 U/L (ref 15–41)
Albumin: 4.2 g/dL (ref 3.5–5.0)
Alkaline Phosphatase: 84 U/L (ref 38–126)
Anion gap: 6 (ref 5–15)
BUN: 12 mg/dL (ref 8–23)
CO2: 27 mmol/L (ref 22–32)
Calcium: 9.3 mg/dL (ref 8.9–10.3)
Chloride: 107 mmol/L (ref 98–111)
Creatinine: 0.99 mg/dL (ref 0.44–1.00)
GFR, Estimated: 59 mL/min — ABNORMAL LOW (ref >60)
Glucose, Bld: 151 mg/dL — ABNORMAL HIGH (ref 70–99)
Potassium: 3.9 mmol/L (ref 3.5–5.1)
Sodium: 140 mmol/L (ref 135–145)
Total Bilirubin: 0.6 mg/dL (ref 0.0–1.2)
Total Protein: 7.3 g/dL (ref 6.5–8.1)

## 2023-12-17 LAB — CBC WITH DIFFERENTIAL (CANCER CENTER ONLY)
Abs Immature Granulocytes: 0.03 10*3/uL (ref 0.00–0.07)
Basophils Absolute: 0.1 10*3/uL (ref 0.0–0.1)
Basophils Relative: 1 %
Eosinophils Absolute: 0.2 10*3/uL (ref 0.0–0.5)
Eosinophils Relative: 2 %
HCT: 37.1 % (ref 36.0–46.0)
Hemoglobin: 12.6 g/dL (ref 12.0–15.0)
Immature Granulocytes: 0 %
Lymphocytes Relative: 21 %
Lymphs Abs: 1.7 10*3/uL (ref 0.7–4.0)
MCH: 29.9 pg (ref 26.0–34.0)
MCHC: 34 g/dL (ref 30.0–36.0)
MCV: 87.9 fL (ref 80.0–100.0)
Monocytes Absolute: 0.6 10*3/uL (ref 0.1–1.0)
Monocytes Relative: 7 %
Neutro Abs: 5.7 10*3/uL (ref 1.7–7.7)
Neutrophils Relative %: 69 %
Platelet Count: 295 10*3/uL (ref 150–400)
RBC: 4.22 MIL/uL (ref 3.87–5.11)
RDW: 13.9 % (ref 11.5–15.5)
WBC Count: 8.1 10*3/uL (ref 4.0–10.5)
nRBC: 0 % (ref 0.0–0.2)

## 2023-12-17 LAB — IRON AND IRON BINDING CAPACITY (CC-WL,HP ONLY)
Iron: 70 ug/dL (ref 28–170)
Saturation Ratios: 18 % (ref 10.4–31.8)
TIBC: 400 ug/dL (ref 250–450)
UIBC: 330 ug/dL (ref 148–442)

## 2023-12-17 LAB — FERRITIN: Ferritin: 27 ng/mL (ref 11–307)

## 2023-12-18 ENCOUNTER — Ambulatory Visit: Payer: Medicare Other | Admitting: Dietician

## 2023-12-23 ENCOUNTER — Encounter (HOSPITAL_BASED_OUTPATIENT_CLINIC_OR_DEPARTMENT_OTHER): Payer: Self-pay

## 2023-12-31 ENCOUNTER — Encounter: Attending: Family | Admitting: Dietician

## 2023-12-31 ENCOUNTER — Encounter: Payer: Self-pay | Admitting: Dietician

## 2023-12-31 VITALS — Wt 178.0 lb

## 2023-12-31 DIAGNOSIS — E119 Type 2 diabetes mellitus without complications: Secondary | ICD-10-CM | POA: Diagnosis present

## 2023-12-31 NOTE — Progress Notes (Signed)
 Appointment start:  1525 Appointment End:  1555  Patient attended Diabetes Core Classes 1-3 between 09/03/23 and 10/29/23 at [facility]. The purpose of the meeting today is to review information learned during those classes as well as review patient application and goals.  Wt Readings from Last 3 Encounters:  12/31/23 178 lb (80.7 kg)  11/30/23 182 lb 8.7 oz (82.8 kg)  11/18/23 184 lb 6.4 oz (83.6 kg)   Pt reports she is following the Dr. Valeta Gaudier Healthy Gut Diet. Pt states she is not eating beef or pork.   Pt reports she is starting ozempic next month.  Pt reports she went to a 12 week cardiac support exercise program at the [facility] and is walking with grandkids 2 days per week. Pt states she plans to start exercise classes at the senior support center.   What are one or two positive things that you are doing right now to manage your diabetes?  Trying to eat more vegetables (salad greens, avocado) and exercising.  What is the hardest part about your diabetes right now, causing you the most concern, or is the most worrisome to you about your diabetes?  Food choices, blood sugar.   What questions do you have today?  N/A  Have you participated in any diabetes support group?  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   24 hour diet recall: Breakfast:  none Snack:  none Lunch:  leftovers OR sourdough sandwich Snack:  none Dinner:  salad with chicken Snack:  crackers OR nuts Beverages:  water   Specific focus but not limited to the following: Review of blood glucose monitoring and interpretation  including the recommended target ranges and Hgb A1c.  Review of carbohydrate counting, importance of regularly scheduled meals/snacks, and meal planning to improve quality of diet. Review of the effects of physical activity on glucose levels and long-term glucose control.  Recommended goal of 150 minutes of physical activity/week. Review of patient medications and discussed role of medication on blood glucose and possible side effects. Discussion of strategies to manage stress, psychosocial issues, and other obstacles to diabetes management. Review of short-term complications: hyper- and hypo-glycemia (causes, symptoms, and treatment options) Review of prevention, detection, and treatment of long-term complications.  Discussion of the role of prolonged elevated glucose levels on body systems.  Continuing Goals:  Goals Established by Patient:   Goal: exercise 3 times per week.   Goal: avoid eating pork.   Goal: take medication as prescribed.   Future Follow up:  2 months

## 2024-01-16 ENCOUNTER — Other Ambulatory Visit: Payer: Self-pay | Admitting: Family Medicine

## 2024-01-16 ENCOUNTER — Ambulatory Visit
Admission: RE | Admit: 2024-01-16 | Discharge: 2024-01-16 | Disposition: A | Discharge status: Home / Self Care | Source: Ambulatory Visit | Attending: Family Medicine | Admitting: Family Medicine

## 2024-01-16 DIAGNOSIS — R053 Chronic cough: Secondary | ICD-10-CM

## 2024-02-06 ENCOUNTER — Telehealth: Payer: Self-pay

## 2024-02-06 ENCOUNTER — Encounter: Payer: Self-pay | Admitting: Hematology

## 2024-02-06 NOTE — Telephone Encounter (Signed)
 Tylene Moats from [facility] on [employer] phone stated that she would like to speak to Dr dartha MA regarding pt upcoming appt with Dr dartha. I called Sheri back she just wanted to make sure that are office regarding pt medical records. I informed sheri that I hadn't seen them, I asked what fax number she faxed the medical records to she said [phone] I informed that was our Admin fax number and provided the clinical fax number [phone]. Sheri said she would refax pt medical records.

## 2024-02-25 ENCOUNTER — Ambulatory Visit (INDEPENDENT_AMBULATORY_CARE_PROVIDER_SITE_OTHER): Admitting: Neurology

## 2024-02-25 ENCOUNTER — Encounter: Payer: Self-pay | Admitting: Neurology

## 2024-02-25 VITALS — BP 115/75 | HR 79 | Ht 65.5 in | Wt 171.0 lb

## 2024-02-25 DIAGNOSIS — G4734 Idiopathic sleep related nonobstructive alveolar hypoventilation: Secondary | ICD-10-CM | POA: Diagnosis not present

## 2024-02-25 DIAGNOSIS — Z8669 Personal history of other diseases of the nervous system and sense organs: Secondary | ICD-10-CM | POA: Insufficient documentation

## 2024-02-25 DIAGNOSIS — G4733 Obstructive sleep apnea (adult) (pediatric): Secondary | ICD-10-CM | POA: Diagnosis not present

## 2024-02-25 DIAGNOSIS — J449 Chronic obstructive pulmonary disease, unspecified: Secondary | ICD-10-CM | POA: Diagnosis not present

## 2024-02-25 DIAGNOSIS — F4310 Post-traumatic stress disorder, unspecified: Secondary | ICD-10-CM | POA: Insufficient documentation

## 2024-02-25 NOTE — Patient Instructions (Addendum)
 ASSESSMENT AND PLAN 78 y.o. year old female patient of Dr Chancy and Dr Magnus, referred  to be re-evaluated for sleep apnea, hypoxia.  History of remote  but heavy smoking, PTSD, and recently 2 episodes of sudden falls, no LOC, he second one with confusion, aphasia. Negative for stroke.   here with:    1)  I would like for the patient to have another sleep study :  a HST is easiest to accomplish but will not address any RLS or PTSD/ Parasomnia.   2)  the patient is in agreement with a HST to be performed first - I will follow up ( or a NP ) with the results if positive.   PTSD is likely affecting CPAP tolerance, and we may use a nasal mask or pillow to treat any apnea we may find.   The patient may still require an in-lab sleep study for any associated sleep symptoms.    I plan to follow up either personally or through our NP within 4 months.
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
## 2024-03-03 ENCOUNTER — Ambulatory Visit: Admitting: Dietician

## 2024-03-03 ENCOUNTER — Encounter

## 2024-03-04 ENCOUNTER — Telehealth: Payer: Self-pay | Admitting: Internal Medicine

## 2024-03-04 NOTE — Telephone Encounter (Signed)
 Call to patient to discuss her concerns about symptoms.   Patient states she has been feeling weak for the past 2 days. She states yesterday she wasn't feeling well and checked her BP. It was reading low-105/60-so she checked it again on another BP cuff she had, and that cuff didn't give her a reading. She states she got nervous and went to her PCP office. She states I'm upset because they told me to go home and rest, I feel like things were not addressed.  She states she also had an episode of dizziness around easter, she went to the [facility] for treatment. ED notes state dizziness improved with meclizine . Incidental finding of pituitary adenoma was documented and she has upcoming appointment with endocrinology.   She states she did rest the remainder of the day. Today when she got up she still felt weak, her bp today is 100/71. HR is 89. She denies leg swelling, SOB or chest pain. However, she does have a cough. She states she has had no sick contacts but she had oral surgery 2 weeks ago and has not been able to take her COPD meds (albuterol /trelegy ellipta inhalers). She also admits her oral intake has been lower than normal. Forwarded to Dr. Acharya for recommendations.

## 2024-03-04 NOTE — Telephone Encounter (Signed)
 Pt c/o BP issue: STAT if pt c/o blurred vision, one-sided weakness or slurred speech.  STAT if BP is GREATER than 180/120 TODAY.  STAT if BP is LESS than 90/60 and SYMPTOMATIC TODAY  1. What is your BP concern? Pt called in stating Jessica Roberts has two bp cuffs at home and when Jessica Roberts went to take her bp there was no reading. Jessica Roberts states Jessica Roberts looked up that if this happens then something is wrong with your heart. Jessica Roberts states Jessica Roberts saw PCP yesterday and they told to go home and get rest. Jessica Roberts states something is wrong with me, I just don't feel well. Please advise.   2. Have you taken any BP medication today? No   3. What are your last 5 BP readings? Today 108/69; yesterday 105/60   4. Are you having any other symptoms (ex. Dizziness, headache, blurred vision, passed out)?  No

## 2024-03-05 NOTE — Telephone Encounter (Signed)
 Spoke with the patient and encouraged her to make sure she is staying hydrated and eating enough. Appointment has been scheduled.

## 2024-03-11 ENCOUNTER — Ambulatory Visit: Admitting: "Endocrinology

## 2024-03-11 ENCOUNTER — Encounter: Payer: Self-pay | Admitting: "Endocrinology

## 2024-03-11 VITALS — BP 122/70 | HR 85 | Ht 65.5 in | Wt 174.0 lb

## 2024-03-11 DIAGNOSIS — D352 Benign neoplasm of pituitary gland: Secondary | ICD-10-CM | POA: Diagnosis not present

## 2024-03-11 DIAGNOSIS — E041 Nontoxic single thyroid nodule: Secondary | ICD-10-CM

## 2024-03-11 NOTE — Progress Notes (Signed)
 Outpatient Endocrinology Note Obadiah Birmingham, MD    Asta Corbridge 11/03/1945 [id number]  Referring Provider: Ruthe Cornet, DO Primary Care Provider: Windy Coy, MD Reason for consultation: Subjective   Assessment & Plan  Diagnoses and all orders for this visit:  Pituitary macroadenoma (HCC) -     ACTH -     Cortisol -     Prolactin -     Luteinizing hormone -     Follicle stimulating hormone -     Estradiol -     T4, free -     TSH -     Growth hormone -     Insulin-like growth factor -     Basic metabolic panel with GFR -     Ambulatory referral to Neurosurgery  Thyroid  nodule -     US  THYROID ; Future   Incidentally found pituitary macroadenoma in 11/2023 MRI brain: The patient has developed a mass filling the sella and bulging into the suprasellar region consistent with a pituitary adenoma. This measures 2.0 x 1.5 x 1.6 cm. Definite cavernous sinus extension is not seen. The mass approaches the optic chiasm but does not definitely compress the optic chiasm. Ordered baseline 8 AM pituitary workup Ordered neurosurgery referral  Incidentally found on 11/2023 CT ANGIOGRAPHY HEAD AND NECK WITH AND WITHOUT CONTRAST: 1.5 cm nodule at the lower pole of the right lobe of the thyroid . No thyroid  nodule identified on palpation/inspection Ordered baseline thyroid  ultrasound   Return in about 27 days (around 04/07/2024) for visit and 8 am labs before next visit.   I have reviewed current medications, nurse's notes, allergies, vital signs, past medical and surgical history, family medical history, and social history for this encounter. Counseled patient on symptoms, examination findings, lab findings, imaging results, treatment decisions and monitoring and prognosis. The patient understood the recommendations and agrees with the treatment plan. All questions regarding treatment plan were fully answered.  Obadiah Birmingham, MD  03/11/24   History of Present  Illness HPI  Jessica Roberts is a 78 y.o. year old female who presents for evaluation of pituitary macroadenoma found incidentally on brain MRI done in 11/2023.  11/30/23 MRI brain WO contrast: The patient has developed a mass filling the sella and bulging into the suprasellar region consistent with a pituitary adenoma. This measures 2.0 x 1.5 x 1.6 cm. Definite cavernous sinus extension is not seen. The mass approaches the optic chiasm but does not definitely compress the optic chiasm.  She reports the following;  Headaches Yes visual blurring/ diplopia/ fields defect No Galactorrhea No  change in facial appearance  No body habitus No change in her hand, ring, hat, shoe size  No hyperhidrosis No arthralgias No, baseline left knee and L ankle pain since 6-8 weeks   fatigue Yes weight change Yes, on ozempic, lot about 10 lbs  change in appetite Yes, since on ozempic  heat/cold intolerance Yes, subtle  change in bowel movements Yes, constipation  change in muscle strength No changes in skin or hair No Palpitations No insomnia No tremor No  moon faces No fat pads Yes increased girth  Yes plethora hyperpigmentation No purple striae No acne No vellus/terminal hirsutism No proximal muscle weakness No nausea/vomiting No lightheadedness Yes abdominal pain No  hot flashes No night sweats No  Family history is negative for pituitary tumor or other abnormalities concerning for MEN syndrome.  Physical Exam  BP 122/70   Pulse 85   Ht 5' 5.5 (1.664 m)  Wt 174 lb (78.9 kg)   SpO2 96%   BMI 28.51 kg/m    Constitutional: well developed, well nourished Head: normocephalic, atraumatic Eyes: sclera anicteric, no redness Neck: supple, no thyromegaly/thyroid  tenderness/thyroid  nodules palpated Lungs: normal respiratory effort Neurology: alert and oriented Skin: dry, no appreciable rashes Musculoskeletal: no appreciable defects Psychiatric: normal mood and affect    Current Medications Patient's Medications  New Prescriptions   No medications on file  Previous Medications   ACETAMINOPHEN  (TYLENOL ) 500 MG TABLET    Take 500 mg by mouth 2 (two) times daily as needed for moderate pain (pain). Reported on 10/12/2015   ALBUTEROL  (PROVENTIL  HFA;VENTOLIN  HFA) 108 (90 BASE) MCG/ACT INHALER    Inhale 1 puff into the lungs every 6 (six) hours as needed for wheezing or shortness of breath.   AMOXICILLIN  (AMOXIL ) 500 MG CAPSULE    Take 500 mg by mouth 3 (three) times daily.   BUPROPION ER (WELLBUTRIN SR) 100 MG 12 HR TABLET    Take 100 mg by mouth 2 (two) times daily.   DIAZEPAM (VALIUM) 5 MG TABLET    Take 5 mg by mouth every 6 (six) hours as needed for anxiety. As needed   DULOXETINE (CYMBALTA) 20 MG CAPSULE    Take 20 mg by mouth daily.   EZETIMIBE (ZETIA) 10 MG TABLET    Take 10 mg by mouth daily.   METRONIDAZOLE (METROGEL) 0.75 % GEL    metronidazole 0.75 % topical gel   PANTOPRAZOLE  (PROTONIX ) 40 MG TABLET    Take 1 tablet every day by oral route.   PRAZOSIN (MINIPRESS) 2 MG CAPSULE    Take 2 mg by mouth at bedtime.   ROSUVASTATIN (CRESTOR) 40 MG TABLET    Take 40 mg by mouth daily.   SUMATRIPTAN (IMITREX) 100 MG TABLET    Take by mouth. As needed for Migraine   TRAZODONE (DESYREL) 50 MG TABLET       TRELEGY ELLIPTA 200-62.5-25 MCG/ACT AEPB    Inhale 1 puff into the lungs daily.  Modified Medications   No medications on file  Discontinued Medications   No medications on file    Allergies Allergies  Allergen Reactions   Codeine Other (See Comments)    Nervous system dysfunction, cannot control or use limbs   Oxycontin  [Oxycodone  Hcl] Other (See Comments)   Percocet [Oxycodone -Acetaminophen ] Nausea And Vomiting   Percodan [Oxycodone -Aspirin ] Nausea And Vomiting    Past Medical History Past Medical History:  Diagnosis Date   Allergy    Anxiety    Anxiety and depression    Breast cancer (HCC) 04/07/15   right breast   Breast cancer of upper-outer  quadrant of right female breast (HCC) 04/11/2015   Broken nose    Complication of anesthesia    had a very hard time waking up   Dengue fever    Depression    Fall    broken nose   GERD (gastroesophageal reflux disease)    Head injury 1995   concussion in [place], [place] evac to [place] , ear damage with vertigo   Head injury, acute, with loss of consciousness (HCC) [place]   Head [place], closed, with concussion 1986   [place]   Headache    Hypertension    Malaria    PONV (postoperative nausea and vomiting)    Typhoid     Past Surgical History Past Surgical History:  Procedure Laterality Date   BREAST LUMPECTOMY Right 04/27/2015   BREAST  LUMPECTOMY WITH NEEDLE LOCALIZATION AND AXILLARY SENTINEL LYMPH NODE BX Right 04/27/2015   Procedure: RIGHT BREAST LUMPECTOMY WITH  TWO NEEDLE LOCALIZATION AND TWO RIGHT AXILLARY SENTINEL LYMPH NODE BX;  Surgeon: Elon Pacini, MD;  Location: [facility] OR;  Service: General;  Laterality: Right;   COLONOSCOPY W/ BIOPSIES AND POLYPECTOMY     NERVE SURGERY     '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' during surgery for endometriosis   surgical procedure for endometriosis     TONSILLECTOMY      Family History family history includes Alcohol abuse in her paternal grandfather; Breast cancer in her mother; Diabetes in her maternal grandmother; Emphysema in her father; Heart disease in her paternal grandmother; Hypertension in her mother; Thyroid  disease in her mother.  Social History Social History   Socioeconomic History   Marital status: Divorced    Spouse name: Not on file   Number of children: 0   Years of education: BA   Highest education level: Not on file  Occupational History    Employer: [employer]    Comment: [employer]  Tobacco Use   Smoking status: Former    Current packs/day: 0.00    Average packs/day: 1.5 packs/day for 30.0 years (45.0 ttl pk-yrs)    Types: Cigarettes    Start date: 01/12/1964    Quit date: 01/11/1994    Years since  quitting: 30.1   Smokeless tobacco: Never  Substance and Sexual Activity   Alcohol use: Yes    Alcohol/week: 4.0 standard drinks of alcohol    Types: 4 Glasses of wine per week    Comment: moderate    Drug use: No   Sexual activity: Never    Birth control/protection: None  Other Topics Concern   Not on file  Social History Narrative   Patient lives at home with her friend.   Caffeine Use: 1/2 cup daily   Social Drivers of Health   Financial Resource Strain: Not on file  Food Insecurity: No Food Insecurity (09/03/2023)   Hunger Vital Sign    Worried About Running Out of Food in the Last Year: Never true    Ran Out of Food in the Last Year: Never true  Transportation Needs: Not on file  Physical Activity: Not on file  Stress: Not on file  Social Connections: Unknown (12/26/2021)   Received from [employer]   Social Network    Social Network: Not on file  Intimate Partner Violence: Unknown (11/17/2021)   Received from [facility]   HITS    Physically Hurt: Not on file    Insult or Talk Down To: Not on file    Threaten Physical Harm: Not on file    Scream or Curse: Not on file    No results found for: CHOL No results found for: HDL No results found for: LDLCALC No results found for: TRIG No results found for: [facility] Lab Results  Component Value Date   CREATININE 0.99 12/17/2023   No results found for: GFR    Component Value Date/Time   NA 140 12/17/2023 1014   NA 141 04/01/2017 1523   K 3.9 12/17/2023 1014   K 3.8 04/01/2017 1523   CL 107 12/17/2023 1014   CO2 27 12/17/2023 1014   CO2 25 04/01/2017 1523   GLUCOSE 151 (H) 12/17/2023 1014   GLUCOSE 120 04/01/2017 1523   BUN 12 12/17/2023 1014   BUN 13.6 04/01/2017 1523   CREATININE 0.99 12/17/2023 1014   CREATININE 0.8 04/01/2017 1523   CALCIUM   9.3 12/17/2023 1014   CALCIUM  9.5 04/01/2017 1523   PROT 7.3 12/17/2023 1014   PROT 7.3 04/01/2017 1523   ALBUMIN 4.2 12/17/2023 1014   ALBUMIN 3.5  04/01/2017 1523   AST 25 12/17/2023 1014   AST 16 04/01/2017 1523   ALT 23 12/17/2023 1014   ALT 21 04/01/2017 1523   ALKPHOS 84 12/17/2023 1014   ALKPHOS 93 04/01/2017 1523   BILITOT 0.6 12/17/2023 1014   BILITOT <0.22 04/01/2017 1523   GFRNONAA 59 (L) 12/17/2023 1014   GFRAA >60 04/19/2020 1114      Latest Ref Rng & Units 12/17/2023   10:14 AM 11/30/2023   11:01 AM 11/30/2023   10:58 AM  BMP  Glucose 70 - 99 mg/dL 848  816  812   BUN 8 - 23 mg/dL 12  14  14    Creatinine 0.44 - 1.00 mg/dL 9.00  9.39  9.29   Sodium 135 - 145 mmol/L 140  136  137   Potassium 3.5 - 5.1 mmol/L 3.9  4.0  4.0   Chloride 98 - 111 mmol/L 107  103  103   CO2 22 - 32 mmol/L 27   23   Calcium  8.9 - 10.3 mg/dL 9.3   9.5        Component Value Date/Time   WBC 8.1 12/17/2023 1014   WBC 9.0 11/30/2023 1058   RBC 4.22 12/17/2023 1014   HGB 12.6 12/17/2023 1014   HGB 12.2 04/01/2017 1523   HCT 37.1 12/17/2023 1014   HCT 38.0 04/01/2017 1523   PLT 295 12/17/2023 1014   PLT 326 04/01/2017 1523   MCV 87.9 12/17/2023 1014   MCV 90.0 04/01/2017 1523   MCH 29.9 12/17/2023 1014   MCHC 34.0 12/17/2023 1014   RDW 13.9 12/17/2023 1014   RDW 14.7 (H) 04/01/2017 1523   LYMPHSABS 1.7 12/17/2023 1014   LYMPHSABS 2.1 04/01/2017 1523   MONOABS 0.6 12/17/2023 1014   MONOABS 0.7 04/01/2017 1523   EOSABS 0.2 12/17/2023 1014   EOSABS 0.4 04/01/2017 1523   BASOSABS 0.1 12/17/2023 1014   BASOSABS 0.1 04/01/2017 1523   No results found for: TSH, FREET4       Parts of this note may have been dictated using voice recognition software. There may be variances in spelling and vocabulary which are unintentional. Not all errors are proofread. Please notify the dino if any discrepancies are noted or if the meaning of any statement is not clear.

## 2024-03-12 ENCOUNTER — Ambulatory Visit
Admission: RE | Admit: 2024-03-12 | Discharge: 2024-03-12 | Disposition: A | Discharge status: Home / Self Care | Source: Ambulatory Visit | Attending: "Endocrinology | Admitting: "Endocrinology

## 2024-03-12 DIAGNOSIS — E041 Nontoxic single thyroid nodule: Secondary | ICD-10-CM

## 2024-03-16 DIAGNOSIS — K219 Gastro-esophageal reflux disease without esophagitis: Secondary | ICD-10-CM | POA: Insufficient documentation

## 2024-03-16 DIAGNOSIS — I2510 Atherosclerotic heart disease of native coronary artery without angina pectoris: Secondary | ICD-10-CM | POA: Insufficient documentation

## 2024-03-16 DIAGNOSIS — G4733 Obstructive sleep apnea (adult) (pediatric): Secondary | ICD-10-CM | POA: Insufficient documentation

## 2024-03-16 DIAGNOSIS — K921 Melena: Secondary | ICD-10-CM | POA: Insufficient documentation

## 2024-03-16 DIAGNOSIS — F4310 Post-traumatic stress disorder, unspecified: Secondary | ICD-10-CM | POA: Insufficient documentation

## 2024-03-16 DIAGNOSIS — J45909 Unspecified asthma, uncomplicated: Secondary | ICD-10-CM | POA: Insufficient documentation

## 2024-03-16 DIAGNOSIS — D509 Iron deficiency anemia, unspecified: Secondary | ICD-10-CM | POA: Insufficient documentation

## 2024-03-16 DIAGNOSIS — Z860100 Personal history of colon polyps, unspecified: Secondary | ICD-10-CM | POA: Insufficient documentation

## 2024-03-16 DIAGNOSIS — R400 Somnolence: Secondary | ICD-10-CM | POA: Insufficient documentation

## 2024-03-16 DIAGNOSIS — M85859 Other specified disorders of bone density and structure, unspecified thigh: Secondary | ICD-10-CM | POA: Insufficient documentation

## 2024-03-16 DIAGNOSIS — E559 Vitamin D deficiency, unspecified: Secondary | ICD-10-CM | POA: Insufficient documentation

## 2024-03-16 DIAGNOSIS — G478 Other sleep disorders: Secondary | ICD-10-CM | POA: Insufficient documentation

## 2024-03-16 DIAGNOSIS — K5730 Diverticulosis of large intestine without perforation or abscess without bleeding: Secondary | ICD-10-CM | POA: Insufficient documentation

## 2024-03-16 DIAGNOSIS — G479 Sleep disorder, unspecified: Secondary | ICD-10-CM | POA: Insufficient documentation

## 2024-03-16 DIAGNOSIS — F32A Depression, unspecified: Secondary | ICD-10-CM | POA: Insufficient documentation

## 2024-03-16 DIAGNOSIS — K5900 Constipation, unspecified: Secondary | ICD-10-CM | POA: Insufficient documentation

## 2024-03-16 DIAGNOSIS — C50919 Malignant neoplasm of unspecified site of unspecified female breast: Secondary | ICD-10-CM | POA: Insufficient documentation

## 2024-03-16 DIAGNOSIS — J449 Chronic obstructive pulmonary disease, unspecified: Secondary | ICD-10-CM | POA: Insufficient documentation

## 2024-03-16 DIAGNOSIS — J42 Unspecified chronic bronchitis: Secondary | ICD-10-CM | POA: Insufficient documentation

## 2024-03-16 DIAGNOSIS — F339 Major depressive disorder, recurrent, unspecified: Secondary | ICD-10-CM | POA: Insufficient documentation

## 2024-03-16 DIAGNOSIS — J309 Allergic rhinitis, unspecified: Secondary | ICD-10-CM | POA: Insufficient documentation

## 2024-03-16 DIAGNOSIS — F419 Anxiety disorder, unspecified: Secondary | ICD-10-CM | POA: Insufficient documentation

## 2024-03-24 ENCOUNTER — Other Ambulatory Visit

## 2024-03-25 NOTE — Progress Notes (Signed)
 Cardiology Office Note:  .   Date:  03/26/2024  ID:  Jessica Roberts, DOB [birth year], MRN [id number] PCP: Windy Coy, MD  [facility] Providers Cardiologist:  Soyla DELENA Merck, MD {  History of Present Illness: .   Jessica Roberts is a 78 y.o. female with history of CAD noted on coronary CTA 06/2020, OSA, diabetes, depression and PTSD, pituitary macroadenoma followed by endocrinology     CAD Coronary CTA 06/2020 with stenosis in proximal OM 1 branch.  FFR showed tapering flow limitations into the LAD.  EF normal.  Social history  Works out at senior center Quit smoking in 1995     Patient with history of CAD noted on CTA with proximal OM1 branch disease with that was flow-limiting into the LAD.  Has been asymptomatic from the standpoint so no further evaluation has been done.  She was seen last 07/2023 for preoperative evaluation for hysterectomy D&C.  Stable from a cardiac perspective with no complaints.  She was really struggling with depression and PTSD.  She has recent ER visit for/2025 where she had complaints of feeling weak all over, dizziness and was having itching.  There was no evidence of an acute CVA.  However she did have incidental finding of pituitary adenoma.  Symptoms of dizziness improved with meclizine .  She saw endocrinology and eventually got referred to neurosurgery as she had extending mass that was extending to the optic chiasm but not compressing it.  Then patient recently contacted our office with complaints of hypotension, BP 105/60 and called for an appointment.  Today patient reports that she had an event where she was attempting to check her blood pressure however was unable to get a reading.  She reported only had felt sleepy and a little bit fatigued during this period.  Her neighbor/friend came over and they were able to get blood pressure readings however she still was not able to and this concerned her.  She also reported a previous event  of this happening several months ago where her blood pressure was low.  Otherwise these were the only 2 events that she notes.  She did not provide a blood pressure log but reports she checks her blood pressure every day and this is generally around 120.  Otherwise she is without any acute complaints.  Additionally she reports she struggles with her depression and was going to the senior center and working out twice a week however has been skipping this but with the encouragement of her friend she is going to start going once again.  ROS: Denies: Chest pain, shortness of breath, orthopnea, peripheral edema, palpitations, decreased exercise intolerance, fatigue, lightheadedness.   Studies Reviewed: .         Risk Assessment/Calculations:             Physical Exam:   VS:  BP 130/85   Pulse 72   Ht 5' 5.5 (1.664 m)   Wt 177 lb 11.2 oz (80.6 kg)   SpO2 96%   BMI 29.12 kg/m    Wt Readings from Last 3 Encounters:  03/26/24 177 lb 11.2 oz (80.6 kg)  03/11/24 174 lb (78.9 kg)  02/25/24 171 lb (77.6 kg)    GEN: Well nourished, well developed in no acute distress NECK: No JVD; No carotid bruits CARDIAC: RRR, no murmurs, rubs, gallops RESPIRATORY:  Clear to auscultation without rales, wheezing or rhonchi  ABDOMEN: Soft, non-tender, non-distended EXTREMITIES:  No edema; No deformity   ASSESSMENT AND PLAN: .  Reported hypotension Patient reports an isolated episode of not being able to get a blood pressure from her wrist cuff.  She is very adamant that there is absolutely nothing wrong with her cuff.  Only reports 1 other recurrence of this several months ago.  She did have accompanying symptoms of feeling tired/fatigued but has not had any recurrences.  Did not pass out or lose consciousness.  She checks her blood pressure daily and this is very consistently 120.  Blood pressure today is 130/85.  She is not on any medications that I see that would drop her blood pressure sporadically.  I  encouraged her to continue to prevent other obvious secondary causes such as dehydration and poor p.o. intake.  I also recommended getting arm cuff that is likely going to be more accurate.  CAD Prior coronary CTA in 06/2020 with proximal OM1 branch disease tapering flow limitation into the LAD.  Echocardiogram was normal.  She has been asymptomatic so no further workup indicated. Ideally would be on aspirin , she reports PCP had stopped this for a very good reason in the past.  Asked her to follow-up with him about what that reason is and to start if able to do so. Continue with rosuvastatin 40 mg and Zetia.  I do not have access to any lipid panel but she assures me that this is being well-managed by her PCP and would defer to him.  OSA Previously declined sleep study however she now has 1 pending through neurology.  Macroadenoma Found incidentally, followed by endocrinology with extending mass into the optic chiasm.  She will be seeing neurosurgery very soon.  Depression and PTSD Followed by psychiatry  Type 2 diabetes Reports she has been newly diagnosed with this and started on Ozempic.       Dispo: Follow-up in 1 year with Dr. Loni  Signed, Thom LITTIE Sluder, PA-C

## 2024-03-26 ENCOUNTER — Ambulatory Visit: Attending: General Practice | Admitting: Cardiology

## 2024-03-26 VITALS — BP 130/85 | HR 72 | Ht 65.5 in | Wt 177.7 lb

## 2024-03-26 DIAGNOSIS — E118 Type 2 diabetes mellitus with unspecified complications: Secondary | ICD-10-CM

## 2024-03-26 DIAGNOSIS — E782 Mixed hyperlipidemia: Secondary | ICD-10-CM

## 2024-03-26 DIAGNOSIS — I2510 Atherosclerotic heart disease of native coronary artery without angina pectoris: Secondary | ICD-10-CM | POA: Diagnosis not present

## 2024-03-26 DIAGNOSIS — I9589 Other hypotension: Secondary | ICD-10-CM | POA: Diagnosis not present

## 2024-03-26 NOTE — Patient Instructions (Signed)
 Medication Instructions:  Your physician recommends that you continue on your current medications as directed. Please refer to the Current Medication list given to you today.  *If you need a refill on your cardiac medications before your next appointment, please call your pharmacy*  Lab Work: NONE If you have labs (blood work) drawn today and your tests are completely normal, you will receive your results only by: MyChart Message (if you have MyChart) OR A paper copy in the mail If you have any lab test that is abnormal or we need to change your treatment, we will call you to review the results.  Testing/Procedures: NONE  Follow-Up: At [facility], you and your health needs are our priority.  As part of our continuing mission to provide you with exceptional heart care, our providers are all part of one team.  This team includes your primary Cardiologist (physician) and Advanced Practice Providers or APPs (Physician Assistants and Nurse Practitioners) who all work together to provide you with the care you need, when you need it.  Your next appointment:   1 year(s)  Provider:   Gayatri A Acharya, MD

## 2024-03-27 ENCOUNTER — Other Ambulatory Visit

## 2024-03-27 ENCOUNTER — Encounter: Payer: Self-pay | Admitting: Neurosurgery

## 2024-03-27 ENCOUNTER — Ambulatory Visit (INDEPENDENT_AMBULATORY_CARE_PROVIDER_SITE_OTHER): Admitting: Neurosurgery

## 2024-03-27 VITALS — BP 129/81 | HR 85 | Ht 65.0 in | Wt 178.0 lb

## 2024-03-27 DIAGNOSIS — Z853 Personal history of malignant neoplasm of breast: Secondary | ICD-10-CM

## 2024-03-27 DIAGNOSIS — D443 Neoplasm of uncertain behavior of pituitary gland: Secondary | ICD-10-CM | POA: Diagnosis not present

## 2024-03-27 DIAGNOSIS — D352 Benign neoplasm of pituitary gland: Secondary | ICD-10-CM

## 2024-03-27 NOTE — Progress Notes (Unsigned)
 Assessment : 78 year old lady with a history of right breast lumpectomy for breast cancer, COPD who over the past few months had 2 episodes of losing control of her legs and collapsing.  Both times, she did not have any loss of consciousness.  She went to the emergency room where workup was done for stroke and a pituitary tumor was found.  She was sent to ophthalmology to Dr. Eyvonne and according to her a visual field examination was done which was normal.  She also was seen by cardiology and neurology and no major abnormalities were found.  Patient was referred to Dr. Dartha, endocrinologist, who had lab work done and patient only had this drawn this morning so we do not have the result.  She was also referred to me for evaluation.  Patient says that she is doing very well.  She has had headaches for many years and but otherwise does not notice any visual problems.  She is doing very well and has a very good quality of life.  She is taking some antibiotics but did not remember for what reason.  Plan : I reviewed the images with her and discussed the physiology of pituitary gland as well as the pathophysiology of pituitary tumors.  I am very encouraged by the fact that her visual exam was normal but I requested her to bring the eye exam with her the next time she comes and sees me so we can uploaded into our system.  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  I would typically get an MRI in 1 year for follow-up but with her history of breast cancer, I would like to rule out that this is metastasis, however unlikely this might be, and given the fact that she had a previous MRI done in April, we will get one done now and I will see her back in a few weeks.  She is comfortable with this plan and I will see her then.  She pledged to bring the MRI report with her at  this visit.   Social History   Socioeconomic History   Marital status: Divorced    Spouse name: Not on file   Number of children: 0   Years of education: BA   Highest education level: Not on file  Occupational History    Employer: [employer]    Comment: [employer]  Tobacco Use   Smoking status: Former    Current packs/day: 0.00    Average packs/day: 1.5 packs/day for 30.0 years (45.0 ttl pk-yrs)    Types: Cigarettes    Start date: 01/12/1964    Quit date: 01/11/1994    Years since quitting: 30.2   Smokeless tobacco: Never  Substance and Sexual Activity   Alcohol use: Yes    Alcohol/week: 4.0 standard drinks of alcohol    Types: 4 Glasses of wine per week    Comment: moderate    Drug use: No   Sexual activity: Never    Birth control/protection: None  Other Topics Concern   Not on file  Social History Narrative   Patient lives at home with her friend.   Caffeine Use: 1/2 cup daily   Social Drivers of Health   Financial Resource Strain: Not on file  Food Insecurity: No Food Insecurity (09/03/2023)   Hunger Vital Sign    Worried About  Running Out of Food in the Last Year: Never true    Ran Out of Food in the Last Year: Never true  Transportation Needs: Not on file  Physical Activity: Not on file  Stress: Not on file  Social Connections: Unknown (12/26/2021)   Received from [facility]   Social Network    Social Network: Not on file  Intimate Partner Violence: Unknown (11/17/2021)   Received from [facility]   HITS    Physically Hurt: Not on file    Insult or Talk Down To: Not on file    Threaten Physical Harm: Not on file    Scream or Curse: Not on file    Family History  Problem Relation Age of Onset   Hypertension Mother    Thyroid  disease Mother    Breast cancer Mother    Emphysema Father    Diabetes Maternal Grandmother    Heart disease Paternal Grandmother    Alcohol abuse Paternal Grandfather     Allergies  Allergen Reactions   Codeine Other (See Comments)     Nervous system dysfunction, cannot control or use limbs   Oxycodone  Nausea And Vomiting and Nausea Only   Oxycontin  [Oxycodone  Hcl] Other (See Comments)   Percocet [Oxycodone -Acetaminophen ] Nausea And Vomiting   Percodan [Oxycodone -Aspirin ] Nausea And Vomiting    Past Medical History:  Diagnosis Date   Allergy    Anxiety    Anxiety and depression    Breast cancer (HCC) 04/07/15   right breast   Breast cancer of upper-outer quadrant of right female breast (HCC) 04/11/2015   Broken nose    Complication of anesthesia    had a very hard time waking up   Dengue fever    Depression    Fall    broken nose   GERD (gastroesophageal reflux disease)    Head injury 1995   concussion in [place], med evac to [place] , ear damage with vertigo   Head [place], acute, with loss of consciousness (HCC) [place]   Head injury, closed, with concussion 1986   in [place]   Headache    Hypertension    Malaria    PONV (postoperative nausea and vomiting)    Typhoid     Past Surgical History:  Procedure Laterality Date   BREAST LUMPECTOMY Right 04/27/2015   BREAST LUMPECTOMY WITH NEEDLE LOCALIZATION AND AXILLARY SENTINEL LYMPH NODE BX Right 04/27/2015   Procedure: RIGHT BREAST LUMPECTOMY WITH  TWO NEEDLE LOCALIZATION AND TWO RIGHT AXILLARY SENTINEL LYMPH NODE BX;  Surgeon: Elon Pacini, MD;  Location: [facility] OR;  Service: General;  Laterality: Right;   COLONOSCOPY W/ BIOPSIES AND POLYPECTOMY     NERVE SURGERY     '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' during surgery for endometriosis   surgical procedure for endometriosis     TONSILLECTOMY       Physical Exam HENT:     Head: Normocephalic.     Nose: Nose normal.  Eyes:     Pupils: Pupils are equal, round, and reactive to light.  Cardiovascular:     Rate and Rhythm: Normal rate.  Pulmonary:     Effort: Pulmonary effort is normal.  Abdominal:     General: Abdomen is flat.  Musculoskeletal:     Cervical back: Normal range of  motion.  Neurological:     Mental Status: She is alert.     Cranial Nerves: Cranial nerves 2-12 are intact.     Sensory: Sensation is intact.  Motor: Motor function is intact.     Coordination: Coordination is intact.        Results for orders placed or performed during the hospital encounter of 11/30/23  MR BRAIN WO CONTRAST   Narrative   CLINICAL DATA:  Neuro deficit, acute, stroke suspected.  Aphasia.  EXAM: MRI HEAD WITHOUT CONTRAST  TECHNIQUE: Multiplanar, multiecho pulse sequences of the brain and surrounding structures were obtained without intravenous contrast.  COMPARISON:  Head CT earlier today.  MRI 09/04/2013  FINDINGS: Brain: Diffusion imaging does not show any acute or subacute infarction or other cause of restricted diffusion. There are extensive chronic small-vessel ischemic changes throughout the pons. No focal cerebellar finding. Cerebral hemispheres show chronic small-vessel ischemic changes of the thalami, basal ganglia and throughout the cerebral hemispheric white matter. No large vessel stroke. Focus of hemosiderin deposition in the left parieto-occipital white matter consistent with an old hemorrhagic small vessel stroke in that location. This was not present in 2015. The findings in general are progressive over time. No evidence of neoplastic mass, hydrocephalus or extra-axial collection.  The patient has developed a mass filling the sella and bulging into the suprasellar region consistent with a pituitary adenoma. This measures 2.0 x 1.5 x 1.6 cm. Definite cavernous sinus extension is not seen. The mass approaches the optic chiasm but does not definitely compress the optic chiasm.  Vascular: Major vessels at the base of the brain show flow.  Skull and upper cervical spine: Negative  Sinuses/Orbits: Acute right maxillary rhinosinusitis. Mucosal inflammatory changes elsewhere within the sinuses as well. Orbits negative.  Other:  None  IMPRESSION: 1. No acute brain finding. Extensive chronic small-vessel ischemic changes of the pons, thalami, basal ganglia and cerebral hemispheric white matter. Focus of hemosiderin deposition in the left parieto-occipital white matter consistent with an old hemorrhagic small vessel stroke in that location. The findings in general are progressive over time. 2. 2.0 x 1.5 x 1.6 cm mass filling the sella and bulging into the suprasellar region consistent with a pituitary adenoma. Definite cavernous sinus extension is not seen. The mass approaches the optic chiasm but does not definitely compress the optic chiasm. 3. Acute right maxillary rhinosinusitis. Mucosal inflammatory changes elsewhere within the sinuses as well.   Electronically Signed   By: Oneil Officer M.D.   On: 11/30/2023 15:03   Results for orders placed or performed during the hospital encounter of 09/04/13  MR Brain W Wo Contrast   Narrative   CLINICAL DATA:  Memory loss. History of closed head injury from fall 06/26/2013.  EXAM: MRI HEAD WITHOUT AND WITH CONTRAST  TECHNIQUE: Multiplanar, multiecho pulse sequences of the brain and surrounding structures were obtained without and with intravenous contrast.  CONTRAST:  15mL MULTIHANCE  GADOBENATE DIMEGLUMINE  529 MG/ML IV SOLN  COMPARISON:  CT head 06/26/2013. No prior MRI available for comparison.  FINDINGS: Ventricle size is normal. No shift to midline structures. Pituitary is prominent measuring 8 mm in height.  Multiple white matter lesions are identified. Some of these are periventricular in orientation and could be seen with demyelinating disease. There also are deep white matter lesions. The largest lesions are in the right frontal and left parietal white matter. No cortical lesions. Brainstem and cerebellum are intact.  Diffusion-weighted imaging is negative.  No acute infarct.  Postcontrast imaging reveals normal enhancement. No enhancing  white matter lesion or mass lesion is identified.  IMPRESSION: Moderately advanced periventricular and deep white matter disease bilaterally. This pattern could be seen with multiple  sclerosis. Chronic microvascular ischemia is also possible.  Negative for acute infarct or mass.  Normal enhancement.  Pituitary is prominent in size measuring 8 mm.   Electronically Signed   By: Carlin Gaskins M.D.   On: 09/04/2013 17:18   Results for orders placed or performed during the hospital encounter of 06/26/13  CT Head Wo Contrast   Narrative   CLINICAL DATA:  Facial trauma status post fall.  EXAM: CT HEAD WITHOUT CONTRAST  CT MAXILLOFACIAL WITHOUT CONTRAST  TECHNIQUE: Multidetector CT imaging of the head and maxillofacial structures were performed using the standard protocol without intravenous contrast. Multiplanar CT image reconstructions of the maxillofacial structures were also generated.  COMPARISON:  None.  FINDINGS: CT HEAD FINDINGS  There is no evidence of mass effect, midline shift, or extra-axial fluid collections. There is no evidence of a space-occupying lesion or intracranial hemorrhage. There is no evidence of a cortical-based area of acute infarction. There is generalized cerebral atrophy. There is periventricular white matter low attenuation likely secondary to microangiopathy.  The ventricles and sulci are appropriate for the patient's age. The basal cisterns are patent.  Visualized portions of the orbits are unremarkable. There is a mucous retention cyst in the right maxillary sinus. Cerebrovascular atherosclerotic calcifications are noted.  The osseous structures are unremarkable.  CT MAXILLOFACIAL FINDINGS  The globes are intact. The orbital walls are intact. The orbital floors are intact. The maxilla is intact. The mandible is intact. The zygomatic arches are intact. The nasal septum is deviated towards the left. There are bilateral comminuted  nasal bone fractures. The temporomandibular joints are normal.  There is a right maxillary sinus mucous retention cyst. The visualized portions of the mastoid sinuses are well aerated.  IMPRESSION: 1.  No acute intracranial pathology.  2.  Bilateral mildly comminuted nasal bone fractures.   Electronically Signed   By: Julaine Blanch   On: 06/26/2013 16:34

## 2024-03-31 LAB — INSULIN-LIKE GROWTH FACTOR
IGF-I, LC/MS: 100 ng/mL (ref 34–245)
Z-Score (Female): 0 {STDV} (ref ?–2.0)

## 2024-03-31 LAB — FOLLICLE STIMULATING HORMONE: FSH: 4.3 m[IU]/mL

## 2024-03-31 LAB — BASIC METABOLIC PANEL WITH GFR
BUN: 12 mg/dL (ref 7–25)
CO2: 26 mmol/L (ref 20–32)
Calcium: 8.8 mg/dL (ref 8.6–10.4)
Chloride: 108 mmol/L (ref 98–110)
Creat: 0.72 mg/dL (ref 0.60–1.00)
Glucose, Bld: 115 mg/dL — ABNORMAL HIGH (ref 65–99)
Potassium: 3.9 mmol/L (ref 3.5–5.3)
Sodium: 140 mmol/L (ref 135–146)
eGFR: 87 mL/min/1.73m2 (ref >=60)

## 2024-03-31 LAB — ACTH: C206 ACTH: 18 pg/mL (ref 6–50)

## 2024-03-31 LAB — PROLACTIN: Prolactin: 42.7 ng/mL — ABNORMAL HIGH

## 2024-03-31 LAB — ESTRADIOL: Estradiol: 31 pg/mL

## 2024-03-31 LAB — T4, FREE: Free T4: 0.5 ng/dL — ABNORMAL LOW (ref 0.8–1.8)

## 2024-03-31 LAB — LUTEINIZING HORMONE: LH: 0.6 m[IU]/mL

## 2024-03-31 LAB — GROWTH HORMONE: Growth Hormone: 0.1 ng/mL (ref <=7.1)

## 2024-03-31 LAB — TSH: TSH: 3.94 m[IU]/L (ref 0.40–4.50)

## 2024-03-31 LAB — CORTISOL: Cortisol, Plasma: 15.9 ug/dL

## 2024-04-07 ENCOUNTER — Encounter: Payer: Self-pay | Admitting: "Endocrinology

## 2024-04-07 ENCOUNTER — Ambulatory Visit (INDEPENDENT_AMBULATORY_CARE_PROVIDER_SITE_OTHER): Admitting: "Endocrinology

## 2024-04-07 VITALS — BP 120/72 | HR 74 | Ht 65.0 in | Wt 178.8 lb

## 2024-04-07 DIAGNOSIS — D352 Benign neoplasm of pituitary gland: Secondary | ICD-10-CM | POA: Diagnosis not present

## 2024-04-07 DIAGNOSIS — E041 Nontoxic single thyroid nodule: Secondary | ICD-10-CM

## 2024-04-07 NOTE — Progress Notes (Signed)
 Outpatient Endocrinology Note Obadiah Birmingham, MD    Jessica Roberts Sep 13, 1945 [id number]  Referring Provider: Windy Coy, MD Primary Care Provider: Windy Coy, MD Reason for consultation: Subjective   Assessment & Plan  Diagnoses and all orders for this visit:  Thyroid  nodule -     US  FNA BX THYROID  1ST LESION AFIRMA; Future -     US  FNA BX THYROID  1ST LESION AFIRMA  Pituitary macroadenoma (HCC) -     TSH -     T4, free -     Prolactin; Future   Incidentally found pituitary macroadenoma in 11/2023 MRI brain: The patient has developed a mass filling the sella and bulging into the suprasellar region consistent with a pituitary adenoma. This measures 2.0 x 1.5 x 1.6 cm. Definite cavernous sinus extension is not seen. The mass approaches the optic chiasm but does not definitely compress the optic chiasm. 03/27/24: baseline 8 AM pituitary workup showed mildly high prolactin, likely from stalk effect and low FT4 with normal TSH, the utility of which is unclear. Will repeat the labs next visit. No treatment indicated at this time.   Saw Dr Rosslyn Neurosurgeon, pending repeat MRI   Incidentally found on 11/2023 CT ANGIOGRAPHY HEAD AND NECK WITH AND WITHOUT CONTRAST: 1.5 cm nodule at the lower pole of the right lobe of the thyroid . 03/12/24: Baseline thyroid  ultrasound: TR3 Nodule # 1: Location: RIGHT; inferior 2.5 cm x 1.7 x 1.6 cm: ordered FNA   Return in about 3 months (around 07/08/2024) for visit and 8 am labs before next visit.   I have reviewed current medications, nurse's notes, allergies, vital signs, past medical and surgical history, family medical history, and social history for this encounter. Counseled patient on symptoms, examination findings, lab findings, imaging results, treatment decisions and monitoring and prognosis. The patient understood the recommendations and agrees with the treatment plan. All questions regarding treatment plan were fully  answered.  Obadiah Birmingham, MD  04/07/24   History of Present Illness HPI  Jessica Roberts is a 78 y.o. year old female who presents for evaluation of pituitary macroadenoma found incidentally on brain MRI done in 11/2023.  Gets head aches everyday, unchanged, diagnosed as migraines  No visual issues Saw ophthalmologist, reports issues   No galactorrhea, is post-menopausal No new symptoms  Saw Dr Rosslyn Neurosurgeon, pending repeat MRI   Initial history:  11/30/23 MRI brain WO contrast: The patient has developed a mass filling the sella and bulging into the suprasellar region consistent with a pituitary adenoma. This measures 2.0 x 1.5 x 1.6 cm. Definite cavernous sinus extension is not seen. The mass approaches the optic chiasm but does not definitely compress the optic chiasm.  She reports the following;  Headaches Yes, migraines  visual blurring/ diplopia/ fields defect No Galactorrhea No  change in facial appearance  No body habitus No change in her hand, ring, hat, shoe size  No hyperhidrosis No arthralgias No, baseline left knee and L ankle pain since 6-8 weeks   fatigue Yes weight change Yes, on ozempic, lot about 10 lbs  change in appetite Yes, since on ozempic  heat/cold intolerance Yes, subtle  change in bowel movements Yes, constipation  change in muscle strength No changes in skin or hair No Palpitations No insomnia No tremor No  moon faces No fat pads Yes increased girth  Yes plethora hyperpigmentation No purple striae No acne No vellus/terminal hirsutism No proximal muscle weakness No nausea/vomiting No lightheadedness Yes abdominal pain  No  hot flashes No night sweats No  Family history is negative for pituitary tumor or other abnormalities concerning for MEN syndrome.  Physical Exam  BP 120/72   Pulse 74   Ht 5' 5 (1.651 m)   Wt 178 lb 12.8 oz (81.1 kg)   SpO2 98%   BMI 29.75 kg/m    Constitutional: well developed, well  nourished Head: normocephalic, atraumatic Eyes: sclera anicteric, no redness Neck: supple, no thyromegaly/thyroid  tenderness/thyroid  nodules palpated Lungs: normal respiratory effort Neurology: alert and oriented Skin: dry, no appreciable rashes Musculoskeletal: no appreciable defects Psychiatric: normal mood and affect   Current Medications Patient's Medications  New Prescriptions   No medications on file  Previous Medications   ACETAMINOPHEN  (TYLENOL ) 500 MG TABLET    Take 500 mg by mouth 2 (two) times daily as needed for moderate pain (pain). Reported on 10/12/2015   ALBUTEROL  (PROVENTIL  HFA;VENTOLIN  HFA) 108 (90 BASE) MCG/ACT INHALER    Inhale 1 puff into the lungs every 6 (six) hours as needed for wheezing or shortness of breath.   BUPROPION ER (WELLBUTRIN SR) 100 MG 12 HR TABLET    Take 100 mg by mouth 2 (two) times daily.   DIAZEPAM (VALIUM) 5 MG TABLET    Take 5 mg by mouth every 6 (six) hours as needed for anxiety. As needed   DULOXETINE (CYMBALTA) 20 MG CAPSULE    Take 20 mg by mouth daily.   EZETIMIBE (ZETIA) 10 MG TABLET    Take 10 mg by mouth daily.   FLUTICASONE-UMECLIDIN-VILANT (TRELEGY ELLIPTA) 200-62.5-25 MCG/ACT AEPB    Inhale 1 puff into the lungs daily as needed.   METRONIDAZOLE (METROGEL) 0.75 % GEL    metronidazole 0.75 % topical gel   PANTOPRAZOLE  (PROTONIX ) 40 MG TABLET    Take 1 tablet every day by oral route.   PRAZOSIN (MINIPRESS) 2 MG CAPSULE    Take 2 mg by mouth at bedtime.   ROSUVASTATIN (CRESTOR) 40 MG TABLET    Take 40 mg by mouth daily.   SEMAGLUTIDE,0.25 OR 0.5MG /DOS, (OZEMPIC, 0.25 OR 0.5 MG/DOSE,) 2 MG/1.5ML SOPN    Inject 0.5 mLs into the skin every 7 (seven) days.   SULFAMETHOXAZOLE-TRIMETHOPRIM (BACTRIM DS) 800-160 MG TABLET    Take 1 tablet by mouth 2 (two) times daily.   SUMATRIPTAN (IMITREX) 100 MG TABLET    Take by mouth. As needed for Migraine   TRAZODONE (DESYREL) 50 MG TABLET       TRELEGY ELLIPTA 200-62.5-25 MCG/ACT AEPB    Inhale 1 puff  into the lungs daily.  Modified Medications   No medications on file  Discontinued Medications   AMOXICILLIN  (AMOXIL ) 500 MG CAPSULE    Take 500 mg by mouth 3 (three) times daily.   AMOXICILLIN -CLAVULANATE (AUGMENTIN ) 875-125 MG TABLET    Take 1 tablet by mouth 2 (two) times daily.   FLUORESCEIN (GLOSTRIPS) OPHTHALMIC STRIP    Apply 1 strip to eye once.   NEOMYCIN-POLYMYXIN-HYDROCORTISONE (CORTISPORIN) 3.5-10000-1 OTIC SUSPENSION    Place 3 drops into both ears as directed.   TETRACAINE (PONTOCAINE) 0.5 % OPHTHALMIC SOLUTION    Apply 1-2 drops to eye once.   TRETINOIN (RETIN-A) 0.025 % CREAM    Apply topically.    Allergies Allergies  Allergen Reactions   Codeine Other (See Comments)    Nervous system dysfunction, cannot control or use limbs   Oxycodone  Nausea And Vomiting and Nausea Only   Oxycontin  [Oxycodone  Hcl] Other (See Comments)   Percocet [Oxycodone -Acetaminophen ] Nausea And  Vomiting   Percodan [Oxycodone -Aspirin ] Nausea And Vomiting    Past Medical History Past Medical History:  Diagnosis Date   Allergy    Anxiety    Anxiety and depression    Breast cancer (HCC) 04/07/15   right breast   Breast cancer of upper-outer quadrant of right female breast (HCC) 04/11/2015   Broken nose    Complication of anesthesia    had a very hard time waking up   Dengue fever    Depression    Fall    broken nose   GERD (gastroesophageal reflux disease)    Head injury 1995   concussion in [place], [place] evac to [place] , ear damage with vertigo   Head injury, acute, with loss of consciousness (HCC) [place]   Head [place], closed, with concussion 1986   [place]   Headache    Hypertension    Malaria    PONV (postoperative nausea and vomiting)    Typhoid     Past Surgical History Past Surgical History:  Procedure Laterality Date   BREAST LUMPECTOMY Right 04/27/2015   BREAST LUMPECTOMY WITH NEEDLE LOCALIZATION AND AXILLARY SENTINEL LYMPH NODE BX Right  04/27/2015   Procedure: RIGHT BREAST LUMPECTOMY WITH  TWO NEEDLE LOCALIZATION AND TWO RIGHT AXILLARY SENTINEL LYMPH NODE BX;  Surgeon: Elon Pacini, MD;  Location: [facility] OR;  Service: General;  Laterality: Right;   COLONOSCOPY W/ BIOPSIES AND POLYPECTOMY     NERVE SURGERY     '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' during surgery for endometriosis   surgical procedure for endometriosis     TONSILLECTOMY      Family History family history includes Alcohol abuse in her paternal grandfather; Breast cancer in her mother; Diabetes in her maternal grandmother; Emphysema in her father; Heart disease in her paternal grandmother; Hypertension in her mother; Thyroid  disease in her mother.  Social History Social History   Socioeconomic History   Marital status: Divorced    Spouse name: Not on file   Number of children: 0   Years of education: BA   Highest education level: Not on file  Occupational History    Employer: [employer]    Comment: [employer]  Tobacco Use   Smoking status: Former    Current packs/day: 0.00    Average packs/day: 1.5 packs/day for 30.0 years (45.0 ttl pk-yrs)    Types: Cigarettes    Start date: 01/12/1964    Quit date: 01/11/1994    Years since quitting: 30.2   Smokeless tobacco: Never  Substance and Sexual Activity   Alcohol use: Yes    Alcohol/week: 4.0 standard drinks of alcohol    Types: 4 Glasses of wine per week    Comment: moderate    Drug use: No   Sexual activity: Never    Birth control/protection: None  Other Topics Concern   Not on file  Social History Narrative   Patient lives at home with her friend.   Caffeine Use: 1/2 cup daily   Social Drivers of Health   Financial Resource Strain: Not on file  Food Insecurity: No Food Insecurity (09/03/2023)   Hunger Vital Sign    Worried About Running Out of Food in the Last Year: Never true    Ran Out of Food in the Last Year: Never true  Transportation Needs: Not on file  Physical Activity: Not on file  Stress: Not on  file  Social Connections: Unknown (12/26/2021)   [facility] from [facility]   Social  Network    Social Network: Not on file  Intimate Partner Violence: Unknown (11/17/2021)   Received from [facility]   HITS    Physically Hurt: Not on file    Insult or Talk Down To: Not on file    Threaten Physical Harm: Not on file    Scream or Curse: Not on file    No results found for: CHOL No results found for: HDL No results found for: LDLCALC No results found for: TRIG No results found for: [facility] Lab Results  Component Value Date   CREATININE 0.72 03/27/2024   No results found for: GFR    Component Value Date/Time   NA 140 03/27/2024 0000   NA 141 04/01/2017 1523   K 3.9 03/27/2024 0000   K 3.8 04/01/2017 1523   CL 108 03/27/2024 0000   CO2 26 03/27/2024 0000   CO2 25 04/01/2017 1523   GLUCOSE 115 (H) 03/27/2024 0000   GLUCOSE 120 04/01/2017 1523   BUN 12 03/27/2024 0000   BUN 13.6 04/01/2017 1523   CREATININE 0.72 03/27/2024 0000   CREATININE 0.8 04/01/2017 1523   CALCIUM  8.8 03/27/2024 0000   CALCIUM  9.5 04/01/2017 1523   PROT 7.3 12/17/2023 1014   PROT 7.3 04/01/2017 1523   ALBUMIN 4.2 12/17/2023 1014   ALBUMIN 3.5 04/01/2017 1523   AST 25 12/17/2023 1014   AST 16 04/01/2017 1523   ALT 23 12/17/2023 1014   ALT 21 04/01/2017 1523   ALKPHOS 84 12/17/2023 1014   ALKPHOS 93 04/01/2017 1523   BILITOT 0.6 12/17/2023 1014   BILITOT <0.22 04/01/2017 1523   GFRNONAA 59 (L) 12/17/2023 1014   GFRAA >60 04/19/2020 1114      Latest Ref Rng & Units 03/27/2024   12:00 AM 12/17/2023   10:14 AM 11/30/2023   11:01 AM  BMP  Glucose 65 - 99 mg/dL 884  848  816   BUN 7 - 25 mg/dL 12  12  14    Creatinine 0.60 - 1.00 mg/dL 9.27  9.00  9.39   BUN/Creat Ratio 6 - 22 (calc) SEE NOTE:     Sodium 135 - 146 mmol/L 140  140  136   Potassium 3.5 - 5.3 mmol/L 3.9  3.9  4.0   Chloride 98 - 110 mmol/L 108  107  103   CO2 20 - 32 mmol/L 26  27    Calcium  8.6 - 10.4 mg/dL 8.8  9.3          Component Value Date/Time   WBC 8.1 12/17/2023 1014   WBC 9.0 11/30/2023 1058   RBC 4.22 12/17/2023 1014   HGB 12.6 12/17/2023 1014   HGB 12.2 04/01/2017 1523   HCT 37.1 12/17/2023 1014   HCT 38.0 04/01/2017 1523   PLT 295 12/17/2023 1014   PLT 326 04/01/2017 1523   MCV 87.9 12/17/2023 1014   MCV 90.0 04/01/2017 1523   MCH 29.9 12/17/2023 1014   MCHC 34.0 12/17/2023 1014   RDW 13.9 12/17/2023 1014   RDW 14.7 (H) 04/01/2017 1523   LYMPHSABS 1.7 12/17/2023 1014   LYMPHSABS 2.1 04/01/2017 1523   MONOABS 0.6 12/17/2023 1014   MONOABS 0.7 04/01/2017 1523   EOSABS 0.2 12/17/2023 1014   EOSABS 0.4 04/01/2017 1523   BASOSABS 0.1 12/17/2023 1014   BASOSABS 0.1 04/01/2017 1523   Lab Results  Component Value Date   TSH 3.94 03/27/2024   FREET4 0.5 (L) 03/27/2024         Parts of  this note may have been dictated using voice recognition software. There may be variances in spelling and vocabulary which are unintentional. Not all errors are proofread. Please notify the dino if any discrepancies are noted or if the meaning of any statement is not clear.

## 2024-04-14 ENCOUNTER — Ambulatory Visit: Admitting: Neurosurgery

## 2024-04-21 ENCOUNTER — Ambulatory Visit (INDEPENDENT_AMBULATORY_CARE_PROVIDER_SITE_OTHER): Admitting: Neurology

## 2024-04-21 DIAGNOSIS — Z8669 Personal history of other diseases of the nervous system and sense organs: Secondary | ICD-10-CM

## 2024-04-21 DIAGNOSIS — F4310 Post-traumatic stress disorder, unspecified: Secondary | ICD-10-CM

## 2024-04-21 DIAGNOSIS — G4733 Obstructive sleep apnea (adult) (pediatric): Secondary | ICD-10-CM

## 2024-04-21 DIAGNOSIS — J449 Chronic obstructive pulmonary disease, unspecified: Secondary | ICD-10-CM

## 2024-04-22 NOTE — Progress Notes (Signed)
 [facility]  Jessica Roberts 78 year old female [birth year]     HOME SLEEP TEST REPORT ( by [facility])   STUDY DATE:  04-21-2024   ORDERING CLINICIAN: Dedra Gores, MD  Primary Neurologist: Dr Margaret  REFERRING CLINICIAN:  Maude Sprague, MD    CLINICAL INFORMATION/HISTORY:  02-25-2024; Jessica Roberts is a 78 y.o. female patient who is seen upon referral on 02/25/2024 from Dr Sprague  for a sleep consult. She is awaiting a revisit with Dr Margaret for 2 falls, sudden falls without LOC.  She has COPD, sleep hypoxia and is reportedly not able to use CPAP.  She is not excessively sleepy but fatigued. She reports PLms/ RLS.      Chief concern according to patient :   I was diagnosed with mild sleep apnea  the patient reports that it was obstructive sleep apnea approximately 2019 at the time the AHI was 9.6/h and CPAP was prescribed but I could not tolerate it.  I do also have diabetes and depression and Dr. Sprague would like me to be reevaluated and see if there are other ways to treat my apnea and if they continue to have apnea at all.     The patient was seen at [facility] internal medicine where she saw Dr. Tammy  in March 2019 . she had been referred by the primary care provider PA Lillard.  At the time she had stated that she was excessively daytime sleepy - but her Epworth sleepiness score was only endorsed at 8 out of 24 points. the main problem I see, is that the patient had actually severe desaturations (36 minutes of total sleep time were in hypoxia).  78 y.o. year old female patient of Dr Chancy and Dr Magnus, referred  to be re-evaluated for sleep apnea, hypoxia.  History of remote  but heavy smoking, COPD,  and of PTSD,      Epworth sleepiness score: 7/ 24 points   FSS endorsed at 54/ 63 points.  GDS : 6/ 15  BMI:  28 kg/m, retrognathia.  Neck Circumference: 15.5   Sleep Summary:    Total Recording Time  (hours, min):   7 h 49 minutes     Total Sleep Time (hours, min):     7 h 9 minutes            Percent REM (%):   24.5%                                     Respiratory Indices:   Calculated pAHI (per CMS guideline): 17.1/h                         REM pAHI:    35/h                                             NREM pAHI:  11.4/h                           Positional AHI:    Supine  22.6/h  versus non-supine AHI of  7.3 / h   Snoring:   Mean Volume was 43 dB.  Oxygen Saturation Statistics:   Oxygen Saturation (%) Mean:   92% , between 78% and 97% during sleep.                                               O2 Saturation (minutes) <89%: 5.7 minutes           Pulse Rate Statistics:   Pulse Mean (bpm):   65 bpm , between 54 and 109 bpm .                        IMPRESSION:  This HST confirms the presence of moderate severe , all -obstructive sleep apnea with a strong REM sleep dependency, making this form of apnea best treated by positive airway pressure therapy.  Patient with underlying COPD also benefit from the potential correction of hypoxia by CPAP or BiPAP, that other treatments can not provide. ( Inspire/ dental device, etc) .    RECOMMENDATION:  I like to invite the patient to an in- lab- titration of PAP therapy, given her past experience of intolerance - this allows best trouble shooting opportunity by changing modalities, pressures or interface.  Alternative, should her insurance deny coverage for such sleep study, I would then offer autotitration CPAP by ResMed device at manufacturers settings, 5-20 cm water, 2 cm EPR and heated humidification with interface of patient's comfort and choice.    I want to remind this patient that avoiding supine sleep will be helping her to reduce her apnea index overall.   This patient is still considered overweight , but not obese.     Any patient should be cautioned not to drive, work at  heights, or operate dangerous or heavy equipment when tired or sleepy.   Review of good sleep hygiene measures is accessible to any sleep clinic patient and can be reiterated through online material- I we recommend the Guide to better Sleep   by the NIH.   Weight loss and Core Strength improvement is highly recommended for individuals with low muscle tone and/ or a BMI over 30.  Any CPAP patient should be reminded to be fully compliant with PAP therapy , (defined as using PAP therapy for more than 4 hours each night ) with the goal to improve sleep related symptoms and decrease long term cardiovascular risks. Any PAP therapy patient should be reminded, that it may take up to 3 months to get fully used to using PAP and it may take 1-2 weeks for an established CPAP user to acclimatize to changes in pressure or mask. The earlier full compliance is achieved, the better long term compliance tends to be.   Please note that untreated obstructive sleep apnea may carry additional perioperative morbidity. Patients with significant obstructive sleep apnea should receive perioperative PAP therapy and the surgical team should be informed of the diagnosis and degree of sleep disordered breathing.  Sleep fragmentation in the presence of normal proportional sleep stages is a nonspecific findings and per se does not signify an intrinsic sleep disorder or a cause for the patient's sleep-related symptoms.  Causes include (but are not limited to) the unfamiliarity of sleeping while recorded by HST device or sleeping in a sleep lab for a full Polysomnography sleep study, but also circadian rhythm disturbances, medication side effects or an underlying mood disorder or medical problem.   The  referring physician will be notified of the test results.       INTERPRETING PHYSICIAN:   Dedra Gores, MD  [facility] Board certified by The [employer] of Sleep Medicine and  Diplomate of the [employer] of Sleep Medicine. Board certified In Neurology through the ABPN, Fellow of the [employer] of Neu[employer].

## 2024-04-24 ENCOUNTER — Ambulatory Visit
Admission: RE | Admit: 2024-04-24 | Discharge: 2024-04-24 | Disposition: A | Discharge status: Home / Self Care | Source: Ambulatory Visit | Attending: "Endocrinology | Admitting: "Endocrinology

## 2024-04-24 ENCOUNTER — Other Ambulatory Visit (HOSPITAL_COMMUNITY)
Admission: RE | Admit: 2024-04-24 | Discharge: 2024-04-24 | Disposition: A | Discharge status: Home / Self Care | Source: Ambulatory Visit | Attending: Radiology | Admitting: Radiology

## 2024-04-24 DIAGNOSIS — E041 Nontoxic single thyroid nodule: Secondary | ICD-10-CM

## 2024-04-25 ENCOUNTER — Ambulatory Visit
Admission: RE | Admit: 2024-04-25 | Discharge: 2024-04-25 | Disposition: A | Discharge status: Home / Self Care | Source: Ambulatory Visit | Attending: Neurosurgery | Admitting: Neurosurgery

## 2024-04-25 DIAGNOSIS — D352 Benign neoplasm of pituitary gland: Secondary | ICD-10-CM

## 2024-04-28 ENCOUNTER — Ambulatory Visit (INDEPENDENT_AMBULATORY_CARE_PROVIDER_SITE_OTHER): Admitting: Neurosurgery

## 2024-04-28 ENCOUNTER — Encounter: Payer: Self-pay | Admitting: Neurosurgery

## 2024-04-28 ENCOUNTER — Ambulatory Visit
Admission: RE | Admit: 2024-04-28 | Discharge: 2024-04-28 | Disposition: A | Discharge status: Home / Self Care | Source: Ambulatory Visit | Attending: Neurosurgery | Admitting: Neurosurgery

## 2024-04-28 VITALS — BP 104/66 | HR 78 | Ht 66.0 in | Wt 173.0 lb

## 2024-04-28 DIAGNOSIS — D352 Benign neoplasm of pituitary gland: Secondary | ICD-10-CM

## 2024-04-28 MED ORDER — GADOPICLENOL 0.5 MMOL/ML IV SOLN
7.0000 mL | Freq: Once | INTRAVENOUS | Status: AC | PRN
  Administered 2024-04-28: 7 mL via INTRAVENOUS

## 2024-04-28 NOTE — Progress Notes (Signed)
 78 year old lady with a history of breast cancer and finding of a pituitary adenoma.  When I saw her last her lab work have been on that day so we did not have the results.  She returns with a 38-month follow-up MRI.  I reviewed the lab work and her free T4 is just slightly low and her prolactin slightly elevated.  Oftentimes, with pituitary adenomas the prolactin can be elevated but this does not mean that the patient has a prolactinoma but it is merely a so-called stalk effect.  I told her that I have looked at the lab work and I do not think that her adenoma is hormonally productive.  Also, I looked at the MRI side-by-side and measured it and it does not show any significant change.  Therefore, I do not believe this is a metastasis.  I assured her and she was very happy to hear this.  I will get an MRI of the brain in 1 year and I will not administer contrast because it is readily visible on the noncontrast images and only give it if there is a specific need.  She pledged to me that if she starts having more headaches or visual problems she is going to come and see me.  I reviewed the eye exam report and she does not have any visual loss and she is going to go and see the eye team in a year.  She will bring the report with her.

## 2024-04-30 LAB — CYTOLOGY - NON PAP

## 2024-05-03 ENCOUNTER — Ambulatory Visit: Payer: Self-pay | Admitting: Neurology

## 2024-05-03 NOTE — Procedures (Signed)
 [facility]  Jessica Roberts 78 year old female September 13, 1945     HOME SLEEP TEST REPORT ( by Watch PAT)   STUDY DATE:  04-21-2024   ORDERING CLINICIAN: Dedra Gores, MD  Primary Neurologist: Dr Margaret  REFERRING CLINICIAN:  Maude Sprague, MD    CLINICAL INFORMATION/HISTORY:  02-25-2024; Jessica Roberts is a 78 y.o. female patient who is seen upon referral on 02/25/2024 from Dr Sprague  for a sleep consult. She is awaiting a revisit with Dr Margaret for 2 falls, sudden falls without LOC.  She has COPD, sleep hypoxia and is reportedly not able to use CPAP.  She is not excessively sleepy but fatigued. She reports PLms/ RLS.      Chief concern according to patient :   I was diagnosed with mild sleep apnea  the patient reports that it was obstructive sleep apnea approximately 2019 at the time the AHI was 9.6/h and CPAP was prescribed but I could not tolerate it.  I do also have diabetes and depression and Dr. Sprague would like me to be reevaluated and see if there are other ways to treat my apnea and if they continue to have apnea at all.     The patient was seen at [facility] internal medicine where she saw Dr. Tammy  in March 2019 . she had been referred by the primary care provider PA Lillard.  At the time she had stated that she was excessively daytime sleepy - but her Epworth sleepiness score was only endorsed at 8 out of 24 points. the main problem I see, is that the patient had actually severe desaturations (36 minutes of total sleep time were in hypoxia).  78 y.o. year old female patient of Dr Chancy and Dr Magnus, referred  to be re-evaluated for sleep apnea, hypoxia.  History of remote  but heavy smoking, COPD,  and of PTSD,      Epworth sleepiness score: 7/ 24 points   FSS endorsed at 54/ 63 points.  GDS : 6/ 15  BMI:  28 kg/m, retrognathia.  Neck Circumference: 15.5   Sleep Summary:    Total Recording Time (hours,  min):   7 h 49 minutes     Total Sleep Time (hours, min):     7 h 9 minutes            Percent REM (%):   24.5%                                     Respiratory Indices:   Calculated pAHI (per CMS guideline): 17.1/h                         REM pAHI:    35/h                                             NREM pAHI:  11.4/h                           Positional AHI:    Supine  22.6/h  versus non-supine AHI of  7.3 / h   Snoring:   Mean Volume was 43 dB.  Oxygen Saturation Statistics:   Oxygen Saturation (%) Mean:   92% , between 78% and 97% during sleep.                                               O2 Saturation (minutes) <89%: 5.7 minutes           Pulse Rate Statistics:   Pulse Mean (bpm):   65 bpm , between 54 and 109 bpm .                        IMPRESSION:  This HST confirms the presence of moderate severe , all -obstructive sleep apnea with a strong REM sleep dependency, making this form of apnea best treated by positive airway pressure therapy.  Patient with underlying COPD also benefit from the potential correction of hypoxia by CPAP or BiPAP, that other treatments can not provide. ( Inspire/ dental device, etc) .    RECOMMENDATION:  I like to invite the patient to an in- lab- titration of PAP therapy, given her past experience of intolerance - this allows best trouble shooting opportunity by changing modalities, pressures or interface.  Alternative, should her insurance deny coverage for such sleep study, I would then offer autotitration CPAP by ResMed device at manufacturers settings, 5-20 cm water, 2 cm EPR and heated humidification with interface of patient's comfort and choice.    I want to remind this patient that avoiding supine sleep will be helping her to reduce her apnea index overall.   This patient is still considered overweight , but not obese.     Any patient should be cautioned not to drive, work at heights, or  operate dangerous or heavy equipment when tired or sleepy.   Review of good sleep hygiene measures is accessible to any sleep clinic patient and can be reiterated through online material- I we recommend the Guide to better Sleep   by the NIH.   Weight loss and Core Strength improvement is highly recommended for individuals with low muscle tone and/ or a BMI over 30.  Any CPAP patient should be reminded to be fully compliant with PAP therapy , (defined as using PAP therapy for more than 4 hours each night ) with the goal to improve sleep related symptoms and decrease long term cardiovascular risks. Any PAP therapy patient should be reminded, that it may take up to 3 months to get fully used to using PAP and it may take 1-2 weeks for an established CPAP user to acclimatize to changes in pressure or mask. The earlier full compliance is achieved, the better long term compliance tends to be.   Please note that untreated obstructive sleep apnea may carry additional perioperative morbidity. Patients with significant obstructive sleep apnea should receive perioperative PAP therapy and the surgical team should be informed of the diagnosis and degree of sleep disordered breathing.  Sleep fragmentation in the presence of normal proportional sleep stages is a nonspecific findings and per se does not signify an intrinsic sleep disorder or a cause for the patient's sleep-related symptoms.  Causes include (but are not limited to) the unfamiliarity of sleeping while recorded by HST device or sleeping in a sleep lab for a full Polysomnography sleep study, but also circadian rhythm disturbances, medication side effects or an underlying mood disorder or medical problem.   The  referring physician will be notified of the test results.       INTERPRETING PHYSICIAN:   Dedra Gores, MD  [facility] Sleep Board certified by [employer] of Sleep Medicine and Diplomate of the  [employer] of [employer]. Board certified In Neurology through the ABPN, Fellow of the [employer] of Neurology.                 [facility]  Jessica Roberts 78 year old female 03-Jun-1946     HOME SLEEP TEST REPORT ( by Watch PAT)   STUDY DATE:  04-21-2024   ORDERING CLINICIAN: Dedra Gores, MD  Primary Neurologist: Dr Margaret  REFERRING CLINICIAN:  Maude Sprague, MD    CLINICAL INFORMATION/HISTORY:  02-25-2024; Jessica Roberts is a 78 y.o. female patient who is seen upon referral on 02/25/2024 from Dr Sprague  for a sleep consult. She is awaiting a revisit with Dr Margaret for 2 falls, sudden falls without LOC.  She has COPD, sleep hypoxia and is reportedly not able to use CPAP.  She is not excessively sleepy but fatigued. She reports PLms/ RLS.      Chief concern according to patient :   I was diagnosed with mild sleep apnea  the patient reports that it was obstructive sleep apnea approximately 2019 at the time the AHI was 9.6/h and CPAP was prescribed but I could not tolerate it.  I do also have diabetes and depression and Dr. Sprague would like me to be reevaluated and see if there are other ways to treat my apnea and if they continue to have apnea at all.     The patient was seen at [facility] internal medicine where she saw Dr. Tammy  in March 2019 . she had been referred by the primary care provider PA Lillard.  At the time she had stated that she was excessively daytime sleepy - but her Epworth sleepiness score was only endorsed at 8 out of 24 points. the main problem I see, is that the patient had actually severe desaturations (36 minutes of total sleep time were in hypoxia).  78 y.o. year old female patient of Dr Chancy and Dr Magnus, referred  to be re-evaluated for sleep apnea, hypoxia.  History of remote  but heavy smoking, COPD,  and of PTSD,      Epworth sleepiness score: 7/ 24 points   FSS endorsed  at 54/ 63 points.  GDS : 6/ 15  BMI:  28 kg/m, retrognathia.  Neck Circumference: 15.5   Sleep Summary:    Total Recording Time (hours, min):   7 h 49 minutes     Total Sleep Time (hours, min):     7 h 9 minutes            Percent REM (%):   24.5%                                     Respiratory Indices:   Calculated pAHI (per CMS guideline): 17.1/h                         REM pAHI:    35/h  NREM pAHI:  11.4/h                           Positional AHI:    Supine  22.6/h  versus non-supine AHI of  7.3 / h   Snoring:   Mean Volume was 43 dB.                                               Oxygen Saturation Statistics:   Oxygen Saturation (%) Mean:   92% , between 78% and 97% during sleep.                                               O2 Saturation (minutes) <89%: 5.7 minutes           Pulse Rate Statistics:   Pulse Mean (bpm):   65 bpm , between 54 and 109 bpm .                        IMPRESSION:  This HST confirms the presence of moderate severe , all -obstructive sleep apnea with a strong REM sleep dependency, making this form of apnea best treated by positive airway pressure therapy.  Patient with underlying COPD also benefit from the potential correction of hypoxia by CPAP or BiPAP, that other treatments can not provide. ( Inspire/ dental device, etc) .    RECOMMENDATION:  I like to invite the patient to an in- lab- titration of PAP therapy, given her past experience of intolerance - this allows best trouble shooting opportunity by changing modalities, pressures or interface.  Alternative, should her insurance deny coverage for such sleep study, I would then offer autotitration CPAP by ResMed device at manufacturers settings, 5-20 cm water, 2 cm EPR and heated humidification with interface of patient's comfort and choice.    I want to remind this patient that avoiding supine sleep will be helping her to reduce her apnea index  overall.   This patient is still considered overweight , but not obese.     Any patient should be cautioned not to drive, work at heights, or operate dangerous or heavy equipment when tired or sleepy.   Review of good sleep hygiene measures is accessible to any sleep clinic patient and can be reiterated through online material- I we recommend the Guide to better Sleep   by the NIH.   Weight loss and Core Strength improvement is highly recommended for individuals with low muscle tone and/ or a BMI over 30.  Any CPAP patient should be reminded to be fully compliant with PAP therapy , (defined as using PAP therapy for more than 4 hours each night ) with the goal to improve sleep related symptoms and decrease long term cardiovascular risks. Any PAP therapy patient should be reminded, that it may take up to 3 months to get fully used to using PAP and it may take 1-2 weeks for an established CPAP user to acclimatize to changes in pressure or mask. The earlier full compliance is achieved, the better long term compliance tends to be.   Please note that untreated obstructive sleep apnea may carry additional perioperative morbidity. Patients with significant obstructive sleep  apnea should receive perioperative PAP therapy and the surgical team should be informed of the diagnosis and degree of sleep disordered breathing.  Sleep fragmentation in the presence of normal proportional sleep stages is a nonspecific findings and per se does not signify an intrinsic sleep disorder or a cause for the patient's sleep-related symptoms.  Causes include (but are not limited to) the unfamiliarity of sleeping while recorded by HST device or sleeping in a sleep lab for a full Polysomnography sleep study, but also circadian rhythm disturbances, medication side effects or an underlying mood disorder or medical problem.   The referring physician will be notified of the test results.       INTERPRETING PHYSICIAN:    Dedra Gores, MD  [facility] Sleep Board certified by The [employer] of Sleep Medicine and Diplomate of the [employer] of Sleep Medicine. Board certified In Neurology through the ABPN, Fellow of the [employer] of Neu[employer].

## 2024-05-05 ENCOUNTER — Ambulatory Visit: Payer: Self-pay | Admitting: "Endocrinology

## 2024-05-05 ENCOUNTER — Other Ambulatory Visit

## 2024-05-08 NOTE — Telephone Encounter (Signed)
 Patient called for results of her thyroid  biopsy. Chart reviewed. Note from MD read to patient:  result of biopsy of Thyroid  Nodule: Negative for cancer cells. There is still a <6% chance of false negative results so it is important to maintain follow up with the doctor.   Patient to call for further questions.  Leita Constable, RD, LDN, CDCES, DipACLM

## 2024-05-20 ENCOUNTER — Ambulatory Visit: Admitting: Diagnostic Neuroimaging

## 2024-05-21 ENCOUNTER — Ambulatory Visit (INDEPENDENT_AMBULATORY_CARE_PROVIDER_SITE_OTHER): Admitting: Orthopaedic Surgery

## 2024-05-21 ENCOUNTER — Other Ambulatory Visit: Payer: Self-pay

## 2024-05-21 ENCOUNTER — Encounter: Payer: Self-pay | Admitting: Orthopaedic Surgery

## 2024-05-21 DIAGNOSIS — M25571 Pain in right ankle and joints of right foot: Secondary | ICD-10-CM | POA: Diagnosis not present

## 2024-05-21 DIAGNOSIS — M25572 Pain in left ankle and joints of left foot: Secondary | ICD-10-CM | POA: Diagnosis not present

## 2024-05-21 NOTE — Progress Notes (Signed)
 The patient comes in today for evaluation treatment of left ankle pain and she points around the peroneal tendons as it comes down around the posterior fibula source of her pain but no known injury.  She is very active 78 years old.  She did have a friend who is a nurse put a ankle wrap around her ankle and she just took that off today and she said that its helped her quite a bit in terms of feeling better.  The right ankle still hurts from time to time.  Her only repetitive activity has been watching TV at night where she sits crisscross applesauce.  I do believe that is putting pressure on her ankle.  On exam today her Achilles is intact on both sides.  Her ankle motion is excellent on both sides.  Her left side does have pain along the peroneal tendon as it courses behind the fibula but the tendons do not sublux.  X-rays of both ankle showed normal-appearing ankles with no acute findings.  I do believe she has developed some peroneal tendon tendinitis.  She will try Voltaren gel as well as Advil and consider getting an ankle sleeve for her ankles.  I did place an Ace wrap around her left ankle.  Follow-up can be as needed.  If things worsen she knows to reach out and let us  know.  All questions and concerns were addressed and answered.

## 2024-05-26 ENCOUNTER — Telehealth: Payer: Self-pay | Admitting: Neurology

## 2024-05-26 NOTE — Telephone Encounter (Signed)
 CPAP No auth req via website.  Patient is scheduled at [facility] for 06/30/24 at 9 pm.  Mailed packet and sent mychart

## 2024-06-15 ENCOUNTER — Encounter: Payer: Self-pay | Admitting: Radiology

## 2024-06-22 ENCOUNTER — Other Ambulatory Visit: Payer: Self-pay

## 2024-06-22 DIAGNOSIS — D352 Benign neoplasm of pituitary gland: Secondary | ICD-10-CM

## 2024-06-26 ENCOUNTER — Other Ambulatory Visit

## 2024-06-29 ENCOUNTER — Other Ambulatory Visit: Payer: Self-pay

## 2024-06-29 DIAGNOSIS — C50411 Malignant neoplasm of upper-outer quadrant of right female breast: Secondary | ICD-10-CM

## 2024-06-29 NOTE — Progress Notes (Deleted)
 Patient Care Team: Windy Coy, MD as PCP - General (Family Medicine) Loni Soyla LABOR, MD as PCP - Cardiology (Cardiology) Lanny Callander, MD as Consulting Physician (Hematology) Dianna Specking, MD as Consulting Physician (Gastroenterology) Elner Arley LABOR, MD as Consulting Physician (Ophthalmology) Rosslyn Dino HERO, MD as Consulting Physician (Neurosurgery)  Clinic Day:  06/29/2024  Referring physician: Windy Coy, MD  ASSESSMENT & PLAN:   Assessment & Plan: No problem-specific Assessment & Plan notes found for this encounter.    The patient understands the plans discussed today and is in agreement with them.  She knows to contact our office if she develops concerns prior to her next appointment.  I provided *** minutes of face-to-face time during this encounter and > 50% was spent counseling as documented under my assessment and plan.    Powell FORBES Lessen, NP  [facility] - A DEPT OF [facility] [address] [place] [address] Dept: [phone] Dept Fax: [phone]   No orders of the defined types were placed in this encounter.     CHIEF COMPLAINT:  CC: Right breast cancer, ER +  Current Treatment: Breast cancer surveillance; IV iron  as needed  INTERVAL HISTORY:  Jessica Roberts is here today for repeat clinical assessment.  She last saw me on 07/01/2023.  She denies fevers or chills. She denies pain. Her appetite is good. Her weight {Weight change:10426}.  I have reviewed the past medical history, past surgical history, social history and family history with the patient and they are unchanged from previous note.  ALLERGIES:  is allergic to codeine, oxycodone , oxycontin  [oxycodone  hcl], percocet [oxycodone -acetaminophen ], and percodan [oxycodone -aspirin ].  MEDICATIONS:  Current Outpatient Medications  Medication Sig Dispense Refill   acetaminophen  (TYLENOL ) 500 MG tablet Take 500 mg by mouth  2 (two) times daily as needed for moderate pain (pain). Reported on 10/12/2015     albuterol  (PROVENTIL  HFA;VENTOLIN  HFA) 108 (90 Base) MCG/ACT inhaler Inhale 1 puff into the lungs every 6 (six) hours as needed for wheezing or shortness of breath.     buPROPion ER (WELLBUTRIN SR) 100 MG 12 hr tablet Take 100 mg by mouth 2 (two) times daily.     diazepam (VALIUM) 5 MG tablet Take 5 mg by mouth every 6 (six) hours as needed for anxiety. As needed     DULoxetine (CYMBALTA) 20 MG capsule Take 20 mg by mouth daily.     ezetimibe (ZETIA) 10 MG tablet Take 10 mg by mouth daily.     Fluticasone-Umeclidin-Vilant (TRELEGY ELLIPTA) 200-62.5-25 MCG/ACT AEPB Inhale 1 puff into the lungs daily as needed.     metroNIDAZOLE (METROGEL) 0.75 % gel metronidazole 0.75 % topical gel     pantoprazole  (PROTONIX ) 40 MG tablet Take 1 tablet every day by oral route.     prazosin (MINIPRESS) 2 MG capsule Take 2 mg by mouth at bedtime.     rosuvastatin (CRESTOR) 40 MG tablet Take 40 mg by mouth daily.     Semaglutide,0.25 or 0.5MG /DOS, (OZEMPIC, 0.25 OR 0.5 MG/DOSE,) 2 MG/1.5ML SOPN Inject 0.5 mLs into the skin every 7 (seven) days.     sulfamethoxazole-trimethoprim (BACTRIM DS) 800-160 MG tablet Take 1 tablet by mouth 2 (two) times daily. (Patient not taking: Reported on 04/28/2024)     SUMAtriptan (IMITREX) 100 MG tablet Take by mouth. As needed for Migraine     traZODone (DESYREL) 50 MG tablet      TRELEGY ELLIPTA 200-62.5-25 MCG/ACT AEPB  Inhale 1 puff into the lungs daily.     No current facility-administered medications for this visit.    HISTORY OF PRESENT ILLNESS:   Oncology History Overview Note  Cancer Staging Breast cancer of upper-outer quadrant of right female breast [facility]) Staging form: Breast, AJCC 7th Edition - Clinical stage from 04/07/2015: Stage IA (T1a, N0, M0) - Signed by Lanny Callander, MD on 05/20/2015 - Pathologic stage from 04/27/2015: Stage IA (T1b, N0, cM0) - Signed by Lanny Callander, MD on  05/20/2015       Breast cancer of upper-outer quadrant of right female breast (HCC)  04/01/2015 Mammogram   Diagnostic mammogram and US  showed a 0.7x 0.4 x 0.4 cm mass in the right breast 11:00 position, and a second 5mm lesion at upper-midline, and a benign cluster of cysts measuring 1cm.    04/07/2015 Initial Biopsy   Right breast UOQ mass core needle biopsy showed invasive ductal carcinoma and DCIS, grade 1. ER+ (100%), PR+ (80%), HER2/neu negative (ratio 1.25), Ki67 5%.   04/07/2015 Clinical Stage   Stage IA: T1a N0   04/27/2015 Definitive Surgery   Right breast double lumpectomy/SLNB: IDC, 0.9 cm, DCIS, invasive dz 0.1 cm from posterior margin, rem. margins >0.5 cm; second lump: fibrocystic change. 1 LN negative for malignancy   04/27/2015 Pathologic Stage   Stage IA: T1b N0   06/21/2015 - 07/13/2015 Radiation Therapy   Adjuvant RT: Right breast 42.72 Gy over 21 fractions.     Anti-estrogen oral therapy   Pt declined adjuvant anti-estrogen therapy    09/29/2015 Survivorship   Survivorship visit completed and copy of care plan given to patient.   07/17/2016 Imaging   MM DIAG BREAST TOMO BILATERALL 07/17/16 MPRESSION: Probable mildly complicated fluid collection/seroma within the right breast 1130 o'clock along the posterior margin of the lumpectomy site.   09/04/2016 Imaging   US  BREAST LTD UNI RIGHT INC AXILLA: 09/04/16 IMPRESSION: Stable probably benign postsurgical fluid collection/seroma within the right breast at the 11:30 o'clock axis, 6 cm from the nipple, measuring 2.3 x 0.9 x 1.2 cm. Recommend additional follow-up right breast diagnostic mammogram and ultrasound in 6 months to ensure continued stability.   03/21/2017 Mammogram   R breast Mammogram and US   Mammographic images were processed with CAD.   Targeted ultrasound is performed, showing slightly smaller mixed echogenicity collection within the right breast at 11:30 o'clock 6 cm from nipple measuring 0.9 x 0.7 x  1.4 cm. This is probably a postsurgical seroma.   07/31/2017 Mammogram   IMPRESSION: 1. Indeterminate 9 mm group of fine pleomorphic calcifications involving the upper outer quadrant of the right breast at posterior depth. 2. New partially obscured mass involving the inner left breast at anterior posterior depth.   08/07/2017 Breast US    Targeted right breast ultrasound is performed, showing the previously identified mildly complex fluid collection at the lumpectomy site at the 11:30 o'clock approximately 6 cm from the nipple has decreased in size in the interval, currently measuring 6 x 8 x 8 mm (previously 7 x 9 by 14 mm). No new suspicious solid mass or abnormal acoustic shadowing is identified.   Targeted left breast ultrasound is performed, showing that the new mammographic mass corresponds to an oval circumscribed parallel hypoechoic mass at the 10 o'clock position approximately 4 cm from the nipple measuring approximately 5 x 4 x 5 mm, demonstrating slight posterior acoustic enhancement and no internal power Doppler flow.   IMPRESSION: 1. Interval decrease in size of the postoperative seroma  at the lumpectomy site in the upper outer quadrant of the right breast. 2. Likely benign 5 mm complex cyst in the upper inner quadrant of the left breast accounting for the new mammographic finding.   08/07/2017 Procedure   aspiration of the complex cyst in the upper inner quadrant of the left breast at the 10 o'clock position approximately 4 cm from the nipple. Less than 1 cc of cyst fluid was aspirated. The cyst completely collapsed with aspiration and there is no associated solid component.   08/07/2017 Pathology Results   Diagnosis Breast, left, needle core biopsy, UIQ at posterior depth - BENIGN BREAST TISSUE WITH CALCIFICATIONS. - NO MALIGNANCY IDENTIFIED. - SEE COMMENT.    03/10/2018 Mammogram    03/10/2018 Mammogram IMPRESSION: No mammographic evidence of malignancy involving the LEFT  breast.       REVIEW OF SYSTEMS:   Constitutional: Denies fevers, chills or abnormal weight loss Eyes: Denies blurriness of vision Ears, nose, mouth, throat, and face: Denies mucositis or sore throat Respiratory: Denies cough, dyspnea or wheezes Cardiovascular: Denies palpitation, chest discomfort or lower extremity swelling Gastrointestinal:  Denies nausea, heartburn or change in bowel habits Skin: Denies abnormal skin rashes Lymphatics: Denies new lymphadenopathy or easy bruising Neurological:Denies numbness, tingling or new weaknesses Behavioral/Psych: Mood is stable, no new changes  All other systems were reviewed with the patient and are negative.   VITALS:  There were no vitals taken for this visit.  Wt Readings from Last 3 Encounters:  04/28/24 173 lb (78.5 kg)  04/07/24 178 lb 12.8 oz (81.1 kg)  03/27/24 178 lb (80.7 kg)    There is no height or weight on file to calculate BMI.  Performance status (ECOG): {CHL ONC [id number]  PHYSICAL EXAM:   GENERAL:alert, no distress and comfortable SKIN: skin color, texture, turgor are normal, no rashes or significant lesions EYES: normal, Conjunctiva are pink and non-injected, sclera clear OROPHARYNX:no exudate, no erythema and lips, buccal mucosa, and tongue normal  NECK: supple, thyroid  normal size, non-tender, without nodularity LYMPH:  no palpable lymphadenopathy in the cervical, axillary or inguinal LUNGS: clear to auscultation and percussion with normal breathing effort HEART: regular rate & rhythm and no murmurs and no lower extremity edema ABDOMEN:abdomen soft, non-tender and normal bowel sounds Musculoskeletal:no cyanosis of digits and no clubbing  NEURO: alert & oriented x 3 with fluent speech, no focal motor/sensory deficits  LABORATORY DATA:  I have reviewed the data as listed    Component Value Date/Time   NA 140 03/27/2024 0000   NA 141 04/01/2017 1523   K 3.9 03/27/2024 0000   K 3.8 04/01/2017 1523    CL 108 03/27/2024 0000   CO2 26 03/27/2024 0000   CO2 25 04/01/2017 1523   GLUCOSE 115 (H) 03/27/2024 0000   GLUCOSE 120 04/01/2017 1523   BUN 12 03/27/2024 0000   BUN 13.6 04/01/2017 1523   CREATININE 0.72 03/27/2024 0000   CREATININE 0.8 04/01/2017 1523   CALCIUM  8.8 03/27/2024 0000   CALCIUM  9.5 04/01/2017 1523   PROT 7.3 12/17/2023 1014   PROT 7.3 04/01/2017 1523   ALBUMIN 4.2 12/17/2023 1014   ALBUMIN 3.5 04/01/2017 1523   AST 25 12/17/2023 1014   AST 16 04/01/2017 1523   ALT 23 12/17/2023 1014   ALT 21 04/01/2017 1523   ALKPHOS 84 12/17/2023 1014   ALKPHOS 93 04/01/2017 1523   BILITOT 0.6 12/17/2023 1014   BILITOT <0.22 04/01/2017 1523   GFRNONAA 59 (L) 12/17/2023 1014   GFRAA >  60 04/19/2020 1114    No results found for: SPEP, UPEP  Lab Results  Component Value Date   WBC 8.1 12/17/2023   NEUTROABS 5.7 12/17/2023   HGB 12.6 12/17/2023   HCT 37.1 12/17/2023   MCV 87.9 12/17/2023   PLT 295 12/17/2023      Chemistry      Component Value Date/Time   NA 140 03/27/2024 0000   NA 141 04/01/2017 1523   K 3.9 03/27/2024 0000   K 3.8 04/01/2017 1523   CL 108 03/27/2024 0000   CO2 26 03/27/2024 0000   CO2 25 04/01/2017 1523   BUN 12 03/27/2024 0000   BUN 13.6 04/01/2017 1523   CREATININE 0.72 03/27/2024 0000   CREATININE 0.8 04/01/2017 1523      Component Value Date/Time   CALCIUM  8.8 03/27/2024 0000   CALCIUM  9.5 04/01/2017 1523   ALKPHOS 84 12/17/2023 1014   ALKPHOS 93 04/01/2017 1523   AST 25 12/17/2023 1014   AST 16 04/01/2017 1523   ALT 23 12/17/2023 1014   ALT 21 04/01/2017 1523   BILITOT 0.6 12/17/2023 1014   BILITOT <0.22 04/01/2017 1523       RADIOGRAPHIC STUDIES: I have personally reviewed the radiological images as listed and agreed with the findings in the report. No results found.

## 2024-06-30 ENCOUNTER — Ambulatory Visit: Admitting: "Endocrinology

## 2024-06-30 ENCOUNTER — Inpatient Hospital Stay: Payer: Medicare Other

## 2024-06-30 ENCOUNTER — Ambulatory Visit: Admitting: Neurology

## 2024-06-30 ENCOUNTER — Ambulatory Visit: Payer: Medicare Other | Admitting: Nurse Practitioner

## 2024-06-30 DIAGNOSIS — R400 Somnolence: Secondary | ICD-10-CM

## 2024-06-30 DIAGNOSIS — G4733 Obstructive sleep apnea (adult) (pediatric): Secondary | ICD-10-CM

## 2024-06-30 DIAGNOSIS — J449 Chronic obstructive pulmonary disease, unspecified: Secondary | ICD-10-CM

## 2024-06-30 DIAGNOSIS — F4310 Post-traumatic stress disorder, unspecified: Secondary | ICD-10-CM

## 2024-06-30 DIAGNOSIS — Z8669 Personal history of other diseases of the nervous system and sense organs: Secondary | ICD-10-CM

## 2024-07-02 NOTE — Progress Notes (Signed)
 Patient Care Team: Windy Coy, MD as PCP - General (Family Medicine) Loni Soyla LABOR, MD as PCP - Cardiology (Cardiology) Lanny Callander, MD as Consulting Physician (Hematology) Dianna Specking, MD as Consulting Physician (Gastroenterology) Elner Arley LABOR, MD as Consulting Physician (Ophthalmology) Rosslyn Dino HERO, MD as Consulting Physician (Neurosurgery)  Clinic Day:  07/03/2024   Referring physician: Windy Coy, MD  ASSESSMENT & PLAN:   Assessment & Plan: Breast cancer of upper-outer quadrant of right female breast [facility])  Right breast invasive ductal carcinoma, pT1bN0M0, stage IA, grade 1, ER positive, PR positive, HER-2 negative, (+) DCIS and ADH -Diagnosed in 2016. S/p right lumpectomy and radiation. She declined antihormonal therapy, now on surveillance -most recent mammogram on 03/01/22 was negative. -aside from persistent breast tenderness, she is clinically doing well. Her physical exam showed tenderness just below her previous incision site but was otherwise unremarkable. There is no clinical concern for recurrence. -Continue surveillance.  -mammogram due later this month. Ordered as part of today's visit.  -F/u in 12 months    Dysuria  Patient c/o urinary frequency and urgency over the past 4 days. She denies dysuria. Urine dip positive for evidence of infection  today. Start macrobid  100 mg twice daily for next 7 days. Will send urine for culture and sensitivity and adjust antibiotics as indicated.   Right breast cancer - ER/PR + Patient on breast cancer surveillance. Due  to have screening mammogram. This was ordered today. No changes, masses, or new lumps reported today. Exam benign. Follow up in 1 year.   Plan Labs reviewed.  - cbc unremarkable.  -ferritin and iron   panels unremarkable.  -cmp unremarkable other than elevated blood sugar.  Treat for uti with antibiotics.  Screening mammogram ordered today.  Will gather bone density results.  Plan for  labs and follow up in 1 year.   The patient understands the plans discussed today and is in agreement with them.  She knows to contact our office if she develops concerns prior to her next appointment.  I provided 30 minutes of face-to-face time during this encounter and > 50% was spent counseling as documented under my assessment and plan.    Powell FORBES Lessen, NP  [facility] - A DEPT OF [facility] [address] [place] [address] Dept: [phone] Dept Fax: [phone]   Orders Placed This Encounter  Procedures   MM 3D SCREENING MAMMOGRAM BILATERAL BREAST    [employer] [facility];  07/05/2023 @[facility]  NO NEED- NO ISSUES- NO HX OF BR CA-NO IMPLANTS- NO REDUCTIONS SPOKE WITH PT/  PT AWARE OF NO SHOW FEE 75.00    Standing Status:   Future    Expected Date:   07/10/2024    Expiration Date:   07/03/2025    Reason for Exam (SYMPTOM  OR DIAGNOSIS REQUIRED):   screening for breast cancer, history of right breast cancer4    Preferred imaging location?:   [facility]      CHIEF COMPLAINT:  CC: Right breast cancer, ER +  Current Treatment: Breast cancer surveillance; IV iron  as needed  INTERVAL HISTORY:  Jessica Roberts is here today for repeat clinical assessment.  She last saw me on 07/01/2023. Patient reports urinary frequency and urgency for the  past 4 days. She denies abdominal pain. Denies dysuria. She denies chest pain, chest pressure, or shortness of breath. She denies headaches or visual disturbances. She denies abdominal pain, nausea, vomiting, or changes in bowel.  She denies fevers or chills. She denies pain. Her appetite is good. Her weight has been stable.  I have reviewed the past medical history, past surgical history, social history and family history with the patient and they are unchanged from previous note.  ALLERGIES:  is allergic to codeine, oxycodone , oxycontin  [oxycodone  hcl], percocet  [oxycodone -acetaminophen ], and percodan [oxycodone -aspirin ].  MEDICATIONS:  Current Outpatient Medications  Medication Sig Dispense Refill   acetaminophen  (TYLENOL ) 500 MG tablet Take 500 mg by mouth 2 (two) times daily as needed for moderate pain (pain). Reported on 10/12/2015     albuterol  (PROVENTIL  HFA;VENTOLIN  HFA) 108 (90 Base) MCG/ACT inhaler Inhale 1 puff into the lungs every 6 (six) hours as needed for wheezing or shortness of breath.     buPROPion ER (WELLBUTRIN SR) 100 MG 12 hr tablet Take 100 mg by mouth 2 (two) times daily.     diazepam (VALIUM) 5 MG tablet Take 5 mg by mouth every 6 (six) hours as needed for anxiety. As needed     DULoxetine (CYMBALTA) 20 MG capsule Take 20 mg by mouth daily.     ezetimibe (ZETIA) 10 MG tablet Take 10 mg by mouth daily.     Fluticasone-Umeclidin-Vilant (TRELEGY ELLIPTA) 200-62.5-25 MCG/ACT AEPB Inhale 1 puff into the lungs daily as needed.     nitrofurantoin , macrocrystal-monohydrate, (MACROBID ) 100 MG capsule Take 1 capsule (100 mg total) by mouth 2 (two) times daily. 14 capsule 0   pantoprazole  (PROTONIX ) 40 MG tablet Take 1 tablet every day by oral route.     prazosin (MINIPRESS) 2 MG capsule Take 2 mg by mouth at bedtime.     rosuvastatin (CRESTOR) 40 MG tablet Take 40 mg by mouth daily.     Semaglutide,0.25 or 0.5MG /DOS, (OZEMPIC, 0.25 OR 0.5 MG/DOSE,) 2 MG/1.5ML SOPN Inject 0.5 mLs into the skin every 7 (seven) days.     sulfamethoxazole-trimethoprim (BACTRIM DS) 800-160 MG tablet Take 1 tablet by mouth 2 (two) times daily.     SUMAtriptan (IMITREX) 100 MG tablet Take by mouth. As needed for Migraine     traZODone (DESYREL) 50 MG tablet      TRELEGY ELLIPTA 200-62.5-25 MCG/ACT AEPB Inhale 1 puff into the lungs daily.     metroNIDAZOLE (METROGEL) 0.75 % gel metronidazole 0.75 % topical gel (Patient not taking: Reported on 07/03/2024)     No current facility-administered medications for this visit.    HISTORY OF PRESENT ILLNESS:   Oncology  History Overview Note  Cancer Staging Breast cancer of upper-outer quadrant of right female breast [facility]) Staging form: Breast, AJCC 7th Edition - Clinical stage from 04/07/2015: Stage IA (T1a, N0, M0) - Signed by Lanny Callander, MD on 05/20/2015 - Pathologic stage from 04/27/2015: Stage IA (T1b, N0, cM0) - Signed by Lanny Callander, MD on 05/20/2015       Breast cancer of upper-outer quadrant of right female breast (HCC)  04/01/2015 Mammogram   Diagnostic mammogram and US  showed a 0.7x 0.4 x 0.4 cm mass in the right breast 11:00 position, and a second 5mm lesion at upper-midline, and a benign cluster of cysts measuring 1cm.    04/07/2015 Initial Biopsy   Right breast UOQ mass core needle biopsy showed invasive ductal carcinoma and DCIS, grade 1. ER+ (100%), PR+ (80%), HER2/neu negative (ratio 1.25), Ki67 5%.   04/07/2015 Clinical Stage   Stage IA: T1a N0   04/27/2015 Definitive Surgery   Right breast double lumpectomy/SLNB: IDC, 0.9 cm, DCIS, invasive dz 0.1 cm from posterior margin, rem.  margins >0.5 cm; second lump: fibrocystic change. 1 LN negative for malignancy   04/27/2015 Pathologic Stage   Stage IA: T1b N0   06/21/2015 - 07/13/2015 Radiation Therapy   Adjuvant RT: Right breast 42.72 Gy over 21 fractions.     Anti-estrogen oral therapy   Pt declined adjuvant anti-estrogen therapy    09/29/2015 Survivorship   Survivorship visit completed and copy of care plan given to patient.   07/17/2016 Imaging   MM DIAG BREAST TOMO BILATERALL 07/17/16 MPRESSION: Probable mildly complicated fluid collection/seroma within the right breast 1130 o'clock along the posterior margin of the lumpectomy site.   09/04/2016 Imaging   US  BREAST LTD UNI RIGHT INC AXILLA: 09/04/16 IMPRESSION: Stable probably benign postsurgical fluid collection/seroma within the right breast at the 11:30 o'clock axis, 6 cm from the nipple, measuring 2.3 x 0.9 x 1.2 cm. Recommend additional follow-up right breast diagnostic  mammogram and ultrasound in 6 months to ensure continued stability.   03/21/2017 Mammogram   R breast Mammogram and US   Mammographic images were processed with CAD.   Targeted ultrasound is performed, showing slightly smaller mixed echogenicity collection within the right breast at 11:30 o'clock 6 cm from nipple measuring 0.9 x 0.7 x 1.4 cm. This is probably a postsurgical seroma.   07/31/2017 Mammogram   IMPRESSION: 1. Indeterminate 9 mm group of fine pleomorphic calcifications involving the upper outer quadrant of the right breast at posterior depth. 2. New partially obscured mass involving the inner left breast at anterior posterior depth.   08/07/2017 Breast US    Targeted right breast ultrasound is performed, showing the previously identified mildly complex fluid collection at the lumpectomy site at the 11:30 o'clock approximately 6 cm from the nipple has decreased in size in the interval, currently measuring 6 x 8 x 8 mm (previously 7 x 9 by 14 mm). No new suspicious solid mass or abnormal acoustic shadowing is identified.   Targeted left breast ultrasound is performed, showing that the new mammographic mass corresponds to an oval circumscribed parallel hypoechoic mass at the 10 o'clock position approximately 4 cm from the nipple measuring approximately 5 x 4 x 5 mm, demonstrating slight posterior acoustic enhancement and no internal power Doppler flow.   IMPRESSION: 1. Interval decrease in size of the postoperative seroma at the lumpectomy site in the upper outer quadrant of the right breast. 2. Likely benign 5 mm complex cyst in the upper inner quadrant of the left breast accounting for the new mammographic finding.   08/07/2017 Procedure   aspiration of the complex cyst in the upper inner quadrant of the left breast at the 10 o'clock position approximately 4 cm from the nipple. Less than 1 cc of cyst fluid was aspirated. The cyst completely collapsed with aspiration and there is no  associated solid component.   08/07/2017 Pathology Results   Diagnosis Breast, left, needle core biopsy, UIQ at posterior depth - BENIGN BREAST TISSUE WITH CALCIFICATIONS. - NO MALIGNANCY IDENTIFIED. - SEE COMMENT.    03/10/2018 Mammogram    03/10/2018 Mammogram IMPRESSION: No mammographic evidence of malignancy involving the LEFT breast.       REVIEW OF SYSTEMS:   Constitutional: Denies fevers, chills or abnormal weight loss Eyes: Denies blurriness of vision Ears, nose, mouth, throat, and face: Denies mucositis or sore throat Respiratory: Denies cough, dyspnea or wheezes Cardiovascular: Denies palpitation, chest discomfort or lower extremity swelling Gastrointestinal:  Denies nausea, heartburn or change in bowel habits Skin: Denies abnormal skin rashes Lymphatics: Denies new lymphadenopathy  or easy bruising Neurological:Denies numbness, tingling or new weaknesses Behavioral/Psych: Mood is stable, no new changes  All other systems were reviewed with the patient and are negative.   VITALS:   Today's Vitals   07/03/24 1431 07/03/24 1433  BP: 118/78   Pulse: 73   Resp: 17   Temp: 98 F (36.7 C)   SpO2: 98%   Weight: 176 lb 12.8 oz (80.2 kg)   PainSc:  0-No pain   Body mass index is 28.54 kg/m.   Wt Readings from Last 3 Encounters:  07/03/24 176 lb 12.8 oz (80.2 kg)  04/28/24 173 lb (78.5 kg)  04/07/24 178 lb 12.8 oz (81.1 kg)    Body mass index is 28.54 kg/m.  Performance status (ECOG): 2 - Symptomatic, <50% confined to bed  PHYSICAL EXAM:   GENERAL:alert, no distress and comfortable SKIN: skin color, texture, turgor are normal, no rashes or significant lesions EYES: normal, Conjunctiva are pink and non-injected, sclera clear OROPHARYNX:no exudate, no erythema and lips, buccal mucosa, and tongue normal  NECK: supple, thyroid  normal size, non-tender, without nodularity LYMPH:  no palpable lymphadenopathy in the cervical, axillary or inguinal LUNGS: clear  to auscultation and percussion with normal breathing effort HEART: regular rate & rhythm and no murmurs and no lower extremity edema ABDOMEN:abdomen soft, non-tender and normal bowel sounds Musculoskeletal:no cyanosis of digits and no clubbing  NEURO: alert & oriented x 3 with fluent speech, no focal motor/sensory deficits BREAST: deferred   LABORATORY DATA:  I have reviewed the data as listed    Component Value Date/Time   NA 140 07/03/2024 1402   NA 141 04/01/2017 1523   K 3.8 07/03/2024 1402   K 3.8 04/01/2017 1523   CL 108 07/03/2024 1402   CO2 24 07/03/2024 1402   CO2 25 04/01/2017 1523   GLUCOSE 207 (H) 07/03/2024 1402   GLUCOSE 120 04/01/2017 1523   BUN 15 07/03/2024 1402   BUN 13.6 04/01/2017 1523   CREATININE 0.83 07/03/2024 1402   CREATININE 0.72 03/27/2024 0000   CREATININE 0.8 04/01/2017 1523   CALCIUM  9.2 07/03/2024 1402   CALCIUM  9.5 04/01/2017 1523   PROT 7.2 07/03/2024 1402   PROT 7.3 04/01/2017 1523   ALBUMIN 4.1 07/03/2024 1402   ALBUMIN 3.5 04/01/2017 1523   AST 28 07/03/2024 1402   AST 16 04/01/2017 1523   ALT 30 07/03/2024 1402   ALT 21 04/01/2017 1523   ALKPHOS 84 07/03/2024 1402   ALKPHOS 93 04/01/2017 1523   BILITOT 0.5 07/03/2024 1402   BILITOT <0.22 04/01/2017 1523   GFRNONAA >60 07/03/2024 1402   GFRAA >60 04/19/2020 1114    Lab Results  Component Value Date   WBC 6.6 07/03/2024   NEUTROABS 4.2 07/03/2024   HGB 12.1 07/03/2024   HCT 36.3 07/03/2024   MCV 90.5 07/03/2024   PLT 230 07/03/2024

## 2024-07-02 NOTE — Assessment & Plan Note (Addendum)
 Right breast invasive ductal carcinoma, pT1bN0M0, stage IA, grade 1, ER positive, PR positive, HER-2 negative, (+) DCIS and ADH -Diagnosed in 2016. S/p right lumpectomy and radiation. She declined antihormonal therapy, now on surveillance -most recent mammogram on 03/01/22 was negative. -aside from persistent breast tenderness, she is clinically doing well. Her physical exam showed tenderness just below her previous incision site but was otherwise unremarkable. There is no clinical concern for recurrence. -Continue surveillance.  -mammogram due later this month. Ordered as part of today's visit.  -F/u in 12 months

## 2024-07-03 ENCOUNTER — Inpatient Hospital Stay: Attending: Nurse Practitioner | Admitting: Nurse Practitioner

## 2024-07-03 ENCOUNTER — Inpatient Hospital Stay

## 2024-07-03 ENCOUNTER — Ambulatory Visit: Payer: Self-pay | Admitting: Nurse Practitioner

## 2024-07-03 ENCOUNTER — Inpatient Hospital Stay (HOSPITAL_BASED_OUTPATIENT_CLINIC_OR_DEPARTMENT_OTHER)

## 2024-07-03 ENCOUNTER — Other Ambulatory Visit: Payer: Self-pay

## 2024-07-03 VITALS — BP 118/78 | HR 73 | Temp 98.0°F | Resp 17 | Wt 176.8 lb

## 2024-07-03 DIAGNOSIS — Z853 Personal history of malignant neoplasm of breast: Secondary | ICD-10-CM | POA: Insufficient documentation

## 2024-07-03 DIAGNOSIS — Z79899 Other long term (current) drug therapy: Secondary | ICD-10-CM | POA: Insufficient documentation

## 2024-07-03 DIAGNOSIS — R350 Frequency of micturition: Secondary | ICD-10-CM | POA: Insufficient documentation

## 2024-07-03 DIAGNOSIS — Z170 Estrogen receptor positive status [ER+]: Secondary | ICD-10-CM

## 2024-07-03 DIAGNOSIS — C50411 Malignant neoplasm of upper-outer quadrant of right female breast: Secondary | ICD-10-CM

## 2024-07-03 DIAGNOSIS — R3915 Urgency of urination: Secondary | ICD-10-CM | POA: Insufficient documentation

## 2024-07-03 DIAGNOSIS — D509 Iron deficiency anemia, unspecified: Secondary | ICD-10-CM | POA: Insufficient documentation

## 2024-07-03 DIAGNOSIS — R300 Dysuria: Secondary | ICD-10-CM | POA: Diagnosis not present

## 2024-07-03 DIAGNOSIS — Z08 Encounter for follow-up examination after completed treatment for malignant neoplasm: Secondary | ICD-10-CM | POA: Insufficient documentation

## 2024-07-03 LAB — IRON AND IRON BINDING CAPACITY (CC-WL,HP ONLY)
Iron: 62 ug/dL (ref 28–170)
Saturation Ratios: 17 % (ref 10.4–31.8)
TIBC: 370 ug/dL (ref 250–450)
UIBC: 308 ug/dL

## 2024-07-03 LAB — CBC WITH DIFFERENTIAL (CANCER CENTER ONLY)
Abs Immature Granulocytes: 0.01 K/uL (ref 0.00–0.07)
Basophils Absolute: 0.1 K/uL (ref 0.0–0.1)
Basophils Relative: 1 %
Eosinophils Absolute: 0.3 K/uL (ref 0.0–0.5)
Eosinophils Relative: 4 %
HCT: 36.3 % (ref 36.0–46.0)
Hemoglobin: 12.1 g/dL (ref 12.0–15.0)
Immature Granulocytes: 0 %
Lymphocytes Relative: 26 %
Lymphs Abs: 1.7 K/uL (ref 0.7–4.0)
MCH: 30.2 pg (ref 26.0–34.0)
MCHC: 33.3 g/dL (ref 30.0–36.0)
MCV: 90.5 fL (ref 80.0–100.0)
Monocytes Absolute: 0.4 K/uL (ref 0.1–1.0)
Monocytes Relative: 6 %
Neutro Abs: 4.2 K/uL (ref 1.7–7.7)
Neutrophils Relative %: 63 %
Platelet Count: 230 K/uL (ref 150–400)
RBC: 4.01 MIL/uL (ref 3.87–5.11)
RDW: 13.8 % (ref 11.5–15.5)
WBC Count: 6.6 K/uL (ref 4.0–10.5)
nRBC: 0 % (ref 0.0–0.2)

## 2024-07-03 LAB — CMP (CANCER CENTER ONLY)
ALT: 30 U/L (ref 0–44)
AST: 28 U/L (ref 15–41)
Albumin: 4.1 g/dL (ref 3.5–5.0)
Alkaline Phosphatase: 84 U/L (ref 38–126)
Anion gap: 8 (ref 5–15)
BUN: 15 mg/dL (ref 8–23)
CO2: 24 mmol/L (ref 22–32)
Calcium: 9.2 mg/dL (ref 8.9–10.3)
Chloride: 108 mmol/L (ref 98–111)
Creatinine: 0.83 mg/dL (ref 0.44–1.00)
GFR, Estimated: >60 mL/min (ref >60)
Glucose, Bld: 207 mg/dL — ABNORMAL HIGH (ref 70–99)
Potassium: 3.8 mmol/L (ref 3.5–5.1)
Sodium: 140 mmol/L (ref 135–145)
Total Bilirubin: 0.5 mg/dL (ref 0.0–1.2)
Total Protein: 7.2 g/dL (ref 6.5–8.1)

## 2024-07-03 LAB — URINALYSIS, COMPLETE (UACMP) WITH MICROSCOPIC
Bacteria, UA: NONE SEEN
Bilirubin Urine: NEGATIVE
Glucose, UA: NEGATIVE mg/dL
Ketones, ur: NEGATIVE mg/dL
Nitrite: NEGATIVE
Protein, ur: 100 mg/dL — AB
Specific Gravity, Urine: 1.021 (ref 1.005–1.030)
WBC, UA: >50 WBC/hpf (ref 0–5)
pH: 5 (ref 5.0–8.0)

## 2024-07-03 LAB — FERRITIN: Ferritin: 23 ng/mL (ref 11–307)

## 2024-07-03 MED ORDER — NITROFURANTOIN MONOHYD MACRO 100 MG PO CAPS: 100.0000 mg | ORAL_CAPSULE | Freq: Two times a day (BID) | ORAL | 0 refills | Status: AC

## 2024-07-03 NOTE — Progress Notes (Signed)
 Patient suffering fro depression but is in counseling at this time.

## 2024-07-12 ENCOUNTER — Encounter: Payer: Self-pay | Admitting: Hematology

## 2024-07-12 ENCOUNTER — Encounter: Payer: Self-pay | Admitting: Nurse Practitioner

## 2024-07-13 ENCOUNTER — Ambulatory Visit: Payer: Self-pay | Admitting: Neurology

## 2024-07-13 DIAGNOSIS — G4733 Obstructive sleep apnea (adult) (pediatric): Secondary | ICD-10-CM

## 2024-07-13 NOTE — Procedures (Signed)
 [facility] PAP TITRATION INTERPRETATION REPORT   STUDY DATE: 06/30/2024      PATIENT NAME:  Jessica Roberts         DATE OF BIRTH:  10-02-1945  PATIENT ID:  [id number]    TYPE OF STUDY:  CPAP  READING PHYSICIAN: DEDRA GORES, MD SCORING TECHNICIAN: Jesusa Haddock, RPSGT   HISTORY: This 78 year-old patient of Dr Chancy and Dr Magnus, was referred to be re-evaluated for sleep apnea, hypoxia. History of remote but heavy smoking, COPD, and of PTSD, Jessica Roberts is a 78 y.o. female patientwho is seen upon referral on 02/25/2024 ( Dr Windy) for a sleep consult. She is awaiting a revisit with Dr Margaret for 2 falls, sudden falls without LOC.  She has COPD, sleep hypoxia and is reportedly not able to use CPAP. She is not excessively sleepy but fatigued. She reports PLms/ RLS.  Chief?concern?according to patient : ? I was diagnosed with mild sleep apnea the patient reports that it was obstructive sleep apnea approximately 2019 at the time the AHI was 9.6/h and CPAP was prescribed but I could not tolerate it. I do also have diabetes and depression and Dr. Windy would like me to be reevaluated and see if there are other ways to treat my apnea and if they continue to have apnea at all. recently had 2 episodes of sudden falls, no LOC, the second one with confusion, aphasia. ED and PCP work ups were negative for stroke.  Here for possible cataplexy?  HST is easiest to accomplish but will not address any RLS or PTSD/ Parasomnia. She is taking prazosin and trazodone. She takes wellbutrin in AM 100 mg. has COPD, is on Albuterol  inhaler.  The patient was in agreement with a HST to be performed first  HST on 04-21-2024 was positive for OSA and som hypoxia. Calculated pAHI (per CMS guideline): 17.1/h REM pAHI: 35/h?????????????????????????????????????????NREM pAHI: 11.4/h Positional AHI: Supine 22.6/h versus non-supine AHI of 7.3 / h  The Epworth  Sleepiness Scale :7 out of 24 (scores above or equal to 10 are suggestive of hypersomnolence).  FSS at 54/ 63 points, GDS at 6/ 15 points.   DESCRIPTION: A sleep technologist was in attendance for the duration of the recording.  Data collection, scoring, video monitoring, and reporting were performed in compliance with the AASM Manual for the Scoring of Sleep and Associated Events; (Hypopnea is scored based on the criteria listed in Section VIII D. 1b in the AASM Manual V2.6 using a 4% oxygen desaturation rule or Hypopnea is scored based on the criteria listed in Section VIII D. 1a in the AASM Manual V2.6 using 3% oxygen desaturation and /or arousal rule).  A physician certified by the American Board of Sleep Medicine reviewed each epoch of the study.  ADDITIONAL INFORMATION:  Height: 65.5 in Weight: 171 lb (BMI 28) Neck Size: 15.5 in  MEDICATIONS: Tylenol , Proventil , Amoxil , Wellbutrin XL, Zetia, Metrogel, Protonix , Minipress, Crestor, Imitrex, Desyrel, Trelegy Ellipta  SLEEP CONTINUITY AND SLEEP ARCHITECTURE: [employer] Evora FFM in S/M  size.  Study started at 6 cm water CPAP and explored up to 7 cm water with no need to further titrate.   Lights off was at 22:28: and lights on 05:05: (6.6 hours in bed). Total sleep time was 286.0 minutes (0.0% supine;  100.0% lateral;  0.0% prone, 5.2% REM sleep), with a decreased sleep efficiency at 71.9%.  Sleep latency was82.0 minutes.   Of the total sleep time, the  percentage of stage N1 sleep was 0.3%, stage N2 sleep was 72.6%, stage N3 sleep was 21.9%, and REM sleep was 5.2%.  There were 1 Stage R periods observed on this study night, 7 awakenings (i.e. transitions to Stage W from any sleep stage), and 27.0 total stage transitions. Wake after sleep onset (WASO) time accounted for 29 minutes.  AROUSAL: There were 16 arousals in total, for an arousal index of 3.4 arousals/hour.  Of these, 0 were identified as respiratory-related arousals (0.0 /h), 0 were  PLM-related arousals (0.0 /h), and 20 were non-specific arousals (4.2 /h)  RESPIRATORY MONITORING:  Based on CMS criteria (using a 4% oxygen desaturation rule for scoring hypopneas), there were 0 apneas (0 obstructive; 0 central; 0 mixed), and 1 hypopneas.  The Apnea index was 0.0. Hypopnea index was 0.2.  The AHI (apnea-hypopnea index) was 0.2/h overall (0.0 supine, 0.0 non-supine; 0.0 REM, 0.0 supine REM).  There were 0 respiratory effort-related arousals (RERAs).   OXIMETRY: Total sleep time spent at, or below 88% was 0.5 minutes, or 0.2% of total sleep time. Respiratory events were associated with oxyhemoglobin desaturations (nadir during sleep 79% from a mean of 92%). EKG: The electrocardiogram documented normal sinus rhythm . The average heart rate during sleep was 65 bpm.  The maximum heart rate during sleep was 74 bpm. The maximum heart rate during recording was 82.    BODY POSITION: Duration of total sleep and percent of total sleep in their respective position is as follows: supine 00 minutes (0.0%), non-supine 286.0 minutes (100.0%); right 57 minutes (19.9%), left 229 minutes (80.1%), and prone 00 minutes (0.0%).  Total supine REM sleep time was 00 minutes (0.0% of total REM sleep). LIMB MOVEMENTS: There were 90 periodic limb movements of sleep (18.9/h), of which 0 (0.0/h) were associated with an arousal.  IMPRESSION:   1.Moderate degree of OSA responded well to 7 cm water pressure , reaching an  AHI of 0.7/h  2. Total sleep time was within normal limits.   PLMs were noted in clusters, not in REM sleep, not causing arousals.      RECOMMENDATIONS: I recommend to start CPAP at 5-8 cm water pressure, with  heated humidification and use of an EVORA mask S/M.  The patient had excellent uninterrupted sleep under these low pressure settings.  Follow up in 90-120 days , bring CPAP machine and all attachments to [facility] sleep clinic.   DEDRA GORES,  MD Dedra Gores, MD  [facility] Sleep Board certified by The [employer] of Sleep Medicine and Diplomate of the [employer] of Sleep Medicine. Board certified In Neurology through the ABPN, Fellow of the [employer] of Neurology.  ?            [facility] CPAP/Bilevel Report    General Information  Name: Malaia, Buchta BMI: 28 Physician: Gores Dedra   ID: [id number] Height: 65 in Technician: Harvey Brisker  Sex: Unknown Weight: 171 lb Record: [id number]  Age: 22 [[birth year]] Date: 06/30/2024 Scorer: Brisker Harvey   Recommended Settings IPAP: N/A cmH20 EPAP: N/A cmH2O AHI: N/A AHI (4%): N/A   Pressure IPAP/EPAP 00 05 06 07   O2 Vol 0.0 0.0 0.0 0.0  Time TRT 0.33m 57.26m 156.15m 184.64m   TST 0.57m 0.49m 103.65m 183.72m  Sleep Stage % Wake 0.0 100.0 34.0 0.8   % REM 0.0 0.0 0.0 8.2   % N1 0.0 0.0 1.0 0.0   % N2 0.0 0.0  60.2 79.5   % N3 0.0 0.0 38.8 12.3  Respiratory Total Events 0 0 2 2   Obs. Apn. 0 0 0 0   Mixed Apn. 0 0 0 0   Cen. Apn. 0 0 0 0   Hypopneas 0 0 2 2   AHI 0.00 0.00 1.17 0.66   Supine AHI 0.00 0.00 0.00 0.00   Prone AHI 0.00 0.00 0.00 0.00   Side AHI 0.00 0.00 1.17 0.66  Respiratory (4%) Hypopneas (4%) 0.00 0.00 1.00 0.00   AHI (4%) 0.00 0.00 0.58 0.00   Supine AHI (4%) 0.00 0.00 0.00 0.00   Prone AHI (4%) 0.00 0.00 0.00 0.00   Side AHI (4%) 0.00 0.00 0.58 0.00  Desat Profile <= 90% 0.51m 0.13m 12.36m 32.53m   <= 80% 0.27m 0.31m 0.48m 0.74m   <= 70% 0.41m 0.3m 0.43m 0.71m   <= 60% 0.69m 0.70m 0.27m 0.54m  Arousal Index Apnea 0.0 0.0 0.0 0.0   Hypopnea 0.0 0.0 0.0 0.0   LM 0.0 0.0 0.0 0.0   Spontaneous 0.0 0.0 5.8 3.3

## 2024-08-07 ENCOUNTER — Ambulatory Visit

## 2024-08-20 ENCOUNTER — Ambulatory Visit

## 2024-08-28 ENCOUNTER — Other Ambulatory Visit

## 2024-08-28 LAB — TSH: TSH: 4.43 m[IU]/L (ref 0.40–4.50)

## 2024-08-28 LAB — PROLACTIN: Prolactin: 36 ng/mL — ABNORMAL HIGH

## 2024-08-28 LAB — T4, FREE: Free T4: 0.6 ng/dL — ABNORMAL LOW (ref 0.8–1.8)

## 2024-09-03 ENCOUNTER — Ambulatory Visit: Admitting: "Endocrinology

## 2024-09-03 ENCOUNTER — Encounter: Payer: Self-pay | Admitting: "Endocrinology

## 2024-09-03 VITALS — BP 124/70 | HR 84 | Ht 66.0 in | Wt 174.0 lb

## 2024-09-03 DIAGNOSIS — R7309 Other abnormal glucose: Secondary | ICD-10-CM

## 2024-09-03 DIAGNOSIS — D352 Benign neoplasm of pituitary gland: Secondary | ICD-10-CM | POA: Diagnosis not present

## 2024-09-03 DIAGNOSIS — Z6828 Body mass index (BMI) 28.0-28.9, adult: Secondary | ICD-10-CM | POA: Diagnosis not present

## 2024-09-03 DIAGNOSIS — E041 Nontoxic single thyroid nodule: Secondary | ICD-10-CM | POA: Diagnosis not present

## 2024-09-03 DIAGNOSIS — E663 Overweight: Secondary | ICD-10-CM

## 2024-09-03 LAB — POCT GLYCOSYLATED HEMOGLOBIN (HGB A1C): Hemoglobin A1C: 6.6 % — AB (ref 4.0–5.6)

## 2024-09-03 MED ORDER — BLOOD GLUCOSE TEST VI STRP: 1.0000 | ORAL_STRIP | Freq: Three times a day (TID) | 3 refills | Status: AC

## 2024-09-03 MED ORDER — LANCETS MISC: 1.0000 | 0 refills | Status: AC

## 2024-09-03 MED ORDER — LANCET DEVICE MISC: 1.0000 | Freq: Three times a day (TID) | 0 refills | Status: AC

## 2024-09-03 MED ORDER — OZEMPIC (0.25 OR 0.5 MG/DOSE) 2 MG/3ML ~~LOC~~ SOPN: 0.2500 mg | PEN_INJECTOR | SUBCUTANEOUS | 1 refills | Status: AC

## 2024-09-03 MED ORDER — BLOOD GLUCOSE MONITORING SUPPL DEVI: 1.0000 | Freq: Three times a day (TID) | 0 refills | Status: AC

## 2024-09-03 NOTE — Progress Notes (Signed)
 "   Outpatient Endocrinology Note Obadiah Birmingham, MD    Jessica Roberts Jan 02, 1946 [id number]  Referring Provider: Windy Coy, MD Primary Care Provider: Windy Coy, MD Reason for consultation: Subjective   Assessment & Plan  Cedricka Sackrider was seen today for pituitary.  Diagnoses and all orders for this visit:  Pituitary macroadenoma (HCC)  Thyroid  nodule  Overweight (BMI 25.0-29.9) -     POCT glycosylated hemoglobin (Hb A1C)  Elevated hemoglobin A1c  Other orders -     Blood Glucose Monitoring Suppl DEVI; 1 each by Does not apply route in the morning, at noon, and at bedtime. May substitute to any manufacturer covered by patient's insurance. -     Glucose Blood (BLOOD GLUCOSE TEST STRIPS) STRP; 1 each by In Vitro route in the morning, at noon, and at bedtime. May substitute to any manufacturer covered by patient's insurance. -     Lancet Device MISC; 1 each by Does not apply route in the morning, at noon, and at bedtime. May substitute to any manufacturer covered by patient's insurance. -     Lancets MISC; 1 each by Does not apply route as directed. Dispense based on patient and insurance preference. Use up to four times daily as directed. (FOR ICD-10 E10.9, E11.9). -     Semaglutide ,0.25 or 0.5MG /DOS, (OZEMPIC , 0.25 OR 0.5 MG/DOSE,) 2 MG/3ML SOPN; Inject 0.25 mg into the skin once a week.    Incidentally found pituitary macroadenoma in 11/2023 MRI brain: The patient has developed a mass filling the sella and bulging into the suprasellar region consistent with a pituitary adenoma. This measures 2.0 x 1.5 x 1.6 cm. Definite cavernous sinus extension is not seen. The mass approaches the optic chiasm but does not definitely compress the optic chiasm. 03/27/24: baseline 8 AM pituitary workup showed mildly high prolactin, likely from stalk effect and low FT4 with normal TSH, the utility of which is unclear. Will repeat the labs next visit. No treatment indicated at  this time.   04/28/24: Saw Dr Rosslyn Neurosurgeon: Recommended continued monitoring 04/28/24: MRI BRAIN WITH AND WITHOUT CONTRAST: Intracellular/suprasellar mass measures 2.1 x 1.6 x 2.0 cm. Compared to the prior study, the mass is unchanged in size. There is contact of the undersurface of the optic chiasm. The infundibulum is uplifted and mildly deviated to the right. There is grade 1 invasion of the left cavernous sinus. Patient reports she had visual field testing done around 04/2024  and was WNL 08/28/24: Cholectomy mildly elevated though improved from last time, likely from stalk effect.  TSH WNL, free T4 on the low side at 0.6, continue monitoring Next pituitary full lab panel due in 03/2025  Incidentally found on 11/2023 CT ANGIOGRAPHY HEAD AND NECK WITH AND WITHOUT CONTRAST: 1.5 cm nodule at the lower pole of the right lobe of the thyroid . 03/12/24: Baseline thyroid  ultrasound: TR3 Nodule # 1: Location: RIGHT; inferior 2.5 cm x 1.7 x 1.6 cm: ordered FN. 04/2024: FNA: Benign follicular nodule (Bethesda category II)  Repeat thyroid  U/S in 04/2025  Patient is here after her PCP stopped filling her Ozempic  0.5 mg and sent her here A1c drawn today is 6.6% Patient would like to resume her Ozempic  to help with her weight loss No history of MEN syndrome/medullary thyroid  cancer/pancreatitis or pancreatic cancer in self or family Ordered Ozempic  0.25 mg to be taken for 2 weeks followed by 0.5 mg if no side effects Discussed side effects at length Patient had triglycerides in moderately elevated range, patient will  repeat her labs if not already done, patient filled out the patient release form to chase her labs from [place] done in the past 10 days Instructed to start taking fish oil for elevated history of hypertriglyceridemia   No follow-ups on file.   I have reviewed current medications, nurse's notes, allergies, vital signs, past medical and surgical history, family medical history, and social  history for this encounter. Counseled patient on symptoms, examination findings, lab findings, imaging results, treatment decisions and monitoring and prognosis. The patient understood the recommendations and agrees with the treatment plan. All questions regarding treatment plan were fully answered.  Obadiah Birmingham, MD  09/03/24   History of Present Illness HPI  SEMAJ KHAM is a 79 y.o. year old female who presents for evaluation of pituitary macroadenoma found incidentally on brain MRI done in 11/2023.  She doesn't check her blood sugars at home She was on ozempic  and ran out a month ago, was on 0.5 mg/week No head aches, no vision problems No breast discharge Reports average energy, has issues with bowels   12/2023: records reviewed: Lab showed total cholestesrol 176 (at goal <200), HDL 53 (at goal >40), triglycerides 333 (not at goal <150), LDL 83 (not at goal <70).    Initial history: Gets head aches everyday, unchanged, diagnosed as migraines  No visual issues Saw ophthalmologist, reports issues   No galactorrhea, is post-menopausal No new symptoms  Saw Dr Rosslyn Neurosurgeon, pending repeat MRI   Initial history:  11/30/23 MRI brain WO contrast: The patient has developed a mass filling the sella and bulging into the suprasellar region consistent with a pituitary adenoma. This measures 2.0 x 1.5 x 1.6 cm. Definite cavernous sinus extension is not seen. The mass approaches the optic chiasm but does not definitely compress the optic chiasm.  She reports the following;  Headaches Yes, migraines  visual blurring/ diplopia/ fields defect No Galactorrhea No  change in facial appearance  No body habitus No change in her hand, ring, hat, shoe size  No hyperhidrosis No arthralgias No, baseline left knee and L ankle pain since 6-8 weeks   fatigue Yes weight change Yes, on ozempic , lot about 10 lbs  change in appetite Yes, since on ozempic   heat/cold intolerance Yes, subtle   change in bowel movements Yes, constipation  change in muscle strength No changes in skin or hair No Palpitations No insomnia No tremor No  moon faces No fat pads Yes increased girth  Yes plethora hyperpigmentation No purple striae No acne No vellus/terminal hirsutism No proximal muscle weakness No nausea/vomiting No lightheadedness Yes abdominal pain No  hot flashes No night sweats No  Family history is negative for pituitary tumor or other abnormalities concerning for MEN syndrome.  Physical Exam  BP 124/70   Pulse 84   Ht 5' 6 (1.676 m)   Wt 174 lb (78.9 kg)   SpO2 95%   BMI 28.08 kg/m    Constitutional: well developed, well nourished Head: normocephalic, atraumatic Eyes: sclera anicteric, no redness Neck: supple, no thyromegaly/thyroid  tenderness/thyroid  nodules palpated Lungs: normal respiratory effort Neurology: alert and oriented Skin: dry, no appreciable rashes Musculoskeletal: no appreciable defects Psychiatric: normal mood and affect   Current Medications Patient's Medications  New Prescriptions   BLOOD GLUCOSE MONITORING SUPPL DEVI    1 each by Does not apply route in the morning, at noon, and at bedtime. May substitute to any manufacturer covered by patient's insurance.   GLUCOSE BLOOD (BLOOD GLUCOSE TEST STRIPS) STRP  1 each by In Vitro route in the morning, at noon, and at bedtime. May substitute to any manufacturer covered by patient's insurance.   LANCET DEVICE MISC    1 each by Does not apply route in the morning, at noon, and at bedtime. May substitute to any manufacturer covered by patient's insurance.   LANCETS MISC    1 each by Does not apply route as directed. Dispense based on patient and insurance preference. Use up to four times daily as directed. (FOR ICD-10 E10.9, E11.9).   SEMAGLUTIDE ,0.25 OR 0.5MG /DOS, (OZEMPIC , 0.25 OR 0.5 MG/DOSE,) 2 MG/3ML SOPN    Inject 0.25 mg into the skin once a week.  Previous Medications   ACETAMINOPHEN   (TYLENOL ) 500 MG TABLET    Take 500 mg by mouth 2 (two) times daily as needed for moderate pain (pain). Reported on 10/12/2015   ALBUTEROL  (PROVENTIL  HFA;VENTOLIN  HFA) 108 (90 BASE) MCG/ACT INHALER    Inhale 1 puff into the lungs every 6 (six) hours as needed for wheezing or shortness of breath.   BUPROPION ER (WELLBUTRIN SR) 100 MG 12 HR TABLET    Take 100 mg by mouth 2 (two) times daily.   DIAZEPAM (VALIUM) 5 MG TABLET    Take 5 mg by mouth every 6 (six) hours as needed for anxiety. As needed   DULOXETINE (CYMBALTA) 20 MG CAPSULE    Take 20 mg by mouth daily.   EZETIMIBE (ZETIA) 10 MG TABLET    Take 10 mg by mouth daily.   FLUTICASONE-UMECLIDIN-VILANT (TRELEGY ELLIPTA) 200-62.5-25 MCG/ACT AEPB    Inhale 1 puff into the lungs daily as needed.   METRONIDAZOLE (METROGEL) 0.75 % GEL    metronidazole 0.75 % topical gel   NITROFURANTOIN , MACROCRYSTAL-MONOHYDRATE, (MACROBID ) 100 MG CAPSULE    Take 1 capsule (100 mg total) by mouth 2 (two) times daily.   PANTOPRAZOLE  (PROTONIX ) 40 MG TABLET    Take 1 tablet every day by oral route.   PRAZOSIN (MINIPRESS) 2 MG CAPSULE    Take 2 mg by mouth at bedtime.   ROSUVASTATIN (CRESTOR) 40 MG TABLET    Take 40 mg by mouth daily.   SEMAGLUTIDE ,0.25 OR 0.5MG /DOS, (OZEMPIC , 0.25 OR 0.5 MG/DOSE,) 2 MG/1.5ML SOPN    Inject 0.5 mLs into the skin every 7 (seven) days.   SULFAMETHOXAZOLE-TRIMETHOPRIM (BACTRIM DS) 800-160 MG TABLET    Take 1 tablet by mouth 2 (two) times daily.   SUMATRIPTAN (IMITREX) 100 MG TABLET    Take by mouth. As needed for Migraine   TRAZODONE (DESYREL) 50 MG TABLET       TRELEGY ELLIPTA 200-62.5-25 MCG/ACT AEPB    Inhale 1 puff into the lungs daily.  Modified Medications   No medications on file  Discontinued Medications   No medications on file    Allergies Allergies  Allergen Reactions   Codeine Other (See Comments)    Nervous system dysfunction, cannot control or use limbs   Oxycodone  Nausea And Vomiting and Nausea Only   Oxycontin   [Oxycodone  Hcl] Other (See Comments)   Percocet [Oxycodone -Acetaminophen ] Nausea And Vomiting   Percodan [Oxycodone -Aspirin ] Nausea And Vomiting    Past Medical History Past Medical History:  Diagnosis Date   Allergy    Anxiety    Anxiety and depression    Breast cancer (HCC) 04/07/15   right breast   Breast cancer of upper-outer quadrant of right female breast (HCC) 04/11/2015   Broken nose    Complication of anesthesia    had a very hard  time waking up   Dengue fever    Depression    Fall    broken nose   GERD (gastroesophageal reflux disease)    Head injury 1995   concussion in [place], [place] evac to [place] , ear damage with vertigo   Head [place] with loss of consciousness [place]HCC[place]   Head [place] closed, with concussion 1986   [place]   Headache    Hypertension    Malaria    PONV (postoperative nausea and vomiting)    Typhoid     Past Surgical History Past Surgical History:  Procedure Laterality Date   BREAST LUMPECTOMY Right 04/27/2015   BREAST LUMPECTOMY WITH NEEDLE LOCALIZATION AND AXILLARY SENTINEL LYMPH NODE BX Right 04/27/2015   Procedure: RIGHT BREAST LUMPECTOMY WITH  TWO NEEDLE LOCALIZATION AND TWO RIGHT AXILLARY SENTINEL LYMPH NODE BX;  Surgeon: Elon Pacini, MD;  Location: [facility] OR;  Service: General;  Laterality: Right;   COLONOSCOPY W/ BIOPSIES AND POLYPECTOMY     NERVE SURGERY     '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' during surgery for endometriosis   surgical procedure for endometriosis     TONSILLECTOMY      Family History family history includes Alcohol abuse in her paternal grandfather; Breast cancer in her mother; Diabetes in her maternal grandmother; Emphysema in her father; Heart disease in her paternal grandmother; Hypertension in her mother; Thyroid  disease in her mother.  Social History Social History   Socioeconomic History   Marital status: Divorced    Spouse name: Not on file   Number of children: 0    Years of education: BA   Highest education level: Not on file  Occupational History    Employer: [employer]    Comment: [employer]  Tobacco Use   Smoking status: Former    Current packs/day: 0.00    Average packs/day: 1.5 packs/day for 30.0 years (45.0 ttl pk-yrs)    Types: Cigarettes    Start date: 01/12/1964    Quit date: 01/11/1994    Years since quitting: 30.6   Smokeless tobacco: Never  Substance and Sexual Activity   Alcohol use: Yes    Alcohol/week: 4.0 standard drinks of alcohol    Types: 4 Glasses of wine per week    Comment: moderate    Drug use: No   Sexual activity: Never    Birth control/protection: None  Other Topics Concern   Not on file  Social History Narrative   Patient lives at home with her friend.   Caffeine Use: 1/2 cup daily   Social Drivers of Health   Tobacco Use: Medium Risk (09/03/2024)   Patient History    Smoking Tobacco Use: Former    Smokeless Tobacco Use: Never    Passive Exposure: Not on Actuary Strain: Not on file  Food Insecurity: No Food Insecurity (09/03/2023)   Hunger Vital Sign    Worried About Running Out of Food in the Last Year: Never true    Ran Out of Food in the Last Year: Never true  Transportation Needs: Not on file  Physical Activity: Not on file  Stress: Not on file  Social Connections: Unknown (12/26/2021)   Received from [facility]   Social Network    Social Network: Not on file  Intimate Partner Violence: Unknown (11/17/2021)   Received from [facility]   HITS    Physically Hurt: Not on file    Insult or Talk Down To: Not on file  Threaten Physical Harm: Not on file    Scream or Curse: Not on file  Depression (PHQ2-9): Low Risk (07/03/2024)   Depression (PHQ2-9)    PHQ-2 Score: 1  Alcohol Screen: Not on file  Housing: Not on file  Utilities: Not on file  Health Literacy: Not on file    No results found for: CHOL No results found for: HDL No results found for: LDLCALC No results found for:  TRIG No results found for: [facility] Lab Results  Component Value Date   CREATININE 0.83 07/03/2024   No results found for: GFR    Component Value Date/Time   NA 140 07/03/2024 1402   NA 141 04/01/2017 1523   K 3.8 07/03/2024 1402   K 3.8 04/01/2017 1523   CL 108 07/03/2024 1402   CO2 24 07/03/2024 1402   CO2 25 04/01/2017 1523   GLUCOSE 207 (H) 07/03/2024 1402   GLUCOSE 120 04/01/2017 1523   BUN 15 07/03/2024 1402   BUN 13.6 04/01/2017 1523   CREATININE 0.83 07/03/2024 1402   CREATININE 0.72 03/27/2024 0000   CREATININE 0.8 04/01/2017 1523   CALCIUM  9.2 07/03/2024 1402   CALCIUM  9.5 04/01/2017 1523   PROT 7.2 07/03/2024 1402   PROT 7.3 04/01/2017 1523   ALBUMIN 4.1 07/03/2024 1402   ALBUMIN 3.5 04/01/2017 1523   AST 28 07/03/2024 1402   AST 16 04/01/2017 1523   ALT 30 07/03/2024 1402   ALT 21 04/01/2017 1523   ALKPHOS 84 07/03/2024 1402   ALKPHOS 93 04/01/2017 1523   BILITOT 0.5 07/03/2024 1402   BILITOT <0.22 04/01/2017 1523   GFRNONAA >60 07/03/2024 1402   GFRAA >60 04/19/2020 1114      Latest Ref Rng & Units 07/03/2024    2:02 PM 03/27/2024   12:00 AM 12/17/2023   10:14 AM  BMP  Glucose 70 - 99 mg/dL 792  884  848   BUN 8 - 23 mg/dL 15  12  12    Creatinine 0.44 - 1.00 mg/dL 9.16  9.27  9.00   BUN/Creat Ratio 6 - 22 (calc)  SEE NOTE:    Sodium 135 - 145 mmol/L 140  140  140   Potassium 3.5 - 5.1 mmol/L 3.8  3.9  3.9   Chloride 98 - 111 mmol/L 108  108  107   CO2 22 - 32 mmol/L 24  26  27    Calcium  8.9 - 10.3 mg/dL 9.2  8.8  9.3        Component Value Date/Time   WBC 6.6 07/03/2024 1402   WBC 9.0 11/30/2023 1058   RBC 4.01 07/03/2024 1402   HGB 12.1 07/03/2024 1402   HGB 12.2 04/01/2017 1523   HCT 36.3 07/03/2024 1402   HCT 38.0 04/01/2017 1523   PLT 230 07/03/2024 1402   PLT 326 04/01/2017 1523   MCV 90.5 07/03/2024 1402   MCV 90.0 04/01/2017 1523   MCH 30.2 07/03/2024 1402   MCHC 33.3 07/03/2024 1402   RDW 13.8 07/03/2024 1402   RDW 14.7  (H) 04/01/2017 1523   LYMPHSABS 1.7 07/03/2024 1402   LYMPHSABS 2.1 04/01/2017 1523   MONOABS 0.4 07/03/2024 1402   MONOABS 0.7 04/01/2017 1523   EOSABS 0.3 07/03/2024 1402   EOSABS 0.4 04/01/2017 1523   BASOSABS 0.1 07/03/2024 1402   BASOSABS 0.1 04/01/2017 1523   Lab Results  Component Value Date   TSH 4.43 08/28/2024   TSH 3.94 03/27/2024   FREET4 0.6 (L) 08/28/2024   FREET4 0.5 (  L) 03/27/2024         Parts of this note may have been dictated using voice recognition software. There may be variances in spelling and vocabulary which are unintentional. Not all errors are proofread. Please notify the dino if any discrepancies are noted or if the meaning of any statement is not clear.   "

## 2024-09-03 NOTE — Patient Instructions (Signed)

## 2024-09-10 ENCOUNTER — Encounter: Admitting: Physician Assistant

## 2024-09-14 ENCOUNTER — Encounter: Admitting: Physician Assistant

## 2024-09-16 ENCOUNTER — Other Ambulatory Visit: Payer: Self-pay

## 2024-09-16 ENCOUNTER — Ambulatory Visit: Admitting: Physician Assistant

## 2024-09-16 ENCOUNTER — Encounter: Payer: Self-pay | Admitting: Physician Assistant

## 2024-09-16 DIAGNOSIS — M542 Cervicalgia: Secondary | ICD-10-CM | POA: Diagnosis not present

## 2024-09-16 DIAGNOSIS — M5030 Other cervical disc degeneration, unspecified cervical region: Secondary | ICD-10-CM

## 2024-09-16 DIAGNOSIS — M47812 Spondylosis without myelopathy or radiculopathy, cervical region: Secondary | ICD-10-CM | POA: Diagnosis not present

## 2024-09-16 MED ORDER — PREDNISONE 10 MG PO TABS: 10.0000 mg | ORAL_TABLET | Freq: Every day | ORAL | 0 refills | Status: AC

## 2024-09-16 NOTE — Progress Notes (Signed)
 "  Office Visit Note   Patient: Jessica Roberts           Date of Birth: 04-03-1946           MRN: [id number] Visit Date: 09/16/2024              Requested by: Windy Coy, MD [address] [place],  [place] [address] PCP: Windy Coy, MD  Chief Complaint  Patient presents with   Neck - Pain      HPI: 79 y/o female with 1 month history of neck pain.  She states she just woke up and has had right sided neck pain.  She has been taking ibuprofen 2-3 tablets 1-2 times aday and this has helped.  She also takes hot showers and uses warm compresses that help.  A week ago she was seen by a Chiropractor and she states that helped as well.    She has DM and is managed on Ozempic  and has been instructed to monitor her glucose.    Assessment & Plan: Visit Diagnoses:  1. Neck pain   2. Spondylosis without myelopathy or radiculopathy, cervical region   3. DDD (degenerative disc disease), cervical     Plan: Cervical algia DDD spondylosis Plan for symptomatic treatment and no surgical intervention.  She denies radiacular pain and weakness.   I will prescribe Prednisone  10 mg every day she will keep an eye on her glucose.   Once the pain is improved she will stop the Prednisone . Gentle ROM of the neck  Ice/heat as needed.  Warm showers.   She may revisit a chiropractor as needed.   All instructions and diagnosis was written down for her and she read them back to me in the office.     Follow-Up Instructions: Return if symptoms worsen or fail to improve.   Ortho Exam  Patient is alert, oriented, no adenopathy, well-dressed, normal affect, normal respiratory effort. B UE strength 5/5 equal.  Palpable radial pulses equal B.  Full ROM of B UE inter/external shoulder rotation intact.  Neck ROM intact with discomfort on the right Upper trapezius as well as tenderness to palpation.      Imaging: AP/Lateral cervical x ray C4-5, C5-6, C6-7.  DDD and spondylosis.  Loss of cervical  lordosis    Labs: Lab Results  Component Value Date   HGBA1C 6.6 (A) 09/03/2024   GRAMSTAIN No WBC Seen 10/20/2013   GRAMSTAIN No Squamous Epithelial Cells Seen 10/20/2013   GRAMSTAIN No Organisms Seen 10/20/2013   GRAMSTAIN No WBC Seen 10/20/2013   GRAMSTAIN No Organisms Seen 10/20/2013   LABORGA NO GROWTH 3 DAYS 10/20/2013     Lab Results  Component Value Date   ALBUMIN 4.1 07/03/2024   ALBUMIN 4.2 12/17/2023   ALBUMIN 3.5 11/30/2023    No results found for: MG Lab Results  Component Value Date   VD25OH 40.14 12/20/2021   VD25OH 30.89 12/28/2020   VD25OH 21.36 (L) 01/18/2020    No results found for: PREALBUMIN    Latest Ref Rng & Units 07/03/2024    2:02 PM 12/17/2023   10:14 AM 11/30/2023   11:01 AM  CBC EXTENDED  WBC 4.0 - 10.5 K/uL 6.6  8.1    RBC 3.87 - 5.11 MIL/uL 4.01  4.22    Hemoglobin 12.0 - 15.0 g/dL 87.8  87.3  87.3   HCT 36.0 - 46.0 % 36.3  37.1  37.0   Platelets 150 - 400 K/uL 230  295  NEUT# 1.7 - 7.7 K/uL 4.2  5.7    Lymph# 0.7 - 4.0 K/uL 1.7  1.7       There is no height or weight on file to calculate BMI.  Orders:  Orders Placed This Encounter  Procedures   XR Cervical Spine 2 or 3 views   Meds ordered this encounter  Medications   predniSONE  (DELTASONE ) 10 MG tablet    Sig: Take 1 tablet (10 mg total) by mouth daily with breakfast.    Dispense:  30 tablet    Refill:  0    Supervising Provider:   DUDA, MARCUS V [[id number]]     Procedures: No procedures performed  Clinical Data: No additional findings.  ROS:  All other systems negative, except as noted in the HPI. Review of Systems  Objective: Vital Signs: There were no vitals taken for this visit.  Specialty Comments:  No specialty comments available.  PMFS History: Patient Active Problem List   Diagnosis Date Noted   Recurrent major depression 03/16/2024   Sleep disorder 03/16/2024   Sleep disorder 03/16/2024   Vitamin D  deficiency 03/16/2024   Primary  malignant neoplasm of female breast (HCC) 03/16/2024   Obstructive sleep apnea syndrome 03/16/2024   Chronic bronchitis (HCC) 03/16/2024   Chronic obstructive pulmonary disease, unspecified (HCC) 03/16/2024   Microcytic anemia 03/16/2024   Anxiety disorder 03/16/2024   Allergic rhinitis 03/16/2024   Asthma 03/16/2024   Constipation 03/16/2024   Daytime somnolence 03/16/2024   Depressive disorder 03/16/2024   Diverticulosis of colon 03/16/2024   Gastroesophageal reflux disease 03/16/2024   History of colonic polyps 03/16/2024   Melena 03/16/2024   Non-restorative sleep 03/16/2024   Osteopenia of hip 03/16/2024   Coronary artery disease 03/16/2024   PTSD (post-traumatic stress disorder) 03/16/2024   Chronic intermittent hypoxia with obstructive sleep apnea 02/25/2024   History of obstructive sleep apnea 02/25/2024   Chronic obstructive pulmonary disease (HCC) 02/25/2024   Post traumatic stress disorder (PTSD) 02/25/2024   Early stage nonexudative age-related macular degeneration of both eyes 11/27/2021   Choroidal nevus, right 11/27/2021   Nuclear sclerotic cataract of both eyes 11/27/2021   Iron  deficiency 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 (chronic) 07/19/2020   Breast cancer of upper-outer quadrant of right female breast (HCC) 04/11/2015   Personal history of malignant neoplasm of breast 03/14/2015   Past Medical History:  Diagnosis Date   Allergy    Anxiety    Anxiety and depression    Breast cancer (HCC) 04/07/15   right breast   Breast cancer of upper-outer quadrant of right female breast (HCC) 04/11/2015   Broken nose    Complication of anesthesia    had a very hard time waking up   Dengue fever    Depression    Fall    broken nose   GERD (gastroesophageal reflux disease)    Head injury 1995   concussion in [place], [place] evac to [place] , ear damage with vertigo   Head [place], acute, with loss of consciousness [place]HCC[place]   Head injury, closed, with  concussion 1986   in [place]   Headache    Hypertension    Malaria    PONV (postoperative nausea and vomiting)    Typhoid     Family History  Problem Relation Age of Onset   Hypertension Mother    Thyroid  disease Mother    Breast cancer Mother    Emphysema Father    Diabetes Maternal Grandmother  Heart disease Paternal Grandmother    Alcohol abuse Paternal Grandfather     Past Surgical History:  Procedure Laterality Date   BREAST LUMPECTOMY Right 04/27/2015   BREAST LUMPECTOMY WITH NEEDLE LOCALIZATION AND AXILLARY SENTINEL LYMPH NODE BX Right 04/27/2015   Procedure: RIGHT BREAST LUMPECTOMY WITH  TWO NEEDLE LOCALIZATION AND TWO RIGHT AXILLARY SENTINEL LYMPH NODE BX;  Surgeon: Elon Pacini, MD;  Location: [facility] OR;  Service: General;  Laterality: Right;   COLONOSCOPY W/ BIOPSIES AND POLYPECTOMY     NERVE SURGERY     '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' during surgery for endometriosis   surgical procedure for endometriosis     TONSILLECTOMY     Social History   Occupational History    Employer: [employer]    Comment: [employer]  Tobacco Use   Smoking status: Former    Current packs/day: 0.00    Average packs/day: 1.5 packs/day for 30.0 years (45.0 ttl pk-yrs)    Types: Cigarettes    Start date: 01/12/1964    Quit date: 01/11/1994    Years since quitting: 30.7   Smokeless tobacco: Never  Substance and Sexual Activity   Alcohol use: Yes    Alcohol/week: 4.0 standard drinks of alcohol    Types: 4 Glasses of wine per week    Comment: moderate    Drug use: No   Sexual activity: Never    Birth control/protection: None       "

## 2024-11-11 ENCOUNTER — Other Ambulatory Visit

## 2024-11-19 ENCOUNTER — Ambulatory Visit: Admitting: "Endocrinology

## 2025-04-27 ENCOUNTER — Ambulatory Visit: Admitting: Neurosurgery
